# Patient Record
Sex: Female | Born: 1980 | Race: White | Hispanic: No | Marital: Married | Smoking: Never smoker
Health system: Southern US, Community
[De-identification: ages and names within clinical notes are randomized; demographics above are authoritative.]

## PROBLEM LIST (undated history)

## (undated) DIAGNOSIS — F32A Depression, unspecified: Secondary | ICD-10-CM

## (undated) DIAGNOSIS — G4733 Obstructive sleep apnea (adult) (pediatric): Secondary | ICD-10-CM

## (undated) DIAGNOSIS — M797 Fibromyalgia: Secondary | ICD-10-CM

## (undated) DIAGNOSIS — F419 Anxiety disorder, unspecified: Secondary | ICD-10-CM

## (undated) DIAGNOSIS — Z9989 Dependence on other enabling machines and devices: Secondary | ICD-10-CM

## (undated) DIAGNOSIS — I10 Essential (primary) hypertension: Secondary | ICD-10-CM

## (undated) DIAGNOSIS — G4711 Idiopathic hypersomnia with long sleep time: Secondary | ICD-10-CM

## (undated) DIAGNOSIS — E039 Hypothyroidism, unspecified: Secondary | ICD-10-CM

## (undated) DIAGNOSIS — D649 Anemia, unspecified: Secondary | ICD-10-CM

## (undated) DIAGNOSIS — H409 Unspecified glaucoma: Secondary | ICD-10-CM

## (undated) DIAGNOSIS — R7309 Other abnormal glucose: Secondary | ICD-10-CM

## (undated) DIAGNOSIS — O1490 Unspecified pre-eclampsia, unspecified trimester: Secondary | ICD-10-CM

## (undated) DIAGNOSIS — B009 Herpesviral infection, unspecified: Secondary | ICD-10-CM

## (undated) DIAGNOSIS — K219 Gastro-esophageal reflux disease without esophagitis: Secondary | ICD-10-CM

## (undated) DIAGNOSIS — F329 Major depressive disorder, single episode, unspecified: Secondary | ICD-10-CM

## (undated) HISTORY — DX: Anemia, unspecified: D64.9

## (undated) HISTORY — PX: TONSILLECTOMY: SUR1361

## (undated) HISTORY — DX: Unspecified glaucoma: H40.9

## (undated) HISTORY — PX: WISDOM TOOTH EXTRACTION: SHX21

## (undated) HISTORY — DX: Essential (primary) hypertension: I10

## (undated) HISTORY — DX: Obstructive sleep apnea (adult) (pediatric): G47.33

## (undated) HISTORY — DX: Idiopathic hypersomnia with long sleep time: G47.11

## (undated) HISTORY — DX: Major depressive disorder, single episode, unspecified: F32.9

## (undated) HISTORY — DX: Hypothyroidism, unspecified: E03.9

## (undated) HISTORY — DX: Gastro-esophageal reflux disease without esophagitis: K21.9

## (undated) HISTORY — DX: Other abnormal glucose: R73.09

## (undated) HISTORY — DX: Unspecified pre-eclampsia, unspecified trimester: O14.90

## (undated) HISTORY — DX: Fibromyalgia: M79.7

## (undated) HISTORY — PX: OTHER SURGICAL HISTORY: SHX169

## (undated) HISTORY — DX: Anxiety disorder, unspecified: F41.9

## (undated) HISTORY — DX: Dependence on other enabling machines and devices: Z99.89

## (undated) HISTORY — DX: Depression, unspecified: F32.A

## (undated) HISTORY — DX: Herpesviral infection, unspecified: B00.9

---

## 2001-04-10 ENCOUNTER — Encounter: Payer: Self-pay | Admitting: Family Medicine

## 2001-04-10 ENCOUNTER — Encounter: Admission: RE | Admit: 2001-04-10 | Discharge: 2001-04-10 | Payer: Self-pay | Admitting: Family Medicine

## 2001-07-04 ENCOUNTER — Other Ambulatory Visit: Admission: RE | Admit: 2001-07-04 | Discharge: 2001-07-04 | Payer: Self-pay | Admitting: Obstetrics and Gynecology

## 2003-08-26 ENCOUNTER — Other Ambulatory Visit: Admission: RE | Admit: 2003-08-26 | Discharge: 2003-08-26 | Payer: Self-pay | Admitting: Obstetrics and Gynecology

## 2004-08-15 ENCOUNTER — Emergency Department (HOSPITAL_COMMUNITY): Admission: EM | Admit: 2004-08-15 | Discharge: 2004-08-15 | Payer: Self-pay | Admitting: Emergency Medicine

## 2004-10-21 ENCOUNTER — Other Ambulatory Visit: Admission: RE | Admit: 2004-10-21 | Discharge: 2004-10-21 | Payer: Self-pay | Admitting: Obstetrics and Gynecology

## 2004-11-21 HISTORY — PX: CHOLECYSTECTOMY: SHX55

## 2005-07-11 ENCOUNTER — Inpatient Hospital Stay (HOSPITAL_COMMUNITY): Admission: AD | Admit: 2005-07-11 | Discharge: 2005-07-13 | Payer: Self-pay | Admitting: Obstetrics and Gynecology

## 2005-07-18 ENCOUNTER — Inpatient Hospital Stay (HOSPITAL_COMMUNITY): Admission: AD | Admit: 2005-07-18 | Discharge: 2005-07-21 | Payer: Self-pay | Admitting: Obstetrics and Gynecology

## 2005-07-28 ENCOUNTER — Inpatient Hospital Stay (HOSPITAL_COMMUNITY): Admission: AD | Admit: 2005-07-28 | Discharge: 2005-07-31 | Payer: Self-pay | Admitting: Obstetrics and Gynecology

## 2005-08-04 ENCOUNTER — Encounter: Admission: RE | Admit: 2005-08-04 | Discharge: 2005-08-04 | Payer: Self-pay | Admitting: Obstetrics and Gynecology

## 2005-08-06 ENCOUNTER — Inpatient Hospital Stay (HOSPITAL_COMMUNITY): Admission: AD | Admit: 2005-08-06 | Discharge: 2005-08-07 | Payer: Self-pay | Admitting: Obstetrics and Gynecology

## 2005-09-04 ENCOUNTER — Emergency Department (HOSPITAL_COMMUNITY): Admission: EM | Admit: 2005-09-04 | Discharge: 2005-09-05 | Payer: Self-pay | Admitting: Family Medicine

## 2005-09-28 ENCOUNTER — Encounter (INDEPENDENT_AMBULATORY_CARE_PROVIDER_SITE_OTHER): Payer: Self-pay | Admitting: General Surgery

## 2005-09-28 ENCOUNTER — Observation Stay (HOSPITAL_COMMUNITY): Admission: RE | Admit: 2005-09-28 | Discharge: 2005-09-29 | Payer: Self-pay | Admitting: General Surgery

## 2005-10-24 ENCOUNTER — Other Ambulatory Visit: Admission: RE | Admit: 2005-10-24 | Discharge: 2005-10-24 | Payer: Self-pay | Admitting: Obstetrics and Gynecology

## 2006-03-21 ENCOUNTER — Other Ambulatory Visit: Admission: RE | Admit: 2006-03-21 | Discharge: 2006-03-21 | Payer: Self-pay | Admitting: Obstetrics and Gynecology

## 2009-01-18 ENCOUNTER — Inpatient Hospital Stay (HOSPITAL_COMMUNITY): Admission: AD | Admit: 2009-01-18 | Discharge: 2009-01-18 | Payer: Self-pay | Admitting: Obstetrics and Gynecology

## 2009-04-27 ENCOUNTER — Inpatient Hospital Stay (HOSPITAL_COMMUNITY): Admission: AD | Admit: 2009-04-27 | Discharge: 2009-04-27 | Payer: Self-pay | Admitting: Obstetrics and Gynecology

## 2009-05-17 ENCOUNTER — Inpatient Hospital Stay (HOSPITAL_COMMUNITY): Admission: AD | Admit: 2009-05-17 | Discharge: 2009-05-22 | Payer: Self-pay | Admitting: Obstetrics and Gynecology

## 2009-05-27 ENCOUNTER — Encounter: Payer: Self-pay | Admitting: Emergency Medicine

## 2009-05-27 ENCOUNTER — Inpatient Hospital Stay (HOSPITAL_COMMUNITY): Admission: AD | Admit: 2009-05-27 | Discharge: 2009-05-29 | Payer: Self-pay | Admitting: Obstetrics and Gynecology

## 2009-09-12 ENCOUNTER — Emergency Department (HOSPITAL_COMMUNITY): Admission: EM | Admit: 2009-09-12 | Discharge: 2009-09-12 | Payer: Self-pay | Admitting: Emergency Medicine

## 2009-09-15 ENCOUNTER — Encounter: Admission: RE | Admit: 2009-09-15 | Discharge: 2009-09-15 | Payer: Self-pay | Admitting: Family Medicine

## 2010-07-28 ENCOUNTER — Emergency Department (HOSPITAL_COMMUNITY): Admission: EM | Admit: 2010-07-28 | Discharge: 2010-07-28 | Payer: Self-pay | Admitting: Emergency Medicine

## 2010-08-18 ENCOUNTER — Emergency Department (HOSPITAL_COMMUNITY)
Admission: EM | Admit: 2010-08-18 | Discharge: 2010-08-18 | Payer: Self-pay | Source: Home / Self Care | Admitting: Emergency Medicine

## 2011-02-03 LAB — RAPID URINE DRUG SCREEN, HOSP PERFORMED
Amphetamines: NOT DETECTED
Barbiturates: NOT DETECTED
Benzodiazepines: NOT DETECTED

## 2011-02-03 LAB — URINALYSIS, ROUTINE W REFLEX MICROSCOPIC
Bilirubin Urine: NEGATIVE
Glucose, UA: NEGATIVE mg/dL
Hgb urine dipstick: NEGATIVE
Ketones, ur: NEGATIVE mg/dL
pH: 7.5 (ref 5.0–8.0)

## 2011-02-03 LAB — POCT PREGNANCY, URINE: Preg Test, Ur: NEGATIVE

## 2011-02-03 LAB — POCT I-STAT, CHEM 8: Creatinine, Ser: 0.7 mg/dL (ref 0.4–1.2)

## 2011-02-24 LAB — DIFFERENTIAL
Basophils Absolute: 0 10*3/uL (ref 0.0–0.1)
Eosinophils Absolute: 0 10*3/uL (ref 0.0–0.7)
Eosinophils Relative: 0 % (ref 0–5)
Lymphocytes Relative: 39 % (ref 12–46)
Lymphs Abs: 2.6 10*3/uL (ref 0.7–4.0)
Monocytes Absolute: 0.7 10*3/uL (ref 0.1–1.0)
Monocytes Relative: 11 % (ref 3–12)
Neutro Abs: 3.3 10*3/uL (ref 1.7–7.7)
Neutrophils Relative %: 49 % (ref 43–77)

## 2011-02-24 LAB — COMPREHENSIVE METABOLIC PANEL
Albumin: 3.7 g/dL (ref 3.5–5.2)
Alkaline Phosphatase: 57 U/L (ref 39–117)
BUN: 11 mg/dL (ref 6–23)
Chloride: 107 mEq/L (ref 96–112)
Glucose, Bld: 92 mg/dL (ref 70–99)
Potassium: 3.9 mEq/L (ref 3.5–5.1)
Total Bilirubin: 0.6 mg/dL (ref 0.3–1.2)

## 2011-02-24 LAB — POCT CARDIAC MARKERS
CKMB, poc: <1 ng/mL — ABNORMAL LOW (ref 1.0–8.0)
Troponin i, poc: <0.05 ng/mL (ref 0.00–0.09)

## 2011-02-24 LAB — URINALYSIS, ROUTINE W REFLEX MICROSCOPIC
Bilirubin Urine: NEGATIVE
Hgb urine dipstick: NEGATIVE
Ketones, ur: NEGATIVE mg/dL
Protein, ur: NEGATIVE mg/dL
Specific Gravity, Urine: 1.02 (ref 1.005–1.030)
Urobilinogen, UA: 0.2 mg/dL (ref 0.0–1.0)

## 2011-02-24 LAB — CBC
HCT: 40.4 % (ref 36.0–46.0)
RBC: 4.84 MIL/uL (ref 3.87–5.11)

## 2011-02-27 LAB — HEPATIC FUNCTION PANEL
ALT: 19 U/L (ref 0–35)
Albumin: 2.7 g/dL — ABNORMAL LOW (ref 3.5–5.2)
Indirect Bilirubin: 0.2 mg/dL — ABNORMAL LOW (ref 0.3–0.9)
Total Protein: 5.6 g/dL — ABNORMAL LOW (ref 6.0–8.3)

## 2011-02-27 LAB — COMPREHENSIVE METABOLIC PANEL
ALT: 18 U/L (ref 0–35)
ALT: 19 U/L (ref 0–35)
Alkaline Phosphatase: 103 U/L (ref 39–117)
Alkaline Phosphatase: 94 U/L (ref 39–117)
BUN: 6 mg/dL (ref 6–23)
CO2: 25 mEq/L (ref 19–32)
CO2: 29 mEq/L (ref 19–32)
Calcium: 6.7 mg/dL — ABNORMAL LOW (ref 8.4–10.5)
Chloride: 101 mEq/L (ref 96–112)
Creatinine, Ser: 0.6 mg/dL (ref 0.4–1.2)
GFR calc non Af Amer: >60 mL/min (ref >60)
Glucose, Bld: 90 mg/dL (ref 70–99)
Potassium: 3.4 mEq/L — ABNORMAL LOW (ref 3.5–5.1)

## 2011-02-27 LAB — CBC
HCT: 30 % — ABNORMAL LOW (ref 36.0–46.0)
HCT: 32.7 % — ABNORMAL LOW (ref 36.0–46.0)
HCT: 34.1 % — ABNORMAL LOW (ref 36.0–46.0)
HCT: 35.3 % — ABNORMAL LOW (ref 36.0–46.0)
Hemoglobin: 12.4 g/dL (ref 12.0–15.0)
MCHC: 34.7 g/dL (ref 30.0–36.0)
MCHC: 35 g/dL (ref 30.0–36.0)
MCV: 88.5 fL (ref 78.0–100.0)
MCV: 90 fL (ref 78.0–100.0)
Platelets: 250 10*3/uL (ref 150–400)
Platelets: 269 10*3/uL (ref 150–400)
RBC: 3.33 MIL/uL — ABNORMAL LOW (ref 3.87–5.11)
RBC: 3.81 MIL/uL — ABNORMAL LOW (ref 3.87–5.11)
RDW: 13.5 % (ref 11.5–15.5)
RDW: 13.9 % (ref 11.5–15.5)
WBC: 7.3 10*3/uL (ref 4.0–10.5)
WBC: 9.1 10*3/uL (ref 4.0–10.5)

## 2011-02-27 LAB — BASIC METABOLIC PANEL
BUN: 12 mg/dL (ref 6–23)
Calcium: 8.5 mg/dL (ref 8.4–10.5)
Creatinine, Ser: 0.84 mg/dL (ref 0.4–1.2)
GFR calc non Af Amer: >60 mL/min (ref >60)
Glucose, Bld: 97 mg/dL (ref 70–99)
Potassium: 3.6 mEq/L (ref 3.5–5.1)

## 2011-02-27 LAB — DIFFERENTIAL
Basophils Absolute: 0 10*3/uL (ref 0.0–0.1)
Eosinophils Absolute: 0.2 10*3/uL (ref 0.0–0.7)
Eosinophils Relative: 3 % (ref 0–5)
Lymphocytes Relative: 27 % (ref 12–46)
Neutrophils Relative %: 64 % (ref 43–77)

## 2011-02-27 LAB — URINALYSIS, ROUTINE W REFLEX MICROSCOPIC
Bilirubin Urine: NEGATIVE
Ketones, ur: NEGATIVE mg/dL
Nitrite: NEGATIVE
Urobilinogen, UA: 0.2 mg/dL (ref 0.0–1.0)

## 2011-02-27 LAB — CCBB MATERNAL DONOR DRAW

## 2011-02-27 LAB — URIC ACID
Uric Acid, Serum: 5 mg/dL (ref 2.4–7.0)
Uric Acid, Serum: 5 mg/dL (ref 2.4–7.0)

## 2011-02-27 LAB — LACTATE DEHYDROGENASE
LDH: 202 U/L (ref 94–250)
LDH: 202 U/L (ref 94–250)

## 2011-02-27 LAB — MAGNESIUM: Magnesium: 6.5 mg/dL (ref 1.5–2.5)

## 2011-02-27 LAB — TSH: TSH: 3.123 u[IU]/mL (ref 0.350–4.500)

## 2011-02-27 LAB — APTT: aPTT: 30 seconds (ref 24–37)

## 2011-02-27 LAB — PROTEIN, URINE, 24 HOUR
Protein, 24H Urine: 432 mg/d — ABNORMAL HIGH (ref 50–100)
Protein, Urine: 9 mg/dL

## 2011-02-27 LAB — PROTIME-INR: Prothrombin Time: 12.1 seconds (ref 11.6–15.2)

## 2011-02-28 ENCOUNTER — Other Ambulatory Visit (HOSPITAL_COMMUNITY): Payer: Self-pay | Admitting: Internal Medicine

## 2011-02-28 ENCOUNTER — Ambulatory Visit (HOSPITAL_COMMUNITY)
Admission: RE | Admit: 2011-02-28 | Discharge: 2011-02-28 | Disposition: A | Discharge status: Home / Self Care | Payer: BLUE CROSS/BLUE SHIELD | Source: Ambulatory Visit | Attending: Internal Medicine | Admitting: Internal Medicine

## 2011-02-28 DIAGNOSIS — R5381 Other malaise: Secondary | ICD-10-CM | POA: Insufficient documentation

## 2011-02-28 DIAGNOSIS — R109 Unspecified abdominal pain: Secondary | ICD-10-CM | POA: Insufficient documentation

## 2011-02-28 DIAGNOSIS — R112 Nausea with vomiting, unspecified: Secondary | ICD-10-CM | POA: Insufficient documentation

## 2011-02-28 LAB — COMPREHENSIVE METABOLIC PANEL
ALT: 11 U/L (ref 0–35)
ALT: 9 U/L (ref 0–35)
ALT: 9 U/L (ref 0–35)
AST: 15 U/L (ref 0–37)
AST: 15 U/L (ref 0–37)
Albumin: 2.2 g/dL — ABNORMAL LOW (ref 3.5–5.2)
Alkaline Phosphatase: 101 U/L (ref 39–117)
CO2: 24 mEq/L (ref 19–32)
CO2: 26 mEq/L (ref 19–32)
Calcium: 8.5 mg/dL (ref 8.4–10.5)
Calcium: 9.5 mg/dL (ref 8.4–10.5)
Chloride: 107 mEq/L (ref 96–112)
Creatinine, Ser: 0.61 mg/dL (ref 0.4–1.2)
GFR calc Af Amer: >60 mL/min (ref >60)
GFR calc Af Amer: >60 mL/min (ref >60)
GFR calc non Af Amer: >60 mL/min (ref >60)
GFR calc non Af Amer: >60 mL/min (ref >60)
Glucose, Bld: 100 mg/dL — ABNORMAL HIGH (ref 70–99)
Potassium: 3.1 mEq/L — ABNORMAL LOW (ref 3.5–5.1)
Sodium: 139 mEq/L (ref 135–145)
Sodium: 140 mEq/L (ref 135–145)
Total Protein: 4.8 g/dL — ABNORMAL LOW (ref 6.0–8.3)
Total Protein: 5.1 g/dL — ABNORMAL LOW (ref 6.0–8.3)

## 2011-02-28 LAB — CBC
HCT: 32.9 % — ABNORMAL LOW (ref 36.0–46.0)
HCT: 33.7 % — ABNORMAL LOW (ref 36.0–46.0)
Hemoglobin: 11 g/dL — ABNORMAL LOW (ref 12.0–15.0)
Hemoglobin: 11.6 g/dL — ABNORMAL LOW (ref 12.0–15.0)
Hemoglobin: 11.8 g/dL — ABNORMAL LOW (ref 12.0–15.0)
MCHC: 34.7 g/dL (ref 30.0–36.0)
MCHC: 34.9 g/dL (ref 30.0–36.0)
MCHC: 35 g/dL (ref 30.0–36.0)
MCHC: 35.1 g/dL (ref 30.0–36.0)
MCV: 88.9 fL (ref 78.0–100.0)
MCV: 89 fL (ref 78.0–100.0)
Platelets: 107 10*3/uL — ABNORMAL LOW (ref 150–400)
Platelets: 110 10*3/uL — ABNORMAL LOW (ref 150–400)
RBC: 3.58 MIL/uL — ABNORMAL LOW (ref 3.87–5.11)
RBC: 3.79 MIL/uL — ABNORMAL LOW (ref 3.87–5.11)
RDW: 13.7 % (ref 11.5–15.5)
RDW: 13.8 % (ref 11.5–15.5)
RDW: 14.3 % (ref 11.5–15.5)
WBC: 8 10*3/uL (ref 4.0–10.5)

## 2011-02-28 LAB — URINALYSIS, ROUTINE W REFLEX MICROSCOPIC
Bilirubin Urine: NEGATIVE
Hgb urine dipstick: NEGATIVE
Nitrite: NEGATIVE
Specific Gravity, Urine: 1.01 (ref 1.005–1.030)
Urobilinogen, UA: 0.2 mg/dL (ref 0.0–1.0)
pH: 7.5 (ref 5.0–8.0)

## 2011-02-28 LAB — LACTATE DEHYDROGENASE: LDH: 103 U/L (ref 94–250)

## 2011-02-28 LAB — PLATELET COUNT: Platelets: 107 10*3/uL — ABNORMAL LOW (ref 150–400)

## 2011-02-28 LAB — URIC ACID: Uric Acid, Serum: 4.7 mg/dL (ref 2.4–7.0)

## 2011-02-28 LAB — WET PREP, GENITAL: Yeast Wet Prep HPF POC: NONE SEEN

## 2011-02-28 LAB — URINE MICROSCOPIC-ADD ON: RBC / HPF: NONE SEEN RBC/hpf (ref <3)

## 2011-02-28 LAB — CREATININE CLEARANCE, URINE, 24 HOUR
Collection Interval-CRCL: 24 hours
Creatinine Clearance: 166 mL/min — ABNORMAL HIGH (ref 75–115)
Creatinine, 24H Ur: 1480 mg/d (ref 700–1800)
Creatinine, Urine: 59.8 mg/dL

## 2011-03-04 ENCOUNTER — Other Ambulatory Visit (HOSPITAL_COMMUNITY): Payer: Self-pay | Admitting: Obstetrics and Gynecology

## 2011-03-04 DIAGNOSIS — R102 Pelvic and perineal pain: Secondary | ICD-10-CM

## 2011-03-07 ENCOUNTER — Ambulatory Visit (HOSPITAL_COMMUNITY): Admission: RE | Admit: 2011-03-07 | Payer: BLUE CROSS/BLUE SHIELD | Source: Ambulatory Visit

## 2011-03-07 ENCOUNTER — Ambulatory Visit (HOSPITAL_COMMUNITY)
Admission: RE | Admit: 2011-03-07 | Discharge: 2011-03-07 | Disposition: A | Discharge status: Home / Self Care | Payer: BLUE CROSS/BLUE SHIELD | Source: Ambulatory Visit | Attending: Obstetrics and Gynecology | Admitting: Obstetrics and Gynecology

## 2011-03-07 DIAGNOSIS — N949 Unspecified condition associated with female genital organs and menstrual cycle: Secondary | ICD-10-CM | POA: Insufficient documentation

## 2011-03-07 DIAGNOSIS — R102 Pelvic and perineal pain: Secondary | ICD-10-CM

## 2011-03-07 DIAGNOSIS — N838 Other noninflammatory disorders of ovary, fallopian tube and broad ligament: Secondary | ICD-10-CM | POA: Insufficient documentation

## 2011-03-08 LAB — WET PREP, GENITAL

## 2011-04-05 NOTE — H&P (Signed)
Lauren [name], Lauren [name]              ACCOUNT NO.:  [id number]   MEDICAL RECORD NO.:  [id number]          PATIENT TYPE:  INP   LOCATION:  9373                          FACILITY:  [facility]   PHYSICIAN:  Osborn Coho, M.D.   DATE OF BIRTH:  10-22-1981   DATE OF ADMISSION:  05/27/2009  DATE OF DISCHARGE:                              HISTORY & PHYSICAL   Lauren [name] is a 30 year old gravida 2, para 2 seven days postpartum  who is being readmitted to treat possible preeclampsia and rule out  seizures related to a syncopal episode this morning.  This is a 23 hour  observation admission and she will be receiving magnesium sulfate  infusion and a 24 hour urine collection for protein.  Her obstetrical  history includes a previous delivery requiring readmission and treatment  of preeclampsia postpartum and a term induction of labor with this  pregnancy at 37-1/2 weeks secondary to [facility] and thrombocytopenia.  Relevant labs for today's admission include a normal preeclampsia panel  that was done at [facility] prior to transport to [facility] and a  repeat preeclampsia panel that was also normal with a white blood cell  count of 9.1, hemoglobin from 10.9 on her discharge a week ago, a  hemoglobin of 11.6 which was a change from 10.4 a week ago on discharge,  a hematocrit of 34.1 and a platelet count of 271,000 which was a change  from 107,000 a week ago.  Her potassium today is 3.7, SGOT 22, SGPT 19,  LDH 202, uric acid 5.0.  Lauren [name] also had a negative chest x-ray  and negative EKG at [facility] this morning and she is being scheduled  for a CT of the head and a spiral CT of the chest to rule out pulmonary  embolism..  Her vital signs today include temperature of 97.5 upon  arrival, pulse 56, respiratory rate 18, oxygen saturation on room air  98% and blood pressures running from the 110s up to 140s over as low as  59 up to 90s.   HISTORY OF PRESENT ILLNESS:  Lauren [name] states she got up  in the  middle of the night to feed the baby and got up to prepare a bottle and  came back into her room and sat down on her bed and then blacked out.  Her husband found her unresponsive.  She said she could hear him but she  could not respond, she could not speak and she could not move her  extremities and this lasted for approximately 30 seconds.  He took her  blood pressure and found it to be 203/100 I believe and he called the  ambulance and she was transported to [facility] where she had an initial  evaluation for preeclampsia and then transported over to [facility]  where she is having ongoing evaluation for atypical preeclampsia and  investigation of her syncopal episodes.   ALLERGIES:  Lauren [name] has no known drug allergies.  She does have a  questionable allergy to LATEX.  She has no known food allergies.   CURRENT MEDICATIONS:  1.  Prenatal vitamin.  2. Vitamin C 1000 mg a day.  3. Prozac 20 mg daily.  4. Levothyroxine 25 mcg daily.  5. Colace as needed.   PAST MEDICAL HISTORY:  She has a history of depression and is currently  on Prozac.  She has a history of fibromyalgia and is currently not on  any medication for that.  States that it has been under control during  this pregnancy.  She had a history of low thyroid and is on medication  for that.  She also has a history of ASCUS Pap and a history of  preeclampsia with the previous pregnancy.  Her previous pregnancy  resulted in the birth of a daughter at 21 weeks in August of 2006 and  current pregnancy resulted in the birth of a second daughter at 37-1/2  weeks on June 30 of this year who weighed 6 pounds 13 ounces with Apgar  scores of 6 and 9 at one and five minutes respectively.  She was  admitted with thrombocytopenia and upon discharge her platelets were  stable at 107,000.   FAMILY HISTORY:  Includes maternal grandmother and maternal aunt with  heart disease.  Maternal aunt and maternal grandmother with   hypertension.  Maternal grandmother, mother, maternal aunt, maternal  first cousin with diabetes.  Mother with hypothyroidism.  Maternal aunt  and maternal first cousin also with hypothyroid.  The patient's brother  has renal disease.  Maternal grandfather had lung and skin cancer.  Maternal grandmother had skin cancer.  Her mother has had ovarian  cancer.  Her brother and her mother are bipolar.   SOCIAL HISTORY:  The patient is married to husband, [name].  They are both Caucasian.  They are of the [place] faith.  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  Her  husband has a high school diploma and works as a Psychologist, occupational.  She does not  smoke, drink or use any street drugs.   PHYSICAL ASSESSMENT:  Upon admission was within normal limits.  She is  alert and oriented x3.  She states her headache was reduced to 1/10 on a  10 point scale having been 3 upon arrival.  The chest pain that she was  feeling when she first was taken away by the ambulance is resolved and  she does state that she gets chest pain with anxiety.  She has minimal  edema of her extremities.  Her DTRs are +1 to +2 bilaterally.  She has  negative Homans.  Her lungs are clear to auscultation.  Her breasts are  full, she has been pumping and feeding her baby with a bottle.  Her  nipples are intact.  Her abdomen is soft.  Her fundus is firm below the  umbilicus.  Her perineum and vulva are intact.  Her lochia is moderate  to light.  Her vital signs were previously mentioned in the lab section.   ASSESSMENT:  A 30 year old gravida 2, para 2 seven days post spontaneous  vaginal delivery, syncope, ongoing depression with management by Prozac,  history of thrombocytopenia resolved, history of fibromyalgia stable,  history of hypothyroid, stable on medication, history of preeclampsia.   PLAN:  Per Dr. Normand Sloop admit for 23 hour observation, 24 hour urine  collection, CT of the head, spiral CT  of the chest to rule out pulmonary  embolism.  Pending those results either an inpatient or outpatient neuro  consult as indicated.  Repeat labs in  the a.m. including CBC, basic  metabolic panel, magnesium level, routine pain medication.  Continue  current medications for thyroid and depression.  Continuous pulse  oximetry and reevaluate after results of a head CT, spiral CT, repeat  labs and 24 hour urine results.      Eulogio Bear, CNM      Osborn Coho, M.D.  Electronically Signed    JM/MEDQ  D:  05/27/2009  T:  05/27/2009  Job:  [id number]

## 2011-04-05 NOTE — H&P (Signed)
[name], [name]              ACCOUNT NO.:  [id number]   MEDICAL RECORD NO.:  [id number]          PATIENT TYPE:  INP   LOCATION:  9172                          [facility]ILITY:  [facility]   PHYSICIAN:  Hal Morales, M.D.DATE OF BIRTH:  Dec 05, 1980   DATE OF ADMISSION:  05/17/2009  DATE OF DISCHARGE:                              HISTORY & PHYSICAL   Lauren [name] is a 30 year old gravida 2, para 1-0-0-1 at 37-2/7 weeks  who presented complaining of temporal headache, visual symptoms, and  feeling bad today with a blood pressure of 150/100 at home.  She also  now complains of mild epigastric and reflux-type pain.  She reports  positive fetal movement.  Denies uterine contractions.  Reports headache  is 3/10 to 4/10.   PREGNANCIES:  1. History of preeclampsia with her last pregnancy.  2. Positive group B strep.  3. Hypothyroidism.  4. Fibromyalgia.  5. History of depression.  6. Atypical squamous cells on Pap smear in 2008 with normal Pap smear      in February 2009.   While in the maternity admissions unit, the patient was noted to have  blood pressures in the 120-140s over 73-99, and platelet count was  126,000.  She was, therefore, admitted for observation and to initiate a  24 hours urine.   PRENATAL LABS:  Blood type is B positive.  Rh antibody negative.  Rubella titer positive.  Hepatitis B surface antigen negative.  HIV  nonreactive.  TSH normal at 4.437 in the first trimester, GC and  chlamydia negative, Pap smear normal in February.  Platelet count on new  OB visit in December was 200.  Hemoglobin upon entering the practice was  12.9.  It was 11.5 at 28 weeks.  TSH at that time was 2.703, RPR was  nonreactive with Glucola 130.  Group B strep culture was positive at 36  weeks.  Cultures were negative.  The patient declined first trimester  screen.  She declined AFP.   HISTORY OF PRESENT PREGNANCY:  The patient entered care at approximately  7 weeks.  She underwent  ultrasound at that time for viability with an  [facility] of June 05, 2009 established.  She does have a history of  fibromyalgia.  She also had spotting with early pregnancy.  TSH was 4.4  at her first visit.  She declined first trimester screen.  She had  cramping at 16 weeks.  She had another ultrasound at 19 weeks showing  normal growth and an anterior placenta.  Pap smear was normal.  Maternity admissions unit at approximately 22 weeks for question of  leak.  No issues were noted.  She had a Glucola that was normal at 30.  She had some back pain at 25 weeks.  It was probably sciatica.  She was  referred to PT at that time.  Fetal fibrinogen done at 27 weeks for some  cramping.  This was negative.  Hemoglobin was 11.5 at 23 weeks, TSH  2.703, RPR nonreactive.  GC and chlamydia cultures negative at 34 weeks.  Fetal fibrinogen was also negative at that time.  She was seen at  maternity admissions unit on June 8 for questionable leaking.  She had  an ultrasound at that time showing normal fluid and no other issues.  Her blood pressure has been normotensive throughout this whole  pregnancy.   OB HISTORY:  In 2006 she had the vaginal birth of a female infant weight  6 pounds 6 ounces at 37-1/7 weeks.  She was in labor 7-1/2 hours.  She  had epidural anesthesia.  By 37 weeks, last pregnancy, she was 3 cm.  She was delivered by Dr. Estanislado Pandy and required magnesium postpartum and a  readmission for preeclampsia.  She did have first trimester bleeding in  that pregnancy.   MEDICAL HISTORY:  She reports previous abnormal Pap smear in 2008.  Repeat was normal.  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  She has been on Ortho Tri-Cyclen and oral contraceptive user.  She has a  history of hypothyroidism and has been on levothyroxine.  She has a  history of constipation and occasional UTI.   SURGICAL HISTORY:  Includes wisdom teeth and a 2006 cholecystectomy.  She was hospitalized for 1 week  post delivery following the last birth.  She had preeclampsia.  The patient has no known drug allergies.  Back in  college, she had 1 episode of sensitivity to latex, but no other  problems, and has had no problems since.   FAMILY HISTORY:  Maternal grandmother and maternal aunt have heart  disease.  Maternal aunt and maternal grandmother have hypertension.  Maternal grandmother, mother, maternal aunt, maternal first cousin have  diabetes.  Her mother has hypothyroidism.  Maternal aunt and maternal  first cousin are also hypothyroid.  The patient's brother has renal  disease.  Maternal grandfather had lung and skin cancer.  Maternal  grandmother had skin cancer.  Her mother has had ovarian cancer.  Her  brother and her mother are bipolar.   GENETIC HISTORY:  Remarkable for the patient's maternal aunt born  missing 3 of her fingers.   SOCIAL HISTORY:  The patient is married to the father of the baby.  He  is involved and supportive.  His name is [employer]  The patient is  Caucasian of the [place] faith.  She has a bachelor's degree.  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  Her husband has a high school diploma.  He is a Psychologist, occupational.  She has been followed by the certified nurse midwife  service at [facility].  She denies any alcohol, drug, or  tobacco use during this pregnancy.   PHYSICAL EXAM:  VITAL SIGNS:  Blood pressures today were 127-141 over 73-  99.  max was 141/99.  Pulse was 81-107.  Respirations 20.  The patient  is afebrile.  HEENT:  Within normal limits.  Pupils equal, round, and reactive to  light.  HEART:  Regular rate and rhythm without murmur.  BREASTS:  Soft and nontender.  ABDOMEN:  Gravid and nontender, although the patient does complain of  some mild upper abdominal soreness.  Fetal heart rate is reactive with  no declarations and no contractions.  Cervix is closed, long, vertex -3.  There is positive vertex to Leopold's as well.  EXTREMITIES:   Deep tendon reflexes are 2+ without clonus.  There is 1+  edema noted.   LABORATORY STUDIES:  Hemoglobin is 11.8, white blood cell count 8.8,  platelet count 126,000.  Urinalysis shows specific gravity 1.010 and  negative protein.  Comprehensive metabolic panel is within normal  limits.  SGOT is 18, SGPT 9, LDH 103, uric acid 4.7.   IMPRESSION:  1. Intrauterine pregnancy at 37-2/7 weeks.  2. Complaining of headache, symptomatology of headache, mild      epigastric pain.  3. Slight thrombocytopenia.  4. History of preeclampsia with her first pregnancy.   PLAN:  1. Admit to birthing suite per consult Dr. Pennie Rushing, attending      physician.  2. NST q.8.  3. A 24 hours urine to begin now for protein, creatinine, and      creatinine clearance.  4. Repeat PIH labs in the morning.  5. The patient understands that, should blood pressure worsen or      preeclampsia be identified by the 24 hours urine report, then the      plan of care may include induction.  The patient and her husband      seem to understand these issues and wish to proceed with      observation.      Lauren [name], C.N.M.      Hal Morales, M.D.  Electronically Signed    VLL/MEDQ  D:  05/17/2009  T:  05/17/2009  Job:  [id number]

## 2011-04-05 NOTE — Discharge Summary (Signed)
NAMEIVIANNA, Lauren [name]              ACCOUNT NO.:  [id number]   MEDICAL RECORD NO.:  [id number]          PATIENT TYPE:  INP   LOCATION:  9316                          [facility]:  [facility]   PHYSICIAN:  Janine Limbo, M.D.DATE OF BIRTH:  Sep 21, 1981   DATE OF ADMISSION:  05/27/2009  DATE OF DISCHARGE:  05/29/2009                               DISCHARGE SUMMARY   ATTENDING PHYSICIAN:  Janine Limbo, MD   ADMITTING DIAGNOSES:  1. Seven days postpartum.  2. History of pregnancy-induced hypertension this pregnancy.  3. History of preeclampsia and postpartum preeclampsia last pregnancy.  4. Syncopal episode, rule out eclamptic seizure.   DISCHARGE DIAGNOSES:  1. Postpartum day #8.  2. Postpartum preeclampsia, resolving.   PROCEDURE:  Magnesium sulfate therapy.   HOSPITAL COURSE:  Lauren [name] is a 30 year old gravida 2, para 2-0-0-2,  who presented in transfer from [facility] to Maternity  Admissions Unit on the morning of May 27, 2009, status post a  questionable syncopal episode versus eclamptic seizure at home.  The  patient's history had been remarkable for mild elevations of blood  pressure and some mild thrombocytopenia with her labor.  She was  admitted for induction on June 27 and had a vaginal delivery on June 30.  She did not require any magnesium sulfate during her labor or  postpartum.  She was sent home with clear instructions to observe for  any postpartum signs of preeclampsia or PIH due to her previous history.   The patient had presented to [facility] on the morning of May 27, 2009, status post a questionable syncopal episode versus a seizure.  During the night, her husband had taken a blood pressure at home and it  was elevated, but then vital signs were stable in the emergency room.  In light of her history, she was transported to [facility] for  further evaluation.  On arrival, she had had a negative chest x-ray and  negative EKG.  Her  LFTs were within normal limits.  Her blood pressures  were in the 119s-130s over 50s-70s.  Her initial labs at [facility] were  also within normal limits and platelet count was 271.  Her physical exam  was within normal limits.  The decision was made to admit her for a 24-  hour urine, to place her on magnesium in light of her previous history  and history of PIH and thrombocytopenia.  During this labor, 24-hour  urine was begun.  Head CT was done to rule out bleeding.  Spiral CT was  to rule out a PE.  These were all negative.  During the rest of her  hospital stay, her labs remained stable including her TSH, which was  3.129.  however, she did show 432 mg of protein in a 24-hour specimen;  therefore, she was given the official diagnosis of postpartum  preeclampsia.  She had changed to bottle feeding.  She tolerated 24  hours of magnesium sulfate without difficulty.  She had no further  syncopal episodes.  Her orthostatics were stable, her hemoglobin was  normal, and her  uric acid was 5.4.  Magnesium sulfate was discontinued  on the afternoon of May 28, 2009.  Blood pressures were in the 130s/80s.   By the morning of May 29, 2009, the patient was doing well.  She denied  any PIH or preeclampsia symptoms.  She was bottle feeding.  Her vital  signs were stable.  Her blood pressures were in the 120s to 150s over 75-  89.  Other vital signs were normal.  Her weight was 169, down from 178  on July 7.  Her physical exam was within normal limits.  Deep tendon  reflexes were 2+ without clonus.  There was 1+ edema noted.  Dr.  Stefano Gaul saw the patient and she was deemed to receive full benefit of  her hospital stay and was discharged home in stable condition.   DISCHARGE INSTRUCTIONS:  The patient will continue to monitor for any  signs of PIH or preeclampsia, eclampsia.  She also will continue to  monitor for any other routine postpartum issues that may arise.   DISCHARGE MEDICATIONS:  The  patient will be started on HCTZ 25 mg 1 p.o.  daily.  She will continue her levothyroxine 25 mcg p.o. daily, Prozac 20  mg p.o. daily, prenatal vitamin 1 p.o. daily, and vitamin C daily.   DISCHARGE FOLLOWUP:  Will occur on July 16 at [facility] at  10:15 a.m. with Bluford Kaufmann certified nurse midwife and p.r.n., as  well as her routine 6-week postpartum visit.      Renaldo Reel Emilee Hero, C.N.M.      Janine Limbo, M.D.  Electronically Signed    VLL/MEDQ  D:  05/29/2009  T:  05/30/2009  Job:  [id number]

## 2011-04-08 NOTE — H&P (Signed)
NAMEDESPINA, BOAN NO.:  [id number]   MEDICAL RECORD NO.:  [id number]          PATIENT TYPE:  MAT   LOCATION:  MATC                          [facility]ILITY:  [facility]   PHYSICIAN:  Osborn Coho, M.D.   DATE OF BIRTH:  01-25-1981   DATE OF ADMISSION:  07/28/2005  DATE OF DISCHARGE:                                HISTORY & PHYSICAL   HISTORY OF PRESENT ILLNESS:  This is a 30 year old gravida 1 para 1 who is 1  week postpartum who presents with hypertension with worsening blurred  vision, dizziness, and headache. She was seen yesterday in the office and  blood pressure was 130/100. Her medication was changed from Aldomet to  labetalol, and then she presented today with worsening neurological  symptoms. Pregnancy is remarkable for [facility] with vaginal delivery on July 19, 2005. She had hypertension during labor and delivery and did not require  magnesium sulfate at that time. She also has a history of HSV,  hypothyroidism, and depression.   MEDICAL HISTORY:  Remarkable for hypothyroidism, mild depression, and HSV 2.   SURGICAL HISTORY:  Remarkable for a tonsillectomy at age 55 and wisdom teeth  as a teenager.   ALLERGIES:  None, but she does report sensitivity to LATEX CONDOMS, with  itching.   MEDICATIONS:  Prenatal vitamin, Levothroid, Wellbutrin, and labetalol.   SOCIAL HISTORY:  The patient is married to [place], who is involved and  supportive. She is of the [place] faith. 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   FAMILY HISTORY:  Remarkable for grandmother with heart disease,  hypertension, diabetes, and skin cancer. Mother with hypothyroidism and  uterine cancer and bipolar. Brother with kidney disease and bipolar,  alcohol, and substance abuse. Maternal grandfather with lung cancer.   REVIEW OF SYSTEMS:  Remarkable for blurred vision, dizziness, and headache.  Denies any epigastric pain or swelling.   OBJECTIVE DATA:  VITAL SIGNS:   Stable, blood pressure 142/105 and 140/96.  HEENT:  Within normal limits.  NEUROLOGIC:  Remarkable for DTRs 2+ and 0 beats of clonus.  BREASTS:  Soft, nontender.  ABDOMEN:  Soft and nontender. Lochia within normal limits.  EXTREMITIES:  Show no edema.   Her PIH labs done yesterday were normal and they will be repeated today. A  24-hour urine will be started.   ASSESSMENT:  1.  Status post spontaneous vaginal delivery 1 week ago.  2.  Postpartum preeclampsia.   PLAN:  1.  Per Dr. Su Hilt, we will admit her to the ICU.  2.  Magnesium sulfate administration.  3.  A 24-hour urine for protein and creatinine clearance.  4.  Routine preeclampsia care.  5.  Antihypertensive medication to be decided by Dr. Su Hilt.      Marie L. Williams, C.N.M.      Osborn Coho, M.D.  Electronically Signed    MLW/MEDQ  D:  07/28/2005  T:  07/28/2005  Job:  [id number]

## 2011-04-08 NOTE — Consult Note (Signed)
Lauren [name], Lauren [name]              ACCOUNT NO.:  [id number]   MEDICAL RECORD NO.:  [id number]          PATIENT TYPE:  MAT   LOCATION:  MATC                          FACILITY:  [facility]   PHYSICIAN:  Michael L. Reynolds, M.D.DATE OF BIRTH:  January 16, 1981   DATE OF CONSULTATION:  08/07/2005  DATE OF DISCHARGE:  08/07/2005                                   CONSULTATION   REASON FOR EVALUATION:  Blurred vision, headaches, dizziness, cognitive  difficulties postpartum.   HISTORY OF PRESENT ILLNESS:  This is the initial emergency consultation  evaluation of this 30 year old primigravida who recently delivered her term  infant on July 19, 2005. Her subsequent course was complicated by  postpartum pregnancy induced hypertension for which she was admitted to  [facility] from July 28, 2005 to July 31, 2005. She was  briefly on a magnesium drip and was discharged on Procardia. She also has  history of hypothyroidism and mild depression for which she was on  medications during her pregnancy. The patient reports that when she left the  hospital she was feeling pretty good although she had symptoms similar to  what she experienced tonight prior to coming in. Today she had recurrence of  some of her previous symptoms which included a dull headache which actually  is better now, and some blurring of her vision. She associates the blurring  of her vision to her blood pressure being up. She notes that it seems to  involve both eyes about the same. She also feels that she is not thinking as  clearly as she should. She also reports that she feels dizzy or light headed  when she attempts to stand up but denies any actual vertigo sensations. She  had a CT of her head done during her hospitalization and a subsequent MRI of  the brain performed as an outpatient, the latter of which is available for  my review. Because of the return of her symptoms she comes to the maternity  admissions unit at  [facility] to be evaluated tonight and neurologic  consultation was requested.   PAST MEDICAL HISTORY:  As above. Also remarkable for hypertension and  hypothyroidism, mild depression and herpes simplex infections.   FAMILY/SOCIAL/REVIEW OF SYSTEMS:  As outlined in the recent H&P and in the  accompanying attending notes.   MEDICATIONS:  1.  Levothroid.  2.  Wellbutrin.  3.  Prenatal vitamins.  4.  Iron.  5.  She was recently discharged on Procardia although she did not indicate      to me tonight that she was taking that.   PHYSICAL EXAMINATION:  VITAL SIGNS:  Afebrile. Blood pressure 140/100, pulse  96, respiration 16.  GENERAL:  This is a somewhat anxious but otherwise healthy appearing woman  supine, in no evident distress.  HEENT:  Cranium is normocephalic, atraumatic. Oropharynx benign.  NECK:  Supple without carotid bruits.  HEART:  Regular rate and rhythm without murmurs.  NEUROLOGIC EXAM:  Mental status - she is awake, alert and oriented to time,  place and person. Recent and remote memory are intact. Attention span and  concentration, fund of knowledge are all appropriate. Speech is fluent and  not dysarthric. She has no defects to confrontational naming and can repeat  a phrase. Mood is euthymic and affect is appropriate. Cranial nerves -  funduscopic exam is benign. Pupils equal and briskly reactive. Extraocular  movements are full without nystagmus. Visual fields are full to  confrontation. Hearing is intact to conversational speech. Facial sensation  is intact to pin prick. Face, tongue and palate move normally and  symmetrically. Shoulder shrug strength is normal. Motor testing - normal  bulk and tone, normal strength in all tested extremity muscles. Sensation -  intact to pin prick and light touch in all extremities. Coordination - rapid  movements are performed well. Finger-to-nose and heel-to-shin are performed  well. Gait - she arises easily from the chair.  She complains of dizziness  but is able to ambulate without much difficulty and can perform a Romberg  maneuver without swaying or falling. She can also tandem walk a little bit.  Reflexes are somewhat brisk but symmetric throughout. Toes are down going  bilaterally.   LABORATORY REVIEW:  MRI of the brain performed August 04, 2005 without  contrast at [facility] is personally reviewed. The study is  remarkable for minimal change in the white matter adjacent to the frontal  horns of the lateral ventricles which is nonspecific in nature. There is no  other evidence of white matter disease. No evidence of signs of thrombosis,  acute stroke, of cerebral edema or other acute finding.   IMPRESSION:  Symptoms as above, including blurred vision, headaches,  dizziness, subjective cognitive difficulty. She has normal mental status on  neurologic examination tonight and no real imaging evidence of any clear  neurologic disease. The MRI findings can represent normal variant, may be  related to recent hypertension. It could conceivably represent demyelinating  disease although that is felt to be pretty unlikely on clinical grounds. Her  blood pressure is somewhat high this evening.   RECOMMENDATIONS:  Would not work up further for neurologic disease at this  time. She probably does need ongoing treatment of her blood pressure as long  as they are elevated, and symptomatic treatment of her headaches.  Will  defer this to her managing obstetrician.      Michael L. Thad Ranger, M.D.  Electronically Signed     MLR/MEDQ  D:  08/07/2005  T:  08/07/2005  Job:  [id number]

## 2011-04-08 NOTE — Op Note (Signed)
Lauren [name], Lauren [name]              ACCOUNT NO.:  [id number]   MEDICAL RECORD NO.:  [id number]          PATIENT TYPE:  OBV   LOCATION:  A319                          FACILITY:  [facility]   PHYSICIAN:  Barbaraann Barthel, M.D. DATE OF BIRTH:  24-May-1981   DATE OF PROCEDURE:  09/28/2005  DATE OF DISCHARGE:                                 OPERATIVE REPORT   SURGEON:  Dr. Malvin Johns.   PREOPERATIVE DIAGNOSIS:  Cholecystitis, cholelithiasis   POSTOPERATIVE DIAGNOSIS:  Cholecystitis, cholelithiasis   PROCEDURE:  Laparoscopic cholecystectomy.   SPECIMEN:  Gallbladder.   NOTE:  This is a 30 year old white female who had approximately four-week  history of recurrent right upper quadrant pain and nausea and vomiting. A  sonogram revealed cholelithiasis. Liver function studies showed that  bilirubin was grossly within normal limits with a mildly elevated SGOT and  SGPT at 48 and 45 respectively. Amylase was not elevated. We had discussed  elective cholecystectomy with her in detail. We discussed laparoscopic  approach, discussing complications not limited to but including bleeding,  infection, damage to bile duct, perforation of organs and transitory  diarrhea. Informed consent was obtained.   GROSS OPERATIVE FINDINGS:  The patient had lesions around Hartmann's pouch  and the gallbladder, a small cystic duct which was not cannulated. The right  upper quadrant otherwise appeared to be normal. There were small stones  within the gallbladder.   TECHNIQUE:  The patient was placed in supine position after the adequate  administration of general anesthesia via endotracheal intubation, her entire  was prepped with Betadine solution and draped in the usual manner. Following  aseptic technique, a Foley catheter was placed with the patient in  Trendelenburg. A periumbilical incision was carried out over the superior  aspect of the umbilicus. A sharp towel clip was used to grasp and elevate  the fascia,  and the Veress needle was inserted and confirmed in position  with a saline drop test. The abdomen was then insufflated with approximately  3.5 liters of CO2. Using the Visiport technique, then placed an 11-mm  cannula in the umbilicus area and then under direct vision placed an 11-mm  cannulae in the epigastrium and two 5-mm cannulas in the right upper  quadrant laterally. The gallbladder was grasped. Its adhesions were taken  down. The cystic duct was clearly visualized, triply silver clipped on the  side of the common bile duct and singly silver clipped on the side of the  gallbladder. Likewise, the cystic artery was triply silver clipped and  divided. The gallbladder was then removed using the hook cautery device  uneventfully. I removed this with the EndoCatch device. I elected to leave a  Jackson-Pratt drain in the liver bed and used a piece of Surgicel for the of  the liver bed. I irrigated after checking for hemostasis. The abdomen was  then desufflated, and the drain was sutured in place with 3-0 nylon. The  port sites were anesthetized with 1/2% Sensorcaine without epinephrine to  help with postoperative comfort. Fascia was closed  in the area of the epigastrium and the umbilicus with 0 Polysorb sutures,  and all skin incisions were closed with a stapling device. Prior to closure,  all sponge, needle and instrument counts were found to be correct. Estimated  blood loss was minimal. The patient received a liter of crystalloids  intraoperatively, and there were no complications.      Barbaraann Barthel, M.D.  Electronically Signed     WB/MEDQ  D:  09/28/2005  T:  09/28/2005  Job:  [id number]   cc:   Bryan Lemma. Manus Gunning, M.D.  Fax: [phone]

## 2011-04-08 NOTE — Discharge Summary (Signed)
[name], Lauren [name]              ACCOUNT NO.:  [id number]   MEDICAL RECORD NO.:  [id number]          PATIENT TYPE:  INP   LOCATION:  9372                          FACILITY:  [facility]   PHYSICIAN:  Dois Davenport A. Rivard, M.D. DATE OF BIRTH:  September 11, 1981   DATE OF ADMISSION:  07/28/2005  DATE OF DISCHARGE:  07/31/2005                                 DISCHARGE SUMMARY   ADMITTING DIAGNOSES:  1.  Status post vaginal delivery on July 19, 2005.  2.  Postpartum preeclampsia.   DISCHARGE DIAGNOSIS:  Postpartum hypertension with headache, preeclampsia  ruled out.   [name] is a 30 year old para 1-0-0-1 who presents 1 week postpartum  with elevated blood pressures and worsening blurred vision, dizziness, and  headache. Her blood pressures on evaluation were 130/100, 142/105, 140/96.  She was admitted for administration of magnesium sulfate and a 24-hour urine  was begun. The 24-hour urine showed no evidence of preeclampsia and her  primary PIH labs were all remained within normal limits. Her CBC showed a  hemoglobin of 12.5. Her blood pressures did remain labile throughout her  admission until Procardia XL 60 mg p.o. q.a.m. was begun. She was discharged  on this medication. She did have evaluation of her headaches. A CT scan was  performed on July 30, 2005 without contrast. 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  MRI follow-up was recommended on an outpatient basis. The patient's blood  pressures on July 31, 2005 were 110 to 130s over 70s to 90s. Her weight  was down 5 pounds since admission. She had no headache that morning  following administration of Phenergan the evening before, and the patient  slept well. Her discharge instructions were reviewed with her. She was  discharged to home with Procardia XL 60 mg p.o. daily, 25 mg of Phenergan  p.o. p.r.n., and Motrin 600 mg p.o. q.6h. p.r.n. She was to follow up at the  office of [facility] for a blood pressure check in 48 hours, and to call for any  headache, visual changes or epigastric pain; any increased pain or problems  or concerns of any kind. She will follow up with Dr. Gennette Pac,  neuroradiologist, for an outpatient head MRI.      Rica Koyanagi, C.N.M.      Crist Fat Rivard, M.D.  Electronically Signed    SDM/MEDQ  D:  08/02/2005  T:  08/02/2005  Job:  [id number]

## 2011-04-08 NOTE — H&P (Signed)
NAMELEXXUS, UNDERHILL              ACCOUNT NO.:  [id number]   MEDICAL RECORD NO.:  [id number]          PATIENT TYPE:  INP   LOCATION:  9167                          [facility]ACILITY:  [facility]   PHYSICIAN:  Dois Davenport A. Rivard, M.D. DATE OF BIRTH:  [birth year]   DATE OF ADMISSION:  07/18/2005  DATE OF DISCHARGE:                                HISTORY & PHYSICAL   HISTORY OF PRESENT ILLNESS:  Lauren [name] is a 30 year old married white female,  gravida 1, para 0-0-0-0, who is admitted today at 66 weeks' gestation for  induction of labor, secondary to worsening pregnancy-induced hypertension  and possible preeclampsia.  The patient's last menstrual period was November 01, 2004.  The patient's best [facility] is August 08, 2005.  The patient was  directly admitted from the office to birthing suite.  The patient has  complaints of headache and some visual symptoms for the past few days.  The  patient was discharged from [facility] [place] on Wednesday,  August 23, after admission on July 11, 2005, for evaluation of PIH with  negative proteinuria at that time.  The patient was started on Aldomet 500  mg b.i.d. prior to discharge.  The patient was seen in follow-up at the  office today.  The patient reports that her baby is moving well.  The  patient denies any right upper quadrant pain or increased edema.  The  patient denies any rupture of membranes or vaginal bleeding.  The patient  reports that she has noted some irregular contractions throughout the  weekend.  At the time of the patient's office visit today, the patient was  noted to have elevated blood pressure as well as protein in the urine on dip  today.  The patient's cervix at the office today was 3 cm, 80% effaced, and  -1 station.  At the time of the patient's previous admission, the patient's  [facility] labs were negative.  The patient's pregnancy has been significant for  first trimester spotting, hypothyroidism, history of irregular menses,  as  well as possible HSV with negative cultures but positive serum type  obtained.  The patient's blood pressures have been normal until 36 weeks of  gestation.  The patient's pregnancy was complicated by hypothyroidism.   The patient's pregnancy with first trimester spotting was followed with  quantitative HCGs and first trimester ultrasound.  The patient underwent  ultrasound at 6 weeks' gestation with findings of an intrauterine pregnancy  with fetal heart tones.  The patient underwent ultrasound at 18 weeks 2  days' gestation which revealed normal fetal anatomy with the exception of  limited four chamber heart views and cervix of 3.5 cm.  Size was equal to  dates at that time.  Placenta anterior.  The patient subsequently called  with questionable vaginal bumps on her labia at 20 weeks' gestation.  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  The patient's husband is with a history of one HSV lesion  in the past.  The patient had repeat ultrasound done, which revealed a  normal four chamber heart.  The patient was with exposure to Parva virus and  underwent serum testing, which revealed immunity to Parva virus.  The  patient's Glucola screen returned with a value of 140.  The  patient  underwent a 3 hour glucose tolerance test which revealed normal values of  76, 131, 107, and 102, respectively.  The patient also was started on iron  to the time of her hemoglobin at 28 weeks, secondary to hemoglobin of 10.6.  The patient's RPR was nonreactive at the time of her 28-week visit.  The  patient also experienced poison ivy which resolved spontaneously at 31  weeks.  GBS cultures were obtained at 36 weeks and gonorrhea and Chlamydia  cultures were declined at 36 weeks.  Again as noted above, the patient was  admitted at 36 weeks for serial blood pressures and PIH labs and discharged  on August 23 after admission on August 21.    LABORATORY DATA:  Prenatal labs:  Initial hemoglobin 12.7, hematocrit 27.4.  Prenatal visit:  Platelets 200,000.  Blood type B positive.  Antibody screen  negative.  RPR nonreactive at initial prenatal.  Rubella titer immune.  Hepatitis B surface antigen negative.  HIV was declined.  TSH was within  normal limits.  Pap test was negative in December of 2005.  Gonorrhea and  Chlamydia were negative, January 2006.  Quad screen was declined.  One hour  Glucola had a value of 140.  Three hour Glucola values were 76, 131, 107,  102.  A 28-week hemoglobin was 10.6.  RPR at 28 weeks was nonreactive.  Group B Strep is negative.   PAST MEDICAL HISTORY:  1.  Significant for hypothyroidism.  2.  Mild depression.  3.  HSV type 2 per serum.   PAST SURGICAL HISTORY:  1.  Tonsillectomy at age 75.  2.  Wisdom teeth as a teenager.   ALLERGIES:  No known drug allergies.  The patient does report that she is  sensitive to LATEX CONDOMS with itching noted.   CURRENT MEDICATIONS:  1.  Prenatal vitamin one tab daily.  2.  Iron supplement one tab daily.  3.  Levothroid 25 mcg daily.  4.  Wellbutrin XL 300 mg daily.  5.  Aldomet 500 mg twice daily.  6.  Valtrex 500 mg daily.   SOCIAL HISTORY:  The patient is married to [place] who is supportive.  The  patient is of the [place] faith.  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  The patient denies use of  tobacco or alcohol or street drugs.  The patient's domestic violence screen  is negative.   FAMILY HISTORY:  Significant for grandmother with heart disease,  hypertension, diabetes, skin cancer.  The patient's mother has  hypothyroidism, uterine cancer, bipolar disorder.  Brother with kidney  disease, bipolar disorder, history of alcohol and substance abuse.  Maternal  grandfather with lung cancer.   REVIEW OF SYSTEMS:  Negative with the exception as noted above.  PHYSICAL EXAMINATION:  VITAL SIGNS:  Blood  pressure is 140/100.  ABDOMEN:  Soft and gravid and nontender with no right upper quadrant  tenderness noted.  VAGINA:  Vaginal exam per Dr. Estanislado Pandy, 3 cm, 75% effaced, -1 station.  Fetal  heart rate tracing is reactive with irregular occasional uterine  contractions noted to be present.  EXTREMITIES:  There is 1+ edema noted to be present in the lower  extremities.  Deep  tendon reflexes are 2+ with no clonus.  If lungs are  clear to AP and auscultation, heart regular rate and rhythm without murmur  noted.  Skin is warm and dry.  Color is satisfactory.  NEUROLOGIC:  The patient is alert and oriented.  HEENT:  Within normal limits.  BREASTS:  Deferred.   LABORATORY DATA:  Labs on admission:  WBC 9.5, hemoglobin 12.6, hematocrit  36.8, platelets 212, uric acid 3.4, LDH 131, SGOT 16, SGPT 13.   ASSESSMENT:  1.  Intrauterine pregnancy at 37 weeks.  2.  Pregnancy-induced hypertension with possible preeclampsia.   PLAN:  Induction of labor was discussed with the patient by Dr. Luberta Robertson as  well as Dr. Estanislado Pandy and the patient agrees and desires to proceed.  Artificial rupture of membranes was accomplished by Dr. Estanislado Pandy with clear  fluid.  Pitocin was also begun.  Spontaneous special delivery is anticipated  and the patient will be followed carefully.  Pregnancy PIH labs are  currently negative.     ______________________________  Rhona Leavens, CNM      Crist Fat Rivard, M.D.  Electronically Signed    NOS/MEDQ  D:  07/18/2005  T:  07/18/2005  Job:  [id number]

## 2011-04-08 NOTE — H&P (Signed)
NAMEADEN, YOUNGMAN              ACCOUNT NO.:  [id number]   MEDICAL RECORD NO.:  [id number]          PATIENT TYPE:  INP   LOCATION:  9167                          [facility]ILITY[facility]   PHYSICIAN:  Dois Davenport A. Rivard, M.D. DATE OF BIRTH:  08-18-1981   DATE OF ADMISSION:  07/18/2005  DATE OF DISCHARGE:                                HISTORY & PHYSICAL   No dictation for this job.     ______________________________  Rhona Leavens, CNM      Crist Fat Rivard, M.D.     NOS/MEDQ  D:  07/18/2005  T:  07/18/2005  Job:  [id number]

## 2011-04-08 NOTE — H&P (Signed)
NAMEIOLANDA, Lauren [name]              ACCOUNT NO.:  [id number]   MEDICAL RECORD NO.:  [id number]          PATIENT TYPE:  MAT   LOCATION:  [facility]                          FACILITY:  [facility]   PHYSICIAN:  Naima A. Dillard, M.D. DATE OF BIRTH:  12/22/1980   DATE OF ADMISSION:  07/11/2005  DATE OF DISCHARGE:                                HISTORY & PHYSICAL   Ms. [name] is a 30 year old married white female, gravida 1, para 0, who  is admitted to inpatient care at 36-1/[redacted] weeks gestation for elevated blood  pressure to rule out preeclampsia.  The patient is seen at [facility] on July 11, 2005, for a routine visit and blood pressure at that time  was noted to be 130/100 with repeat of 130/102.  The patient with negative  urine protein dip at office visit.  The patient reports occasional mild  headaches over the past few weeks and occasional spots in her vision, but  nothing has been persistent or severe.  The patient denies any right upper  quadrant pain.  The patient also denies any uterine contractions, rupture of  membranes, or bleeding and reports that her fetus is very active.  She  reports that she has noted some very slight edema in her ankles, but nothing  severe.  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  No previously  elevated blood pressures are noted during the patient's prenatal course.  The patient's book-in blood pressure 110/64.  Pregnancy is significant for:  (1) First trimester spotting.  (2) History of irregular menses.  (3) History  of hypothyroidism, stable on medications.  (4) History of depression, stable  on medications.  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  The patient has been followed by the C.N.M.  service at [facility].   PRENATAL LABORATORY DATA:  Hemoglobin and hematocrit 12.7 and 37.4 at 9  weeks, platelets 200,000.  Blood  type B positive, Rh antibody screen  negative, RPR nonreactive at 9 and 28 weeks, rubella immune, hepatitis B  surface antigen negative, HIV is declined.  Gonorrhea and Chlamydia cultures  negative January of 2006 and declined at 36 weeks.  Cystic fibrosis and quad  screen declined.  TSH within normal limits at 9 weeks.  Pap within normal  limits in December of 2005.  Glucola 140 with a 3-hour Glucose Tolerance  Test results of 76, 131, 107, and 102, respectively.  23-week CBC 10.6 and  Group B Strep is pending.   HISTORY OF PRESENT PREGNANCY:  LMP November 01, 2004, which gives the  patient an estimated gestational age of [redacted] weeks and 1 day.  Ultrasound at 6-  0/7 weeks consistent with dates.  The patient with small spotting at 6  weeks.  Ultrasound at 18-2/7 weeks consistent with dates, cervix 3.5 cm,  placenta anterior, and limited evaluation of the heart at that time.  The  patient also with complaint of a labial bump at 20 weeks, HSV cultures sent  were negative,  but serum HSV titers significant for positive HSV-1.  The  patient exposed to Parvovirus at 26 weeks with immune titers post exposure.  Repeat ultrasound at 24 weeks with visualization of the fetal heart  completed.  The patient also with poison ivy at 31 weeks treated by a  primary care M.D.   OB HISTORY:  Gravida 1, as previously noted.   PAST MEDICAL HISTORY:  1.  Hypothyroidism, stable on medications.  2.  Depression, stable on medications.   CURRENT MEDICATIONS:  1.  Levothroid 25 mcg daily.  2.  Wellbutrin XL 300 mg daily.  3.  Prenatal vitamins one tablet daily.  4.  Iron supplement one tablet daily.   PAST SURGICAL HISTORY:  1.  Tonsillectomy at 30 years old.  2.  Wisdom teeth as a teenager.   ALLERGIES:  No known drug allergies.   FAMILY HISTORY:  Maternal grandmother with heart disease, hypertension, type  2 diabetes.  Maternal aunt with heart disease, hypertension, insulin-  dependent diabetes  mellitus, and hypothyroidism.  Mother with  hypothyroidism, uterine cancer.  Brother with renal disease.  Maternal  grandmother with skin cancer.  Maternal grandfather with lung cancer.  Mother, brother, and maternal grandfather with bipolar disease.  Paternal  grandfather with alcohol abuse.  Brother with alcohol and drug abuse.   GENETIC HISTORY:  Negative.   SOCIAL HISTORY:  The patient is a Pension scheme manager at [employer].  The patient is married to Busby, 32 years  old, who is involved and supportive.  Barbara Cower is a Psychologist, occupational.  The patient is of  the [place] faith.   REVIEW OF SYSTEMS:  Negative with the exception noted in the history of  present illness.   OBJECTIVE:  VITAL SIGNS:  Blood pressure on admission 160/93 with subsequent  blood pressures of 155/102, 141/103, 141/91, 132/84, 146/96, and 145/102.  O2 saturations 99% on room air.  HEENT:  Within normal limits.  LUNGS:  Within normal limits.  HEART:  Within normal limits.  BREASTS:  Deferred.  ABDOMEN:  Soft and nontender with no right upper quadrant pain noted.  Abdomen is gravid with a nontender fundus, appropriate for gestational age.  Fetal heart rate baseline 150's and reactive fetal heart rate tracing.  Uterine contractions are noted to be every 2 to 4 minutes and mild to  palpation without patient aware of UC's.  PELVIC:  Sterile vaginal examination deferred on admission and the patient  was 2 cm dilated, 70% effaced, and -2 station at [facility] on  July 11, 2005.  EXTREMITIES:  Deep tendon reflexes 2+ with no clonus, no edema is noted.  Homan's sign is negative bilaterally.   LABORATORY DATA:  Obtained at [facility] on admission;  urine protein negative and within normal limits.  WBC 11.6, hemoglobin 12.4,  hematocrit 36.4, platelets 202,000.  SGOT 17, SGPT 16, uric acid 5.0,  potassium 3.3.   ASSESSMENT: 1.  Pregnancy-induced hypertension  at [redacted] weeks gestation.  2.  History of depression and hypothyroidism.   PLAN:  1.  Consult was obtained with Dr. Normand Sloop.  2.  The patient will be admitted for inpatient care, for serial blood      pressure evaluation as well as 24-hour urine      protein collection to rule out preeclampsia.  3.  The patient will be started on Aldomet 250 mg p.o. b.i.d.  4.  The patient will be followed closely.  ______________________________  Rhona Leavens, CNM      Naima A. Normand Sloop, M.D.  Electronically Signed    NOS/MEDQ  D:  07/11/2005  T:  07/12/2005  Job:  [id number]

## 2011-04-08 NOTE — Discharge Summary (Signed)
NAMELELIANA, Lauren [name]              ACCOUNT NO.:  [id number]   MEDICAL RECORD NO.:  [id number]          PATIENT TYPE:  INP   LOCATION:  9153                          [facility]ACILITY:  [facility]   PHYSICIAN:  Hal Morales, M.D.DATE OF BIRTH:  1981-08-23   DATE OF ADMISSION:  07/11/2005  DATE OF DISCHARGE:  07/13/2005                                 DISCHARGE SUMMARY   ADMISSION DIAGNOSES:  1.  Intrauterine pregnancy at 36-1/7 weeks.  2.  Rule out preeclampsia.   DISCHARGE DIAGNOSES:  1.  Intrauterine pregnancy at 36-3/7 weeks.  2.  Pregnancy-induced hypertension.   OPERATION/PROCEDURE:  Electronic fetal monitoring, serial lab work, serial  blood pressures.   HOSPITAL COURSE:  Lauren [name] is a 30 year old, gravida 1, para 0-0-0-0,  who was admitted with elevated blood pressures noted in the office at the  time of her routine office visit on July 11, 2005.  The patient was  admitted for 24-hour urine and serial labs.  The patient has been stable  throughout her hospital stay with only occasional headache but no right  upper quadrant pain and no scotomata.  The patient was started on Aldomet  for elevated blood pressure and the patient's blood pressures have returned  to normal over the last 12-18 hours.  The patient's serial labs returned  with a 24-hour urine protein value of 188 mg.  The patient's platelet count  has been stable at 158,000.  The patient's liver functions were also within  normal limits and uric acid stable at 4.7.  The patient's fetal monitoring  has been reassuring with the patient undergoing biophysical profile on  July 13, 2005 with result of a 10 out of 10 biophysical profile.  The  patient without contractions throughout her hospital stay.  The patient's  pregnancy has been significant for:   1.  First trimester spotting.  2.  History of irregular menses.  3.  History of hypothyroidism.  4.  History of depression.  5.  History of HSV-1.  6.  Latex  sensitivity prior to admission.   As the patient's blood pressures were stable on Aldomet, it was felt that  the patient could be safely discharged to home on activity restrictions to  be followed up in the office.   CONDITION ON DISCHARGE:  Stable.   DISCHARGE INSTRUCTIONS:  Per written discharge sheet.   DISCHARGE MEDICATIONS:  1.  Aldomet 500 mg p.o. twice a day.  2.  Valtrex 500 mg p.o. daily.  3.  Levothroid 25 mcg daily.  4.  Wellbutrin XL 300 mg p.o. daily.  5.  Prenatal vitamins one daily.  6.  Iron supplement one daily.   FOLLOW UP:  The patient will return to [facility] on Monday, July 18, 2005, for return OB visit as well as non-stress test.     ______________________________  Lauren [name], CNM      Hal Morales, M.D.  Electronically Signed    NOS/MEDQ  D:  07/13/2005  T:  07/14/2005  Job:  [id number]

## 2011-04-28 ENCOUNTER — Other Ambulatory Visit: Payer: Self-pay | Admitting: Obstetrics and Gynecology

## 2011-04-28 DIAGNOSIS — N6310 Unspecified lump in the right breast, unspecified quadrant: Secondary | ICD-10-CM

## 2011-04-28 DIAGNOSIS — N644 Mastodynia: Secondary | ICD-10-CM

## 2011-05-04 ENCOUNTER — Other Ambulatory Visit: Payer: BLUE CROSS/BLUE SHIELD

## 2011-07-07 ENCOUNTER — Encounter: Payer: Self-pay | Admitting: Internal Medicine

## 2011-08-03 ENCOUNTER — Ambulatory Visit: Payer: BLUE CROSS/BLUE SHIELD | Admitting: Internal Medicine

## 2011-08-08 ENCOUNTER — Ambulatory Visit: Payer: Self-pay | Admitting: Internal Medicine

## 2011-08-17 ENCOUNTER — Other Ambulatory Visit: Payer: Self-pay | Admitting: Internal Medicine

## 2011-08-17 DIAGNOSIS — R5383 Other fatigue: Secondary | ICD-10-CM

## 2011-08-17 DIAGNOSIS — R110 Nausea: Secondary | ICD-10-CM

## 2011-08-19 ENCOUNTER — Ambulatory Visit
Admission: RE | Admit: 2011-08-19 | Discharge: 2011-08-19 | Disposition: A | Discharge status: Home / Self Care | Payer: BC Managed Care – PPO | Source: Ambulatory Visit | Attending: Internal Medicine | Admitting: Internal Medicine

## 2011-08-19 DIAGNOSIS — R110 Nausea: Secondary | ICD-10-CM

## 2011-08-19 DIAGNOSIS — R5383 Other fatigue: Secondary | ICD-10-CM

## 2011-08-19 MED ORDER — IOHEXOL 300 MG/ML  SOLN
100.0000 mL | Freq: Once | INTRAMUSCULAR | Status: AC | PRN
  Administered 2011-08-19: 100 mL via INTRAVENOUS

## 2011-08-19 MED ORDER — IOHEXOL 300 MG/ML  SOLN: 100.0000 mL | Freq: Once | INTRAMUSCULAR | Status: DC | PRN

## 2011-08-22 ENCOUNTER — Encounter: Payer: Self-pay | Admitting: Internal Medicine

## 2011-08-22 ENCOUNTER — Ambulatory Visit (INDEPENDENT_AMBULATORY_CARE_PROVIDER_SITE_OTHER): Payer: BC Managed Care – PPO | Admitting: Internal Medicine

## 2011-08-22 VITALS — BP 128/80 | HR 68 | Ht 61.0 in | Wt 176.0 lb

## 2011-08-22 DIAGNOSIS — R1030 Lower abdominal pain, unspecified: Secondary | ICD-10-CM

## 2011-08-22 DIAGNOSIS — R109 Unspecified abdominal pain: Secondary | ICD-10-CM

## 2011-08-22 DIAGNOSIS — K5900 Constipation, unspecified: Secondary | ICD-10-CM

## 2011-08-22 DIAGNOSIS — K625 Hemorrhage of anus and rectum: Secondary | ICD-10-CM

## 2011-08-22 MED ORDER — PEG-KCL-NACL-NASULF-NA ASC-C 100 G PO SOLR: 1.0000 | ORAL | Status: DC

## 2011-08-22 MED ORDER — DOCUSATE SODIUM 100 MG PO CAPS: 100.0000 mg | ORAL_CAPSULE | Freq: Every day | ORAL | Status: AC | PRN

## 2011-08-22 MED ORDER — PRAMOXINE-HC 1-1 % EX CREA: TOPICAL_CREAM | CUTANEOUS | Status: AC

## 2011-08-22 NOTE — Patient Instructions (Signed)
You have been scheduled for a Colonoscopy, instructions have been provided. Your prep, analpram and colace has been sent to your pharmacy.

## 2011-08-22 NOTE — Progress Notes (Signed)
Subjective:    Patient ID: Lauren [name], female    DOB: 1981-08-22, 30 y.o.   MRN: [id number]  HPI Lauren [name] is a 30 yo female with a PMH of hypothyroidism, anxiety/depression, hypertension, fibromyalgia, GERD and hemorrhoids who is seen in consultation at the request of Dr. Katrinka [name] for evaluation of rectal bleeding and lower abdominal pain. The patient reports intermittent rectal bleeding over the last 2-3 years, but this seems to be occurring more frequently over the last several months. She reports is associated with anorectal pain, specifically prior to and during passage of stool. She reports the blood that she sees is usually bright red and located on the stool and in the toilet tissue. She also describes some lower abdominal pain located on either side of her umbilicus. This is not always better with bowel movement. She does report intermittent constipation for which she has used stool softeners. She reports some benefit with stool softeners, though she is not using this on a daily basis. She reports frequent nausea without vomiting. She reports her appetite is "okay" and that her weight is stable to slightly increased. She denies heartburn, dysphagia or odynophagia. No melena.  She denies fecal incontinence.  She has used hydrocortisone suppositories for hemorrhoids in the past which seemed to help. She also has tried preparation H. without much benefit.  Review of Systems Constitutional: Negative for fever, chills, night sweats, activity change, appetite change and unexpected weight change HEENT: Negative for sore throat, mouth sores and trouble swallowing. Eyes: Negative for visual disturbance Respiratory: Negative chest tightness, positive for shortness of breath and intermittent coughing Cardiovascular: Negative for chest pain, palpitations and lower extremity swelling Gastrointestinal: See history of present illness Genitourinary: Negative for dysuria and hematuria, positive for  occasional urinary incontinence Musculoskeletal: Negative for back pain, arthralgias and myalgias Skin: Negative for rash or color change Neurological: Negative for weakness, numbness, positive for headaches Hematological: Negative for adenopathy, negative for easy bruising/bleeding Psychiatric/behavioral: Positive for depressed mood and for anxiety, negative SI/HI   Past Medical History  Diagnosis Date  . Depression   . Hypothyroidism   . HSV-1 infection   . Fibromyalgia   . Preeclampsia   . Hypertension   . Anxiety   . Hemorrhoids   . Constipation   . GERD (gastroesophageal reflux disease)    Current Outpatient Prescriptions  Medication Sig Dispense Refill  . ALPRAZolam (XANAX) 0.25 MG tablet Take 0.25 mg by mouth 2 (two) times daily. Take as needed      . Ascorbic Acid (VITAMIN C) 1000 MG tablet Take 1,000 mg by mouth daily.        . Cholecalciferol (VITAMIN D) 2000 UNITS CAPS Take 1 capsule by mouth daily.        . DULoxetine (CYMBALTA) 30 MG capsule Takes 90 mg once daily by mouth       . Flaxseed, Linseed, 1000 MG CAPS Take 1 capsule by mouth daily.        Marland Kitchen levothyroxine (SYNTHROID, LEVOTHROID) 88 MCG tablet Take 88 mcg by mouth daily.        . Magnesium 500 MG TABS Take by mouth.        . Multiple Vitamin (MULTIVITAMIN PO) Take 1 capsule by mouth daily.        . Multiple Vitamins-Minerals (MULTIVITAMIN WITH MINERALS) tablet Take 1 tablet by mouth daily.        . Omega-3 Fatty Acids (FISH OIL) 1000 MG CAPS Take 1 capsule by mouth 2 (two) times  daily.        . propranolol (INDERAL) 10 MG tablet Takes 10 mg in the morning and 20 mg by mouth once daily at bedtime      . Red Yeast Rice Extract (RED YEAST RICE PO) Take 1 capsule by mouth 2 (two) times daily.        Marland Kitchen docusate sodium (COLACE) 100 MG capsule Take 1 capsule (100 mg total) by mouth daily as needed for constipation. Take 1-2 daily as needed  30 capsule  1  . peg 3350 powder (MOVIPREP) 100 G SOLR Take 1 kit (100 g  total) by mouth as directed. See written handout  1 kit  0  . pramoxine-hydrocortisone (ANALPRAM HC) cream Apply to affected area 3 times daily  30 g  1   No Known Allergies  Family History  Problem Relation Age of Onset  . Heart disease Maternal Grandmother   . Diabetes Maternal Grandmother   . Hypothyroidism Mother   . Cervical cancer Mother   . Bipolar disorder Mother   . Bipolar disorder Brother   . Kidney disease Brother   . Alcohol abuse Brother   . Lung cancer Maternal Grandfather   . Heart disease Maternal Aunt   . Diabetes Mother   . Diabetes Maternal Aunt   . Diabetes Cousin   . Colon cancer Neg Hx   . Colon polyps Mother, age 33    Social History  . Marital Status: Married    Number of Children: 2   Occupational History  . TEACHER [employer]   Social History Main Topics  . Smoking status: Never Smoker   . Smokeless tobacco: Never Used  . Alcohol Use: No  . Drug Use: No   Social History Narrative   4 caffeine drinks daily       Objective:   Physical Exam BP 128/80  Pulse 68  Ht 5\' 1"  (1.549 m)  Wt 176 lb (79.833 kg)  BMI 33.25 kg/m2  LMP 08/19/2011 Constitutional: Well-developed and well-nourished. No distress. HEENT: Normocephalic and atraumatic. Oropharynx is clear and moist. No oropharyngeal exudate. Conjunctivae are normal. Pupils are equal round and reactive to light. No scleral icterus. Neck: Neck supple. Trachea midline. Cardiovascular: Normal rate, regular rhythm and intact distal pulses. No M/R/G Pulmonary/chest: Effort normal and breath sounds normal. No wheezing, rales or rhonchi. Abdominal: Soft, nontender, nondistended. Bowel sounds active throughout. There are no masses palpable. No hepatosplenomegaly. Rectal: Small to medium-sized external hemorrhoid, nonthrombosed. No obvious anal fissure though rectal exam revealed tenderness without definite internal hemorrhoids and no rectal mass.  There was scant red blood without  significant stool Lymphadenopathy: No cervical adenopathy noted. Neurological: Alert and oriented to person place and time. Skin: Skin is warm and dry. No rashes noted. Psychiatric: Normal mood, somewhat flat affect. Behavior is normal.     Assessment & Plan:  30 yo female with a PMH of hypothyroidism, anxiety/depression, hypertension, fibromyalgia, GERD and hemorrhoids who is seen in consultation at the request of Dr. Katrinka [name] for evaluation of rectal bleeding and lower abdominal pain.  1. Rectal bleeding/hemorrhoids -- the patient has a known history of hemorrhoids, and certainly I feel that these are contributing to her rectal bleeding and pain at present. They do not explain her lower abdominal pain which continues to bother her on most days.  Given the abdominal pain component, we will proceed with colonoscopy to further characterize her pain and bleeding. I would like for her to start Analpram twice to 3  times daily for her hemorrhoids. Also would like for her to take docusate 100 mg once to twice daily to keep her stool soft. I would like for her to avoid prolonged periods of time on the toilet and straining. We discussed a colonoscopy today and she is agreeable to proceed.   We discussed medical therapy for hemorrhoids, should this be her only pathology. If she fails to respond to conservative measures then we also discussed that surgery may be eventually recommended.  Followup will be determined after colonoscopy.

## 2011-09-08 ENCOUNTER — Encounter: Payer: BC Managed Care – PPO | Admitting: Internal Medicine

## 2012-02-02 DIAGNOSIS — M549 Dorsalgia, unspecified: Secondary | ICD-10-CM | POA: Insufficient documentation

## 2012-02-02 DIAGNOSIS — G8220 Paraplegia, unspecified: Secondary | ICD-10-CM | POA: Insufficient documentation

## 2012-02-03 ENCOUNTER — Other Ambulatory Visit: Payer: Self-pay | Admitting: Neurology

## 2012-02-03 DIAGNOSIS — R292 Abnormal reflex: Secondary | ICD-10-CM

## 2012-02-03 DIAGNOSIS — R531 Weakness: Secondary | ICD-10-CM

## 2012-02-08 ENCOUNTER — Ambulatory Visit
Admission: RE | Admit: 2012-02-08 | Discharge: 2012-02-08 | Disposition: A | Discharge status: Home / Self Care | Payer: BC Managed Care – PPO | Source: Ambulatory Visit | Attending: Neurology | Admitting: Neurology

## 2012-02-08 DIAGNOSIS — R292 Abnormal reflex: Secondary | ICD-10-CM

## 2012-02-08 DIAGNOSIS — R531 Weakness: Secondary | ICD-10-CM

## 2012-03-07 ENCOUNTER — Telehealth: Payer: Self-pay | Admitting: Obstetrics and Gynecology

## 2012-03-13 NOTE — Telephone Encounter (Signed)
LM to call - needs appt.  ld

## 2012-03-23 ENCOUNTER — Telehealth: Payer: Self-pay | Admitting: Obstetrics and Gynecology

## 2012-03-23 NOTE — Telephone Encounter (Signed)
THIS IS A SR PT

## 2012-03-27 NOTE — Telephone Encounter (Signed)
Wants appt, thinks she has an infection and Testosterone level is high according to Dermatologist.

## 2012-03-28 ENCOUNTER — Encounter: Payer: Self-pay | Admitting: Obstetrics and Gynecology

## 2012-03-28 ENCOUNTER — Ambulatory Visit (INDEPENDENT_AMBULATORY_CARE_PROVIDER_SITE_OTHER): Payer: BC Managed Care – PPO | Admitting: Obstetrics and Gynecology

## 2012-03-28 VITALS — BP 102/80 | Wt 185.0 lb

## 2012-03-28 DIAGNOSIS — B373 Candidiasis of vulva and vagina: Secondary | ICD-10-CM

## 2012-03-28 DIAGNOSIS — B3731 Acute candidiasis of vulva and vagina: Secondary | ICD-10-CM

## 2012-03-28 LAB — POCT WET PREP (WET MOUNT)

## 2012-03-28 MED ORDER — FLUCONAZOLE 150 MG PO TABS: 150.0000 mg | ORAL_TABLET | ORAL | Status: AC

## 2012-03-28 MED ORDER — TERCONAZOLE 0.4 % VA CREA: 1.0000 | TOPICAL_CREAM | Freq: Every day | VAGINAL | Status: DC

## 2012-03-28 NOTE — Progress Notes (Signed)
Vaginal discharge: yellowthick mucoid Itching / Burning: yes Fever: no  Symptoms have been present for several days. Has used over-the-counter treatment: no Associated symptoms:  Pelvic pain: yes       Dyspareunia: yes     Odor:  yes  History of STD:  no history of PID, STD's STD screen:declined  Vaginal Discharge/Discomfort/Itching  Subjective:   Lauren [name] is an 31 y.o. woman who presents c/o vaginal discharge with itching and odor. Has been usingDicloxacillin for last 6 weeks for [facility] Spotted fever. Cycle is 30 days with heavier flow. Reports increased testosterone by dermatologist  Objective: discharge thick white Vaginal discharge: color white Vaginal lesions:  none  Wet prep: hyphae OSOM BV: negative OSOM Trichomonas:  not done  Assessment: Yeast vaginitis  Plan: Additional Tests:none indicated  Medications: antifungal  Follow-up: prn  RIVARD,SANDRA A MD 03/28/2012 12:25 PM

## 2012-03-30 ENCOUNTER — Other Ambulatory Visit: Payer: Self-pay

## 2012-03-30 DIAGNOSIS — N390 Urinary tract infection, site not specified: Secondary | ICD-10-CM

## 2012-04-02 ENCOUNTER — Other Ambulatory Visit: Payer: Self-pay

## 2012-04-02 DIAGNOSIS — B373 Candidiasis of vulva and vagina: Secondary | ICD-10-CM

## 2012-04-02 DIAGNOSIS — B3731 Acute candidiasis of vulva and vagina: Secondary | ICD-10-CM

## 2012-04-02 MED ORDER — TERCONAZOLE 0.4 % VA CREA: 1.0000 | TOPICAL_CREAM | Freq: Every day | VAGINAL | Status: AC

## 2012-04-30 ENCOUNTER — Encounter: Payer: Self-pay | Admitting: Internal Medicine

## 2012-04-30 ENCOUNTER — Ambulatory Visit (INDEPENDENT_AMBULATORY_CARE_PROVIDER_SITE_OTHER): Payer: BC Managed Care – PPO | Admitting: Internal Medicine

## 2012-04-30 ENCOUNTER — Other Ambulatory Visit (INDEPENDENT_AMBULATORY_CARE_PROVIDER_SITE_OTHER): Payer: BC Managed Care – PPO

## 2012-04-30 VITALS — BP 120/72 | HR 81 | Temp 98.6°F | Resp 16 | Ht 61.0 in | Wt 186.2 lb

## 2012-04-30 DIAGNOSIS — IMO0001 Reserved for inherently not codable concepts without codable children: Secondary | ICD-10-CM

## 2012-04-30 DIAGNOSIS — E039 Hypothyroidism, unspecified: Secondary | ICD-10-CM

## 2012-04-30 DIAGNOSIS — R7309 Other abnormal glucose: Secondary | ICD-10-CM

## 2012-04-30 DIAGNOSIS — G723 Periodic paralysis: Secondary | ICD-10-CM

## 2012-04-30 LAB — CBC WITH DIFFERENTIAL/PLATELET
Basophils Relative: 0.3 % (ref 0.0–3.0)
Eosinophils Absolute: 0.1 10*3/uL (ref 0.0–0.7)
Eosinophils Relative: 1.1 % (ref 0.0–5.0)
Lymphocytes Relative: 35.4 % (ref 12.0–46.0)
Neutrophils Relative %: 56.4 % (ref 43.0–77.0)
RBC: 4.53 Mil/uL (ref 3.87–5.11)
WBC: 7.6 10*3/uL (ref 4.5–10.5)

## 2012-04-30 LAB — COMPREHENSIVE METABOLIC PANEL
AST: 47 U/L — ABNORMAL HIGH (ref 0–37)
Albumin: 4 g/dL (ref 3.5–5.2)
BUN: 11 mg/dL (ref 6–23)
Calcium: 9.2 mg/dL (ref 8.4–10.5)
Chloride: 104 mEq/L (ref 96–112)
Glucose, Bld: 105 mg/dL — ABNORMAL HIGH (ref 70–99)
Potassium: 3.7 mEq/L (ref 3.5–5.1)
Total Protein: 7.4 g/dL (ref 6.0–8.3)

## 2012-04-30 LAB — LIPID PANEL
Cholesterol: 188 mg/dL (ref 0–200)
Total CHOL/HDL Ratio: 5

## 2012-04-30 LAB — C-REACTIVE PROTEIN: CRP: 2 mg/dL (ref 1–20)

## 2012-04-30 LAB — HEMOGLOBIN A1C: Hgb A1c MFr Bld: 5.9 % (ref 4.6–6.5)

## 2012-04-30 NOTE — Assessment & Plan Note (Signed)
I have asked her to have records sent to me from from [facility], I will check labs today to look for tick born disease as a cause of her symptoms, I told her that I do not think it is RMSF as that is an abrupt life-threatening illness that if not treated would have already caused a dire outcome and I am not aware of any chronic neuro complications stemming from RMSF infection, I think Lyme disease would be a more likely explanation so I have ordered labs to look for that. I will check other labs to look for inflammation and infection, anemia, renal failure, abnormal lytes, etc.

## 2012-04-30 NOTE — Assessment & Plan Note (Signed)
I will check an A1C today to see if she has DM II

## 2012-04-30 NOTE — Assessment & Plan Note (Signed)
I will check labs to look for organic, inflammatory illness, I encouraged her to work on her emotional health (she has been seeing a Warden/ranger)

## 2012-04-30 NOTE — Patient Instructions (Signed)

## 2012-04-30 NOTE — Progress Notes (Signed)
Subjective:    Patient ID: Lauren [name], female    DOB: [birth year], 31 y.o.   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 She was told a few months ago that her labs were + for RMSF and she has taken a course of doxy that lasted about 1-2 months, she tells me that the doxy gave her a severe vaginal yeast infection and GI distress and it did not help her neuro symptoms. She wants to know if I agree with the diagnosis of RMSF. Also, she is being seen by neuro at [facility] ( Dr. Harlow Asa ) and she tells me that she has had an extensive neuromuscular work-up for s/s suspicious for MS but that no objective testing has been abnormal. She reports that the neuro symptoms started about 6 years ago after the birth of her first child. She has had 2 childbirths and each was complicated by HTN and post-labor complications that required a week long stay in the hospital. It is also noteworthy that she is the child of a bipolar mother who she describes as "manipulative" and she feels like that has taken a toll on her.  Review of Systems  Constitutional: Positive for fatigue. Negative for fever, chills, diaphoresis, activity change, appetite change and unexpected weight change.  HENT: Negative.   Eyes: Negative.   Respiratory: Negative for apnea, cough, choking, chest tightness, shortness of breath, wheezing and stridor.   Cardiovascular: Negative for chest pain, palpitations and leg swelling.  Gastrointestinal: Negative for nausea, vomiting, abdominal pain, diarrhea, constipation, blood in stool, abdominal distention and anal bleeding.  Genitourinary: Negative.   Musculoskeletal: Positive for myalgias. Negative for back pain, joint swelling, arthralgias and gait problem.    Neurological: Negative for dizziness, tremors, seizures, syncope, facial asymmetry, speech difficulty, weakness, light-headedness, numbness and headaches.  Hematological: Negative for adenopathy. Does not bruise/bleed easily.  Psychiatric/Behavioral: Positive for sleep disturbance (FA's). Negative for suicidal ideas, hallucinations, behavioral problems, confusion, self-injury, dysphoric mood, decreased concentration and agitation. The patient is not nervous/anxious and is not hyperactive.        Objective:   Physical Exam  Vitals reviewed. Constitutional: She is oriented to person, place, and time. She appears well-developed and well-nourished. No distress.  HENT:  Head: Normocephalic and atraumatic.  Mouth/Throat: Oropharynx is clear and moist. No oropharyngeal exudate.  Eyes: Conjunctivae are normal. Right eye exhibits no discharge. Left eye exhibits no discharge. No scleral icterus.  Neck: Normal range of motion. Neck supple. No JVD present. No tracheal deviation present. No thyromegaly present.  Cardiovascular: Normal rate, regular rhythm, normal heart sounds and intact distal pulses.  Exam reveals no gallop and no friction rub.   No murmur heard. Pulmonary/Chest: Effort normal and breath sounds normal. No stridor. No respiratory distress. She has no wheezes. She has no rales. She exhibits no tenderness.  Abdominal: Soft. Bowel sounds are normal. She exhibits no distension and no mass. There is no tenderness. There is no rebound and no guarding.  Musculoskeletal: Normal range of motion. She exhibits no edema and no tenderness.  Lymphadenopathy:    She has no cervical adenopathy.  Neurological: She is alert and oriented to person, place, and time. She has normal reflexes. She displays normal reflexes. No cranial  nerve deficit. She exhibits normal muscle tone. Coordination normal.  Skin: Skin is warm and dry. No rash noted. She is not diaphoretic. No erythema. No pallor.  Psychiatric: She  has a normal mood and affect. Her behavior is normal. Judgment and thought content normal.          Assessment & Plan:

## 2012-04-30 NOTE — Assessment & Plan Note (Signed)
TSH today to see if she is on the correct dose

## 2012-05-01 ENCOUNTER — Encounter: Payer: Self-pay | Admitting: Internal Medicine

## 2012-05-01 ENCOUNTER — Telehealth: Payer: Self-pay | Admitting: *Deleted

## 2012-05-01 LAB — B. BURGDORFI ANTIBODIES BY WB: B burgdorferi IgM Abs (IB): NEGATIVE

## 2012-05-01 LAB — ROCKY MTN SPOTTED FVR AB, IGG-BLOOD: RMSF IgG: 0.12 IV

## 2012-05-01 NOTE — Telephone Encounter (Signed)
Received call from Thelma Barge at [facility] on patient of Dr, Leitha Schuller of critical value for increased [facility] Spotted Fever; IGM 1.58/  Reported and verified x2.

## 2013-05-09 ENCOUNTER — Telehealth: Payer: Self-pay | Admitting: *Deleted

## 2013-05-09 MED ORDER — METRONIDAZOLE 0.75 % VA GEL: 1.0000 | VAGINAL | Status: DC

## 2013-05-09 NOTE — Telephone Encounter (Signed)
Pt informed Metrogel e-scribed.

## 2013-05-09 NOTE — Telephone Encounter (Signed)
Pt states thinks she has BV, been treated by Dr. Despina Hidden in the past for BV with Metronidazole.  Requesting Metronidazole for the BV.

## 2013-05-13 ENCOUNTER — Ambulatory Visit: Payer: Self-pay | Admitting: Obstetrics & Gynecology

## 2013-08-01 ENCOUNTER — Encounter: Payer: Self-pay | Admitting: Advanced Practice Midwife

## 2013-08-01 ENCOUNTER — Ambulatory Visit (INDEPENDENT_AMBULATORY_CARE_PROVIDER_SITE_OTHER): Payer: BC Managed Care – PPO | Admitting: Advanced Practice Midwife

## 2013-08-01 VITALS — BP 110/84 | Ht 61.0 in | Wt 177.0 lb

## 2013-08-01 DIAGNOSIS — R300 Dysuria: Secondary | ICD-10-CM

## 2013-08-01 LAB — POCT URINALYSIS DIPSTICK
Leukocytes, UA: NEGATIVE
Nitrite, UA: NEGATIVE
Protein, UA: NEGATIVE

## 2013-08-01 MED ORDER — METRONIDAZOLE 0.75 % VA GEL: 1.0000 | VAGINAL | Status: DC

## 2013-08-01 MED ORDER — FLUCONAZOLE 150 MG PO TABS: 150.0000 mg | ORAL_TABLET | Freq: Once | ORAL | Status: DC

## 2013-08-01 NOTE — Patient Instructions (Addendum)
Make sure urine cultures are +.  If not, consider Interstitial Cystitis

## 2013-08-01 NOTE — Progress Notes (Signed)
Lauren [name] 32 y.o.  Filed Vitals:   08/01/13 1415  BP: 110/84     Past Medical History: Past Medical History  Diagnosis Date  . Depression   . Hypothyroidism   . HSV-1 infection   . Fibromyalgia   . Preeclampsia   . Hypertension   . Anxiety   . Hemorrhoids   . Constipation   . GERD (gastroesophageal reflux disease)     Past Surgical History: Past Surgical History  Procedure Laterality Date  . Cholecystectomy  2006  . Tonsillectomy    . Wisdom tooth extraction      Family History: Family History  Problem Relation Age of Onset  . Heart disease Maternal Grandmother   . Diabetes Maternal Grandmother   . Hypothyroidism Mother   . Cervical cancer Mother   . Bipolar disorder Mother   . Bipolar disorder Brother   . Kidney disease Brother   . Alcohol abuse Brother   . Lung cancer Maternal Grandfather   . Heart disease Maternal Aunt   . Diabetes Mother   . Diabetes Maternal Aunt   . Diabetes Cousin   . Colon cancer Neg Hx   . Colon polyps Mother     Social History: History  Substance Use Topics  . Smoking status: Never Smoker   . Smokeless tobacco: Never Used  . Alcohol Use: Yes     Comment: occ    Allergies: No Known Allergies   History of Present Illness:  Treated self for BV (green discharge, etc.)  Sx cleared, now feels like it's coming back.  Seeing [facility] for frequent UTI's (goes back MOnday)  Current Medications, Allergies, Past Medical History, Past Surgical History, Family History and Social History were reviewed in [name] record.     Review of Systems   Patient denies any headaches, blurred vision, shortness of breath, chest pain, abdominal pain, problems with bowel movements, urination, or intercourse.   PE:  SSE:  Come clumpy discharge, ? Metrogel.  Few yeast, no clue or WBC's  Impression: Possible yeast.  rx diflucan    Plan:

## 2013-09-19 ENCOUNTER — Encounter: Payer: Self-pay | Admitting: Internal Medicine

## 2013-09-19 DIAGNOSIS — F329 Major depressive disorder, single episode, unspecified: Secondary | ICD-10-CM

## 2013-09-19 DIAGNOSIS — F32A Depression, unspecified: Secondary | ICD-10-CM | POA: Insufficient documentation

## 2013-09-19 DIAGNOSIS — I10 Essential (primary) hypertension: Secondary | ICD-10-CM | POA: Insufficient documentation

## 2013-10-01 ENCOUNTER — Other Ambulatory Visit: Payer: Self-pay | Admitting: Internal Medicine

## 2013-10-01 MED ORDER — METFORMIN HCL ER 500 MG PO TB24: 500.0000 mg | ORAL_TABLET | Freq: Every day | ORAL | Status: DC

## 2013-10-16 ENCOUNTER — Ambulatory Visit: Payer: Self-pay | Admitting: Physician Assistant

## 2013-10-16 ENCOUNTER — Other Ambulatory Visit: Payer: Self-pay | Admitting: Physician Assistant

## 2013-10-16 DIAGNOSIS — J329 Chronic sinusitis, unspecified: Secondary | ICD-10-CM

## 2013-10-16 MED ORDER — AMOXICILLIN-POT CLAVULANATE 875-125 MG PO TABS: 1.0000 | ORAL_TABLET | Freq: Two times a day (BID) | ORAL | Status: AC

## 2013-10-16 MED ORDER — PREDNISONE 20 MG PO TABS: ORAL_TABLET | ORAL | Status: DC

## 2013-10-23 ENCOUNTER — Other Ambulatory Visit: Payer: Self-pay | Admitting: Internal Medicine

## 2013-10-23 MED ORDER — LEVOTHYROXINE SODIUM 100 MCG PO TABS: 100.0000 ug | ORAL_TABLET | Freq: Every day | ORAL | Status: DC

## 2013-10-31 ENCOUNTER — Encounter: Payer: Self-pay | Admitting: Emergency Medicine

## 2013-10-31 ENCOUNTER — Ambulatory Visit (INDEPENDENT_AMBULATORY_CARE_PROVIDER_SITE_OTHER): Payer: BC Managed Care – PPO | Admitting: Emergency Medicine

## 2013-10-31 VITALS — BP 104/62 | HR 74 | Temp 98.4°F | Resp 18 | Wt 173.0 lb

## 2013-10-31 DIAGNOSIS — R109 Unspecified abdominal pain: Secondary | ICD-10-CM

## 2013-10-31 DIAGNOSIS — IMO0001 Reserved for inherently not codable concepts without codable children: Secondary | ICD-10-CM

## 2013-10-31 DIAGNOSIS — E039 Hypothyroidism, unspecified: Secondary | ICD-10-CM

## 2013-10-31 DIAGNOSIS — R5381 Other malaise: Secondary | ICD-10-CM

## 2013-10-31 DIAGNOSIS — R350 Frequency of micturition: Secondary | ICD-10-CM

## 2013-10-31 LAB — BASIC METABOLIC PANEL WITH GFR
CO2: 28 mEq/L (ref 19–32)
Calcium: 9.6 mg/dL (ref 8.4–10.5)
Chloride: 101 mEq/L (ref 96–112)
Creat: 0.61 mg/dL (ref 0.50–1.10)
GFR, Est African American: >89 mL/min
GFR, Est Non African American: >89 mL/min
Sodium: 137 mEq/L (ref 135–145)

## 2013-10-31 LAB — CBC WITH DIFFERENTIAL/PLATELET
Basophils Absolute: 0 10*3/uL (ref 0.0–0.1)
Basophils Relative: 0 % (ref 0–1)
Eosinophils Absolute: 0.1 10*3/uL (ref 0.0–0.7)
Eosinophils Relative: 1 % (ref 0–5)
HCT: 40.6 % (ref 36.0–46.0)
Hemoglobin: 13.8 g/dL (ref 12.0–15.0)
Lymphocytes Relative: 21 % (ref 12–46)
MCH: 29.3 pg (ref 26.0–34.0)
MCHC: 34 g/dL (ref 30.0–36.0)
Monocytes Absolute: 0.8 10*3/uL (ref 0.1–1.0)
Monocytes Relative: 8 % (ref 3–12)
Neutro Abs: 6.9 10*3/uL (ref 1.7–7.7)
Neutrophils Relative %: 70 % (ref 43–77)
Platelets: 209 10*3/uL (ref 150–400)
RDW: 13.5 % (ref 11.5–15.5)
WBC: 9.7 10*3/uL (ref 4.0–10.5)

## 2013-10-31 MED ORDER — LEVOFLOXACIN 500 MG PO TABS: 500.0000 mg | ORAL_TABLET | Freq: Every day | ORAL | Status: AC

## 2013-10-31 MED ORDER — FLUCONAZOLE 150 MG PO TABS: 150.0000 mg | ORAL_TABLET | Freq: Every day | ORAL | Status: DC

## 2013-10-31 NOTE — Patient Instructions (Signed)
Urinary Tract Infection °A urinary tract infection (UTI) can occur any place along the urinary tract. The tract includes the kidneys, ureters, bladder, and urethra. A type of germ called bacteria often causes a UTI. UTIs are often helped with antibiotic medicine.  °HOME CARE  °· If given, take antibiotics as told by your doctor. Finish them even if you start to feel better. °· Drink enough fluids to keep your pee (urine) clear or pale yellow. °· Avoid tea, drinks with caffeine, and bubbly (carbonated) drinks. °· Pee often. Avoid holding your pee in for a long time. °· Pee before and after having sex (intercourse). °· Wipe from front to back after you poop (bowel movement) if you are a woman. Use each tissue only once. °GET HELP RIGHT AWAY IF:  °· You have back pain. °· You have lower belly (abdominal) pain. °· You have chills. °· You feel sick to your stomach (nauseous). °· You throw up (vomit). °· Your burning or discomfort with peeing does not go away. °· You have a fever. °· Your symptoms are not better in 3 days. °MAKE SURE YOU:  °· Understand these instructions. °· Will watch your condition. °· Will get help right away if you are not doing well or get worse. °Document Released: 04/25/2008 Document Revised: 08/01/2012 Document Reviewed: 06/07/2012 °ExitCare® Patient Information ©2014 [employer]. ° °

## 2013-11-01 LAB — URINALYSIS, ROUTINE W REFLEX MICROSCOPIC
Hgb urine dipstick: NEGATIVE
Ketones, ur: NEGATIVE mg/dL
Leukocytes, UA: NEGATIVE
Nitrite: NEGATIVE
Protein, ur: NEGATIVE mg/dL
Specific Gravity, Urine: 1.02 (ref 1.005–1.030)
Urobilinogen, UA: 0.2 mg/dL (ref 0.0–1.0)
pH: 7 (ref 5.0–8.0)

## 2013-11-01 LAB — TSH: TSH: 1.146 u[IU]/mL (ref 0.350–4.500)

## 2013-11-01 NOTE — Progress Notes (Signed)
Subjective:    Patient ID: Lauren [name], female    DOB: Nov 22, 1980, 32 y.o.   MRN: [id number]  HPI Comments: 32 yo with chronic fatigue with multiple negative workups. She did not f/u for thyroid recheck with dose change AD. She is not exercising AD.   She has noted abdomen pain that started in RLQ and now is across low abdomen. She also notes increased urine frequency. She denies any other symptoms.    Current Outpatient Prescriptions on File Prior to Visit  Medication Sig Dispense Refill  . ALPRAZolam (XANAX XR) 1 MG 24 hr tablet Take 1 mg by mouth. QD- TID PRN      . Cholecalciferol (VITAMIN D) 2000 UNITS CAPS Take 1 capsule by mouth daily.        Marland Kitchen ibuprofen (ADVIL,MOTRIN) 200 MG tablet Take 200 mg by mouth every 6 (six) hours as needed for pain. Takes 3 BID      . metFORMIN (GLUCOPHAGE-XR) 500 MG 24 hr tablet Take 1 tablet (500 mg total) by mouth daily.  30 tablet  3  . metroNIDAZOLE (METROGEL VAGINAL) 0.75 % vaginal gel Place 1 Applicatorful vaginally 1 day or 1 dose.  70 g  2  . pravastatin (PRAVACHOL) 40 MG tablet       . ALPRAZolam (XANAX) 0.25 MG tablet Take by mouth at bedtime as needed. Take as needed      . Ascorbic Acid (VITAMIN C) 1000 MG tablet Take 1,000 mg by mouth daily.        Marland Kitchen CRANBERRY PO Take by mouth 2 (two) times daily. Takes 2 BID      . DULoxetine (CYMBALTA) 30 MG capsule Takes 90 mg once daily by mouth       . Flaxseed, Linseed, 1000 MG CAPS Take 1 capsule by mouth daily.        . fluconazole (DIFLUCAN) 150 MG tablet Take 1 tablet (150 mg total) by mouth once.  1 tablet  3  . Magnesium 500 MG TABS Take by mouth.        . Multiple Vitamin (MULTIVITAMIN PO) Take 1 capsule by mouth daily.        . Multiple Vitamins-Minerals (MULTIVITAMIN WITH MINERALS) tablet Take 1 tablet by mouth daily.        . Omega-3 Fatty Acids (FISH OIL) 1000 MG CAPS Take 1 capsule by mouth 2 (two) times daily.        . predniSONE (DELTASONE) 20 MG tablet Take one pill two times daily  for 3 days, take one pill daily for 4 days.  10 tablet  0  . Probiotic Product (PROBIOTIC ACIDOPHILUS) CAPS Take by mouth daily.      . propranolol (INDERAL) 10 MG tablet Takes 10 mg in the morning and 20 mg by mouth once daily at bedtime      . Red Yeast Rice Extract (RED YEAST RICE PO) Take 1 capsule by mouth 2 (two) times daily.         No current facility-administered medications on file prior to visit.   ALLERGIES Doxycycline; Effexor; Prednisone; and Savella  Past Medical History  Diagnosis Date  . Depression   . Hypothyroidism   . HSV-1 infection   . Fibromyalgia   . Preeclampsia   . Hypertension   . Anxiety   . Hemorrhoids   . Constipation   . GERD (gastroesophageal reflux disease)   . Other abnormal glucose   . Anemia     Review of Systems  Constitutional: Positive for fatigue.  Gastrointestinal: Positive for abdominal pain.  Genitourinary: Positive for frequency.  All other systems reviewed and are negative.    BP 104/62  Pulse 74  Temp(Src) 98.4 F (36.9 C) (Temporal)  Resp 18  Wt 173 lb (78.472 kg)  LMP 10/24/2013     Objective:   Physical Exam  Nursing note and vitals reviewed. Constitutional: She is oriented to person, place, and time. She appears well-developed and well-nourished. No distress.  HENT:  Head: Normocephalic and atraumatic.  Right Ear: External ear normal.  Left Ear: External ear normal.  Nose: Nose normal.  Mouth/Throat: Oropharynx is clear and moist.  Eyes: Conjunctivae and EOM are normal.  Neck: Normal range of motion. Neck supple. No JVD present. No thyromegaly present.  Cardiovascular: Normal rate, regular rhythm, normal heart sounds and intact distal pulses.   Pulmonary/Chest: Effort normal and breath sounds normal.  Abdominal: Soft. Bowel sounds are normal. She exhibits no distension and no mass. There is tenderness. There is no rebound and no guarding.  Diffuse, increased suprapubic  Musculoskeletal: Normal range of  motion. She exhibits no edema and no tenderness.  Lymphadenopathy:    She has no cervical adenopathy.  Neurological: She is alert and oriented to person, place, and time. No cranial nerve deficit.  Skin: Skin is warm and dry. No rash noted. No erythema. No pallor.  Psychiatric: She has a normal mood and affect. Her behavior is normal. Judgment and thought content normal.          Assessment & Plan:  1. CHRONIC FATIGUE VS DEPRESSION VS HYPOTHYROID- CHECK LABS 2. Abdomen pain with urine frequency- Check labs- levaquin 500 mg AD- if symptoms increase ER. If no relief needs evaluation for interstitial cystitis at urology

## 2013-11-02 LAB — URINE CULTURE: Colony Count: 3000

## 2013-11-11 ENCOUNTER — Other Ambulatory Visit: Payer: Self-pay | Admitting: Emergency Medicine

## 2013-11-11 MED ORDER — ALPRAZOLAM ER 1 MG PO TB24: 1.0000 mg | ORAL_TABLET | Freq: Two times a day (BID) | ORAL | Status: DC

## 2013-11-19 ENCOUNTER — Other Ambulatory Visit: Payer: Self-pay | Admitting: Internal Medicine

## 2013-11-19 MED ORDER — LEVOTHYROXINE SODIUM 100 MCG PO TABS: 100.0000 ug | ORAL_TABLET | Freq: Every day | ORAL | Status: DC

## 2013-12-18 ENCOUNTER — Encounter: Payer: Self-pay | Admitting: Emergency Medicine

## 2013-12-18 ENCOUNTER — Ambulatory Visit (INDEPENDENT_AMBULATORY_CARE_PROVIDER_SITE_OTHER): Payer: BC Managed Care – PPO | Admitting: Emergency Medicine

## 2013-12-18 VITALS — BP 110/70 | HR 78 | Temp 98.2°F | Resp 18 | Ht 61.0 in | Wt 165.0 lb

## 2013-12-18 DIAGNOSIS — E039 Hypothyroidism, unspecified: Secondary | ICD-10-CM

## 2013-12-18 DIAGNOSIS — E782 Mixed hyperlipidemia: Secondary | ICD-10-CM

## 2013-12-18 DIAGNOSIS — R5383 Other fatigue: Secondary | ICD-10-CM

## 2013-12-18 DIAGNOSIS — N3000 Acute cystitis without hematuria: Secondary | ICD-10-CM

## 2013-12-18 DIAGNOSIS — N898 Other specified noninflammatory disorders of vagina: Secondary | ICD-10-CM

## 2013-12-18 DIAGNOSIS — I10 Essential (primary) hypertension: Secondary | ICD-10-CM

## 2013-12-18 DIAGNOSIS — R5381 Other malaise: Secondary | ICD-10-CM

## 2013-12-18 DIAGNOSIS — R350 Frequency of micturition: Secondary | ICD-10-CM

## 2013-12-18 DIAGNOSIS — R7309 Other abnormal glucose: Secondary | ICD-10-CM

## 2013-12-18 LAB — CBC WITH DIFFERENTIAL/PLATELET
Basophils Absolute: 0 10*3/uL (ref 0.0–0.1)
Basophils Relative: 0 % (ref 0–1)
Eosinophils Absolute: 0.1 10*3/uL (ref 0.0–0.7)
Eosinophils Relative: 1 % (ref 0–5)
HEMATOCRIT: 39.7 % (ref 36.0–46.0)
Hemoglobin: 13.7 g/dL (ref 12.0–15.0)
LYMPHS PCT: 38 % (ref 12–46)
Lymphs Abs: 2.8 10*3/uL (ref 0.7–4.0)
MCH: 29.2 pg (ref 26.0–34.0)
MCHC: 34.5 g/dL (ref 30.0–36.0)
MCV: 84.6 fL (ref 78.0–100.0)
MONOS PCT: 6 % (ref 3–12)
Monocytes Absolute: 0.5 10*3/uL (ref 0.1–1.0)
NEUTROS ABS: 4 10*3/uL (ref 1.7–7.7)
Neutrophils Relative %: 55 % (ref 43–77)
Platelets: 213 10*3/uL (ref 150–400)
RBC: 4.69 MIL/uL (ref 3.87–5.11)
RDW: 12.9 % (ref 11.5–15.5)
WBC: 7.4 10*3/uL (ref 4.0–10.5)

## 2013-12-18 NOTE — Patient Instructions (Signed)
Monilial Vaginitis  Vaginitis in a soreness, swelling and redness (inflammation) of the vagina and vulva. Monilial vaginitis is not a sexually transmitted infection.  CAUSES   Yeast vaginitis is caused by yeast (candida) that is normally found in your vagina. With a yeast infection, the candida has overgrown in number to a point that upsets the chemical balance.  SYMPTOMS   · White, thick vaginal discharge.  · Swelling, itching, redness and irritation of the vagina and possibly the lips of the vagina (vulva).  · Burning or painful urination.  · Painful intercourse.  DIAGNOSIS   Things that may contribute to monilial vaginitis are:  · Postmenopausal and virginal states.  · Pregnancy.  · Infections.  · Being tired, sick or stressed, especially if you had monilial vaginitis in the past.  · Diabetes. Good control will help lower the chance.  · Birth control pills.  · Tight fitting garments.  · Using bubble bath, feminine sprays, douches or deodorant tampons.  · Taking certain medications that kill germs (antibiotics).  · Sporadic recurrence can occur if you become ill.  TREATMENT   Your caregiver will give you medication.  · There are several kinds of anti monilial vaginal creams and suppositories specific for monilial vaginitis. For recurrent yeast infections, use a suppository or cream in the vagina 2 times a week, or as directed.  · Anti-monilial or steroid cream for the itching or irritation of the vulva may also be used. Get your caregiver's permission.  · Painting the vagina with methylene blue solution may help if the monilial cream does not work.  · Eating yogurt may help prevent monilial vaginitis.  HOME CARE INSTRUCTIONS   · Finish all medication as prescribed.  · Do not have sex until treatment is completed or after your caregiver tells you it is okay.  · Take warm sitz baths.  · Do not douche.  · Do not use tampons, especially scented ones.  · Wear cotton underwear.  · Avoid tight pants and panty  hose.  · Tell your sexual partner that you have a yeast infection. They should go to their caregiver if they have symptoms such as mild rash or itching.  · Your sexual partner should be treated as well if your infection is difficult to eliminate.  · Practice safer sex. Use condoms.  · Some vaginal medications cause latex condoms to fail. Vaginal medications that harm condoms are:  · Cleocin cream.  · Butoconazole (Femstat®).  · Terconazole (Terazol®) vaginal suppository.  · Miconazole (Monistat®) (may be purchased over the counter).  SEEK MEDICAL CARE IF:   · You have a temperature by mouth above 102° F (38.9° C).  · The infection is getting worse after 2 days of treatment.  · The infection is not getting better after 3 days of treatment.  · You develop blisters in or around your vagina.  · You develop vaginal bleeding, and it is not your menstrual period.  · You have pain when you urinate.  · You develop intestinal problems.  · You have pain with sexual intercourse.  Document Released: 08/17/2005 Document Revised: 01/30/2012 Document Reviewed: 05/01/2009  ExitCare® Patient Information ©2014 [employer].

## 2013-12-18 NOTE — Progress Notes (Signed)
Subjective:    Patient ID: Lauren [name], female    DOB: [birth year], 33 y.o.   MRN: [id number]  HPI Comments: 33 yo female with recurrent HX BV and chronic fatigue has been having increased symptoms x over 1 week. She has been having increased d/c with odor vaginally x  3 weeks. She has taken the metronidazole x 3 days with relief. She is urinating frequently. She denies any changes with diet or stress. She occasionally remembers to do the metronidazole gel after intercourse AD by GYN.   She also needs presents for 3 month F/U for Cholesterol, Pre-Dm, D. Deficient. She has not been exercising as much but is trying to improve this. She is still trying to eat better and lose weight. She has done well on metformin.    Dysuria     Current Outpatient Prescriptions on File Prior to Visit  Medication Sig Dispense Refill  . Ascorbic Acid (VITAMIN C) 1000 MG tablet Take 1,000 mg by mouth daily.        . Cholecalciferol (VITAMIN D) 2000 UNITS CAPS Take 8,000 Units by mouth daily.       Marland Kitchen FLUoxetine (PROZAC) 20 MG capsule Take 20 mg by mouth daily.      Marland Kitchen ibuprofen (ADVIL,MOTRIN) 200 MG tablet Take 200 mg by mouth every 6 (six) hours as needed for pain. Takes 3 BID      . levothyroxine (SYNTHROID, LEVOTHROID) 100 MCG tablet Take 1 tablet (100 mcg total) by mouth daily. 1 1/2 M,W F, 1 rest of week  30 tablet  3  . metFORMIN (GLUCOPHAGE-XR) 500 MG 24 hr tablet Take 1 tablet (500 mg total) by mouth daily.  30 tablet  3  . pravastatin (PRAVACHOL) 40 MG tablet       . acyclovir (ZOVIRAX) 800 MG tablet Take 800 mg by mouth daily as needed.      . fluconazole (DIFLUCAN) 150 MG tablet Take 1 tablet (150 mg total) by mouth daily.  2 tablet  0  . metroNIDAZOLE (METROGEL VAGINAL) 0.75 % vaginal gel Place 1 Applicatorful vaginally 1 day or 1 dose.  70 g  2   No current facility-administered medications on file prior to visit.   ALLERGIES Doxycycline; Effexor; Prednisone; and Savella  Past Medical History   Diagnosis Date  . Depression   . Hypothyroidism   . HSV-1 infection   . Fibromyalgia   . Preeclampsia   . Hypertension   . Anxiety   . Hemorrhoids   . Constipation   . GERD (gastroesophageal reflux disease)   . Other abnormal glucose   . Anemia      Review of Systems  Constitutional: Positive for fatigue.  Genitourinary: Positive for dysuria.  All other systems reviewed and are negative.   BP 110/70  Pulse 78  Temp(Src) 98.2 F (36.8 C) (Temporal)  Resp 18  Ht 5\' 1"  (1.549 m)  Wt 165 lb (74.844 kg)  BMI 31.19 kg/m2  LMP 11/24/2013     Objective:   Physical Exam  Nursing note and vitals reviewed. Constitutional: She is oriented to person, place, and time. She appears well-developed and well-nourished. No distress.  HENT:  Head: Normocephalic and atraumatic.  Right Ear: External ear normal.  Left Ear: External ear normal.  Nose: Nose normal.  Mouth/Throat: Oropharynx is clear and moist.  Eyes: Conjunctivae and EOM are normal.  Neck: Normal range of motion. Neck supple. No JVD present. No thyromegaly present.  Cardiovascular: Normal rate, regular rhythm, normal  heart sounds and intact distal pulses.   Pulmonary/Chest: Effort normal and breath sounds normal.  Abdominal: Soft. Bowel sounds are normal. She exhibits no distension and no mass. There is tenderness. There is no rebound and no guarding.  Suprapubic   Musculoskeletal: Normal range of motion. She exhibits no edema and no tenderness.  Lymphadenopathy:    She has no cervical adenopathy.  Neurological: She is alert and oriented to person, place, and time. No cranial nerve deficit.  Skin: Skin is warm and dry. No rash noted. No erythema. No pallor.  Psychiatric: She has a normal mood and affect. Her behavior is normal. Judgment and thought content normal.          Assessment & Plan:  1.  3 month F/U for Cholesterol, Pre-Dm, Hypothyroidism, D. Deficient. Needs healthy diet, cardio QD and obtain healthy  weight. Check Labs, Check BP if >130/80 call office 2. Urine frequency vs vaginal d/c vs chronic Fatigue- check labs, increase activity and H2O

## 2013-12-19 LAB — LIPID PANEL
Cholesterol: 127 mg/dL (ref 0–200)
HDL: 42 mg/dL (ref >39)
LDL CALC: 68 mg/dL (ref 0–99)
Total CHOL/HDL Ratio: 3 Ratio
Triglycerides: 86 mg/dL (ref <150)
VLDL: 17 mg/dL (ref 0–40)

## 2013-12-19 LAB — URINALYSIS, ROUTINE W REFLEX MICROSCOPIC
BILIRUBIN URINE: NEGATIVE
Glucose, UA: NEGATIVE mg/dL
Ketones, ur: NEGATIVE mg/dL
NITRITE: NEGATIVE
PROTEIN: NEGATIVE mg/dL
Urobilinogen, UA: 0.2 mg/dL (ref 0.0–1.0)
pH: 5.5 (ref 5.0–8.0)

## 2013-12-19 LAB — HEPATIC FUNCTION PANEL
ALBUMIN: 4.6 g/dL (ref 3.5–5.2)
ALK PHOS: 50 U/L (ref 39–117)
ALT: 32 U/L (ref 0–35)
AST: 26 U/L (ref 0–37)
Bilirubin, Direct: 0.2 mg/dL (ref 0.0–0.3)
Indirect Bilirubin: 0.5 mg/dL (ref 0.2–1.2)
TOTAL PROTEIN: 7.1 g/dL (ref 6.0–8.3)
Total Bilirubin: 0.7 mg/dL (ref 0.2–1.2)

## 2013-12-19 LAB — WET PREP BY MOLECULAR PROBE
Candida species: NEGATIVE
Gardnerella vaginalis: POSITIVE — AB
TRICHOMONAS VAG: NEGATIVE

## 2013-12-19 LAB — URINALYSIS, MICROSCOPIC ONLY
Bacteria, UA: NONE SEEN
CRYSTALS: NONE SEEN
Casts: NONE SEEN
Squamous Epithelial / LPF: NONE SEEN

## 2013-12-19 LAB — TSH: TSH: 0.102 u[IU]/mL — ABNORMAL LOW (ref 0.350–4.500)

## 2013-12-19 LAB — BASIC METABOLIC PANEL WITH GFR
BUN: 12 mg/dL (ref 6–23)
CHLORIDE: 103 meq/L (ref 96–112)
CO2: 27 meq/L (ref 19–32)
Calcium: 9.5 mg/dL (ref 8.4–10.5)
Creat: 0.6 mg/dL (ref 0.50–1.10)
GFR, Est African American: >89 mL/min
GFR, Est Non African American: >89 mL/min
GLUCOSE: 84 mg/dL (ref 70–99)
POTASSIUM: 4.1 meq/L (ref 3.5–5.3)
Sodium: 137 mEq/L (ref 135–145)

## 2013-12-19 LAB — HEMOGLOBIN A1C
Hgb A1c MFr Bld: 5.5 % (ref <5.7)
MEAN PLASMA GLUCOSE: 111 mg/dL (ref <117)

## 2013-12-19 LAB — INSULIN, FASTING: Insulin fasting, serum: 16 u[IU]/mL (ref 3–28)

## 2013-12-20 LAB — URINE CULTURE

## 2013-12-21 ENCOUNTER — Other Ambulatory Visit: Payer: Self-pay | Admitting: Emergency Medicine

## 2013-12-21 MED ORDER — CIPROFLOXACIN HCL 500 MG PO TABS: 500.0000 mg | ORAL_TABLET | Freq: Two times a day (BID) | ORAL | Status: AC

## 2014-01-03 ENCOUNTER — Telehealth: Payer: Self-pay | Admitting: *Deleted

## 2014-01-03 NOTE — Telephone Encounter (Signed)
Patient called and left message with the front staff stating Rx for BV not helping with symptoms.  Now having symptoms of burning and itching.  Per Beatriz ChancellorMelsisa Smith, PA-C patient was advised she needs a follow up at her GYN office with recurrent BV and Rx's offering no relief.  Patient states she will follow up at GYN office asap.

## 2014-01-15 ENCOUNTER — Other Ambulatory Visit: Payer: Self-pay | Admitting: *Deleted

## 2014-01-15 ENCOUNTER — Other Ambulatory Visit: Payer: BC Managed Care – PPO

## 2014-01-15 DIAGNOSIS — N390 Urinary tract infection, site not specified: Secondary | ICD-10-CM

## 2014-01-15 DIAGNOSIS — E039 Hypothyroidism, unspecified: Secondary | ICD-10-CM

## 2014-01-15 NOTE — Addendum Note (Signed)
Addended by: Braydn Carneiro A on: 01/15/2014 04:39 PM   Modules accepted: Orders

## 2014-01-15 NOTE — Addendum Note (Signed)
Addended by: Loree FeeSMITH, Jovoni Borkenhagen R on: 01/15/2014 04:22 PM   Modules accepted: Orders

## 2014-01-15 NOTE — Addendum Note (Signed)
Addended by: Quentin MullingOLLIER, Raul Torrance R on: 01/15/2014 04:59 PM   Modules accepted: Orders

## 2014-01-15 NOTE — Addendum Note (Signed)
Addended by: Emerson MonteICKENS, ANGIE S on: 01/15/2014 04:59 PM   Modules accepted: Orders

## 2014-01-16 ENCOUNTER — Other Ambulatory Visit: Payer: Self-pay

## 2014-01-16 LAB — URINALYSIS, ROUTINE W REFLEX MICROSCOPIC
Bilirubin Urine: NEGATIVE
Glucose, UA: NEGATIVE mg/dL
Ketones, ur: NEGATIVE mg/dL
NITRITE: NEGATIVE
PH: 5.5 (ref 5.0–8.0)
Protein, ur: NEGATIVE mg/dL
SPECIFIC GRAVITY, URINE: 1.01 (ref 1.005–1.030)
UROBILINOGEN UA: 0.2 mg/dL (ref 0.0–1.0)

## 2014-01-16 LAB — TSH: TSH: 0.12 u[IU]/mL — AB (ref 0.350–4.500)

## 2014-01-16 LAB — URINALYSIS, MICROSCOPIC ONLY
BACTERIA UA: NONE SEEN
Casts: NONE SEEN
Crystals: NONE SEEN
RBC / HPF: >50 RBC/hpf — AB (ref <3)
Squamous Epithelial / LPF: NONE SEEN

## 2014-01-18 LAB — URINE CULTURE

## 2014-02-03 ENCOUNTER — Encounter: Payer: Self-pay | Admitting: Physician Assistant

## 2014-02-03 ENCOUNTER — Ambulatory Visit (INDEPENDENT_AMBULATORY_CARE_PROVIDER_SITE_OTHER): Payer: BC Managed Care – PPO | Admitting: Physician Assistant

## 2014-02-03 VITALS — BP 110/64 | HR 76 | Temp 97.9°F | Resp 16 | Wt 158.0 lb

## 2014-02-03 DIAGNOSIS — M26609 Unspecified temporomandibular joint disorder, unspecified side: Secondary | ICD-10-CM

## 2014-02-03 DIAGNOSIS — M542 Cervicalgia: Secondary | ICD-10-CM

## 2014-02-03 DIAGNOSIS — M79609 Pain in unspecified limb: Secondary | ICD-10-CM

## 2014-02-03 DIAGNOSIS — M5442 Lumbago with sciatica, left side: Secondary | ICD-10-CM

## 2014-02-03 DIAGNOSIS — M5430 Sciatica, unspecified side: Secondary | ICD-10-CM

## 2014-02-03 DIAGNOSIS — M79605 Pain in left leg: Secondary | ICD-10-CM

## 2014-02-03 MED ORDER — CYCLOBENZAPRINE HCL 10 MG PO TABS: ORAL_TABLET | ORAL | Status: DC

## 2014-02-03 NOTE — Progress Notes (Signed)
   Subjective:    Patient ID: Lowella PettiesMandy E Kisamore, female    DOB: 02/05/1981, 33 y.o.   MRN: [id number]  HPI 33 y.o. female went for a walk on Thursday and started to have sinus pain, having headaches on top of her head and on left face/temple behind her eye. Some nausea, left ear pain and left shoulder/neck pain. Occasional low grade temperature. Denies drainage, cough, congestions, SOB, CP. States her mouth/nose feels dry. She has been having left leg pain right at her thigh, achy, denies numbness or tingling. She had stopped the xanax at night one week ago, wears night guard.    Review of Systems  Constitutional: Negative.   Respiratory: Negative.   Cardiovascular: Negative.   Gastrointestinal: Negative.   Genitourinary: Negative.   Musculoskeletal: Positive for myalgias (left leg pain) and neck pain. Negative for arthralgias, back pain, gait problem, joint swelling and neck stiffness.  Neurological: Positive for dizziness, numbness and headaches. Negative for tremors, seizures, syncope, facial asymmetry, speech difficulty and light-headedness.       Objective:   Physical Exam  Constitutional: She is oriented to person, place, and time. She appears well-developed and well-nourished.  HENT:  Head: Normocephalic and atraumatic.  Right Ear: External ear normal.  Left Ear: External ear normal.  Mouth/Throat: Oropharynx is clear and moist.  + TMJ bilateral, neck pain  Eyes: Conjunctivae and EOM are normal. Pupils are equal, round, and reactive to light.  Neck: Normal range of motion. Neck supple. No thyromegaly present.  Cardiovascular: Normal rate, regular rhythm and normal heart sounds.  Exam reveals no gallop and no friction rub.   No murmur heard. Pulmonary/Chest: Effort normal and breath sounds normal. No respiratory distress. She has no wheezes.  Abdominal: Soft. Bowel sounds are normal. She exhibits no distension and no mass. There is no tenderness. There is no rebound and no  guarding.  Musculoskeletal: Normal range of motion.  + left SI pain, negative straight leg  Lymphadenopathy:    She has no cervical adenopathy.  Neurological: She is alert and oriented to person, place, and time. She displays normal reflexes. No cranial nerve deficit. Coordination normal.  Skin: Skin is warm and dry.  Psychiatric: She has a normal mood and affect.      Assessment & Plan:  TMJ- information given to the patient, no gum/decrease hard foods, warm wet wash clothes, decrease stress, talk with dentist about possible night guard another one, can do massage, and exercise. Will give flexeril 10mg  1-2 at night rather than xanax and will send to PT for neck/TMJ pain.   Genella RifeGerd- try dexilant, tums, if worse will send to GI  Left leg pain and left SI pain- PT, flexeril, aleve PRN

## 2014-02-03 NOTE — Patient Instructions (Signed)
What is the TMJ? The temporomandibular (tem-PUH-ro-man-DIB-yoo-ler) joint, or the TMJ, connects the upper and lower jawbones. This joint allows the jaw to open wide and move back and forth when you chew, talk, or yawn.There are also several muscles that help this joint move. There can be muscle tightness and pain in the muscle that can cause several symptoms.  What causes TMJ pain? There are many causes of TMJ pain. Repeated chewing (for example, chewing gum) and clenching your teeth can cause pain in the joint. Some TMJ pain has no obvious cause. What can I do to ease the pain? There are many things you can do to help your pain get better. When you have pain:  Eat soft foods and stay away from chewy foods (for example, taffy) Try to use both sides of your mouth to chew Don't chew gum Don't open your mouth wide (for example, during yawning or singing) Don't bite your cheeks or fingernails Lower your amount of stress and worry Applying a warm, damp washcloth to the joint may help. Over-the-counter pain medicines such as ibuprofen (one brand: Advil) or acetaminophen (one brand: Tylenol) might also help. Do not use these medicines if you are allergic to them or if your doctor told you not to use them. How can I stop the pain from coming back? When your pain is better, you can do these exercises to make your muscles stronger and to keep the pain from coming back:  Resisted mouth opening: Place your thumb or two fingers under your chin and open your mouth slowly, pushing up lightly on your chin with your thumb. Hold for three to six seconds. Close your mouth slowly. Resisted mouth closing: Place your thumbs under your chin and your two index fingers on the ridge between your mouth and the bottom of your chin. Push down lightly on your chin as you close your mouth. Tongue up: Slowly open and close your mouth while keeping the tongue touching the roof of the mouth. Side-to-side jaw movement: Place an  object about one fourth of an inch thick (for example, two tongue depressors) between your front teeth. Slowly move your jaw from side to side. Increase the thickness of the object as the exercise becomes easier Forward jaw movement: Place an object about one fourth of an inch thick between your front teeth and move the bottom jaw forward so that the bottom teeth are in front of the top teeth. Increase the thickness of the object as the exercise becomes easier. These exercises should not be painful. If it hurts to do these exercises, stop doing them and talk to your family doctor.   Sciatica with Rehab The sciatic nerve runs from the back down the leg and is responsible for sensation and control of the muscles in the back (posterior) side of the thigh, lower leg, and foot. Sciatica is a condition that is characterized by inflammation of this nerve.  SYMPTOMS   Signs of nerve damage, including numbness and/or weakness along the posterior side of the lower extremity.  Pain in the back of the thigh that may also travel down the leg.  Pain that worsens when sitting for long periods of time.  Occasionally, pain in the back or buttock. CAUSES  Inflammation of the sciatic nerve is the cause of sciatica. The inflammation is due to something irritating the nerve. Common sources of irritation include:  Sitting for long periods of time.  Direct trauma to the nerve.  Arthritis of the spine.  Herniated or ruptured disk.  Slipping of the vertebrae (spondylolithesis)  Pressure from soft tissues, such as muscles or ligament-like tissue (fascia). RISK INCREASES WITH:  Sports that place pressure or stress on the spine (football or weightlifting).  Poor strength and flexibility.  Failure to warm-up properly before activity.  Family history of low back pain or disk disorders.  Previous back injury or surgery.  Poor body mechanics, especially when lifting, or poor posture. PREVENTION   Warm  up and stretch properly before activity.  Maintain physical fitness:  Strength, flexibility, and endurance.  Cardiovascular fitness.  Learn and use proper technique, especially with posture and lifting. When possible, have coach correct improper technique.  Avoid activities that place stress on the spine. PROGNOSIS If treated properly, then sciatica usually resolves within 6 weeks. However, occasionally surgery is necessary.  RELATED COMPLICATIONS   Permanent nerve damage, including pain, numbness, tingle, or weakness.  Chronic back pain.  Risks of surgery: infection, bleeding, nerve damage, or damage to surrounding tissues. TREATMENT Treatment initially involves resting from any activities that aggravate your symptoms. The use of ice and medication may help reduce pain and inflammation. The use of strengthening and stretching exercises may help reduce pain with activity. These exercises may be performed at home or with referral to a therapist. A therapist may recommend further treatments, such as transcutaneous electronic nerve stimulation (TENS) or ultrasound. Your caregiver may recommend corticosteroid injections to help reduce inflammation of the sciatic nerve. If symptoms persist despite non-surgical (conservative) treatment, then surgery may be recommended. MEDICATION  If pain medication is necessary, then nonsteroidal anti-inflammatory medications, such as aspirin and ibuprofen, or other minor pain relievers, such as acetaminophen, are often recommended.  Do not take pain medication for 7 days before surgery.  Prescription pain relievers may be given if deemed necessary by your caregiver. Use only as directed and only as much as you need.  Ointments applied to the skin may be helpful.  Corticosteroid injections may be given by your caregiver. These injections should be reserved for the most serious cases, because they may only be given a certain number of times. HEAT AND  COLD  Cold treatment (icing) relieves pain and reduces inflammation. Cold treatment should be applied for 10 to 15 minutes every 2 to 3 hours for inflammation and pain and immediately after any activity that aggravates your symptoms. Use ice packs or massage the area with a piece of ice (ice massage).  Heat treatment may be used prior to performing the stretching and strengthening activities prescribed by your caregiver, physical therapist, or athletic trainer. Use a heat pack or soak the injury in warm water. SEEK MEDICAL CARE IF:  Treatment seems to offer no benefit, or the condition worsens.  Any medications produce adverse side effects. EXERCISES  RANGE OF MOTION (ROM) AND STRETCHING EXERCISES - Sciatica Most people with sciatic will find that their symptoms worsen with either excessive bending forward (flexion) or arching at the low back (extension). The exercises which will help resolve your symptoms will focus on the opposite motion. Your physician, physical therapist or athletic trainer will help you determine which exercises will be most helpful to resolve your low back pain. Do not complete any exercises without first consulting with your clinician. Discontinue any exercises which worsen your symptoms until you speak to your clinician. If you have pain, numbness or tingling which travels down into your buttocks, leg or foot, the goal of the therapy is for these symptoms to move closer to  your back and eventually resolve. Occasionally, these leg symptoms will get better, but your low back pain may worsen; this is typically an indication of progress in your rehabilitation. Be certain to be very alert to any changes in your symptoms and the activities in which you participated in the 24 hours prior to the change. Sharing this information with your clinician will allow him/her to most efficiently treat your condition. These exercises may help you when beginning to rehabilitate your injury. Your  symptoms may resolve with or without further involvement from your physician, physical therapist or athletic trainer. While completing these exercises, remember:   Restoring tissue flexibility helps normal motion to return to the joints. This allows healthier, less painful movement and activity.  An effective stretch should be held for at least 30 seconds.  A stretch should never be painful. You should only feel a gentle lengthening or release in the stretched tissue. FLEXION RANGE OF MOTION AND STRETCHING EXERCISES: STRETCH  Flexion, Single Knee to Chest   Lie on a firm bed or floor with both legs extended in front of you.  Keeping one leg in contact with the floor, bring your opposite knee to your chest. Hold your leg in place by either grabbing behind your thigh or at your knee.  Pull until you feel a gentle stretch in your low back. Hold __________ seconds.  Slowly release your grasp and repeat the exercise with the opposite side. Repeat __________ times. Complete this exercise __________ times per day.  STRETCH  Flexion, Double Knee to Chest  Lie on a firm bed or floor with both legs extended in front of you.  Keeping one leg in contact with the floor, bring your opposite knee to your chest.  Tense your stomach muscles to support your back and then lift your other knee to your chest. Hold your legs in place by either grabbing behind your thighs or at your knees.  Pull both knees toward your chest until you feel a gentle stretch in your low back. Hold __________ seconds.  Tense your stomach muscles and slowly return one leg at a time to the floor. Repeat __________ times. Complete this exercise __________ times per day.  STRETCH  Low Trunk Rotation   Lie on a firm bed or floor. Keeping your legs in front of you, bend your knees so they are both pointed toward the ceiling and your feet are flat on the floor.  Extend your arms out to the side. This will stabilize your upper body  by keeping your shoulders in contact with the floor.  Gently and slowly drop both knees together to one side until you feel a gentle stretch in your low back. Hold for __________ seconds.  Tense your stomach muscles to support your low back as you bring your knees back to the starting position. Repeat the exercise to the other side. Repeat __________ times. Complete this exercise __________ times per day  EXTENSION RANGE OF MOTION AND FLEXIBILITY EXERCISES: STRETCH  Extension, Prone on Elbows  Lie on your stomach on the floor, a bed will be too soft. Place your palms about shoulder width apart and at the height of your head.  Place your elbows under your shoulders. If this is too painful, stack pillows under your chest.  Allow your body to relax so that your hips drop lower and make contact more completely with the floor.  Hold this position for __________ seconds.  Slowly return to lying flat on the floor. Repeat  __________ times. Complete this exercise __________ times per day.  RANGE OF MOTION  Extension, Prone Press Ups  Lie on your stomach on the floor, a bed will be too soft. Place your palms about shoulder width apart and at the height of your head.  Keeping your back as relaxed as possible, slowly straighten your elbows while keeping your hips on the floor. You may adjust the placement of your hands to maximize your comfort. As you gain motion, your hands will come more underneath your shoulders.  Hold this position __________ seconds.  Slowly return to lying flat on the floor. Repeat __________ times. Complete this exercise __________ times per day.  STRENGTHENING EXERCISES - Sciatica  These exercises may help you when beginning to rehabilitate your injury. These exercises should be done near your "sweet spot." This is the neutral, low-back arch, somewhere between fully rounded and fully arched, that is your least painful position. When performed in this safe range of motion,  these exercises can be used for people who have either a flexion or extension based injury. These exercises may resolve your symptoms with or without further involvement from your physician, physical therapist or athletic trainer. While completing these exercises, remember:   Muscles can gain both the endurance and the strength needed for everyday activities through controlled exercises.  Complete these exercises as instructed by your physician, physical therapist or athletic trainer. Progress with the resistance and repetition exercises only as your caregiver advises.  You may experience muscle soreness or fatigue, but the pain or discomfort you are trying to eliminate should never worsen during these exercises. If this pain does worsen, stop and make certain you are following the directions exactly. If the pain is still present after adjustments, discontinue the exercise until you can discuss the trouble with your clinician. STRENGTHENING Deep Abdominals, Pelvic Tilt   Lie on a firm bed or floor. Keeping your legs in front of you, bend your knees so they are both pointed toward the ceiling and your feet are flat on the floor.  Tense your lower abdominal muscles to press your low back into the floor. This motion will rotate your pelvis so that your tail bone is scooping upwards rather than pointing at your feet or into the floor.  With a gentle tension and even breathing, hold this position for __________ seconds. Repeat __________ times. Complete this exercise __________ times per day.  STRENGTHENING  Abdominals, Crunches   Lie on a firm bed or floor. Keeping your legs in front of you, bend your knees so they are both pointed toward the ceiling and your feet are flat on the floor. Cross your arms over your chest.  Slightly tip your chin down without bending your neck.  Tense your abdominals and slowly lift your trunk high enough to just clear your shoulder blades. Lifting higher can put  excessive stress on the low back and does not further strengthen your abdominal muscles.  Control your return to the starting position. Repeat __________ times. Complete this exercise __________ times per day.  STRENGTHENING  Quadruped, Opposite UE/LE Lift  Assume a hands and knees position on a firm surface. Keep your hands under your shoulders and your knees under your hips. You may place padding under your knees for comfort.  Find your neutral spine and gently tense your abdominal muscles so that you can maintain this position. Your shoulders and hips should form a rectangle that is parallel with the floor and is not twisted.  Keeping  your trunk steady, lift your right hand no higher than your shoulder and then your left leg no higher than your hip. Make sure you are not holding your breath. Hold this position __________ seconds.  Continuing to keep your abdominal muscles tense and your back steady, slowly return to your starting position. Repeat with the opposite arm and leg. Repeat __________ times. Complete this exercise __________ times per day.  STRENGTHENING  Abdominals and Quadriceps, Straight Leg Raise   Lie on a firm bed or floor with both legs extended in front of you.  Keeping one leg in contact with the floor, bend the other knee so that your foot can rest flat on the floor.  Find your neutral spine, and tense your abdominal muscles to maintain your spinal position throughout the exercise.  Slowly lift your straight leg off the floor about 6 inches for a count of 15, making sure to not hold your breath.  Still keeping your neutral spine, slowly lower your leg all the way to the floor. Repeat this exercise with each leg __________ times. Complete this exercise __________ times per day. POSTURE AND BODY MECHANICS CONSIDERATIONS - Sciatica Keeping correct posture when sitting, standing or completing your activities will reduce the stress put on different body tissues, allowing  injured tissues a chance to heal and limiting painful experiences. The following are general guidelines for improved posture. Your physician or physical therapist will provide you with any instructions specific to your needs. While reading these guidelines, remember:  The exercises prescribed by your provider will help you have the flexibility and strength to maintain correct postures.  The correct posture provides the optimal environment for your joints to work. All of your joints have less wear and tear when properly supported by a spine with good posture. This means you will experience a healthier, less painful body.  Correct posture must be practiced with all of your activities, especially prolonged sitting and standing. Correct posture is as important when doing repetitive low-stress activities (typing) as it is when doing a single heavy-load activity (lifting). RESTING POSITIONS Consider which positions are most painful for you when choosing a resting position. If you have pain with flexion-based activities (sitting, bending, stooping, squatting), choose a position that allows you to rest in a less flexed posture. You would want to avoid curling into a fetal position on your side. If your pain worsens with extension-based activities (prolonged standing, working overhead), avoid resting in an extended position such as sleeping on your stomach. Most people will find more comfort when they rest with their spine in a more neutral position, neither too rounded nor too arched. Lying on a non-sagging bed on your side with a pillow between your knees, or on your back with a pillow under your knees will often provide some relief. Keep in mind, being in any one position for a prolonged period of time, no matter how correct your posture, can still lead to stiffness. PROPER SITTING POSTURE In order to minimize stress and discomfort on your spine, you must sit with correct posture Sitting with good posture should  be effortless for a healthy body. Returning to good posture is a gradual process. Many people can work toward this most comfortably by using various supports until they have the flexibility and strength to maintain this posture on their own. When sitting with proper posture, your ears will fall over your shoulders and your shoulders will fall over your hips. You should use the back of the chair  to support your upper back. Your low back will be in a neutral position, just slightly arched. You may place a small pillow or folded towel at the base of your low back for support.  When working at a desk, create an environment that supports good, upright posture. Without extra support, muscles fatigue and lead to excessive strain on joints and other tissues. Keep these recommendations in mind: CHAIR:   A chair should be able to slide under your desk when your back makes contact with the back of the chair. This allows you to work closely.  The chair's height should allow your eyes to be level with the upper part of your monitor and your hands to be slightly lower than your elbows. BODY POSITION  Your feet should make contact with the floor. If this is not possible, use a foot rest.  Keep your ears over your shoulders. This will reduce stress on your neck and low back. INCORRECT SITTING POSTURES   If you are feeling tired and unable to assume a healthy sitting posture, do not slouch or slump. This puts excessive strain on your back tissues, causing more damage and pain. Healthier options include:  Using more support, like a lumbar pillow.  Switching tasks to something that requires you to be upright or walking.  Talking a brief walk.  Lying down to rest in a neutral-spine position. PROLONGED STANDING WHILE SLIGHTLY LEANING FORWARD  When completing a task that requires you to lean forward while standing in one place for a long time, place either foot up on a stationary 2-4 inch high object to help  maintain the best posture. When both feet are on the ground, the low back tends to lose its slight inward curve. If this curve flattens (or becomes too large), then the back and your other joints will experience too much stress, fatigue more quickly and can cause pain.  CORRECT STANDING POSTURES Proper standing posture should be assumed with all daily activities, even if they only take a few moments, like when brushing your teeth. As in sitting, your ears should fall over your shoulders and your shoulders should fall over your hips. You should keep a slight tension in your abdominal muscles to brace your spine. Your tailbone should point down to the ground, not behind your body, resulting in an over-extended swayback posture.  INCORRECT STANDING POSTURES  Common incorrect standing postures include a forward head, locked knees and/or an excessive swayback. WALKING Walk with an upright posture. Your ears, shoulders and hips should all line-up. PROLONGED ACTIVITY IN A FLEXED POSITION When completing a task that requires you to bend forward at your waist or lean over a low surface, try to find a way to stabilize 3 of 4 of your limbs. You can place a hand or elbow on your thigh or rest a knee on the surface you are reaching across. This will provide you more stability so that your muscles do not fatigue as quickly. By keeping your knees relaxed, or slightly bent, you will also reduce stress across your low back. CORRECT LIFTING TECHNIQUES DO :   Assume a wide stance. This will provide you more stability and the opportunity to get as close as possible to the object which you are lifting.  Tense your abdominals to brace your spine; then bend at the knees and hips. Keeping your back locked in a neutral-spine position, lift using your leg muscles. Lift with your legs, keeping your back straight.  Test the weight  of unknown objects before attempting to lift them.  Try to keep your elbows locked down at your  sides in order get the best strength from your shoulders when carrying an object.  Always ask for help when lifting heavy or awkward objects. INCORRECT LIFTING TECHNIQUES DO NOT:   Lock your knees when lifting, even if it is a small object.  Bend and twist. Pivot at your feet or move your feet when needing to change directions.  Assume that you cannot safely pick up a paperclip without proper posture. Document Released: 11/07/2005 Document Revised: 01/30/2012 Document Reviewed: 02/19/2009 [facility] [employer] Patient Information 2014 [place], [place].

## 2014-02-17 ENCOUNTER — Other Ambulatory Visit: Payer: Self-pay | Admitting: Emergency Medicine

## 2014-02-20 ENCOUNTER — Ambulatory Visit (INDEPENDENT_AMBULATORY_CARE_PROVIDER_SITE_OTHER): Payer: BC Managed Care – PPO | Admitting: Emergency Medicine

## 2014-02-20 ENCOUNTER — Encounter: Payer: Self-pay | Admitting: Emergency Medicine

## 2014-02-20 VITALS — BP 112/80 | Temp 98.2°F | Resp 16 | Ht 61.5 in | Wt 156.0 lb

## 2014-02-20 DIAGNOSIS — Z0000 Encounter for general adult medical examination without abnormal findings: Secondary | ICD-10-CM

## 2014-02-20 DIAGNOSIS — R5381 Other malaise: Secondary | ICD-10-CM

## 2014-02-20 DIAGNOSIS — R7309 Other abnormal glucose: Secondary | ICD-10-CM

## 2014-02-20 DIAGNOSIS — E782 Mixed hyperlipidemia: Secondary | ICD-10-CM

## 2014-02-20 DIAGNOSIS — E559 Vitamin D deficiency, unspecified: Secondary | ICD-10-CM

## 2014-02-20 DIAGNOSIS — E039 Hypothyroidism, unspecified: Secondary | ICD-10-CM

## 2014-02-20 DIAGNOSIS — Z79899 Other long term (current) drug therapy: Secondary | ICD-10-CM

## 2014-02-20 DIAGNOSIS — J309 Allergic rhinitis, unspecified: Secondary | ICD-10-CM

## 2014-02-20 DIAGNOSIS — R5383 Other fatigue: Secondary | ICD-10-CM

## 2014-02-20 LAB — CBC WITH DIFFERENTIAL/PLATELET
Basophils Absolute: 0 10*3/uL (ref 0.0–0.1)
Basophils Relative: 0 % (ref 0–1)
Eosinophils Absolute: 0.1 10*3/uL (ref 0.0–0.7)
Eosinophils Relative: 1 % (ref 0–5)
HCT: 42.2 % (ref 36.0–46.0)
Hemoglobin: 14.6 g/dL (ref 12.0–15.0)
LYMPHS PCT: 25 % (ref 12–46)
Lymphs Abs: 2 10*3/uL (ref 0.7–4.0)
MCH: 28.7 pg (ref 26.0–34.0)
MCHC: 34.6 g/dL (ref 30.0–36.0)
MCV: 83.1 fL (ref 78.0–100.0)
Monocytes Absolute: 0.5 10*3/uL (ref 0.1–1.0)
Monocytes Relative: 6 % (ref 3–12)
NEUTROS ABS: 5.4 10*3/uL (ref 1.7–7.7)
NEUTROS PCT: 68 % (ref 43–77)
PLATELETS: 186 10*3/uL (ref 150–400)
RBC: 5.08 MIL/uL (ref 3.87–5.11)
RDW: 13.2 % (ref 11.5–15.5)
WBC: 7.9 10*3/uL (ref 4.0–10.5)

## 2014-02-20 LAB — HEPATIC FUNCTION PANEL
ALK PHOS: 79 U/L (ref 39–117)
ALT: 19 U/L (ref 0–35)
AST: 17 U/L (ref 0–37)
Albumin: 4.7 g/dL (ref 3.5–5.2)
BILIRUBIN DIRECT: 0.1 mg/dL (ref 0.0–0.3)
BILIRUBIN TOTAL: 0.6 mg/dL (ref 0.2–1.2)
Indirect Bilirubin: 0.5 mg/dL (ref 0.2–1.2)
Total Protein: 7.5 g/dL (ref 6.0–8.3)

## 2014-02-20 LAB — BASIC METABOLIC PANEL WITH GFR
BUN: 8 mg/dL (ref 6–23)
CHLORIDE: 100 meq/L (ref 96–112)
CO2: 28 mEq/L (ref 19–32)
Calcium: 9.8 mg/dL (ref 8.4–10.5)
Creat: 0.63 mg/dL (ref 0.50–1.10)
GFR, Est African American: >89 mL/min
Glucose, Bld: 82 mg/dL (ref 70–99)
POTASSIUM: 4.1 meq/L (ref 3.5–5.3)
Sodium: 136 mEq/L (ref 135–145)

## 2014-02-20 LAB — LIPID PANEL
CHOLESTEROL: 173 mg/dL (ref 0–200)
HDL: 49 mg/dL (ref >39)
LDL Cholesterol: 98 mg/dL (ref 0–99)
Total CHOL/HDL Ratio: 3.5 Ratio
Triglycerides: 128 mg/dL (ref <150)
VLDL: 26 mg/dL (ref 0–40)

## 2014-02-20 LAB — VITAMIN B12: Vitamin B-12: 958 pg/mL — ABNORMAL HIGH (ref 211–911)

## 2014-02-20 LAB — HEMOGLOBIN A1C
HEMOGLOBIN A1C: 5.3 % (ref <5.7)
MEAN PLASMA GLUCOSE: 105 mg/dL (ref <117)

## 2014-02-20 LAB — MAGNESIUM: Magnesium: 2 mg/dL (ref 1.5–2.5)

## 2014-02-20 LAB — TSH: TSH: 1.512 u[IU]/mL (ref 0.350–4.500)

## 2014-02-20 MED ORDER — FLUTICASONE PROPIONATE 50 MCG/ACT NA SUSP: 2.0000 | Freq: Every day | NASAL | Status: DC

## 2014-02-20 NOTE — Patient Instructions (Signed)
We want weight loss that will last so you should lose 1-2 pounds a week.  THAT IS IT! Please pick THREE things a month to change. Once it is a habit check off the item. Then pick another three items off the list to become habits.  If you are already doing a habit on the list GREAT!  Cross that item off! o Don't drink your calories. Ie, alcohol, soda, fruit juice, and sweet tea.  o Drink more water. Drink a glass when you feel hungry or before each meal.  o Eat breakfast - Complex carb and protein (likeDannon light and fit yogurt, oatmeal, fruit, eggs, turkey bacon). o Measure your cereal.  Eat no more than one cup a day. (ie Kashi) o Eat an apple a day. o Add a vegetable a day. o Try a new vegetable a month. o Use Pam! Stop using oil or butter to cook. o Don't finish your plate or use smaller plates. o Share your dessert. o Eat sugar free Jello for dessert or frozen grapes. o Don't eat 2-3 hours before bed. o Switch to whole wheat bread, pasta, and brown rice. o Make healthier choices when you eat out. No fries! o Pick baked chicken, NOT fried. o Don't forget to SLOW DOWN when you eat. It is not going anywhere.  o Take the stairs. o Park far away in the parking lot o Lift soup cans (or weights) for 10 minutes while watching TV. o Walk at work for 10 minutes during break. o Walk outside 1 time a week with your friend, kids, dog, or significant other. o Start a walking group at church. o Walk the mall as much as you can tolerate.  o Keep a food diary. o Weigh yourself daily. o Walk for 15 minutes 3 days per week. o Cook at home more often and eat out less.  If life happens and you go back to old habits, it is okay.  Just start over. You can do it!   If you experience chest pain, get short of breath, or tired during the exercise, please stop immediately and inform your doctor.    Bad carbs also include fruit juice, alcohol, and sweet tea. These are empty calories that do not signal to  your brain that you are full.   Please remember the good carbs are still carbs which convert into sugar. So please measure them out no more than 1/2-1 cup of rice, oatmeal, pasta, and beans.  Veggies are however free foods! Pile them on.   I like lean protein at every meal such as chicken, turkey, pork chops, cottage cheese, etc. Just do not fry these meats and please center your meal around vegetable, the meats should be a side dish.   No all fruit is created equal. Please see the list below, the fruit at the bottom is higher in sugars than the fruit at the top   Allergic Rhinitis Allergic rhinitis is when the mucous membranes in the nose respond to allergens. Allergens are particles in the air that cause your body to have an allergic reaction. This causes you to release allergic antibodies. Through a chain of events, these eventually cause you to release histamine into the blood stream. Although meant to protect the body, it is this release of histamine that causes your discomfort, such as frequent sneezing, congestion, and an itchy, runny nose.  CAUSES  Seasonal allergic rhinitis (hay fever) is caused by pollen allergens that may come from grasses, trees,   and weeds. Year-round allergic rhinitis (perennial allergic rhinitis) is caused by allergens such as house dust mites, pet dander, and mold spores.  SYMPTOMS   Nasal stuffiness (congestion).  Itchy, runny nose with sneezing and tearing of the eyes. DIAGNOSIS  Your health care provider can help you determine the allergen or allergens that trigger your symptoms. If you and your health care provider are unable to determine the allergen, skin or blood testing may be used. TREATMENT  Allergic Rhinitis does not have a cure, but it can be controlled by:  Medicines and allergy shots (immunotherapy).  Avoiding the allergen. Hay fever may often be treated with antihistamines in pill or nasal spray forms. Antihistamines block the effects of  histamine. There are over-the-counter medicines that may help with nasal congestion and swelling around the eyes. Check with your health care provider before taking or giving this medicine.  If avoiding the allergen or the medicine prescribed do not work, there are many new medicines your health care provider can prescribe. Stronger medicine may be used if initial measures are ineffective. Desensitizing injections can be used if medicine and avoidance does not work. Desensitization is when a patient is given ongoing shots until the body becomes less sensitive to the allergen. Make sure you follow up with your health care provider if problems continue. HOME CARE INSTRUCTIONS It is not possible to completely avoid allergens, but you can reduce your symptoms by taking steps to limit your exposure to them. It helps to know exactly what you are allergic to so that you can avoid your specific triggers. SEEK MEDICAL CARE IF:   You have a fever.  You develop a cough that does not stop easily (persistent).  You have shortness of breath.  You start wheezing.  Symptoms interfere with normal daily activities. Document Released: 08/02/2001 Document Revised: 08/28/2013 Document Reviewed: 07/15/2013 [employer] Patient Information 2014 [employer].  

## 2014-02-20 NOTE — Progress Notes (Signed)
Subjective:    Patient ID: Lauren PettiesMandy E [name], female    DOB: 07/28/1981, 33 y.o.   MRN: [id number]  HPI Comments: 33 yo overweight WF CPE and presents for 3 month F/U for Cholesterol, Pre-Dm, D. Deficient.  She is trying to lose weight and is eating healthier. She is exercising a little more. She was 189 # and is now 156#. She is on metformin for weight loss and insulin, she is not a diabetic. She has not been taking thyroid AD. She has increased allergy drainage for over a week. She notes yellow/ green production has increased x 1 day. She is taking mucinex and zyrtec, occasionally nose spray. She notes mild dizziness with increased sinus pressure. She notes tongue has decreased sensation. She was diagnosed with TMJ and uses mouth guard and muscle relaxer which helps some. She occasionally has relux.  She has chronic fatigue and occasionally episodes where she feels she cannot move. She has seen multiple specialists with out diagnosis achieved. She has had multiple labs/ scans all have been primarily WNL. She notes episodes have improved overall and she is overall feeling improved with better diet and weight loss.  She has chronic allergy drainage and recurrent sinusitis. She has also been diagnosed with TMJ and notes mild improvement with symptoms but has f/u scheduled with Dentist.  She notes metamucil has helped some with constipation and is trying to increase H2o. She denies blood in stools.   Hyperlipidemia  Anxiety       Medication List       This list is accurate as of: 02/20/14 11:59 PM.  Always use your most recent med list.               cetirizine 10 MG tablet  Commonly known as:  ZYRTEC  Take 10 mg by mouth daily.     [employer] B-12 1500 MCG Tbdp  Generic drug:  Cyanocobalamin  Take 1,500 mcg by mouth daily.     cyclobenzaprine 10 MG tablet  Commonly known as:  FLEXERIL  1-2 at night for jaw pain/headache     FLUoxetine 40 MG capsule  Commonly known as:  PROZAC  Take  40 mg by mouth daily.     fluticasone 50 MCG/ACT nasal spray  Commonly known as:  FLONASE  Place 2 sprays into both nostrils daily.     guaiFENesin 600 MG 12 hr tablet  Commonly known as:  MUCINEX  Take by mouth 2 (two) times daily.     ibuprofen 200 MG tablet  Commonly known as:  ADVIL,MOTRIN  Take 200 mg by mouth every 6 (six) hours as needed for pain. Takes 3 BID     levothyroxine 100 MCG tablet  Commonly known as:  SYNTHROID, LEVOTHROID  Take 100 mcg by mouth daily. Takes 1 tablet T,Thurs,Sat, Sun and 1/2 = 50 mcg M,W,F     metFORMIN 500 MG 24 hr tablet  Commonly known as:  GLUCOPHAGE-XR  TAKE 1 TABLET (500 MG TOTAL) BY MOUTH DAILY.     metroNIDAZOLE 0.75 % vaginal gel  Commonly known as:  METROGEL VAGINAL  Place 1 Applicatorful vaginally 1 day or 1 dose.     pravastatin 40 MG tablet  Commonly known as:  PRAVACHOL  Take 40 mg by mouth daily.     psyllium 0.52 G capsule  Commonly known as:  REGULOID  Take 0.52 g by mouth daily.     vitamin C 1000 MG tablet  Take 1,000 mg by mouth daily.  Vitamin D 2000 UNITS Caps  Take 8,000 Units by mouth daily.       Allergies  Allergen Reactions  . Doxycycline Other (See Comments)    GI UPSET  . Effexor [Venlafaxine] Other (See Comments)    DYSP         DYSPHORIA  . Prednisone Other (See Comments)    FLUSHED  . Savella [Milnacipran Hcl] Other (See Comments)    NO RELIEF   Past Medical History  Diagnosis Date  . Depression   . Hypothyroidism   . HSV-1 infection   . Fibromyalgia   . Preeclampsia   . Hypertension   . Anxiety   . Hemorrhoids   . Constipation   . GERD (gastroesophageal reflux disease)   . Other abnormal glucose   . Anemia    Past Surgical History  Procedure Laterality Date  . Cholecystectomy  2006  . Tonsillectomy    . Wisdom tooth extraction     History  Substance Use Topics  . Smoking status: Never Smoker   . Smokeless tobacco: Never Used  . Alcohol Use: Yes     Comment:  occ   Family History  Problem Relation Age of Onset  . Heart disease Maternal Grandmother   . Diabetes Maternal Grandmother   . Cancer Maternal Grandmother     SKIN  . Hyperlipidemia Maternal Grandmother   . Hypertension Maternal Grandmother   . Hypothyroidism Mother   . Cervical cancer Mother   . Bipolar disorder Mother   . Diabetes Mother   . Colon polyps Mother   . Bipolar disorder Brother   . Kidney disease Brother   . Alcohol abuse Brother   . Lung cancer Maternal Grandfather   . Heart disease Maternal Aunt   . Diabetes Maternal Aunt   . Diabetes Cousin   . Colon cancer Neg Hx    MAINTENANCE: Colonoscopy: 2012 Mammo:n/a BMD:n/a Pap/ Pelvic: 11/2012 [id number] Dentist:q 6 month  IMMUNIZATIONS: Tdap:3/ 2012 Pneumovax: Zostavax:n/a Influenza: 2014  Patient Care Team: Lucky Cowboy, MD as PCP - General (Internal Medicine) Fredrich Birks as Referring Physician (Optometry) Sherian Rein, MD as Consulting Physician (Obstetrics and Gynecology) Marlowe Shores, MD as Consulting Physician (Orthopedic Surgery) Elvina Sidle (Neurology) Noralee Stain, MD as Consulting Physician (Dermatology) Susy Frizzle, MD as Consulting Physician (Rheumatology) Karl Ito, (Dentist)   Review of Systems  Constitutional: Positive for fatigue.  HENT: Positive for postnasal drip.   Gastrointestinal: Positive for constipation.  Neurological: Positive for weakness.  All other systems reviewed and are negative.   BP 112/80  Temp(Src) 98.2 F (36.8 C) (Temporal)  Resp 16  Ht 5' 1.5" (1.562 m)  Wt 156 lb (70.761 kg)  BMI 29.00 kg/m2  LMP 02/15/2014     Objective:   Physical Exam  Nursing note and vitals reviewed. Constitutional: She is oriented to person, place, and time. She appears well-developed and well-nourished. No distress.  overweight  HENT:  Head: Normocephalic and atraumatic.  Right Ear: External ear normal.  Left Ear: External ear normal.  Nose:  Nose normal.  Mouth/Throat: Oropharynx is clear and moist. No oropharyngeal exudate.  Eyes: Conjunctivae and EOM are normal. Pupils are equal, round, and reactive to light. Right eye exhibits no discharge. Left eye exhibits no discharge. No scleral icterus.  Neck: Normal range of motion. Neck supple. No JVD present. No tracheal deviation present. No thyromegaly present.  Cardiovascular: Normal rate, regular rhythm, normal heart sounds and intact distal pulses.   Pulmonary/Chest: Effort normal and breath  sounds normal.  Abdominal: Soft. Bowel sounds are normal. She exhibits no distension and no mass. There is no tenderness. There is no rebound and no guarding.  Genitourinary:  DEF GYN  Musculoskeletal: Normal range of motion. She exhibits no edema and no tenderness.  Lymphadenopathy:    She has no cervical adenopathy.  Neurological: She is alert and oriented to person, place, and time. She has normal reflexes. No cranial nerve deficit. She exhibits normal muscle tone. Coordination normal.  Skin: Skin is warm and dry. No rash noted. No erythema. No pallor.  Psychiatric: She has a normal mood and affect. Her behavior is normal. Judgment and thought content normal.      EKG NSCSPT WNL     Assessment & Plan:  1.CPE- Update screening labs/ History/ Immunizations/ Testing as needed. Advised healthy diet, QD exercise, increase H20 and continue RX/ Vitamins AD.  2. 3 month F/U for Cholesterol, Pre-Dm, D. Deficient. Needs healthy diet, cardio QD and obtain healthy weight. Check Labs, Check BP if >130/80 call office. May decrease medications if labs continue to improve with improved diet/ weight loss  3. Chronic Fatigue with negative auto immune workup and multiple referrals for fatigue and "paralysis type" episodes- check labs, increase activity and H2O, if symptoms increase needs re-evaluation with neuro.  4. Allergic rhinitis- Allegra OTC, increase H2o, allergy hygiene explained.  5.  Constipation- Increase fiber/ water intake, decrease caffeine, increase activity level, can add Miralax or Metamucil QD until BM soft

## 2014-02-21 LAB — URINALYSIS, ROUTINE W REFLEX MICROSCOPIC
BILIRUBIN URINE: NEGATIVE
Glucose, UA: NEGATIVE mg/dL
Hgb urine dipstick: NEGATIVE
Ketones, ur: NEGATIVE mg/dL
Leukocytes, UA: NEGATIVE
Nitrite: NEGATIVE
PH: 6 (ref 5.0–8.0)
PROTEIN: NEGATIVE mg/dL
Specific Gravity, Urine: 1.018 (ref 1.005–1.030)
Urobilinogen, UA: 0.2 mg/dL (ref 0.0–1.0)

## 2014-02-21 LAB — VITAMIN D 25 HYDROXY (VIT D DEFICIENCY, FRACTURES): Vit D, 25-Hydroxy: >120 ng/mL — ABNORMAL HIGH (ref 30–89)

## 2014-02-21 LAB — MICROALBUMIN / CREATININE URINE RATIO
Creatinine, Urine: 154.4 mg/dL
MICROALB/CREAT RATIO: 5.2 mg/g (ref 0.0–30.0)
Microalb, Ur: 0.81 mg/dL (ref 0.00–1.89)

## 2014-02-21 LAB — INSULIN, FASTING: INSULIN FASTING, SERUM: 4 u[IU]/mL (ref 3–28)

## 2014-02-22 LAB — URINE CULTURE: Colony Count: 15000

## 2014-02-27 ENCOUNTER — Other Ambulatory Visit: Payer: Self-pay | Admitting: Emergency Medicine

## 2014-02-27 DIAGNOSIS — H9209 Otalgia, unspecified ear: Secondary | ICD-10-CM

## 2014-03-05 ENCOUNTER — Encounter: Payer: Self-pay | Admitting: Physician Assistant

## 2014-03-05 ENCOUNTER — Ambulatory Visit (INDEPENDENT_AMBULATORY_CARE_PROVIDER_SITE_OTHER): Payer: BC Managed Care – PPO | Admitting: Physician Assistant

## 2014-03-05 VITALS — BP 110/70 | HR 80 | Temp 97.9°F | Resp 16 | Ht 61.0 in | Wt 155.0 lb

## 2014-03-05 DIAGNOSIS — K644 Residual hemorrhoidal skin tags: Secondary | ICD-10-CM

## 2014-03-05 LAB — CBC WITH DIFFERENTIAL/PLATELET
BASOS ABS: 0 10*3/uL (ref 0.0–0.1)
BASOS PCT: 0 % (ref 0–1)
Eosinophils Absolute: 0.1 10*3/uL (ref 0.0–0.7)
Eosinophils Relative: 1 % (ref 0–5)
HEMATOCRIT: 39.3 % (ref 36.0–46.0)
HEMOGLOBIN: 13.3 g/dL (ref 12.0–15.0)
LYMPHS PCT: 31 % (ref 12–46)
Lymphs Abs: 2.4 10*3/uL (ref 0.7–4.0)
MCH: 28.1 pg (ref 26.0–34.0)
MCHC: 33.8 g/dL (ref 30.0–36.0)
MCV: 82.9 fL (ref 78.0–100.0)
MONO ABS: 0.6 10*3/uL (ref 0.1–1.0)
MONOS PCT: 7 % (ref 3–12)
NEUTROS ABS: 4.8 10*3/uL (ref 1.7–7.7)
Neutrophils Relative %: 61 % (ref 43–77)
Platelets: 240 10*3/uL (ref 150–400)
RBC: 4.74 MIL/uL (ref 3.87–5.11)
RDW: 13.2 % (ref 11.5–15.5)
WBC: 7.9 10*3/uL (ref 4.0–10.5)

## 2014-03-05 LAB — BASIC METABOLIC PANEL WITH GFR
BUN: 12 mg/dL (ref 6–23)
CO2: 29 mEq/L (ref 19–32)
Calcium: 9.3 mg/dL (ref 8.4–10.5)
Chloride: 105 mEq/L (ref 96–112)
Creat: 0.79 mg/dL (ref 0.50–1.10)
Glucose, Bld: 79 mg/dL (ref 70–99)
POTASSIUM: 4.1 meq/L (ref 3.5–5.3)
SODIUM: 139 meq/L (ref 135–145)

## 2014-03-05 LAB — HEPATIC FUNCTION PANEL
ALT: 12 U/L (ref 0–35)
AST: 13 U/L (ref 0–37)
Albumin: 4.3 g/dL (ref 3.5–5.2)
Alkaline Phosphatase: 67 U/L (ref 39–117)
BILIRUBIN DIRECT: 0.1 mg/dL (ref 0.0–0.3)
Indirect Bilirubin: 0.3 mg/dL (ref 0.2–1.2)
Total Bilirubin: 0.4 mg/dL (ref 0.2–1.2)
Total Protein: 7 g/dL (ref 6.0–8.3)

## 2014-03-05 MED ORDER — HYDROCORTISONE ACETATE 25 MG RE SUPP: 25.0000 mg | Freq: Two times a day (BID) | RECTAL | Status: DC

## 2014-03-05 MED ORDER — ACETAMINOPHEN-CODEINE #3 300-30 MG PO TABS: ORAL_TABLET | ORAL | Status: DC

## 2014-03-05 NOTE — Progress Notes (Signed)
   Subjective:    Patient ID: Lauren PettiesMandy E Baril, female    DOB: 09/01/1981, 33 y.o.   MRN: [id number]  Rectal Bleeding  Episode onset: 2 weeks, worse yesterday. The problem has been gradually worsening. The stool is described as hard (TSH has not been in range). Prior successful therapies include diet changes, fiber and stool softeners. Associated symptoms include hemorrhoids and rectal pain. Pertinent negatives include no anorexia, no fever, no abdominal pain, no diarrhea, no hematemesis, no nausea, no vomiting, no hematuria, no vaginal bleeding, no vaginal discharge, no chest pain, no headaches, no coughing, no difficulty breathing and no rash. She has been eating and drinking normally. Past medical history comments: hypothyroidism. There were no sick contacts.     Review of Systems  Constitutional: Negative.  Negative for fever.  HENT: Negative.   Respiratory: Negative.  Negative for cough.   Cardiovascular: Negative.  Negative for chest pain.  Gastrointestinal: Positive for constipation, blood in stool, hematochezia, anal bleeding, rectal pain and hemorrhoids. Negative for nausea, vomiting, abdominal pain, diarrhea, abdominal distention, anorexia and hematemesis.  Genitourinary: Negative.  Negative for hematuria, vaginal bleeding and vaginal discharge.  Musculoskeletal: Negative.   Skin: Negative for rash.  Neurological: Negative for dizziness and headaches.       Objective:   Physical Exam  Constitutional: She is oriented to person, place, and time. She appears well-developed and well-nourished.  HENT:  Head: Normocephalic and atraumatic.  Right Ear: External ear normal.  Left Ear: External ear normal.  Mouth/Throat: Oropharynx is clear and moist.  Eyes: Conjunctivae and EOM are normal. Pupils are equal, round, and reactive to light.  Neck: Normal range of motion. Neck supple. No thyromegaly present.  Cardiovascular: Normal rate, regular rhythm and normal heart sounds.  Exam reveals  no gallop and no friction rub.   No murmur heard. Pulmonary/Chest: Effort normal and breath sounds normal. No respiratory distress. She has no wheezes.  Abdominal: Soft. Bowel sounds are normal. She exhibits no distension and no mass. There is tenderness (RLQ). There is no rebound and no guarding.  Genitourinary: Rectal exam shows external hemorrhoid, mass and tenderness. Rectal exam shows no internal hemorrhoid and no fissure. Guaiac positive stool.  Gross blood at rectum, hard tender nodule at 12 oclock, unable to do full rectal exam due to pain.   Musculoskeletal: Normal range of motion.  Lymphadenopathy:    She has no cervical adenopathy.  Neurological: She is alert and oriented to person, place, and time. She displays normal reflexes. No cranial nerve deficit. Coordination normal.  Skin: Skin is warm and dry.  Psychiatric: She has a normal mood and affect.      Assessment & Plan:  Thrombosed external hemorrhoids ? Hard nodule- will treat for symptoms and refer to GI Sitz baths, increase fiber, increase water, can sit on donut, check CBC, BMP,  Hydrocortisone supp given. If worse go to the ER.

## 2014-03-05 NOTE — Patient Instructions (Signed)
Hemorrhoids Hemorrhoids are swollen veins around the rectum or anus. There are two types of hemorrhoids:   Internal hemorrhoids. These occur in the veins just inside the rectum. They may poke through to the outside and become irritated and painful.  External hemorrhoids. These occur in the veins outside the anus and can be felt as a painful swelling or hard lump near the anus. CAUSES  Pregnancy.   Obesity.   Constipation or diarrhea.   Straining to have a bowel movement.   Sitting for long periods on the toilet.  Heavy lifting or other activity that caused you to strain.  Anal intercourse. SYMPTOMS   Pain.   Anal itching or irritation.   Rectal bleeding.   Fecal leakage.   Anal swelling.   One or more lumps around the anus.  DIAGNOSIS  Your caregiver may be able to diagnose hemorrhoids by visual examination. Other examinations or tests that may be performed include:   Examination of the rectal area with a gloved hand (digital rectal exam).   Examination of anal canal using a small tube (scope).   A blood test if you have lost a significant amount of blood.  A test to look inside the colon (sigmoidoscopy or colonoscopy). TREATMENT Most hemorrhoids can be treated at home. However, if symptoms do not seem to be getting better or if you have a lot of rectal bleeding, your caregiver may perform a procedure to help make the hemorrhoids get smaller or remove them completely. Possible treatments include:   Placing a rubber band at the base of the hemorrhoid to cut off the circulation (rubber band ligation).   Injecting a chemical to shrink the hemorrhoid (sclerotherapy).   Using a tool to burn the hemorrhoid (infrared light therapy).   Surgically removing the hemorrhoid (hemorrhoidectomy).   Stapling the hemorrhoid to block blood flow to the tissue (hemorrhoid stapling).  HOME CARE INSTRUCTIONS   Eat foods with fiber, such as whole grains, beans,  nuts, fruits, and vegetables. Ask your doctor about taking products with added fiber in them (fibersupplements).  Increase fluid intake. Drink enough water and fluids to keep your urine clear or pale yellow.   Exercise regularly.   Go to the bathroom when you have the urge to have a bowel movement. Do not wait.   Avoid straining to have bowel movements.   Keep the anal area dry and clean. Use wet toilet paper or moist towelettes after a bowel movement.   Medicated creams and suppositories may be used or applied as directed.   Only take over-the-counter or prescription medicines as directed by your caregiver.   Take warm sitz baths for 15 20 minutes, 3 4 times a day to ease pain and discomfort.   Place ice packs on the hemorrhoids if they are tender and swollen. Using ice packs between sitz baths may be helpful.   Put ice in a plastic bag.   Place a towel between your skin and the bag.   Leave the ice on for 15 20 minutes, 3 4 times a day.   Do not sit on the toilet for long periods. This increases blood pooling and pain.  SEEK MEDICAL CARE IF:  You have increasing pain and swelling that is not controlled by treatment or medicine.  You have uncontrolled bleeding.  You have difficulty or you are unable to have a bowel movement.  You have pain or inflammation outside the area of the hemorrhoids. MAKE SURE YOU:  Understand these instructions.  Will watch your condition.  Will get help right away if you are not doing well or get worse. Document Released: 11/04/2000 Document Revised: 10/24/2012 Document Reviewed: 09/11/2012 [facility] Patient Information 2014 [place], [place].

## 2014-03-10 ENCOUNTER — Encounter: Payer: Self-pay | Admitting: Nurse Practitioner

## 2014-03-10 ENCOUNTER — Ambulatory Visit (INDEPENDENT_AMBULATORY_CARE_PROVIDER_SITE_OTHER): Payer: BC Managed Care – PPO | Admitting: Nurse Practitioner

## 2014-03-10 VITALS — BP 100/76 | HR 66 | Ht 61.0 in | Wt 153.8 lb

## 2014-03-10 DIAGNOSIS — K625 Hemorrhage of anus and rectum: Secondary | ICD-10-CM

## 2014-03-10 DIAGNOSIS — K602 Anal fissure, unspecified: Secondary | ICD-10-CM | POA: Insufficient documentation

## 2014-03-10 MED ORDER — DILTIAZEM GEL 2 %: CUTANEOUS | Status: DC

## 2014-03-10 NOTE — Progress Notes (Addendum)
     History of Present Illness:  Patient is a 33 year old female evaluated early October 2012 by Dr. Rhea BeltonPyrtle for rectal bleeding and lower abdominal pain. Patient was prescribed treatment for hemorrhoids and advised to have a colonoscopy. Patient had [facility] as it was less expensive for her from an insurance standpoint I have requested that report. Patient tells me the colonoscopy was normal except for hemorrhoids which were banded at the time. Patient did well post procedure until about one month ago when she developed recurrent rectal bleeding, this time associated with rectal pain. She has bright red blood with bowel movements and throughout the day leaks a dark reddish brown, foul-smelling material from rectum. No vaginal discharge. She saw PCP on Thursday and was prescribed steroid suppositories and sitz baths. Since then the bleeding has slightly improved but the pain and malodorous rectal discharge have not. She does not want to return [facility] treatment, regardless of cost.  Current Medications, Allergies, Past Medical History, Past Surgical History, Family History and Social History were reviewed [facility] electronic [facility] record.   Physical Exam: General: Pleasant, well developed , white female in no acute distress Head: Normocephalic and atraumatic Eyes:  sclerae anicteric, conjunctiva pink  Ears: Normal auditory acuity Lungs: Clear throughout to auscultation Heart: Regular rate and rhythm Abdomen: Soft, non distended, non-tender. No masses, no hepatomegaly. Normal bowel sounds Rectal: Inflamed protruding internal hemorrhoid. Marked posterior midline tenderness on DRE. There is a foul odor coming from rectum but no obvious discharge. No fistulas or abscesses seen. Dr. Leone PayorGessner also examined her.  Musculoskeletal: Symmetrical with no gross deformities  Extremities: No edema  Neurological: Alert oriented x 4, grossly  nonfocal Psychological:  Alert and cooperative. Normal mood and affect  Assessment and Recommendations:  33 year old female with rectal pain and bleeding with BMs and maldorous, reddish brown fecal seepage.  On exam there is a protruding internal hemorrhoid. She has marked posterior midline tenderness on DRE so anoscopy not attempted. Given amount of pain she likely has posterior midline fissure. Hold steroid suppositories for now. Will treat with Diltiazem Gel. Continue sitz baths. Return to clinic in 2 weeks (or sooner if needed). If not better will refer to colorectal surgeon.  Avoid constipation. Start daily Citrucel, use Miralax prn.     Addendum:  I received patient's colon[facility] report [facility]. It was done by Dr. Nance PewLenin Peters. Extent of the exam was to the cecum, bowel prep was adequate. A 9 mm polyp was removed at 18 cm. An 8 mm polyp was removed at 10 cm. Internal hemorrhoids were found and banded. Both polyps were hyperplastic. Report will be scanned into Epic

## 2014-03-10 NOTE — Patient Instructions (Addendum)
We [facility] the [facility]: Diltiazem Gel, please apply four times daily   Please avoid Constipation   Continue your Citrucel   You have a follow up visit with Doug SouJessica Zehr, PA on 03-20-2014 at 830 am  Please call back if not feeling better __________________________________________________________________________________________________  Constipation, Adult Constipation is when a person has fewer than 3 bowel movements a week; has difficulty having a bowel movement; or has stools that are dry, hard, or larger than normal. As people grow older, constipation is more common. If you try to fix constipation with medicines that make you have a bowel movement (laxatives), the problem may get worse. Long-term laxative use may cause the muscles of the colon to become weak. A low-fiber diet, not taking in enough fluids, and taking certain medicines may make constipation worse. CAUSES   Certain medicines, such as antidepressants, pain medicine, iron supplements, antacids, and water pills.   Certain diseases, such as diabetes, irritable bowel syndrome (IBS), thyroid disease, or depression.   Not drinking enough water.   Not eating enough fiber-rich foods.   Stress or travel.  Lack of physical activity or exercise.  Not going to the restroom when there is the urge to have a bowel movement.  Ignoring the urge to have a bowel movement.  Using laxatives too much. SYMPTOMS   Having fewer than 3 bowel movements a week.   Straining to have a bowel movement.   Having hard, dry, or larger than normal stools.   Feeling full or bloated.   Pain in the lower abdomen.  Not feeling relief after having a bowel movement. DIAGNOSIS  Your caregiver will take a medical history and perform a physical exam. Further testing may be done for severe constipation. Some tests may include:   A barium enema X-ray to examine your rectum, colon, and sometimes, your  small intestine.  A sigmoidoscopy to examine your lower colon.  A colonoscopy to examine your entire colon. TREATMENT  Treatment will depend on the severity of your constipation and what is causing it. Some dietary treatments include drinking more fluids and eating more fiber-rich foods. Lifestyle treatments may include regular exercise. If these diet and lifestyle recommendations do not help, your caregiver may recommend taking over-the-counter laxative medicines to help you have bowel movements. Prescription medicines may be prescribed if over-the-counter medicines do not work.  HOME CARE INSTRUCTIONS   Increase dietary fiber in your diet, such as fruits, vegetables, whole grains, and beans. Limit high-fat and processed sugars in your diet, such as JamaicaFrench fries, hamburgers, cookies, candies, and soda.   A fiber supplement may be added to your diet if you cannot get enough fiber from foods.   Drink enough fluids to keep your urine clear or pale yellow.   Exercise regularly or as directed by your caregiver.   Go to the restroom when you have the urge to go. Do not hold it.  Only take medicines as directed by your caregiver. Do not take other medicines for constipation without talking to your caregiver first. SEEK IMMEDIATE MEDICAL CARE IF:   You have bright red blood in your stool.   Your constipation lasts for more than 4 days or gets worse.   You have abdominal or rectal pain.   You have thin, pencil-like stools.  You have unexplained weight loss. MAKE SURE YOU:   Understand these instructions.  Will watch your condition.  Will get help right away if you are not doing well  or get worse. Document Released: 08/05/2004 Document Revised: 01/30/2012 Document Reviewed: 08/19/2013 [facility] Patient Information 2014 [employer] [place].

## 2014-03-11 NOTE — Progress Notes (Signed)
Agree with Ms. Guenther's assessment and plan. Carl E. Gessner, MD, FACG   

## 2014-03-12 ENCOUNTER — Telehealth: Payer: Self-pay | Admitting: *Deleted

## 2014-03-12 ENCOUNTER — Telehealth: Payer: Self-pay | Admitting: Nurse Practitioner

## 2014-03-12 MED ORDER — LIDOCAINE HCL 2 % EX GEL: 1.0000 "application " | Freq: Three times a day (TID) | CUTANEOUS | Status: DC | PRN

## 2014-03-12 MED ORDER — TRAMADOL HCL 50 MG PO TABS: 50.0000 mg | ORAL_TABLET | Freq: Four times a day (QID) | ORAL | Status: DC | PRN

## 2014-03-12 NOTE — Telephone Encounter (Signed)
Left a message for patient to call me. 

## 2014-03-12 NOTE — Telephone Encounter (Signed)
Lauren PelPaula M Guenther, NP at 03/12/2014 12:46 PM     Status: Signed        Lauren [name], please let Lauren [name] know that the diltiazem gel make take a few weeks to totally heal what we think is a fissure. Cannot remember if we prescribed lidocaine gel but if not then we can try that TID prn pain. If already using lidocaine then lets add Ultram 50mg  one Q6 hours prn pain. If still having horrible pain then we can go ahead and refer her to Dr. Maisie Fushomas at [facility]. Thanks  Lauren [name]      I called patient back and I advised that I am sending lidocaine gel to her pharmacy for her to use three times daily as needed for pain. I advised we also sent in Tramadol 50 mg for her to take every six hours as needed for pain. I advised patient to call back mid next week and give Lauren Lauren Miller, Lauren [name] an update on how she is feeling--Lauren [name] Fish farm managerGuenther's nurse. I advised patient if she is not completely better we will refer her to [facility]. Patient verbalized understanding.

## 2014-03-12 NOTE — Telephone Encounter (Signed)
lmom for patient to call back 

## 2014-03-12 NOTE — Telephone Encounter (Signed)
Tresa EndoKelly, please let [name] know that the diltiazem gel make take a few weeks to totally heal what we think is a fissure. Cannot remember if we prescribed lidocaine gel but if not then we can try that TID prn pain. If already using lidocaine then lets add Ultram 50mg  one Q6 hours prn pain. If still having horrible pain then we can go ahead and refer her to Dr. Maisie Fushomas at [facility]. Thanks Lex[employer] InternationalPaula

## 2014-03-12 NOTE — Telephone Encounter (Signed)
I was actually going to call today because I feel worse. I had a hard bowel movement Mon. PM, even though taking suppository softeners. I think it made it worse. When I put the gel on, it hurts me so bad and I don't feel better. I went walking and that might of made it worse. I woke me up last night, in pain. Does it take a while for the gel to work? Should I give it a little longer to work? Ty ----- Message -----   From: Willette ClusterPaula Guenther, NP Sent: 03/12/2014 12:00 AM EDT To: Lauren BaasMandy E [name] Subject: Questionnaire To ensure we are providing you the highest quality healthcare, we'd like to know how you are feeling after your recent visit. At your earliest convenience, please complete the brief follow-up assessment by clicking the Task: Questionnaire link listed above. Thank you for your time in helping us improve our services and for partnering with us in your wellness and care. Sincerely, Your Care Team

## 2014-03-12 NOTE — Telephone Encounter (Signed)
Ok AnisKelly Smith, CMA spoke with patient.

## 2014-03-13 ENCOUNTER — Telehealth: Payer: Self-pay | Admitting: Internal Medicine

## 2014-03-13 ENCOUNTER — Telehealth: Payer: Self-pay | Admitting: Nurse Practitioner

## 2014-03-13 DIAGNOSIS — K602 Anal fissure, unspecified: Secondary | ICD-10-CM

## 2014-03-13 MED ORDER — HYDROCODONE-ACETAMINOPHEN 5-325 MG PO TABS: ORAL_TABLET | ORAL | Status: DC

## 2014-03-13 NOTE — Telephone Encounter (Signed)
See phone note from this am.  Patient should see Dr. Andrey CampanileWilson with [facility].  I have cancelled the appt with Doug SouJessica Zehr, PA .  Patient notified

## 2014-03-13 NOTE — Telephone Encounter (Signed)
Patient states she has tried the Lidocaine and Ultram. Still rates pain at "8". Called [facility] and scheduled OV with Dr. Andrey CampanileWilson on 03/25/14 at 2:45 PM. Per Willette ClusterPaula Guenther, NP Hydrocodone 1/2 -1 tablet every 6 hours prn. No driving or drinking. D/C Tramadol. Patient aware. Rx up front for pick up.

## 2014-03-20 ENCOUNTER — Telehealth: Payer: Self-pay | Admitting: Nurse Practitioner

## 2014-03-20 ENCOUNTER — Ambulatory Visit: Payer: BC Managed Care – PPO | Admitting: Gastroenterology

## 2014-03-20 NOTE — Telephone Encounter (Signed)
Pt aware. Pt scheduled to see Doug SouJessica Zehr PA tomorrow at 2:30pm.

## 2014-03-20 NOTE — Telephone Encounter (Signed)
Left message for pt to call back  °

## 2014-03-20 NOTE — Telephone Encounter (Signed)
Pyrtle pt seen by Willette ClusterPaula Guenther NP for anal fissure,  was seen and given diltiazem gel. Pt states that yesterday she noticed that he rectal area is irritated and red. States that it looks and feels like there are blisters around her rectum and she states that is feels "raw" and there is some itching. Dr. Russella DarStark as doc of the day please advise.

## 2014-03-20 NOTE — Telephone Encounter (Signed)
Dr. Lauro FranklinPyrtle's patient. DC diltiazem and any other local anal/rectal medications. REV with APP or Dr. Rhea BeltonPyrtle to further evaluate.

## 2014-03-21 ENCOUNTER — Ambulatory Visit (INDEPENDENT_AMBULATORY_CARE_PROVIDER_SITE_OTHER): Payer: BC Managed Care – PPO | Admitting: Gastroenterology

## 2014-03-21 ENCOUNTER — Encounter: Payer: Self-pay | Admitting: Gastroenterology

## 2014-03-21 VITALS — BP 110/60 | HR 66 | Wt 152.6 lb

## 2014-03-21 DIAGNOSIS — K602 Anal fissure, unspecified: Secondary | ICD-10-CM

## 2014-03-21 DIAGNOSIS — R21 Rash and other nonspecific skin eruption: Secondary | ICD-10-CM

## 2014-03-21 NOTE — Progress Notes (Addendum)
03/21/2014 Lowella PettiesMandy E Ruffino [id number] 03/20/1981   History of Present Illness:  This is a 33 year old female who was seen last week by our NP, Gunnar FusiPaula.  She was started on Diltiazem gel for an anal fissure.  She has also been referred to [facility] for evaluation.  She thought that she was getting better with the diltiazem gel, but then on Wednesday she started having per-anal discomfort and says that it looked like there was a blistery rash around her anus.  She called here and was told to discontinue the diltiazem gel and this appointment was made for her to be seen today.  She says that the rash has already been getting better since Wednesday.   Current Medications, Allergies, Past Medical History, Past Surgical History, Family History and Social History [facility].   Physical Exam: BP 110/60  Pulse 66  Wt 152 lb 9.6 oz (69.219 kg)  LMP 02/07/2014 General: Well developed white female in no acute distress Head: Normocephalic and atraumatic Eyes:  Sclerae anicteric, conjunctiva pink  Ears:  Normal auditory acuity Rectal:  Large inflamed protruding internal hemorrhoid noted.  Very mild perianal rash noted. Musculoskeletal: Symmetrical with no gross deformities  Extremities: No edema  Neurological: Alert oriented x 4, grossly non-focal Psychological:  Alert and cooperative. Normal mood and affect  Assessment and Recommendations: -Anal fissure:  Has an appt at [facility] next week. -Peri-anal rash:  ? Contact dermatitis from the diltiazem.  Will continue to hold the diltiazem.  Can use Desitin if desired for now.  Addendum: Reviewed and agree with initial management. Beverley FiedlerJay M Pyrtle, MD

## 2014-03-21 NOTE — Patient Instructions (Signed)
Continue not using Diltizem Gel  Please purchase [place] or A&D Ointment to use on your peri anal area   [facility]

## 2014-03-22 ENCOUNTER — Other Ambulatory Visit: Payer: Self-pay | Admitting: Emergency Medicine

## 2014-03-25 ENCOUNTER — Encounter (INDEPENDENT_AMBULATORY_CARE_PROVIDER_SITE_OTHER): Payer: Self-pay | Admitting: General Surgery

## 2014-03-25 ENCOUNTER — Ambulatory Visit (INDEPENDENT_AMBULATORY_CARE_PROVIDER_SITE_OTHER): Payer: BC Managed Care – PPO | Admitting: General Surgery

## 2014-03-25 VITALS — BP 110/70 | HR 75 | Temp 98.1°F | Resp 14 | Ht 61.0 in | Wt 152.4 lb

## 2014-03-25 DIAGNOSIS — K602 Anal fissure, unspecified: Secondary | ICD-10-CM

## 2014-03-25 MED ORDER — AMBULATORY NON FORMULARY MEDICATION: 1.0000 "application " | Freq: Two times a day (BID) | Status: DC

## 2014-03-25 NOTE — Progress Notes (Signed)
Patient ID: Lauren PettiesMandy E [name], female   DOB: 05/31/1981, 33 y.o.   MRN: [id number]  Chief Complaint  Patient presents with  . Anal Fissure    HPI Lauren [name] is a 33 y.o. female.   HPI -year-old Caucasian female referred by Dr. Rhea BeltonPyrtle for evaluation of an anal fissure.The patient states her symptoms started about one month ago. She states that when she had a bowel movement she would have severe excruciating pain. She describes it as passing shards of glass. She states that she would have bleeding in the commode,toilet Paper. The discomfort would last for some time. She also had difficulty sitting and standing. She saw her primary care physician's office and was referred to gastroenterology. She was started on diltiazem ointment and lidocaine ointment. She states the lidocaine ointment burned. She states that she developed some blisters around her anus and stop using the diltiazem. The blisters have resolved. She has been using MiraLAX daily. She states that the Vicodin will help with the discomfort. She states that she is slowly improving. She is no longer having pain and discomfort while standing or sitting. She just has pain when she has a bowel movement. Right now is about a 5/10 when having a bowel movement. Initially several weeks ago when her symptoms started she would rate her discomfort as a 10 out of 10. Normally she has a bowel movement about every 3 days. She sits on the commode for about 5 minutes at a time. She drinks about 8 glasses of water a day. She reports about 3 months ago she had problems with low thyroid which cause some constipation but her thyroid level is now normal. Currently she is not applying any ointment around her anus. She had a colonoscopy a few years ago which showed benign polyps per the patient. She denies any fever, chills, weight loss or abdominal pain. She also states that she has a skin tag as well Past Medical History  Diagnosis Date  . Depression   .  Hypothyroidism   . HSV-1 infection   . Fibromyalgia   . Preeclampsia   . Hypertension   . Anxiety   . Hemorrhoids   . Constipation   . GERD (gastroesophageal reflux disease)   . Other abnormal glucose   . Anemia     Past Surgical History  Procedure Laterality Date  . Cholecystectomy  2006  . Tonsillectomy    . Wisdom tooth extraction      Family History  Problem Relation Age of Onset  . Heart disease Maternal Grandmother   . Diabetes Maternal Grandmother   . Cancer Maternal Grandmother     SKIN  . Hyperlipidemia Maternal Grandmother   . Hypertension Maternal Grandmother   . Hypothyroidism Mother   . Cervical cancer Mother   . Bipolar disorder Mother   . Diabetes Mother   . Colon polyps Mother   . Bipolar disorder Brother   . Kidney disease Brother   . Alcohol abuse Brother   . Lung cancer Maternal Grandfather   . Heart disease Maternal Aunt   . Diabetes Maternal Aunt   . Diabetes Cousin   . Colon cancer Neg Hx     Social History History  Substance Use Topics  . Smoking status: Never Smoker   . Smokeless tobacco: Never Used  . Alcohol Use: Yes     Comment: occ    Allergies  Allergen Reactions  . Doxycycline Other (See Comments)    GI UPSET  . Effexor [  Venlafaxine] Other (See Comments)    DYSP         DYSPHORIA  . Prednisone Other (See Comments)    FLUSHED  . Savella [Milnacipran Hcl] Other (See Comments)    NO RELIEF    Current Outpatient Prescriptions  Medication Sig Dispense Refill  . Cholecalciferol (VITAMIN D) 2000 UNITS CAPS Take 8,000 Units by mouth daily.       . cyclobenzaprine (FLEXERIL) 10 MG tablet       . FLUoxetine (PROZAC) 40 MG capsule Take 40 mg by mouth daily.      . fluticasone (FLONASE) 50 MCG/ACT nasal spray Place 2 sprays into both nostrils daily.  16 g  6  . HYDROcodone-acetaminophen (NORCO/VICODIN) 5-325 MG per tablet Take 1/2 -1 tablet every 6 hours prn.  30 tablet  0  . ibuprofen (ADVIL,MOTRIN) 200 MG tablet Take  200 mg by mouth every 6 (six) hours as needed for pain. Takes 3 BID      . levothyroxine (SYNTHROID, LEVOTHROID) 100 MCG tablet Take 100 mcg by mouth daily. Takes 1 tablet T,Thurs,Sat, Sun and 1/2 = 50 mcg M,W,F      . magnesium 30 MG tablet Take 250 mg by mouth 1 day or 1 dose.      . metroNIDAZOLE (METROGEL VAGINAL) 0.75 % vaginal gel Place 1 Applicatorful vaginally 1 day or 1 dose.  70 g  2  . polyethylene glycol (MIRALAX / GLYCOLAX) packet Take 17 g by mouth daily.      . pravastatin (PRAVACHOL) 40 MG tablet Take 40 mg by mouth daily.       Marland Kitchen SYNTHROID 100 MCG tablet TAKE 1 TABLET (100 MCG TOTAL) BY MOUTH DAILY. TAKE 1 & 1/2 TABLETS ON MON,WEDS, & FRI  30 tablet  3  . AMBULATORY NON FORMULARY MEDICATION Apply 1 application topically 2 (two) times daily. Medication Name: nifedipine 2% ointment Apply liberal amount around anus twice a day for 6 weeks  30 g  1   No current facility-administered medications for this visit.    Review of Systems Review of Systems  Constitutional: Negative for fever, activity change, appetite change and unexpected weight change.  HENT: Negative for nosebleeds and trouble swallowing.   Eyes: Negative for photophobia and visual disturbance.  Respiratory: Negative for chest tightness and shortness of breath.   Cardiovascular: Negative for chest pain and leg swelling.       Denies CP, SOB, orthopnea, PND, DOE  Genitourinary: Negative for dysuria and difficulty urinating.  Musculoskeletal: Negative for arthralgias.       +fibromyalgia  Skin: Negative for pallor and rash.  Neurological: Negative for dizziness, seizures, facial asymmetry and numbness.       Denies TIA and amaurosis fugax   Hematological: Negative for adenopathy. Does not bruise/bleed easily.  Psychiatric/Behavioral: Negative for behavioral problems and agitation.    Blood pressure 110/70, pulse 75, temperature 98.1 F (36.7 C), resp. rate 14, height 5\' 1"  (1.549 m), weight 152 lb 6.4 oz  (69.128 kg), last menstrual period 02/07/2014.  Physical Exam Physical Exam  Constitutional: She is oriented to person, place, and time. She appears well-developed and well-nourished. No distress.  HENT:  Head: Normocephalic and atraumatic.  Right Ear: External ear normal.  Left Ear: External ear normal.  Eyes: Conjunctivae are normal. No scleral icterus.  Neck: Normal range of motion. Neck supple. No tracheal deviation present. No thyromegaly present.  Cardiovascular: Normal rate, normal heart sounds and intact distal pulses.   Pulmonary/Chest: Effort normal and  breath sounds normal. No respiratory distress. She has no wheezes.  Abdominal: Soft. She exhibits no distension. There is no tenderness. There is no rebound and no guarding.    Genitourinary: Rectal exam shows fissure.     Visual inspection only given presence of post midline anal fissure; has pedunculated skin tag near post midline - not c/w sentinel pile or prolapsed int hem  Musculoskeletal: Normal range of motion. She exhibits no edema and no tenderness.  Lymphadenopathy:    She has no cervical adenopathy.  Neurological: She is alert and oriented to person, place, and time. She exhibits normal muscle tone.  Skin: Skin is warm and dry. No rash noted. She is not diaphoretic. No erythema. No pallor.  Psychiatric: She has a normal mood and affect. Her behavior is normal. Judgment and thought content normal.    Data Reviewed Willette Cluster notes J. Zehr notes  Assessment    Anal fissure Rectal skin tag     Plan    We discussed the etiology of anal fissures. The patient was given educational material as well as diagrams. We discussed nonoperative and operative management of anal fissures.  We discussed nonoperative management including correcting underlying bowel habits such as constipation, avoiding bathroom reading, avoiding straining with defecation. We also discussed the use of topical ointments such as  nifedipine ointment. We also discussed the use of Botox injection.  With respect to surgical intervention, we discussed an anal sphincterotomy. I described how the procedure is performed. We also discussed the aftercare. We discussed the risk and benefits of surgery including but not limited to bleeding, infection, blood clot formation, general anesthesia risk, urinary retention, and the risk of incontinence. We discussed a 20-25% chance of incontinence to flatus, a 10-20% chance of incontinence to liquid stool, and a 5-10% chance of incontinence to solid stool. I explained that the percentages I quoted are from the literature and not from my personal practice experience.  My recommendation was to start with non-operative management first.  The patient has elected to Start with nonoperative management. She will Gradually adopt a high fiber diet. We also discussed the importance of fiber supplementation like Benefiber or Metamucil. In the interim I instructed her to continue with MiraLAX daily. I also encouraged her to continue drinking plenty water on a daily basis. We discussed the importance of starting with low-dose fiber supplementation and slowly increasing it in order to avoid bloating and cramping. She was given a prescription for nifedipine ointment twice a day. I did discuss the possibility of also having a reaction to it as well but I think it's worth a shot. We also discussed the ongoing importance of sitz baths while her fissure is healing. We discussed the importance of avoiding application of hydrocortisone ointment to the area. I also advised her not to apply lidocaine ointment to the area. All of her questions were asked and answered. With respect to the skin tag my recommendation was to leave it alone for now since it is in the vicinity of the anal fissure.  She was instructed on what to call for. Right now since she is slowly getting better I will let her followup be as needed. She was  instructed to call me if she has ongoing symptoms. Once her fissure is healed I think we can simply excise the skin tag in the office if she chooses to proceed with that  I spent a total of 45 minutes with the patient more than 50% was spent in  counseling  Mary Sellaric M. Andrey CampanileWilson, MD, FACS General, Bariatric, & Minimally Invasive Surgery [facility]          Atilano Inaric M Zamia Tyminski 03/25/2014, 4:05 PM

## 2014-03-25 NOTE — Patient Instructions (Signed)
GETTING TO GOOD BOWEL HEALTH. Irregular bowel habits such as constipation and diarrhea can lead to many problems over time.  Having one soft bowel movement a day is the most important way to prevent further problems.  The anorectal canal is designed to handle stretching and feces to safely manage our ability to get rid of solid waste (feces, poop, stool) out of our body.  BUT, hard constipated stools can act like ripping concrete bricks and diarrhea can be a burning fire to this very sensitive area of our body, causing inflamed hemorrhoids, anal fissures, increasing risk is perirectal abscesses, abdominal pain/bloating, an making irritable bowel worse.     The goal: ONE SOFT BOWEL MOVEMENT A DAY!  To have soft, regular bowel movements:    Drink at least 8 tall glasses of water a day.     Take plenty of fiber.  Fiber is the undigested part of plant food that passes into the colon, acting s "natures broom" to encourage bowel motility and movement.  Fiber can absorb and hold large amounts of water. This results in a larger, bulkier stool, which is soft and easier to pass. Work gradually over several weeks up to 6 servings a day of fiber (25g a day even more if needed) in the form of: o Vegetables -- Root (potatoes, carrots, turnips), leafy green (lettuce, salad greens, celery, spinach), or cooked high residue (cabbage, broccoli, etc) o Fruit -- Fresh (unpeeled skin & pulp), Dried (prunes, apricots, cherries, etc ),  or stewed ( applesauce)  o Whole grain breads, pasta, etc (whole wheat)  o Bran cereals    Bulking Agents -- This type of water-retaining fiber generally is easily obtained each day by one of the following:  o Psyllium bran -- The psyllium plant is remarkable because its ground seeds can retain so much water. This product is available as Metamucil, Konsyl, Effersyllium, Per Diem Fiber, or the less expensive generic preparation in drug and health food stores. Although labeled a laxative, it really  is not a laxative.  o Methylcellulose -- This is another fiber derived from wood which also retains water. It is available as Citrucel. o Benefiber o Polyethylene Glycol - and "artificial" fiber commonly called Miralax or Glycolax.  It is helpful for people with gassy or bloated feelings with regular fiber o Flax Seed - a less gassy fiber than psyllium   No reading or other relaxing activity while on the toilet. If bowel movements take longer than 5 minutes, you are too constipated   AVOID CONSTIPATION.  High fiber and water intake usually takes care of this.  Sometimes a laxative is needed to stimulate more frequent bowel movements, but    Laxatives are not a good long-term solution as it can wear the colon out. o Osmotics (Milk of Magnesia, Fleets phosphosoda, Magnesium citrate, MiraLax, GoLytely) are safer than  o Stimulants (Senokot, Castor Oil, Dulcolax, Ex Lax)    o Do not take laxatives for more than 7days in a row.    IF SEVERELY CONSTIPATED, try a Bowel Retraining Program: o Do not use laxatives.  o Eat a diet high in roughage, such as bran cereals and leafy vegetables.  o Drink six (6) ounces of prune or apricot juice each morning.  o Eat two (2) large servings of stewed fruit each day.  o Take one (1) heaping tablespoon of a psyllium-based bulking agent twice a day. Use sugar-free sweetener when possible to avoid excessive calories.  o Eat a normal breakfast.  o Set aside 15 minutes after breakfast to sit on the toilet, but do not strain to have a bowel movement.  o If you do not have a bowel movement by the third day, use an enema and repeat the above steps.   Anal Fissure, Adult An anal fissure is a small tear or crack in the skin around the anus. Bleeding from a fissure usually stops on its own within a few minutes. However, bleeding will often reoccur with each bowel movement until the crack heals.  CAUSES   Passing large, hard stools.  Frequent diarrheal  stools.  Constipation.  Inflammatory bowel disease (Crohn's disease or ulcerative colitis).  Infections.  Anal sex. SYMPTOMS   Small amounts of blood seen on your stools, on toilet paper, or in the toilet after a bowel movement.  Rectal bleeding.  Painful bowel movements.  Itching or irritation around the anus. DIAGNOSIS Your caregiver will examine the anal area. An anal fissure can usually be seen with careful inspection. A rectal exam may be performed and a short tube (anoscope) may be used to examine the anal canal. TREATMENT   You may be instructed to take fiber supplements. These supplements can soften your stool to help make bowel movements easier.  Sitz baths may be recommended to help heal the tear. Do not use soap in the sitz baths.  A medicated cream or ointment may be prescribed to lessen discomfort. HOME CARE INSTRUCTIONS   Maintain a diet high in fruits, whole grains, and vegetables. Avoid constipating foods like bananas and dairy products.  Take sitz baths as directed by your caregiver.  Drink enough fluids to keep your urine clear or pale yellow.  Only take over-the-counter or prescription medicines for pain, discomfort, or fever as directed by your caregiver. Do not take aspirin as this may increase bleeding.  Do not use ointments containing numbing medications (anesthetics) or hydrocortisone. They could slow healing. SEEK MEDICAL CARE IF:   Your fissure is not completely healed within 3 days.  You have further bleeding.  You have a fever.  You have diarrhea mixed with blood.  You have pain.  Your problem is getting worse rather than better. MAKE SURE YOU:   Understand these instructions.  Will watch your condition.  Will get help right away if you are not doing well or get worse. Document Released: 11/07/2005 Document Revised: 01/30/2012 Document Reviewed: 04/24/2011 [facility] Patient Information 2014 [employer], [place].

## 2014-04-24 ENCOUNTER — Ambulatory Visit: Payer: BC Managed Care – PPO | Admitting: Internal Medicine

## 2014-05-08 ENCOUNTER — Other Ambulatory Visit: Payer: Self-pay | Admitting: Emergency Medicine

## 2014-05-08 MED ORDER — ALPRAZOLAM 1 MG PO TABS: 1.0000 mg | ORAL_TABLET | Freq: Two times a day (BID) | ORAL | Status: DC | PRN

## 2014-05-27 ENCOUNTER — Telehealth: Payer: Self-pay | Admitting: Obstetrics & Gynecology

## 2014-05-27 ENCOUNTER — Other Ambulatory Visit: Payer: Self-pay

## 2014-05-27 MED ORDER — FLUOXETINE HCL 40 MG PO CAPS: 40.0000 mg | ORAL_CAPSULE | Freq: Every day | ORAL | Status: DC

## 2014-05-27 NOTE — Telephone Encounter (Addendum)
Pt states that she is extremely tired and she thinks she has a UTI or BV. Pt wants Drenda FreezeFran to call in Metrogel. I advised that she may have to come in since she has not been seen since September of last year. I advised the pt that I would send the message to Drenda FreezeFran and see what she says.   10:30 AM I sent a rx for metrogel, but if no improvement, please schedule appt with me or PCP . CRESENZO-DISHMAN,FRANCES

## 2014-05-28 MED ORDER — METRONIDAZOLE 0.75 % VA GEL: 1.0000 | Freq: Every day | VAGINAL | Status: DC

## 2014-05-28 NOTE — Telephone Encounter (Signed)
Pt aware that Rx was sent to her pharmacy and that if symptoms do not get better to see us or her PCP. Pt verbalized understanding.

## 2014-07-03 ENCOUNTER — Encounter: Payer: Self-pay | Admitting: Physician Assistant

## 2014-07-03 ENCOUNTER — Ambulatory Visit (INDEPENDENT_AMBULATORY_CARE_PROVIDER_SITE_OTHER): Payer: BC Managed Care – PPO | Admitting: Physician Assistant

## 2014-07-03 VITALS — BP 110/72 | HR 72 | Temp 98.4°F | Resp 16 | Ht 61.0 in | Wt 149.0 lb

## 2014-07-03 DIAGNOSIS — F32A Depression, unspecified: Secondary | ICD-10-CM

## 2014-07-03 DIAGNOSIS — F329 Major depressive disorder, single episode, unspecified: Secondary | ICD-10-CM

## 2014-07-03 DIAGNOSIS — I10 Essential (primary) hypertension: Secondary | ICD-10-CM

## 2014-07-03 DIAGNOSIS — E039 Hypothyroidism, unspecified: Secondary | ICD-10-CM

## 2014-07-03 DIAGNOSIS — Z79899 Other long term (current) drug therapy: Secondary | ICD-10-CM

## 2014-07-03 DIAGNOSIS — E785 Hyperlipidemia, unspecified: Secondary | ICD-10-CM

## 2014-07-03 DIAGNOSIS — F3289 Other specified depressive episodes: Secondary | ICD-10-CM

## 2014-07-03 DIAGNOSIS — E559 Vitamin D deficiency, unspecified: Secondary | ICD-10-CM

## 2014-07-03 DIAGNOSIS — R7309 Other abnormal glucose: Secondary | ICD-10-CM

## 2014-07-03 LAB — CBC WITH DIFFERENTIAL/PLATELET
Basophils Absolute: 0 10*3/uL (ref 0.0–0.1)
Basophils Relative: 0 % (ref 0–1)
EOS ABS: 0.1 10*3/uL (ref 0.0–0.7)
EOS PCT: 1 % (ref 0–5)
HEMATOCRIT: 42.5 % (ref 36.0–46.0)
Hemoglobin: 14.1 g/dL (ref 12.0–15.0)
LYMPHS ABS: 2.1 10*3/uL (ref 0.7–4.0)
Lymphocytes Relative: 37 % (ref 12–46)
MCH: 29 pg (ref 26.0–34.0)
MCHC: 33.2 g/dL (ref 30.0–36.0)
MCV: 87.3 fL (ref 78.0–100.0)
MONOS PCT: 7 % (ref 3–12)
Monocytes Absolute: 0.4 10*3/uL (ref 0.1–1.0)
Neutro Abs: 3.1 10*3/uL (ref 1.7–7.7)
Neutrophils Relative %: 55 % (ref 43–77)
Platelets: 167 10*3/uL (ref 150–400)
RBC: 4.87 MIL/uL (ref 3.87–5.11)
RDW: 13.7 % (ref 11.5–15.5)
WBC: 5.7 10*3/uL (ref 4.0–10.5)

## 2014-07-03 MED ORDER — BACLOFEN 10 MG PO TABS: 10.0000 mg | ORAL_TABLET | Freq: Two times a day (BID) | ORAL | Status: DC

## 2014-07-03 NOTE — Patient Instructions (Signed)
Melatonin can do up to 15mg  at night  Insomnia Insomnia is frequent trouble falling and/or staying asleep. Insomnia can be a long term problem or a short term problem. Both are common. Insomnia can be a short term problem when the wakefulness is related to a certain stress or worry. Long term insomnia is often related to ongoing stress during waking hours and/or poor sleeping habits. Overtime, sleep deprivation itself can make the problem worse. Every little thing feels more severe because you are overtired and your ability to cope is decreased. CAUSES   Stress, anxiety, and depression.  Poor sleeping habits.  Distractions such as TV in the bedroom.  Naps close to bedtime.  Engaging in emotionally charged conversations before bed.  Technical reading before sleep.  Alcohol and other sedatives. They may make the problem worse. They can hurt normal sleep patterns and normal dream activity.  Stimulants such as caffeine for several hours prior to bedtime.  Pain syndromes and shortness of breath can cause insomnia.  Exercise late at night.  Changing time zones may cause sleeping problems (jet lag). It is sometimes helpful to have someone observe your sleeping patterns. They should look for periods of not breathing during the night (sleep apnea). They should also look to see how long those periods last. If you live alone or observers are uncertain, you can also be observed at a sleep clinic where your sleep patterns will be professionally monitored. Sleep apnea requires a checkup and treatment. Give your caregivers your medical history. Give your caregivers observations your family has made about your sleep.  SYMPTOMS   Not feeling rested in the morning.  Anxiety and restlessness at bedtime.  Difficulty falling and staying asleep. TREATMENT   Your caregiver may prescribe treatment for an underlying medical disorders. Your caregiver can give advice or help if you are using alcohol or  other drugs for self-medication. Treatment of underlying problems will usually eliminate insomnia problems.  Medications can be prescribed for short time use. They are generally not recommended for lengthy use.  Over-the-counter sleep medicines are not recommended for lengthy use. They can be habit forming.  You can promote easier sleeping by making lifestyle changes such as:  Using relaxation techniques that help with breathing and reduce muscle tension.  Exercising earlier in the day.  Changing your diet and the time of your last meal. No night time snacks.  Establish a regular time to go to bed.  Counseling can help with stressful problems and worry.  Soothing music and white noise may be helpful if there are background noises you cannot remove.  Stop tedious detailed work at least one hour before bedtime. HOME CARE INSTRUCTIONS   Keep a diary. Inform your caregiver about your progress. This includes any medication side effects. See your caregiver regularly. Take note of:  Times when you are asleep.  Times when you are awake during the night.  The quality of your sleep.  How you feel the next day. This information will help your caregiver care for you.  Get out of bed if you are still awake after 15 minutes. Read or do some quiet activity. Keep the lights down. Wait until you feel sleepy and go back to bed.  Keep regular sleeping and waking hours. Avoid naps.  Exercise regularly.  Avoid distractions at bedtime. Distractions include watching television or engaging in any intense or detailed activity like attempting to balance the household checkbook.  Develop a bedtime ritual. Keep a familiar routine of bathing,  brushing your teeth, climbing into bed at the same time each night, listening to soothing music. Routines increase the success of falling to sleep faster.  Use relaxation techniques. This can be using breathing and muscle tension release routines. It can also  include visualizing peaceful scenes. You can also help control troubling or intruding thoughts by keeping your mind occupied with boring or repetitive thoughts like the old concept of counting sheep. You can make it more creative like imagining planting one beautiful flower after another in your backyard garden.  During your day, work to eliminate stress. When this is not possible use some of the previous suggestions to help reduce the anxiety that accompanies stressful situations. MAKE SURE YOU:   Understand these instructions.  Will watch your condition.  Will get help right away if you are not doing well or get worse. Document Released: 11/04/2000 Document Revised: 01/30/2012 Document Reviewed: 12/05/2007 [facility] Patient Information 2015 [place], [place]. This information is not intended to replace advice given to you by your health care provider. Make sure you discuss any questions you have with your health care provider.

## 2014-07-03 NOTE — Progress Notes (Addendum)
Assessment and Plan:  Hypertension: Continue medication, monitor blood pressure at home. Continue DASH diet. Cholesterol: Continue diet and exercise. Check cholesterol.  Pre-diabetes-Continue diet and exercise. Check A1C Vitamin D Def- check level and continue medications.  Insomnia: hygeine discussed, try melatonin and take xanax as needed, occ TMJ and flexeril helps but makes tired in AM, can try baclofen instead Left hand pain-? OA ver tendonitis- RICE, can use brace at night  Continue diet and meds as discussed. Further disposition pending results of labs.  HPI 33 y.o. female  presents for 3 month follow up with hypertension, hyperlipidemia, prediabetes and vitamin D. Her blood pressure has been controlled at home, today their BP is BP: 110/72 mmHg She does workout, has been walking daily until the last week. She denies chest pain, shortness of breath, dizziness.  She is on cholesterol medication and denies myalgias. Her cholesterol is at goal. The cholesterol last visit was:   Lab Results  Component Value Date   CHOL 173 02/20/2014   HDL 49 02/20/2014   LDLCALC 98 02/20/2014   LDLDIRECT 114.9 04/30/2012   TRIG 128 02/20/2014   CHOLHDL 3.5 02/20/2014   Last A1C in the office was:  Lab Results  Component Value Date   HGBA1C 5.3 02/20/2014   Patient is on Vitamin D supplement, it was decreased to 4000 units last visit.   Lab Results  Component Value Date   VD25OH >120* 02/20/2014     She is on thyroid medication. Her medication was not changed last visit. Patient denies nervousness, palpitations and weight changes.  Lab Results  Component Value Date   TSH 1.512 02/20/2014  .  She has been seen by GI for an anal fissure and is on miralax daily for constipation, she states it is much better.  In her left hand for the last 3 weeks in her 3rd and 4th fingers, achy and worse with a fist at the MCP/PIP. She is right handed. Denies arm pain, wrist, pain, numbness, tingling, decreased grip,  weakness.  She is going back to school and is concerned about her sleep, she does not want to take the xanax every night.  Current Medications:  Current Outpatient Prescriptions on File Prior to Visit  Medication Sig Dispense Refill  . ALPRAZolam (XANAX) 1 MG tablet Take 1 tablet (1 mg total) by mouth 2 (two) times daily as needed for sleep.  60 tablet  1  . AMBULATORY NON FORMULARY MEDICATION Apply 1 application topically 2 (two) times daily. Medication Name: nifedipine 2% ointment Apply liberal amount around anus twice a day for 6 weeks  30 g  1  . Cholecalciferol (VITAMIN D) 2000 UNITS CAPS Take 8,000 Units by mouth daily.       . cyclobenzaprine (FLEXERIL) 10 MG tablet       . FLUoxetine (PROZAC) 40 MG capsule Take 1 capsule (40 mg total) by mouth daily.  30 capsule  1  . ibuprofen (ADVIL,MOTRIN) 200 MG tablet Take 200 mg by mouth every 6 (six) hours as needed for pain. Takes 3 BID      . levothyroxine (SYNTHROID, LEVOTHROID) 100 MCG tablet Take 100 mcg by mouth daily.       . magnesium 30 MG tablet Take 250 mg by mouth 1 day or 1 dose.      . metroNIDAZOLE (METROGEL VAGINAL) 0.75 % vaginal gel Place 1 Applicatorful vaginally at bedtime. For 5 days  70 g  2  . polyethylene glycol (MIRALAX / GLYCOLAX) packet Take  17 g by mouth daily.      . pravastatin (PRAVACHOL) 40 MG tablet Take 40 mg by mouth daily.        No current facility-administered medications on file prior to visit.   Medical History:  Past Medical History  Diagnosis Date  . Depression   . Hypothyroidism   . HSV-1 infection   . Fibromyalgia   . Preeclampsia   . Hypertension   . Anxiety   . Hemorrhoids   . Constipation   . GERD (gastroesophageal reflux disease)   . Other abnormal glucose   . Anemia    Allergies:  Allergies  Allergen Reactions  . Doxycycline Other (See Comments)    GI UPSET  . Effexor [Venlafaxine] Other (See Comments)    DYSP         DYSPHORIA  . Prednisone Other (See Comments)     FLUSHED  . Savella [Milnacipran Hcl] Other (See Comments)    NO RELIEF     Review of Systems: [X]  = complains of  [ ]  = denies  General: Fatigue [ ]  Fever [ ]  Chills [ ]  Weakness [ ]   Insomnia [ ]  Eyes: Redness [ ]  Blurred vision [ ]  Diplopia [ ]   ENT: Congestion [ ]  Sinus Pain [ ]  Post Nasal Drip [ ]  Sore Throat [ ]  Earache [ ]   Cardiac: Chest pain/pressure [ ]  SOB [ ]  Orthopnea [ ]   Palpitations [ ]   Paroxysmal nocturnal dyspnea[ ]  Claudication [ ]  Edema [ ]   Pulmonary: Cough [ ]  Wheezing[ ]   SOB [ ]   Snoring [ ]   GI: Nausea [ ]  Vomiting[ ]  Dysphagia[ ]  Heartburn[ ]  Abdominal pain [ ]  Constipation [ ] ; Diarrhea [ ] ; BRBPR [ ]  Melena[ ]  GU: Hematuria[ ]  Dysuria [ ]  Nocturia[ ]  Urgency [ ]   Hesitancy [ ]  Discharge [ ]  Neuro: Headaches[ ]  Vertigo[ ]  Paresthesias[ ]  Spasm [ ]  Speech changes [ ]  Incoordination [ ]   Ortho: Arthritis [ ]  Joint pain [ ]  Muscle pain [ ]  Joint swelling [ ]  Back Pain [ ]  Skin:  Rash [ ]   Pruritis [ ]  Change in skin lesion [ ]   Psych: Depression[ ]  Anxiety[ ]  Confusion [ ]  Memory loss [ ]   Heme/Lypmh: Bleeding [ ]  Bruising [ ]  Enlarged lymph nodes [ ]   Endocrine: Visual blurring [ ]  Paresthesia [ ]  Polyuria [ ]  Polydypsea [ ]    Heat/cold intolerance [ ]  Hypoglycemia [ ]   Family history- Review and unchanged Social history- Review and unchanged Physical Exam: BP 110/72  Pulse 72  Temp(Src) 98.4 F (36.9 C)  Resp 16  Ht 5\' 1"  (1.549 m)  Wt 149 lb (67.586 kg)  BMI 28.17 kg/m2 Wt Readings from Last 3 Encounters:  07/03/14 149 lb (67.586 kg)  03/25/14 152 lb 6.4 oz (69.128 kg)  03/21/14 152 lb 9.6 oz (69.219 kg)   General Appearance: Well nourished, in no apparent distress. Eyes: PERRLA, EOMs, conjunctiva no swelling or erythema Sinuses: No Frontal/maxillary tenderness ENT/Mouth: Ext aud canals clear, TMs without erythema, bulging. No erythema, swelling, or exudate on post pharynx.  Tonsils not swollen or erythematous. Hearing normal.  Neck: Supple,  thyroid normal.  Respiratory: Respiratory effort normal, BS equal bilaterally without rales, rhonchi, wheezing or stridor.  Cardio: RRR with no MRGs. Brisk peripheral pulses without edema.  Abdomen: Soft, + BS.  Non tender, no guarding, rebound, hernias, masses. Lymphatics: Non tender without lymphadenopathy.  Musculoskeletal: Full ROM, 5/5 strength, normal gait.  Skin: Warm, dry  without rashes, lesions, ecchymosis.  Neuro: Cranial nerves intact. Normal muscle tone, no cerebellar symptoms. Sensation intact.  Psych: Awake and oriented X 3, normal affect, Insight and Judgment appropriate.    Quentin Mulling 1:56 PM

## 2014-07-04 LAB — HEPATIC FUNCTION PANEL
ALT: 15 U/L (ref 0–35)
AST: 15 U/L (ref 0–37)
Albumin: 4.4 g/dL (ref 3.5–5.2)
Alkaline Phosphatase: 60 U/L (ref 39–117)
BILIRUBIN DIRECT: 0.1 mg/dL (ref 0.0–0.3)
Indirect Bilirubin: 0.6 mg/dL (ref 0.2–1.2)
Total Bilirubin: 0.7 mg/dL (ref 0.2–1.2)
Total Protein: 7.1 g/dL (ref 6.0–8.3)

## 2014-07-04 LAB — BASIC METABOLIC PANEL WITH GFR
BUN: 8 mg/dL (ref 6–23)
CO2: 25 meq/L (ref 19–32)
CREATININE: 0.7 mg/dL (ref 0.50–1.10)
Calcium: 9.1 mg/dL (ref 8.4–10.5)
Chloride: 104 mEq/L (ref 96–112)
GFR, Est African American: >89 mL/min
GFR, Est Non African American: >89 mL/min
GLUCOSE: 82 mg/dL (ref 70–99)
Potassium: 4.2 mEq/L (ref 3.5–5.3)
SODIUM: 137 meq/L (ref 135–145)

## 2014-07-04 LAB — LIPID PANEL
Cholesterol: 171 mg/dL (ref 0–200)
HDL: 60 mg/dL (ref >39)
LDL Cholesterol: 88 mg/dL (ref 0–99)
Total CHOL/HDL Ratio: 2.9 Ratio
Triglycerides: 114 mg/dL (ref <150)
VLDL: 23 mg/dL (ref 0–40)

## 2014-07-04 LAB — MAGNESIUM: Magnesium: 2.1 mg/dL (ref 1.5–2.5)

## 2014-07-04 LAB — TSH: TSH: 3.278 u[IU]/mL (ref 0.350–4.500)

## 2014-07-04 LAB — VITAMIN D 25 HYDROXY (VIT D DEFICIENCY, FRACTURES): Vit D, 25-Hydroxy: 90 ng/mL — ABNORMAL HIGH (ref 30–89)

## 2014-07-11 ENCOUNTER — Other Ambulatory Visit: Payer: Self-pay | Admitting: Physician Assistant

## 2014-07-11 ENCOUNTER — Telehealth: Payer: Self-pay

## 2014-07-11 MED ORDER — PREDNISONE 20 MG PO TABS: ORAL_TABLET | ORAL | Status: DC

## 2014-07-11 NOTE — Telephone Encounter (Signed)
Received call from patient complaining of back and leg pain states she has a pinched nerve, states ibuprofen and tramadol not working. Advised patient that per Quentin MullingAmanda Collier, PA she would send her in some prednisone, patient aware to go to ER or Urgent Care over weekend if symptoms persist.

## 2014-07-14 ENCOUNTER — Encounter: Payer: Self-pay | Admitting: Physician Assistant

## 2014-07-14 ENCOUNTER — Ambulatory Visit (INDEPENDENT_AMBULATORY_CARE_PROVIDER_SITE_OTHER): Payer: BC Managed Care – PPO | Admitting: Physician Assistant

## 2014-07-14 VITALS — BP 122/80 | HR 68 | Temp 97.9°F | Resp 16 | Ht 61.0 in | Wt 153.0 lb

## 2014-07-14 DIAGNOSIS — M545 Low back pain, unspecified: Secondary | ICD-10-CM

## 2014-07-14 DIAGNOSIS — R350 Frequency of micturition: Secondary | ICD-10-CM

## 2014-07-14 MED ORDER — HYDROCODONE-ACETAMINOPHEN 5-325 MG PO TABS: 1.0000 | ORAL_TABLET | Freq: Four times a day (QID) | ORAL | Status: DC | PRN

## 2014-07-14 MED ORDER — CELECOXIB 200 MG PO CAPS: 200.0000 mg | ORAL_CAPSULE | Freq: Every day | ORAL | Status: DC

## 2014-07-14 NOTE — Patient Instructions (Signed)
Sciatica with Rehab The sciatic nerve runs from the back down the leg and is responsible for sensation and control of the muscles in the back (posterior) side of the thigh, lower leg, and foot. Sciatica is a condition that is characterized by inflammation of this nerve.  SYMPTOMS   Signs of nerve damage, including numbness and/or weakness along the posterior side of the lower extremity.  Pain in the back of the thigh that may also travel down the leg.  Pain that worsens when sitting for long periods of time.  Occasionally, pain in the back or buttock. CAUSES  Inflammation of the sciatic nerve is the cause of sciatica. The inflammation is due to something irritating the nerve. Common sources of irritation include:  Sitting for long periods of time.  Direct trauma to the nerve.  Arthritis of the spine.  Herniated or ruptured disk.  Slipping of the vertebrae (spondylolisthesis).  Pressure from soft tissues, such as muscles or ligament-like tissue (fascia). RISK INCREASES WITH:  Sports that place pressure or stress on the spine (football or weightlifting).  Poor strength and flexibility.  Failure to warm up properly before activity.  Family history of low back pain or disk disorders.  Previous back injury or surgery.  Poor body mechanics, especially when lifting, or poor posture. PREVENTION   Warm up and stretch properly before activity.  Maintain physical fitness:  Strength, flexibility, and endurance.  Cardiovascular fitness.  Learn and use proper technique, especially with posture and lifting. When possible, have coach correct improper technique.  Avoid activities that place stress on the spine. PROGNOSIS If treated properly, then sciatica usually resolves within 6 weeks. However, occasionally surgery is necessary.  RELATED COMPLICATIONS   Permanent nerve damage, including pain, numbness, tingle, or weakness.  Chronic back pain.  Risks of surgery: infection,  bleeding, nerve damage, or damage to surrounding tissues. TREATMENT Treatment initially involves resting from any activities that aggravate your symptoms. The use of ice and medication may help reduce pain and inflammation. The use of strengthening and stretching exercises may help reduce pain with activity. These exercises may be performed at home or with referral to a therapist. A therapist may recommend further treatments, such as transcutaneous electronic nerve stimulation (TENS) or ultrasound. Your caregiver may recommend corticosteroid injections to help reduce inflammation of the sciatic nerve. If symptoms persist despite non-surgical (conservative) treatment, then surgery may be recommended. MEDICATION  If pain medication is necessary, then nonsteroidal anti-inflammatory medications, such as aspirin and ibuprofen, or other minor pain relievers, such as acetaminophen, are often recommended.  Do not take pain medication for 7 days before surgery.  Prescription pain relievers may be given if deemed necessary by your caregiver. Use only as directed and only as much as you need.  Ointments applied to the skin may be helpful.  Corticosteroid injections may be given by your caregiver. These injections should be reserved for the most serious cases, because they may only be given a certain number of times. HEAT AND COLD  Cold treatment (icing) relieves pain and reduces inflammation. Cold treatment should be applied for 10 to 15 minutes every 2 to 3 hours for inflammation and pain and immediately after any activity that aggravates your symptoms. Use ice packs or massage the area with a piece of ice (ice massage).  Heat treatment may be used prior to performing the stretching and strengthening activities prescribed by your caregiver, physical therapist, or athletic trainer. Use a heat pack or soak the injury in warm water.   SEEK MEDICAL CARE IF:  Treatment seems to offer no benefit, or the condition  worsens.  Any medications produce adverse side effects. EXERCISES  RANGE OF MOTION (ROM) AND STRETCHING EXERCISES - Sciatica Most people with sciatic will find that their symptoms worsen with either excessive bending forward (flexion) or arching at the low back (extension). The exercises which will help resolve your symptoms will focus on the opposite motion. Your physician, physical therapist or athletic trainer will help you determine which exercises will be most helpful to resolve your low back pain. Do not complete any exercises without first consulting with your clinician. Discontinue any exercises which worsen your symptoms until you speak to your clinician. If you have pain, numbness or tingling which travels down into your buttocks, leg or foot, the goal of the therapy is for these symptoms to move closer to your back and eventually resolve. Occasionally, these leg symptoms will get better, but your low back pain may worsen; this is typically an indication of progress in your rehabilitation. Be certain to be very alert to any changes in your symptoms and the activities in which you participated in the 24 hours prior to the change. Sharing this information with your clinician will allow him/her to most efficiently treat your condition. These exercises may help you when beginning to rehabilitate your injury. Your symptoms may resolve with or without further involvement from your physician, physical therapist or athletic trainer. While completing these exercises, remember:   Restoring tissue flexibility helps normal motion to return to the joints. This allows healthier, less painful movement and activity.  An effective stretch should be held for at least 30 seconds.  A stretch should never be painful. You should only feel a gentle lengthening or release in the stretched tissue. FLEXION RANGE OF MOTION AND STRETCHING EXERCISES: STRETCH - Flexion, Single Knee to Chest   Lie on a firm bed or floor  with both legs extended in front of you.  Keeping one leg in contact with the floor, bring your opposite knee to your chest. Hold your leg in place by either grabbing behind your thigh or at your knee.  Pull until you feel a gentle stretch in your low back. Hold __________ seconds.  Slowly release your grasp and repeat the exercise with the opposite side. Repeat __________ times. Complete this exercise __________ times per day.  STRETCH - Flexion, Double Knee to Chest  Lie on a firm bed or floor with both legs extended in front of you.  Keeping one leg in contact with the floor, bring your opposite knee to your chest.  Tense your stomach muscles to support your back and then lift your other knee to your chest. Hold your legs in place by either grabbing behind your thighs or at your knees.  Pull both knees toward your chest until you feel a gentle stretch in your low back. Hold __________ seconds.  Tense your stomach muscles and slowly return one leg at a time to the floor. Repeat __________ times. Complete this exercise __________ times per day.  STRETCH - Low Trunk Rotation   Lie on a firm bed or floor. Keeping your legs in front of you, bend your knees so they are both pointed toward the ceiling and your feet are flat on the floor.  Extend your arms out to the side. This will stabilize your upper body by keeping your shoulders in contact with the floor.  Gently and slowly drop both knees together to one side until   you feel a gentle stretch in your low back. Hold for __________ seconds.  Tense your stomach muscles to support your low back as you bring your knees back to the starting position. Repeat the exercise to the other side. Repeat __________ times. Complete this exercise __________ times per day  EXTENSION RANGE OF MOTION AND FLEXIBILITY EXERCISES: STRETCH - Extension, Prone on Elbows  Lie on your stomach on the floor, a bed will be too soft. Place your palms about shoulder  width apart and at the height of your head.  Place your elbows under your shoulders. If this is too painful, stack pillows under your chest.  Allow your body to relax so that your hips drop lower and make contact more completely with the floor.  Hold this position for __________ seconds.  Slowly return to lying flat on the floor. Repeat __________ times. Complete this exercise __________ times per day.  RANGE OF MOTION - Extension, Prone Press Ups  Lie on your stomach on the floor, a bed will be too soft. Place your palms about shoulder width apart and at the height of your head.  Keeping your back as relaxed as possible, slowly straighten your elbows while keeping your hips on the floor. You may adjust the placement of your hands to maximize your comfort. As you gain motion, your hands will come more underneath your shoulders.  Hold this position __________ seconds.  Slowly return to lying flat on the floor. Repeat __________ times. Complete this exercise __________ times per day.  STRENGTHENING EXERCISES - Sciatica  These exercises may help you when beginning to rehabilitate your injury. These exercises should be done near your "sweet spot." This is the neutral, low-back arch, somewhere between fully rounded and fully arched, that is your least painful position. When performed in this safe range of motion, these exercises can be used for people who have either a flexion or extension based injury. These exercises may resolve your symptoms with or without further involvement from your physician, physical therapist or athletic trainer. While completing these exercises, remember:   Muscles can gain both the endurance and the strength needed for everyday activities through controlled exercises.  Complete these exercises as instructed by your physician, physical therapist or athletic trainer. Progress with the resistance and repetition exercises only as your caregiver advises.  You may  experience muscle soreness or fatigue, but the pain or discomfort you are trying to eliminate should never worsen during these exercises. If this pain does worsen, stop and make certain you are following the directions exactly. If the pain is still present after adjustments, discontinue the exercise until you can discuss the trouble with your clinician. STRENGTHENING - Deep Abdominals, Pelvic Tilt   Lie on a firm bed or floor. Keeping your legs in front of you, bend your knees so they are both pointed toward the ceiling and your feet are flat on the floor.  Tense your lower abdominal muscles to press your low back into the floor. This motion will rotate your pelvis so that your tail bone is scooping upwards rather than pointing at your feet or into the floor.  With a gentle tension and even breathing, hold this position for __________ seconds. Repeat __________ times. Complete this exercise __________ times per day.  STRENGTHENING - Abdominals, Crunches   Lie on a firm bed or floor. Keeping your legs in front of you, bend your knees so they are both pointed toward the ceiling and your feet are flat on the   floor. Cross your arms over your chest.  Slightly tip your chin down without bending your neck.  Tense your abdominals and slowly lift your trunk high enough to just clear your shoulder blades. Lifting higher can put excessive stress on the low back and does not further strengthen your abdominal muscles.  Control your return to the starting position. Repeat __________ times. Complete this exercise __________ times per day.  STRENGTHENING - Quadruped, Opposite UE/LE Lift  Assume a hands and knees position on a firm surface. Keep your hands under your shoulders and your knees under your hips. You may place padding under your knees for comfort.  Find your neutral spine and gently tense your abdominal muscles so that you can maintain this position. Your shoulders and hips should form a rectangle  that is parallel with the floor and is not twisted.  Keeping your trunk steady, lift your right hand no higher than your shoulder and then your left leg no higher than your hip. Make sure you are not holding your breath. Hold this position __________ seconds.  Continuing to keep your abdominal muscles tense and your back steady, slowly return to your starting position. Repeat with the opposite arm and leg. Repeat __________ times. Complete this exercise __________ times per day.  STRENGTHENING - Abdominals and Quadriceps, Straight Leg Raise   Lie on a firm bed or floor with both legs extended in front of you.  Keeping one leg in contact with the floor, bend the other knee so that your foot can rest flat on the floor.  Find your neutral spine, and tense your abdominal muscles to maintain your spinal position throughout the exercise.  Slowly lift your straight leg off the floor about 6 inches for a count of 15, making sure to not hold your breath.  Still keeping your neutral spine, slowly lower your leg all the way to the floor. Repeat this exercise with each leg __________ times. Complete this exercise __________ times per day. POSTURE AND BODY MECHANICS CONSIDERATIONS - Sciatica Keeping correct posture when sitting, standing or completing your activities will reduce the stress put on different body tissues, allowing injured tissues a chance to heal and limiting painful experiences. The following are general guidelines for improved posture. Your physician or physical therapist will provide you with any instructions specific to your needs. While reading these guidelines, remember:  The exercises prescribed by your provider will help you have the flexibility and strength to maintain correct postures.  The correct posture provides the optimal environment for your joints to work. All of your joints have less wear and tear when properly supported by a spine with good posture. This means you will  experience a healthier, less painful body.  Correct posture must be practiced with all of your activities, especially prolonged sitting and standing. Correct posture is as important when doing repetitive low-stress activities (typing) as it is when doing a single heavy-load activity (lifting). RESTING POSITIONS Consider which positions are most painful for you when choosing a resting position. If you have pain with flexion-based activities (sitting, bending, stooping, squatting), choose a position that allows you to rest in a less flexed posture. You would want to avoid curling into a fetal position on your side. If your pain worsens with extension-based activities (prolonged standing, working overhead), avoid resting in an extended position such as sleeping on your stomach. Most people will find more comfort when they rest with their spine in a more neutral position, neither too rounded nor too   arched. Lying on a non-sagging bed on your side with a pillow between your knees, or on your back with a pillow under your knees will often provide some relief. Keep in mind, being in any one position for a prolonged period of time, no matter how correct your posture, can still lead to stiffness. PROPER SITTING POSTURE In order to minimize stress and discomfort on your spine, you must sit with correct posture Sitting with good posture should be effortless for a healthy body. Returning to good posture is a gradual process. Many people can work toward this most comfortably by using various supports until they have the flexibility and strength to maintain this posture on their own. When sitting with proper posture, your ears will fall over your shoulders and your shoulders will fall over your hips. You should use the back of the chair to support your upper back. Your low back will be in a neutral position, just slightly arched. You may place a small pillow or folded towel at the base of your low back for support.  When  working at a desk, create an environment that supports good, upright posture. Without extra support, muscles fatigue and lead to excessive strain on joints and other tissues. Keep these recommendations in mind: CHAIR:   A chair should be able to slide under your desk when your back makes contact with the back of the chair. This allows you to work closely.  The chair's height should allow your eyes to be level with the upper part of your monitor and your hands to be slightly lower than your elbows. BODY POSITION  Your feet should make contact with the floor. If this is not possible, use a foot rest.  Keep your ears over your shoulders. This will reduce stress on your neck and low back. INCORRECT SITTING POSTURES   If you are feeling tired and unable to assume a healthy sitting posture, do not slouch or slump. This puts excessive strain on your back tissues, causing more damage and pain. Healthier options include:  Using more support, like a lumbar pillow.  Switching tasks to something that requires you to be upright or walking.  Talking a brief walk.  Lying down to rest in a neutral-spine position. PROLONGED STANDING WHILE SLIGHTLY LEANING FORWARD  When completing a task that requires you to lean forward while standing in one place for a long time, place either foot up on a stationary 2-4 inch high object to help maintain the best posture. When both feet are on the ground, the low back tends to lose its slight inward curve. If this curve flattens (or becomes too large), then the back and your other joints will experience too much stress, fatigue more quickly and can cause pain.  CORRECT STANDING POSTURES Proper standing posture should be assumed with all daily activities, even if they only take a few moments, like when brushing your teeth. As in sitting, your ears should fall over your shoulders and your shoulders should fall over your hips. You should keep a slight tension in your abdominal  muscles to brace your spine. Your tailbone should point down to the ground, not behind your body, resulting in an over-extended swayback posture.  INCORRECT STANDING POSTURES  Common incorrect standing postures include a forward head, locked knees and/or an excessive swayback. WALKING Walk with an upright posture. Your ears, shoulders and hips should all line-up. PROLONGED ACTIVITY IN A FLEXED POSITION When completing a task that requires you to bend forward   at your waist or lean over a low surface, try to find a way to stabilize 3 of 4 of your limbs. You can place a hand or elbow on your thigh or rest a knee on the surface you are reaching across. This will provide you more stability so that your muscles do not fatigue as quickly. By keeping your knees relaxed, or slightly bent, you will also reduce stress across your low back. CORRECT LIFTING TECHNIQUES DO :   Assume a wide stance. This will provide you more stability and the opportunity to get as close as possible to the object which you are lifting.  Tense your abdominals to brace your spine; then bend at the knees and hips. Keeping your back locked in a neutral-spine position, lift using your leg muscles. Lift with your legs, keeping your back straight.  Test the weight of unknown objects before attempting to lift them.  Try to keep your elbows locked down at your sides in order get the best strength from your shoulders when carrying an object.  Always ask for help when lifting heavy or awkward objects. INCORRECT LIFTING TECHNIQUES DO NOT:   Lock your knees when lifting, even if it is a small object.  Bend and twist. Pivot at your feet or move your feet when needing to change directions.  Assume that you cannot safely pick up a paperclip without proper posture. Document Released: 11/07/2005 Document Revised: 03/24/2014 Document Reviewed: 02/19/2009 [employer] Patient Information 2015 [employer]. This information is not intended to  replace advice given to you by your health care provider. Make sure you discuss any questions you have with your health care provider.  

## 2014-07-14 NOTE — Progress Notes (Signed)
   Subjective:    Patient ID: Lauren [name], female    DOB: 11-Jul-1981, 34 y.o.   MRN: [id number]  HPI 33 y.o. female with history of FM presents with lower back pain for 4 days after she has started to walk 30 mins daily. She started to have right lower leg pain at work, with some left SI pain. She has left SI pain going down her lateral leg to her knee, some numbness and tingling outer thigh. She called the office Friday and got a prednisone taper, has been taking 4 iburofen  which helps, and muscle relaxer. It is keeping her up at night, worse with resting, better with walking. She has had some increasing gas, nausea, and she had a low grade temperature 99.1. Some hesitancy with urination, no incontinence, no numbness/tingling   Review of Systems  Constitutional: Negative.   HENT: Negative.   Respiratory: Negative.   Cardiovascular: Negative.   Gastrointestinal: Positive for nausea. Negative for abdominal pain, diarrhea and constipation.  Genitourinary: Positive for urgency and frequency.  Musculoskeletal: Positive for back pain and gait problem. Negative for arthralgias, joint swelling, myalgias, neck pain and neck stiffness.  Neurological: Negative.   Psychiatric/Behavioral: Negative.        Objective:   Physical Exam  Musculoskeletal:  Patient is able to ambulate well.  Gait is  antalgic. Straight leg raising negative bilaterally for radicular symptoms. Sensory exam in the legs is normal.  Knee reflexes are hyperreflexia Ankle reflexes are hyperreflexia Strength is normal and symmetric. There isparaspinal muscle spasm.  There is not midline tenderness.  ROM of spine with normal flexion, extension, lateral range of motion to the right and left, and rotation to the right and left.        Assessment & Plan:  Left SI pain, negative straight leg, no bowel/bladder problems Can try celebrex since ibuprofen/monic not helping, do hydrocodone at night for sleep, RICE, and  exercise given If not better with refer to orthopedics.   Fever, urinary hesitancy- check urine rule out stone/infection.

## 2014-07-15 LAB — URINALYSIS, ROUTINE W REFLEX MICROSCOPIC
Bilirubin Urine: NEGATIVE
GLUCOSE, UA: NEGATIVE mg/dL
Hgb urine dipstick: NEGATIVE
Ketones, ur: NEGATIVE mg/dL
LEUKOCYTES UA: NEGATIVE
Nitrite: NEGATIVE
PROTEIN: NEGATIVE mg/dL
Specific Gravity, Urine: 1.011 (ref 1.005–1.030)
Urobilinogen, UA: 0.2 mg/dL (ref 0.0–1.0)
pH: 7.5 (ref 5.0–8.0)

## 2014-07-15 LAB — URINE CULTURE

## 2014-07-23 ENCOUNTER — Other Ambulatory Visit: Payer: Self-pay | Admitting: Physician Assistant

## 2014-07-25 ENCOUNTER — Other Ambulatory Visit: Payer: Self-pay | Admitting: Physician Assistant

## 2014-08-31 ENCOUNTER — Other Ambulatory Visit: Payer: Self-pay | Admitting: Physician Assistant

## 2014-09-17 ENCOUNTER — Ambulatory Visit (INDEPENDENT_AMBULATORY_CARE_PROVIDER_SITE_OTHER): Payer: BC Managed Care – PPO | Admitting: Physician Assistant

## 2014-09-17 ENCOUNTER — Encounter: Payer: Self-pay | Admitting: Physician Assistant

## 2014-09-17 VITALS — BP 122/80 | HR 88 | Temp 98.6°F | Resp 16 | Ht 61.0 in | Wt 152.0 lb

## 2014-09-17 DIAGNOSIS — N390 Urinary tract infection, site not specified: Secondary | ICD-10-CM

## 2014-09-17 DIAGNOSIS — E039 Hypothyroidism, unspecified: Secondary | ICD-10-CM

## 2014-09-17 DIAGNOSIS — Z79899 Other long term (current) drug therapy: Secondary | ICD-10-CM

## 2014-09-17 DIAGNOSIS — F329 Major depressive disorder, single episode, unspecified: Secondary | ICD-10-CM

## 2014-09-17 DIAGNOSIS — E559 Vitamin D deficiency, unspecified: Secondary | ICD-10-CM

## 2014-09-17 DIAGNOSIS — F32A Depression, unspecified: Secondary | ICD-10-CM

## 2014-09-17 LAB — BASIC METABOLIC PANEL WITH GFR
BUN: 8 mg/dL (ref 6–23)
CALCIUM: 9.5 mg/dL (ref 8.4–10.5)
CO2: 26 mEq/L (ref 19–32)
Chloride: 104 mEq/L (ref 96–112)
Creat: 0.73 mg/dL (ref 0.50–1.10)
GFR, Est African American: >89 mL/min
GFR, Est Non African American: >89 mL/min
Glucose, Bld: 75 mg/dL (ref 70–99)
Potassium: 4.3 mEq/L (ref 3.5–5.3)
Sodium: 140 mEq/L (ref 135–145)

## 2014-09-17 LAB — HEPATIC FUNCTION PANEL
ALBUMIN: 4.2 g/dL (ref 3.5–5.2)
ALT: 9 U/L (ref 0–35)
AST: 12 U/L (ref 0–37)
Alkaline Phosphatase: 48 U/L (ref 39–117)
BILIRUBIN TOTAL: 0.4 mg/dL (ref 0.2–1.2)
Bilirubin, Direct: 0.1 mg/dL (ref 0.0–0.3)
Indirect Bilirubin: 0.3 mg/dL (ref 0.2–1.2)
TOTAL PROTEIN: 6.9 g/dL (ref 6.0–8.3)

## 2014-09-17 LAB — CBC WITH DIFFERENTIAL/PLATELET
BASOS ABS: 0.1 10*3/uL (ref 0.0–0.1)
Basophils Relative: 1 % (ref 0–1)
EOS PCT: 2 % (ref 0–5)
Eosinophils Absolute: 0.1 10*3/uL (ref 0.0–0.7)
HCT: 42.3 % (ref 36.0–46.0)
Hemoglobin: 14 g/dL (ref 12.0–15.0)
Lymphocytes Relative: 36 % (ref 12–46)
Lymphs Abs: 2.3 10*3/uL (ref 0.7–4.0)
MCH: 29.5 pg (ref 26.0–34.0)
MCHC: 33.1 g/dL (ref 30.0–36.0)
MCV: 89.1 fL (ref 78.0–100.0)
Monocytes Absolute: 0.6 10*3/uL (ref 0.1–1.0)
Monocytes Relative: 9 % (ref 3–12)
Neutro Abs: 3.3 10*3/uL (ref 1.7–7.7)
Neutrophils Relative %: 52 % (ref 43–77)
PLATELETS: 189 10*3/uL (ref 150–400)
RBC: 4.75 MIL/uL (ref 3.87–5.11)
RDW: 13.5 % (ref 11.5–15.5)
WBC: 6.4 10*3/uL (ref 4.0–10.5)

## 2014-09-17 LAB — TSH: TSH: 2.336 u[IU]/mL (ref 0.350–4.500)

## 2014-09-17 MED ORDER — VORTIOXETINE HBR 10 MG PO TABS: 10.0000 mg | ORAL_TABLET | Freq: Every day | ORAL | Status: DC

## 2014-09-17 NOTE — Progress Notes (Signed)
Subjective:    Patient ID: Lauren [name], female    DOB: [birth year], 33 y.o.   MRN: [id number]  CC: Fatigue and Depression  HPI A 33yo Caucasian female presents today due to fatigue and depression.  She states that several weeks ago she started to feel irritable at everything and very emotional and states that it is getting worse.  She took the past 2 days off of work.  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  She states the student has been "pushing her buttons and she cannot handle it"; furthermore, work is creating a lot of stress and extra work for her.  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    She reports "eating like crazy" and having nausea from mostly eating sweets.  She has been doing weight watchers and lost weight, but according to her gained it back.    She reports being extremely tired and "feels like she has no control".  She denies SI thoughts and SI plans, but states she "has HI thoughts like everyone else gets when they get angry", but has no HI plans and states "she does not want to hurt anyone".  She is married and has two girls (35yo and 35yo).  The girls ask if she is all right and notice that she is upset.  Says her husband is trying to help by doing more chores around the house so she can rest and not worry; but she states that he doesn't know how to help her out more than what he is doing now.    Dysuria- Patient states she has dysuria and recurrent urinary infections.  She reports urinary frequency and urgency.  She denies vaginal discharge, odor and itchiness.  She states her LMP was October 9th, 2015 and she gets her menstrual periods regularly.  Complains of = [X]      Denies = [ ]  Hot Flashes [ ]  Weight Gain [ x] Fatigue [ x] Difficulty Swallowing [ ]  Voice Changes [ ]  Palpitations [ ]  Hot/Cold Intolerance [ ]   Skin/Hair Texture Changes [ ]  Diarrhea/Constipation [  ] Tremors [ ]  Depression [ x] Anxiety [ x]  Synthroid/Levothyroxine Dose 137mg- Takes 1 tablet daily for 4 days and 1/2 tablet daily for 3 days.   Patient states she needs to take Synthroid Brand.    Review of Systems  Constitutional: Positive for appetite change and fatigue. Negative for fever and chills.  HENT: Negative.   Eyes: Negative.   Respiratory: Negative.   Cardiovascular: Negative.   Gastrointestinal: Positive for nausea and abdominal pain.       Abdominal pain in the lower RLQ.  Endocrine: Negative.   Genitourinary: Positive for dysuria, urgency and frequency. Negative for flank pain, vaginal discharge, vaginal pain and dyspareunia.  Musculoskeletal: Negative.   Skin: Negative.   Neurological: Negative.   Hematological: Negative.   Psychiatric/Behavioral: Positive for sleep disturbance and decreased concentration. Negative for suicidal ideas. The patient is nervous/anxious.        Has Stress, Anxiety and Depression.   PMH Past Medical History  Diagnosis Date  . Depression   . Hypothyroidism   . HSV-1 infection   . Fibromyalgia   . Preeclampsia   . Hypertension   . Anxiety   . Hemorrhoids   . Constipation   . GERD (gastroesophageal reflux disease)   . Other abnormal glucose   .  Anemia    Current Outpatient Prescriptions on File Prior to Visit  Medication Sig Dispense Refill  . ALPRAZolam (XANAX) 1 MG tablet Take 1 tablet (1 mg total) by mouth 2 (two) times daily as needed for sleep.  60 tablet  1  . Cholecalciferol (VITAMIN D) 2000 UNITS CAPS Take 8,000 Units by mouth daily.       Marland Kitchen FLUoxetine (PROZAC) 40 MG capsule TAKE 1 CAPSULE (40 MG TOTAL) BY MOUTH DAILY.  90 capsule  1  . HYDROcodone-acetaminophen (NORCO) 5-325 MG per tablet Take 1 tablet by mouth every 6 (six) hours as needed for moderate pain.  60 tablet  0  . ibuprofen (ADVIL,MOTRIN) 200 MG tablet Take 200 mg by mouth every 6 (six) hours as needed for pain. Takes 4 tablets (800 mg) every 4-6 hrs prn       . levothyroxine (SYNTHROID) 100 MCG tablet Take 1 to 1&1/2 tablets daily as directed for Thyroid  145 tablet  0  . magnesium 30 MG tablet Take 250 mg by mouth 1 day or 1 dose.      . metroNIDAZOLE (METROGEL VAGINAL) 0.75 % vaginal gel Place 1 Applicatorful vaginally at bedtime. For 5 days  70 g  2  . polyethylene glycol (MIRALAX / GLYCOLAX) packet Take 17 g by mouth daily.       No current facility-administered medications on file prior to visit.   Allergies  Allergen Reactions  . Doxycycline Other (See Comments)    GI UPSET  . Effexor [Venlafaxine] Other (See Comments)    DYSP         DYSPHORIA  . Prednisone Other (See Comments)    FLUSHED  . Savella [Milnacipran Hcl] Other (See Comments)    NO RELIEF   Family History: Family history reviewed and Mother and Brother had Bipolar Disorder.   Psychosocial History:  History   Social History  . Marital Status: Married    Spouse Name: N/A    Number of Children: 2  . Years of Education: N/A   Occupational History  . [employer]   Social History Main Topics  . Smoking status: Never Smoker   . Smokeless tobacco: Never Used  . Alcohol Use: Yes     Comment: occ  . Drug Use: No  . Sexual Activity: Yes    Birth Control/ Protection: None     Comment: husband has had a vasectomy   Other Topics Concern  . Not on file   Social History Narrative   4 caffeine drinks daily      BP 122/80  Pulse 88  Temp(Src) 98.6 F (37 C) (Temporal)  Resp 16  Ht 5' 1"  (1.549 m)  Wt 152 lb (68.947 kg)  BMI 28.74 kg/m2  LMP 08/29/2014 Objective:   Physical Exam  Constitutional: She is oriented to person, place, and time. Vital signs are normal. She appears well-developed and well-nourished. She does not appear ill. No distress.  Patient is emotional (crying) and anxious seen sitting on exam table.  HENT:  Head: Normocephalic.  Right Ear: Tympanic membrane, external ear and ear canal normal.  Left Ear:  Tympanic membrane, external ear and ear canal normal.  Nose: Nose normal. No mucosal edema. Right sinus exhibits no maxillary sinus tenderness and no frontal sinus tenderness. Left sinus exhibits no maxillary sinus tenderness and no frontal sinus tenderness.  Mouth/Throat: Uvula is midline, oropharynx is clear and moist and mucous membranes are normal. No uvula swelling. No oropharyngeal exudate, posterior oropharyngeal  edema or posterior oropharyngeal erythema.  Eyes: Conjunctivae, EOM and lids are normal. Pupils are equal, round, and reactive to light. Right eye exhibits no discharge. Left eye exhibits no discharge. No scleral icterus.  Neck: Trachea normal, normal range of motion and full passive range of motion without pain. Neck supple. No tracheal deviation present. No mass and no thyromegaly present.  Cardiovascular: Normal rate, regular rhythm, S1 normal, S2 normal, normal heart sounds, intact distal pulses and normal pulses.  PMI is not displaced.  Exam reveals no gallop, no distant heart sounds and no friction rub.   No murmur heard. Pulmonary/Chest: Effort normal and breath sounds normal. No stridor. No respiratory distress. She has no decreased breath sounds. She has no wheezes. She has no rhonchi. She has no rales. She exhibits no tenderness.  Abdominal: Soft. Normal appearance and bowel sounds are normal. She exhibits no distension, no abdominal bruit, no pulsatile midline mass and no mass. There is no splenomegaly or hepatomegaly. There is tenderness in the right lower quadrant. There is no rigidity, no rebound, no guarding and no CVA tenderness.  Tenderness in lower RLQ upon palpation.  Musculoskeletal: Normal range of motion.  Lymphadenopathy:       Head (right side): No submental, no submandibular, no tonsillar, no preauricular, no posterior auricular and no occipital adenopathy present.       Head (left side): No submental, no submandibular, no tonsillar, no preauricular, no posterior  auricular and no occipital adenopathy present.    She has no cervical adenopathy.       Right: No supraclavicular adenopathy present.       Left: No supraclavicular adenopathy present.  Neurological: She is alert and oriented to person, place, and time. No cranial nerve deficit. Coordination and gait normal.  Skin: Skin is warm, dry and intact. No rash noted. No cyanosis. Nails show no clubbing.  Psychiatric: Her speech is normal and behavior is normal. Judgment and thought content normal. Her mood appears anxious. Thought content is not paranoid and not delusional. Cognition and memory are normal. She exhibits a depressed mood. She expresses no homicidal and no suicidal ideation. She expresses no suicidal plans and no homicidal plans.      Assessment & Plan:  OVER 30 minutes of exam, counseling, chart review, referral performed 1. Depression Try contacting this treatment center for counseling:  [facility] taking the Prozac.  Start taking the Brintellix 83m (2 boxes of 543msamples given)- Take 1 (107m64mtablet by mouth daily for 1 week.  Then take 2 (107mg42mablets by mouth daily.  I want you to be on 10mg37mly after the first week.  Discussed medication effects and SE's.  Patient agreed to medication change and treatment plan. We are low on Brintellix samples.  I will have the office call the drug representative to get more samples.  I will message you when samples get in.  Stress management is the recommended treatment for stress.The goals of stress management are reducing stressful life events and coping with stress in healthy ways.  Techniques for reducing stressful life events include the following:  Stress identification. Self-monitor for stress and identify what causes stress for you. These skills may help you to avoid some stressful events.  Time management. Set your priorities, keep a calendar of events, and learn to say  "no." These tools can help you avoid making too many commitments. Techniques for coping with stress include the following:  Rethinking the problem. Try to think realistically about stressful events rather than ignoring them or overreacting. Try to find the positives in a stressful situation rather than focusing on the negatives.  Exercise. Physical exercise can release both physical and emotional tension. The key is to find a form of exercise you enjoy and do it regularly.  Relaxation techniques. These relax the body and mind. Examples include yoga, meditation, tai chi, biofeedback, deep breathing, progressive muscle relaxation, listening to music, being out in nature, journaling, and other hobbies. Again, the key is to find one or more that you enjoy and can do regularly.  Healthy lifestyle. Eat a balanced diet, get plenty of sleep, and do not smoke. Avoid using alcohol or drugs to relax.  Strong support network. Spend time with family, friends, or other people you enjoy being around.Express your feelings and talk things over with someone you trust. Counseling or talktherapy with a mental health professional may be helpful if you are having difficulty managing stress on your own. - Vit D  25 hydroxy (rtn osteoporosis monitoring) - Vortioxetine HBr (BRINTELLIX) 10 MG TABS; Take 1 tablet (10 mg total) by mouth daily.  Dispense: 30 tablet; Refill: 0  2. Hypothyroidism, unspecified hypothyroidism type Continue Synthroid as prescribed.  Will adjust dose depending on results. - TSH  3. UTI (lower urinary tract infection) Will prescribe an antibiotic depending on lab results. - CBC with Differential - Urinalysis, Routine w reflex microscopic - Urine culture  4. Encounter for long-term (current) use of medications Will monitor liver and kidney function. - CBC with Differential - BASIC METABOLIC PANEL WITH GFR - Hepatic function panel  Please make an appointment for a follow up visit in 2-4  weeks.  If you have any questions or concerns, please call the office or message me through your My Chart access.  I will message you through My Chart for your lab results.  Marva Hendryx, Stephani Police, PA-C 5:24 PM [facility]

## 2014-09-17 NOTE — Patient Instructions (Addendum)
Try contacting this treatment center for counseling: [facility] [place]  Take the Brintellix 34m- Take 540mtablet by mouth daily for 1 week.  Then take 1051mablet by mouth daily.    I will message you when samples get in.  Please follow up in 2-4 weeks.    I will message you on My Chart for your lab results.  Stress and Stress Management Stress is a normal reaction to life events. It is what you feel when life demands more than you are used to or more than you can handle. Some stress can be useful. For example, the stress reaction can help you catch the last bus of the day, study for a test, or meet a deadline at work. But stress that occurs too often or for too long can cause problems. It can affect your emotional health and interfere with relationships and normal daily activities. Too much stress can weaken your immune system and increase your risk for physical illness. If you already have a medical problem, stress can make it worse. CAUSES  All sorts of life events may cause stress. An event that causes stress for one person may not be stressful for another person. Major life events commonly cause stress. These may be positive or negative. Examples include losing your job, moving into a new home, getting married, having a baby, or losing a loved one. Less obvious life events may also cause stress, especially if they occur day after day or in combination. Examples include working long hours, driving in traffic, caring for children, being in debt, or being in a difficult relationship. SIGNS AND SYMPTOMS Stress may cause emotional symptoms including, the following:  Anxiety. This is feeling worried, afraid, on edge, overwhelmed, or out of control.  Anger. This is feeling irritated or impatient.  Depression. This is feeling sad, down, helpless, or guilty.  Difficulty focusing, remembering, or making decisions. Stress may cause physical  symptoms, including the following:   Aches and pains. These may affect your head, neck, back, stomach, or other areas of your body.  Tight muscles or clenched jaw.  Low energy or trouble sleeping. Stress may cause unhealthy behaviors, including the following:   Eating to feel better (overeating) or skipping meals.  Sleeping too little, too much, or both.  Working too much or putting off tasks (procrastination).  Smoking, drinking alcohol, or using drugs to feel better. DIAGNOSIS  Stress is diagnosed through an assessment by your health care provider. Your health care provider will ask questions about your symptoms and any stressful life events.Your health care provider will also ask about your medical history and may order blood tests or other tests. Certain medical conditions and medicine can cause physical symptoms similar to stress. Mental illness can cause emotional symptoms and unhealthy behaviors similar to stress. Your health care provider may refer you to a mental health professional for further evaluation.  TREATMENT  Stress management is the recommended treatment for stress.The goals of stress management are reducing stressful life events and coping with stress in healthy ways.  Techniques for reducing stressful life events include the following:  Stress identification. Self-monitor for stress and identify what causes stress for you. These skills may help you to avoid some stressful events.  Time management. Set your priorities, keep a calendar of events, and learn to say "no." These tools can help you avoid making too many commitments. Techniques for coping with stress include the following:  Rethinking the problem. Try  to think realistically about stressful events rather than ignoring them or overreacting. Try to find the positives in a stressful situation rather than focusing on the negatives.  Exercise. Physical exercise can release both physical and emotional tension.  The key is to find a form of exercise you enjoy and do it regularly.  Relaxation techniques. These relax the body and mind. Examples include yoga, meditation, tai chi, biofeedback, deep breathing, progressive muscle relaxation, listening to music, being out in nature, journaling, and other hobbies. Again, the key is to find one or more that you enjoy and can do regularly.  Healthy lifestyle. Eat a balanced diet, get plenty of sleep, and do not smoke. Avoid using alcohol or drugs to relax.  Strong support network. Spend time with family, friends, or other people you enjoy being around.Express your feelings and talk things over with someone you trust. Counseling or talktherapy with a mental health professional may be helpful if you are having difficulty managing stress on your own. Medicine is typically not recommended for the treatment of stress.Talk to your health care provider if you think you need medicine for symptoms of stress. HOME CARE INSTRUCTIONS  Keep all follow-up visits as directed by your health care provider.  Take all medicines as directed by your health care provider. SEEK MEDICAL CARE IF:  Your symptoms get worse or you start having new symptoms.  You feel overwhelmed by your problems and can no longer manage them on your own. SEEK IMMEDIATE MEDICAL CARE IF:  You feel like hurting yourself or someone else. Document Released: 05/03/2001 Document Revised: 03/24/2014 Document Reviewed: 07/02/2013 [facility] Patient Information 2015 [place], [place]. This information is not intended to replace advice given to you by your health care provider. Make sure you discuss any questions you have with your health care provider.

## 2014-09-18 LAB — URINALYSIS, ROUTINE W REFLEX MICROSCOPIC
Bilirubin Urine: NEGATIVE
Glucose, UA: NEGATIVE mg/dL
HGB URINE DIPSTICK: NEGATIVE
KETONES UR: NEGATIVE mg/dL
Leukocytes, UA: NEGATIVE
Nitrite: NEGATIVE
Protein, ur: NEGATIVE mg/dL
SPECIFIC GRAVITY, URINE: 1.014 (ref 1.005–1.030)
UROBILINOGEN UA: 0.2 mg/dL (ref 0.0–1.0)
pH: 7.5 (ref 5.0–8.0)

## 2014-09-18 LAB — VITAMIN D 25 HYDROXY (VIT D DEFICIENCY, FRACTURES): Vit D, 25-Hydroxy: 79 ng/mL (ref 30–89)

## 2014-09-19 LAB — URINE CULTURE
Colony Count: NO GROWTH
Organism ID, Bacteria: NO GROWTH

## 2014-09-22 ENCOUNTER — Encounter: Payer: Self-pay | Admitting: Physician Assistant

## 2014-09-22 ENCOUNTER — Other Ambulatory Visit: Payer: Self-pay | Admitting: Physician Assistant

## 2014-09-22 ENCOUNTER — Other Ambulatory Visit: Payer: Self-pay | Admitting: *Deleted

## 2014-09-22 DIAGNOSIS — F32A Depression, unspecified: Secondary | ICD-10-CM

## 2014-09-22 DIAGNOSIS — F329 Major depressive disorder, single episode, unspecified: Secondary | ICD-10-CM

## 2014-09-22 MED ORDER — VORTIOXETINE HBR 10 MG PO TABS: 10.0000 mg | ORAL_TABLET | Freq: Every day | ORAL | Status: DC

## 2014-10-08 ENCOUNTER — Encounter: Payer: Self-pay | Admitting: Physician Assistant

## 2014-10-08 ENCOUNTER — Ambulatory Visit (INDEPENDENT_AMBULATORY_CARE_PROVIDER_SITE_OTHER): Payer: BC Managed Care – PPO | Admitting: Physician Assistant

## 2014-10-08 VITALS — BP 128/80 | HR 78 | Temp 98.6°F | Resp 18 | Wt 156.4 lb

## 2014-10-08 DIAGNOSIS — R110 Nausea: Secondary | ICD-10-CM

## 2014-10-08 DIAGNOSIS — F329 Major depressive disorder, single episode, unspecified: Secondary | ICD-10-CM

## 2014-10-08 DIAGNOSIS — F32A Depression, unspecified: Secondary | ICD-10-CM

## 2014-10-08 MED ORDER — ONDANSETRON HCL 4 MG PO TABS: 4.0000 mg | ORAL_TABLET | Freq: Three times a day (TID) | ORAL | Status: DC | PRN

## 2014-10-08 MED ORDER — ALPRAZOLAM 0.25 MG PO TABS: 0.2500 mg | ORAL_TABLET | Freq: Two times a day (BID) | ORAL | Status: DC

## 2014-10-08 NOTE — Patient Instructions (Addendum)
-Continue the Brintellix as prescribed  -Start taking xanax 0.57m twice during the day to help with anxiety. -Contact Dr. RChucky Mayat [phone] that she can help manage your depression and anxiety better.    Please come back in 2 weeks for lab only appt.   Depression Depression refers to feeling sad, low, down in the dumps, blue, gloomy, or empty. In general, there are two kinds of depression: 1. Normal sadness or normal grief. This kind of depression is one that we all feel from time to time after upsetting life experiences, such as the loss of a job or the ending of a relationship. This kind of depression is considered normal, is short lived, and resolves within a few days to 2 weeks. Depression experienced after the loss of a loved one (bereavement) often lasts longer than 2 weeks but normally gets better with time. 2. Clinical depression. This kind of depression lasts longer than normal sadness or normal grief or interferes with your ability to function at home, at work, and in school. It also interferes with your personal relationships. It affects almost every aspect of your life. Clinical depression is an illness. Symptoms of depression can also be caused by conditions other than those mentioned above, such as:  Physical illness. Some physical illnesses, including underactive thyroid gland (hypothyroidism), severe anemia, specific types of cancer, diabetes, uncontrolled seizures, heart and lung problems, strokes, and chronic pain are commonly associated with symptoms of depression.  Side effects of some prescription medicine. In some people, certain types of medicine can cause symptoms of depression.  Substance abuse. Abuse of alcohol and illicit drugs can cause symptoms of depression. SYMPTOMS Symptoms of normal sadness and normal grief include the following:  Feeling sad or crying for short periods of time.  Not caring about anything (apathy).  Difficulty sleeping or  sleeping too much.  No longer able to enjoy the things you used to enjoy.  Desire to be by oneself all the time (social isolation).  Lack of energy or motivation.  Difficulty concentrating or remembering.  Change in appetite or weight.  Restlessness or agitation. Symptoms of clinical depression include the same symptoms of normal sadness or normal grief and also the following symptoms:  Feeling sad or crying all the time.  Feelings of guilt or worthlessness.  Feelings of hopelessness or helplessness.  Thoughts of suicide or the desire to harm yourself (suicidal ideation).  Loss of touch with reality (psychotic symptoms). Seeing or hearing things that are not real (hallucinations) or having false beliefs about your life or the people around you (delusions and paranoia). DIAGNOSIS  The diagnosis of clinical depression is usually based on how bad the symptoms are and how long they have lasted. Your health care provider will also ask you questions about your medical history and substance use to find out if physical illness, use of prescription medicine, or substance abuse is causing your depression. Your health care provider may also order blood tests. TREATMENT  Often, normal sadness and normal grief do not require treatment. However, sometimes antidepressant medicine is given for bereavement to ease the depressive symptoms until they resolve. The treatment for clinical depression depends on how bad the symptoms are but often includes antidepressant medicine, counseling with a mental health professional, or both. Your health care provider will help to determine what treatment is best for you. Depression caused by physical illness usually goes away with appropriate medical treatment of the illness. If prescription medicine is causing depression, talk with  your health care provider about stopping the medicine, decreasing the dose, or changing to another medicine. Depression caused by the  abuse of alcohol or illicit drugs goes away when you stop using these substances. Some adults need professional help in order to stop drinking or using drugs. SEEK IMMEDIATE MEDICAL CARE IF:  You have thoughts about hurting yourself or others.  You lose touch with reality (have psychotic symptoms).  You are taking medicine for depression and have a serious side effect. FOR MORE INFORMATION  National Alliance on Mental Illness: www.nami.[employer]: https://carter.com/ Document Released: 11/04/2000 Document Revised: 03/24/2014 Document Reviewed: 02/06/2012 [facility] Patient Information 2015 [place], [place]. This information is not intended to replace advice given to you by your health care provider. Make sure you discuss any questions you have with your health care provider.     Stress and Stress Management Stress is a normal reaction to life events. It is what you feel when life demands more than you are used to or more than you can handle. Some stress can be useful. For example, the stress reaction can help you catch the last bus of the day, study for a test, or meet a deadline at work. But stress that occurs too often or for too long can cause problems. It can affect your emotional health and interfere with relationships and normal daily activities. Too much stress can weaken your immune system and increase your risk for physical illness. If you already have a medical problem, stress can make it worse. CAUSES  All sorts of life events may cause stress. An event that causes stress for one person may not be stressful for another person. Major life events commonly cause stress. These may be positive or negative. Examples include losing your job, moving into a new home, getting married, having a baby, or losing a loved one. Less obvious life events may also cause stress, especially if they occur day after day or in combination. Examples include working long hours,  driving in traffic, caring for children, being in debt, or being in a difficult relationship. SIGNS AND SYMPTOMS Stress may cause emotional symptoms including, the following: 3. Anxiety. This is feeling worried, afraid, on edge, overwhelmed, or out of control. 4. Anger. This is feeling irritated or impatient. 5. Depression. This is feeling sad, down, helpless, or guilty. 6. Difficulty focusing, remembering, or making decisions. Stress may cause physical symptoms, including the following:   Aches and pains. These may affect your head, neck, back, stomach, or other areas of your body.  Tight muscles or clenched jaw.  Low energy or trouble sleeping. Stress may cause unhealthy behaviors, including the following:   Eating to feel better (overeating) or skipping meals.  Sleeping too little, too much, or both.  Working too much or putting off tasks (procrastination).  Smoking, drinking alcohol, or using drugs to feel better. DIAGNOSIS  Stress is diagnosed through an assessment by your health care provider. Your health care provider will ask questions about your symptoms and any stressful life events.Your health care provider will also ask about your medical history and may order blood tests or other tests. Certain medical conditions and medicine can cause physical symptoms similar to stress. Mental illness can cause emotional symptoms and unhealthy behaviors similar to stress. Your health care provider may refer you to a mental health professional for further evaluation.  TREATMENT  Stress management is the recommended treatment for stress.The goals of stress management are reducing stressful life events  and coping with stress in healthy ways.  Techniques for reducing stressful life events include the following:  Stress identification. Self-monitor for stress and identify what causes stress for you. These skills may help you to avoid some stressful events.  Time management. Set your  priorities, keep a calendar of events, and learn to say "no." These tools can help you avoid making too many commitments. Techniques for coping with stress include the following:  Rethinking the problem. Try to think realistically about stressful events rather than ignoring them or overreacting. Try to find the positives in a stressful situation rather than focusing on the negatives.  Exercise. Physical exercise can release both physical and emotional tension. The key is to find a form of exercise you enjoy and do it regularly.  Relaxation techniques. These relax the body and mind. Examples include yoga, meditation, tai chi, biofeedback, deep breathing, progressive muscle relaxation, listening to music, being out in nature, journaling, and other hobbies. Again, the key is to find one or more that you enjoy and can do regularly.  Healthy lifestyle. Eat a balanced diet, get plenty of sleep, and do not smoke. Avoid using alcohol or drugs to relax.  Strong support network. Spend time with family, friends, or other people you enjoy being around.Express your feelings and talk things over with someone you trust. Counseling or talktherapy with a mental health professional may be helpful if you are having difficulty managing stress on your own. Medicine is typically not recommended for the treatment of stress.Talk to your health care provider if you think you need medicine for symptoms of stress. HOME CARE INSTRUCTIONS  Keep all follow-up visits as directed by your health care provider.  Take all medicines as directed by your health care provider. SEEK MEDICAL CARE IF:  Your symptoms get worse or you start having new symptoms.  You feel overwhelmed by your problems and can no longer manage them on your own. SEEK IMMEDIATE MEDICAL CARE IF:  You feel like hurting yourself or someone else. Document Released: 05/03/2001 Document Revised: 03/24/2014 Document Reviewed: 07/02/2013 [facility] Patient  Information 2015 [place], [place]. This information is not intended to replace advice given to you by your health care provider. Make sure you discuss any questions you have with your health care provider.

## 2014-10-08 NOTE — Progress Notes (Signed)
Lauren [name] 33 y.o.female presents for follow up for medication check for Brintellix 10mg  for anxiety and depression.  She states that she is extremely tired and does not feel like doing anything.  She states she "feels numb", "feels overwhelmed" and that she feels like she wants to cry but cannot.  She went to her sister's wedding recently and was able to cry there and states she never had a good relationship with her sister.  She reports extreme hunger and "binge eating like she has no control".    She states her husband and two girls have noticed a little difference.  She states her girls seem like they always miss her and want to snuggle all the time, but she just wants alone time.   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   She has started to see her counselor Selena Batten(Kim) at [facility].  She states that it is going all right. She denies SI and HI thoughts and plans.   She reports that she "bit through her night guard due to being stressed out and the dentist said they have never seen that before".  She reports nausea in the morning before eating breakfast.  Her lat LMP was September 23, 2014 and states her periods are normally regular but this last one was early.    She is currently taking Synthroid 100mcg (Take 1 tablet by mouth daily for 5 days, and then take 1/2 tablet by mouth daily for 2 days)  GFR = >89 on 09/17/14  Family HX- Mother- Bipolar        1/2 Brother- Bipolar  Past Medical History  Diagnosis Date  . Depression   . Hypothyroidism   . HSV-1 infection   . Fibromyalgia   . Preeclampsia   . Hypertension   . Anxiety   . Hemorrhoids   . Constipation   . GERD (gastroesophageal reflux disease)   . Other abnormal glucose   . Anemia     Allergies  Allergen Reactions  . Doxycycline Other (See Comments)    GI UPSET  . Effexor [Venlafaxine] Other (See Comments)     DYSP         DYSPHORIA  . Prednisone Other (See Comments)    FLUSHED  . Savella [Milnacipran Hcl] Other (See Comments)    NO RELIEF    Current Outpatient Prescriptions on File Prior to Visit  Medication Sig Dispense Refill  . ALPRAZolam (XANAX) 1 MG tablet Take 1 tablet (1 mg total) by mouth 2 (two) times daily as needed for sleep. 60 tablet 1  . Cholecalciferol (VITAMIN D) 2000 UNITS CAPS Take 8,000 Units by mouth daily.     Marland Kitchen. HYDROcodone-acetaminophen (NORCO) 5-325 MG per tablet Take 1 tablet by mouth every 6 (six) hours as needed for moderate pain. 60 tablet 0  . ibuprofen (ADVIL,MOTRIN) 200 MG tablet Take 200 mg by mouth every 6 (six) hours as needed for pain. Takes 4 tablets (800 mg) every 4-6 hrs prn    . levothyroxine (SYNTHROID) 100 MCG tablet Take 1 to 1&1/2 tablets daily as directed for Thyroid 145 tablet 0  . magnesium 30 MG tablet Take 250 mg by mouth 1 day or 1 dose.    . metroNIDAZOLE (METROGEL VAGINAL) 0.75 % vaginal gel Place 1 Applicatorful vaginally at bedtime. For 5 days 70 g 2  . polyethylene glycol (MIRALAX / GLYCOLAX)  packet Take 17 g by mouth daily.    . Vortioxetine HBr (BRINTELLIX) 10 MG TABS Take 1 tablet (10 mg total) by mouth daily. Gave pt one month supply of Brintellix 10mg .  Samples put up front on 09/22/14. 28 tablet 0   No current facility-administered medications on file prior to visit.    Review of Systems: [X]  = complains of  [ ]  = denies General: Fatigue [ X] Fever [ ]  Chills [ ]  Weakness [ ]   Insomnia [ ]  Eyes: Redness [ ]  Blurred vision [ ]  Diplopia [ ]   ENT: Congestion [ ]  Sinus Pain [ ]  Post Nasal Drip [ ]  Sore Throat [ ]  Earache [ ]   Cardiac: Chest pain/pressure [ ]  SOB [ ]  Orthopnea [ ]   Palpitations [ ]   Paroxysmal nocturnal dyspnea[ ]  Claudication [ ]  Edema [ ]   Pulmonary: Cough [ ]  Wheezing[ ]   SOB [ ]   Snoring [ ]   GI: Nausea [ ]  Vomiting[ ]  Dysphagia[ ]  Heartburn[ ]  Abdominal pain [ ]  Constipation [ ] ; Diarrhea [ ] ; BRBPR [ ]  Melena[  ] GU: Hematuria[ ]  Dysuria [ ]  Nocturia[ ]  Urgency [ ]   Hesitancy [ ]  Discharge [ ]  Neuro: Headaches[ ]  Vertigo[ ]  Paresthesias[ ]  Spasm [ ]  Speech changes [ ]  Incoordination [ ]   Ortho: Arthritis [ ]  Joint pain [ ]  Muscle pain [ ]  Joint swelling [ ]  Back Pain [ ]  Skin:  Rash [ ]   Pruritis [ ]  Change in skin lesion [ ]   Psych: Depression[X ] Anxiety[X ] Confusion [ ]  Memory loss [ ]   Heme/Lypmh: Bleeding [ ]  Bruising [ ]  Enlarged lymph nodes [ ]   Endocrine: Visual blurring [ ]  Paresthesia [ ]  Polyuria [ ]  Polydypsea [ ]    Heat/cold intolerance [ ]  Hypoglycemia [ ]  Physical Exam: Wt Readings from Last 3 Encounters:  10/08/14 156 lb 6.4 oz (70.943 kg)  09/17/14 152 lb (68.947 kg)  07/14/14 153 lb (69.4 kg)   BP 128/80 mmHg  Pulse 78  Temp(Src) 98.6 F (37 C)  Resp 18  Wt 156 lb 6.4 oz (70.943 kg)  SpO2 98%  LMP 09/23/2014 General Appearance: Well nourished, in no apparent distress, had pleasant demeanor but seemed withdrawn and had flat affect.   Eyes:  PERRLA. EOMI. Conjunctiva is pink without swelling, redness or yellowing.   Neck:  Full range of motion in neck intact Respiratory: Chest wall expansion symmetrical.  CTAB,  r/r/w, No stridor.  No increased effort of breathing. Cardio: RRR.   m/r/g.  S1S2nl.  PMI non-displaced.  Extremities:  C/C/E in upper and lower extremities.    Pulses B/L +2 in radial bilaterally. Skin: Warm, dry without rashes, lesions, ecchymosis, yellowing, cyanosis.   Skin has good turgor.   Neuro: Alert and oriented X4, cooperative.   Psych: Insight and Judgment appropriate.    Assessment and Plan: 1. Depression/Anxiety- Possible Bipolar? -Please contact Dr. Milagros Evener at [phone] so that she can help manage your depression and anxiety better.  -Continue the Brintellix 10mg  daily for depression. - Start taking the xanax 0.25mg  BID for anxiety during the day- ALPRAZolam (XANAX) 0.25 MG tablet; Take 1 tablet (0.25 mg total) by mouth 2 (two)  times daily. MDD: 2 tablets per day  Dispense: 60 tablet; Refill: 0 -Continue Xanax 1mg  at bedtime for sleep. -Continue counseling.   2. Nausea without vomiting -Take Zofran in the morning for nausea- ondansetron (ZOFRAN) 4 MG tablet; Take 1 tablet (4 mg total) by mouth every 8 (  eight) hours as needed for nausea.  Dispense: 20 tablet; Refill: 0  Discussed medication effects and SE's.  Pt agreed to treatment plan. Please follow up in 3 weeks for labs only (TSH) and for your physical in April 2016. If you have any questions, then please call the office.   Rafiq Bucklin, Lise AuerJennifer L, PA-C 4:33 PM [facility]

## 2014-10-28 ENCOUNTER — Telehealth: Payer: Self-pay | Admitting: *Deleted

## 2014-10-28 DIAGNOSIS — F329 Major depressive disorder, single episode, unspecified: Secondary | ICD-10-CM

## 2014-10-28 DIAGNOSIS — F32A Depression, unspecified: Secondary | ICD-10-CM

## 2014-10-28 MED ORDER — VORTIOXETINE HBR 10 MG PO TABS: 10.0000 mg | ORAL_TABLET | Freq: Every day | ORAL | Status: DC

## 2014-10-28 NOTE — Telephone Encounter (Signed)
Patient called requesting samples of Brintellix.  #28 pills placed up front for patient to pick up.  Also reminded patient to call Dr. Evelene CroonKaur or the [facility].  Patient states she is still currently seeing her counselor and is feeling much better.

## 2014-11-05 ENCOUNTER — Ambulatory Visit: Payer: Self-pay | Admitting: Physician Assistant

## 2014-11-05 ENCOUNTER — Other Ambulatory Visit: Payer: BC Managed Care – PPO

## 2014-11-05 DIAGNOSIS — E039 Hypothyroidism, unspecified: Secondary | ICD-10-CM

## 2014-11-06 LAB — TSH: TSH: 2.128 u[IU]/mL (ref 0.350–4.500)

## 2014-11-18 ENCOUNTER — Other Ambulatory Visit: Payer: Self-pay | Admitting: *Deleted

## 2014-11-18 DIAGNOSIS — F329 Major depressive disorder, single episode, unspecified: Secondary | ICD-10-CM

## 2014-11-18 DIAGNOSIS — F32A Depression, unspecified: Secondary | ICD-10-CM

## 2014-11-18 MED ORDER — VORTIOXETINE HBR 10 MG PO TABS: 10.0000 mg | ORAL_TABLET | Freq: Every day | ORAL | Status: DC

## 2014-11-18 NOTE — Telephone Encounter (Signed)
Patient called requesting samples of Brintellix 10 mg.  #28 placed up front for patient to pick up.  Also advised patient, once again, to call [facility] to schedule ov to help manage depression/anxiety symptoms.

## 2014-11-28 ENCOUNTER — Telehealth: Payer: Self-pay | Admitting: *Deleted

## 2014-11-28 NOTE — Telephone Encounter (Signed)
Patient called stating she lost the info about the [facility] that we provided for her.  It was recommended that she sees the [facility] to help manage her depression, anxiety and stress better. Info was provided to patient for her to call and schedule an appointment.   Address: [address]     [place], [place] [address] Phone-[phone]

## 2014-12-01 ENCOUNTER — Encounter: Payer: Self-pay | Admitting: Physician Assistant

## 2014-12-01 ENCOUNTER — Ambulatory Visit (HOSPITAL_COMMUNITY)
Admission: RE | Admit: 2014-12-01 | Discharge: 2014-12-01 | Disposition: A | Discharge status: Home / Self Care | Payer: BC Managed Care – PPO | Source: Ambulatory Visit | Attending: Physician Assistant | Admitting: Physician Assistant

## 2014-12-01 ENCOUNTER — Ambulatory Visit (INDEPENDENT_AMBULATORY_CARE_PROVIDER_SITE_OTHER): Payer: BC Managed Care – PPO | Admitting: Physician Assistant

## 2014-12-01 VITALS — BP 118/86 | HR 82 | Temp 98.6°F | Resp 16 | Ht 61.0 in | Wt 163.0 lb

## 2014-12-01 DIAGNOSIS — M5441 Lumbago with sciatica, right side: Secondary | ICD-10-CM

## 2014-12-01 DIAGNOSIS — J029 Acute pharyngitis, unspecified: Secondary | ICD-10-CM

## 2014-12-01 MED ORDER — CELECOXIB 200 MG PO CAPS: 200.0000 mg | ORAL_CAPSULE | Freq: Two times a day (BID) | ORAL | Status: DC

## 2014-12-01 MED ORDER — AZITHROMYCIN 250 MG PO TABS: ORAL_TABLET | ORAL | Status: AC

## 2014-12-01 MED ORDER — CELECOXIB 200 MG PO CAPS: 200.0000 mg | ORAL_CAPSULE | Freq: Every day | ORAL | Status: DC

## 2014-12-01 NOTE — Patient Instructions (Addendum)
-Please take the Celebrex every day for back pain -Please do NOT take Ibuprofen, advil, aleve or naproxen over the counter while on this medication. -Please take Z-Pak as prescribed for pharyngitis. [facility]  I will call you with results.  Please drink water to stay hydrated.    Mardella LaymanLindsey will call you with date and time for appt with [facility].   Sciatica Sciatica is pain, weakness, numbness, or tingling along the path of the sciatic nerve. The nerve starts in the lower back and runs down the back of each leg. The nerve controls the muscles in the lower leg and in the back of the knee, while also providing sensation to the back of the thigh, lower leg, and the sole of your foot. Sciatica is a symptom of another medical condition. For instance, nerve damage or certain conditions, such as a herniated disk or bone spur on the spine, pinch or put pressure on the sciatic nerve. This causes the pain, weakness, or other sensations normally associated with sciatica. Generally, sciatica only affects one side of the body. CAUSES   Herniated or slipped disc.  Degenerative disk disease.  A pain disorder involving the narrow muscle in the buttocks (piriformis syndrome).  Pelvic injury or fracture.  Pregnancy.  Tumor (rare). SYMPTOMS  Symptoms can vary from mild to very severe. The symptoms usually travel from the low back to the buttocks and down the back of the leg. Symptoms can include:  Mild tingling or dull aches in the lower back, leg, or hip.  Numbness in the back of the calf or sole of the foot.  Burning sensations in the lower back, leg, or hip.  Sharp pains in the lower back, leg, or hip.  Leg weakness.  Severe back pain inhibiting movement. These symptoms may get worse with coughing, sneezing, laughing, or prolonged sitting or standing. Also, being overweight may worsen symptoms. DIAGNOSIS  Your caregiver will perform a physical exam to  look for common symptoms of sciatica. He or she may ask you to do certain movements or activities that would trigger sciatic nerve pain. Other tests may be performed to find the cause of the sciatica. These may include:  Blood tests.  X-rays.  Imaging tests, such as an MRI or CT scan. TREATMENT  Treatment is directed at the cause of the sciatic pain. Sometimes, treatment is not necessary and the pain and discomfort goes away on its own. If treatment is needed, your caregiver may suggest:  Over-the-counter medicines to relieve pain.  Prescription medicines, such as anti-inflammatory medicine, muscle relaxants, or narcotics.  Applying heat or ice to the painful area.  Steroid injections to lessen pain, irritation, and inflammation around the nerve.  Reducing activity during periods of pain.  Exercising and stretching to strengthen your abdomen and improve flexibility of your spine. Your caregiver may suggest losing weight if the extra weight makes the back pain worse.  Physical therapy.  Surgery to eliminate what is pressing or pinching the nerve, such as a bone spur or part of a herniated disk. HOME CARE INSTRUCTIONS   Only take over-the-counter or prescription medicines for pain or discomfort as directed by your caregiver.  Apply ice to the affected area for 20 minutes, 3-4 times a day for the first 48-72 hours. Then try heat in the same way.  Exercise, stretch, or perform your usual activities if these do not aggravate your pain.  Attend physical therapy sessions as directed by your  caregiver.  Keep all follow-up appointments as directed by your caregiver.  Do not wear high heels or shoes that do not provide proper support.  Check your mattress to see if it is too soft. A firm mattress may lessen your pain and discomfort. SEEK IMMEDIATE MEDICAL CARE IF:   You lose control of your bowel or bladder (incontinence).  You have increasing weakness in the lower back, pelvis,  buttocks, or legs.  You have redness or swelling of your back.  You have a burning sensation when you urinate.  You have pain that gets worse when you lie down or awakens you at night.  Your pain is worse than you have experienced in the past.  Your pain is lasting longer than 4 weeks.  You are suddenly losing weight without reason. MAKE SURE YOU:  Understand these instructions.  Will watch your condition.  Will get help right away if you are not doing well or get worse. Document Released: 11/01/2001 Document Revised: 05/08/2012 Document Reviewed: 03/18/2012 [facility] Patient Information 2015 [place], [place]. This information is not intended to replace advice given to you by your health care provider. Make sure you discuss any questions you have with your health care provider.

## 2014-12-01 NOTE — Progress Notes (Addendum)
HPI A Caucasian 34yo female presents to the office today due to recurrent back pain that started August 2015.  Patient states the current episode of back pain started 3 days ago and has been constant.  Before that the back pain would come and go.  She denies any specific injury.  The pain is located in the right low back and radiates to her right foot.  She describes the pain as stabbing and is a 5 out of 10 right now.  She states the pain is aggravated by lying down and that nothing alleviates the pain.  She states that lying down at night and then getting up in the morning she does not have any pain at that time, but then the pain starts again.  She states she tried left over Tramadol and Norco and they do not help the pain.  She reports numbness and tingling.  She denies headaches, dizziness, decreased ROM, weakness, stiffness, rash and urinary/bowel incontinence.   She is also reporting a sore throat, congestion and rhinorrhea that started a couple days ago.  She states it is starting to feel like infection and would like it looked at.  She denies fever, chills, sweats, fatigue, ear pain, sinus pressure, post-nasal drip, cough, SOB, CP, abdominal pain, nausea, vomiting, diarrhea and constipation.  Review of Systems  All other systems reviewed and are negative.  Current Medications: Current Outpatient Prescriptions on File Prior to Visit  Medication Sig Dispense Refill  . ALPRAZolam (XANAX) 0.25 MG tablet Take 1 tablet (0.25 mg total) by mouth 2 (two) times daily. MDD: 2 tablets per day 60 tablet 0  . ALPRAZolam (XANAX) 1 MG tablet Take 1 tablet (1 mg total) by mouth 2 (two) times daily as needed for sleep. 60 tablet 1  . Cholecalciferol (VITAMIN D) 2000 UNITS CAPS Take 8,000 Units by mouth daily.     Marland Kitchen HYDROcodone-acetaminophen (NORCO) 5-325 MG per tablet Take 1 tablet by mouth every 6 (six) hours as needed for moderate pain. 60 tablet 0  . ibuprofen (ADVIL,MOTRIN) 200 MG tablet Take 200 mg by  mouth every 6 (six) hours as needed for pain. Takes 4 tablets (800 mg) every 4-6 hrs prn    . levothyroxine (SYNTHROID) 100 MCG tablet Take 1 to 1&1/2 tablets daily as directed for Thyroid 145 tablet 0  . magnesium 30 MG tablet Take 250 mg by mouth 1 day or 1 dose.    . ondansetron (ZOFRAN) 4 MG tablet Take 1 tablet (4 mg total) by mouth every 8 (eight) hours as needed for nausea. 20 tablet 0  . polyethylene glycol (MIRALAX / GLYCOLAX) packet Take 17 g by mouth daily.    . Vortioxetine HBr (BRINTELLIX) 10 MG TABS Take 1 tablet (10 mg total) by mouth daily. Gave pt one month supply of Brintellix .  Samples put up front. 28 tablet 0   No current facility-administered medications on file prior to visit.   Allergies:  Allergies  Allergen Reactions  . Doxycycline Other (See Comments)    GI UPSET  . Effexor [Venlafaxine] Other (See Comments)    DYSP         DYSPHORIA  . Prednisone Other (See Comments)    FLUSHED  . Savella [Milnacipran Hcl] Other (See Comments)    NO RELIEF   Medical History:  Past Medical History  Diagnosis Date  . Depression   . Hypothyroidism   . HSV-1 infection   . Fibromyalgia   . Preeclampsia   . Hypertension   .  Anxiety   . Hemorrhoids   . Constipation   . GERD (gastroesophageal reflux disease)   . Other abnormal glucose   . Anemia    Surgical History:  Past Surgical History  Procedure Laterality Date  . Cholecystectomy  2006  . Tonsillectomy    . Wisdom tooth extraction     Social History:   History  Substance Use Topics  . Smoking status: Never Smoker   . Smokeless tobacco: Never Used  . Alcohol Use: Yes     Comment: occ   Physical Exam: Wt Readings from Last 3 Encounters:  12/01/14 163 lb (73.936 kg)  10/08/14 156 lb 6.4 oz (70.943 kg)  09/17/14 152 lb (68.947 kg)   BP 118/86 mmHg  Pulse 82  Temp(Src) 98.6 F (37 C) (Temporal)  Resp 16  Ht 5\' 1"  (1.549 m)  Wt 163 lb (73.936 kg)  BMI 30.81 kg/m2  SpO2 98%  LMP  11/21/2014  Vitals Reviewed. General Appearance: Well nourished, in no apparent distress and had pleasant demeanor. Obese. Eyes:  PERRLA. EOMI. Conjunctiva is pink without edema, erythema or yellowing.  No scleral icterus. Sinuses: No Frontal/maxillary tenderness. Ears: No erythema, edema or tenderness on both external ear cartilages and ear canals.  TMs are intact bilaterally with normal light reflexes and without erythema, edema or bulging. Nose: Nose is symmetrical and turbinates are erythematous and edematous bilaterally.    Mild left nostril blood present and rhinorrhea present.  Throat: Oral pharynx is pink and moist.  Erythematous posterior pharynx.  Mucosa is intact and without lesions.  Uvula is midline and not swollen. Neck: Full ROM intact. No tenderness or LAD. Respiratory: Chest wall expansion symmetrical.  CTAB,  r/r/w or stridor. No increased effort of breathing. Cardio: RRR.   m/r/g.  S1S2nl.   Musculoskeletal and Neuro:  Patient is able to ambulate well.   Gait is not antalgic. Straight leg raising with dorsiflexion positive on the right and negative on left for radicular symptoms. Sensory exam in the arms are normal and legs are abnormal  Decreased on the right leg compared to left leg in the L4-S1 dermatome..  Bicep reflexes are normal Brachioradialis reflexes are normal Knee reflexes are normal Ankle reflexes are normal Strength is normal and symmetric in arms.  Strength is slightly decreased in right leg compared to left leg. There is not paraspinal muscle spasm.   There is not midline tenderness.   ROM of spine with normal flexion, extension, lateral range of motion to the right and left, and rotation to the right and left.   No kyphosis, lordosis or scoliosis. Able to walk on tippy toes without pain.  Has pain while walking on heels.  Has pain while walking one foot in front of other.  Trendelenburg: negative Clonus:negative Babinski: negative Faber Test-  Positive on the right only.  Some pain on left during test.  Assessment and Plan 1. Bilateral low back pain with right-sided sciatica - Ambulatory referral to Orthopedic Surgery- [facility] - Take Celebrex as prescribed for inflammation- celecoxib (CELEBREX) 200 MG capsule; Take 1 capsule (200 mg total) by mouth QDaily.  Dispense: 60 capsule; Refill: 1 Order x-rays to R/O herniated disc or structure abnormality.  - DG Lumbar Spine Complete; Future - DG HIPS BILAT WITH PELVIS 3-4 VIEWS; Future  2. Acute pharyngitis, unspecified pharyngitis type -Take Z-Pak as prescribed- azithromycin (ZITHROMAX) 250 MG tablet; Take 2 tablets PO on day 1, then 1 tablet PO Q24H x 4 days  Dispense: 6 tablet;  Refill: 0  Discussed medication effects and SE's.  Pt agreed to treatment plan. If you are not feeling better in 10-14 days, then please call the office. Please keep your physical appt on 02/24/15.  Hawa Henly, Lise Auer, PA-C 2:20 PM  [facility]

## 2014-12-10 ENCOUNTER — Other Ambulatory Visit (HOSPITAL_COMMUNITY)
Admission: RE | Admit: 2014-12-10 | Discharge: 2014-12-10 | Disposition: A | Discharge status: Home / Self Care | Payer: BC Managed Care – PPO | Source: Ambulatory Visit | Attending: Obstetrics and Gynecology | Admitting: Obstetrics and Gynecology

## 2014-12-10 ENCOUNTER — Ambulatory Visit (INDEPENDENT_AMBULATORY_CARE_PROVIDER_SITE_OTHER): Payer: BC Managed Care – PPO | Admitting: Obstetrics and Gynecology

## 2014-12-10 ENCOUNTER — Encounter: Payer: Self-pay | Admitting: Obstetrics and Gynecology

## 2014-12-10 VITALS — BP 120/70 | Ht 61.0 in | Wt 163.0 lb

## 2014-12-10 DIAGNOSIS — Z01419 Encounter for gynecological examination (general) (routine) without abnormal findings: Secondary | ICD-10-CM | POA: Insufficient documentation

## 2014-12-10 DIAGNOSIS — Z1151 Encounter for screening for human papillomavirus (HPV): Secondary | ICD-10-CM | POA: Insufficient documentation

## 2014-12-10 NOTE — Progress Notes (Signed)
Assessment:  Annual Gyn Exam   Plan:  1. pap smear done, next pap due 3 years 2. return annually or prn 3    Annual mammogram advised Subjective:  Lauren [name] is a 34 y.o. female G1P1 who presents for annual exam. Patient's last menstrual period was 11/22/2014. She reports that she has not had any issues with any of her recent pap smears. She reports that her last pap smear was Feb. 2014. She denies any problems with her periods at this time. She reports that she didn't breastfeed her child. She notes that she has to wear a pad all the time because she will experience urinary incontinence while laughing.   The following portions of the patient's history were reviewed and updated as appropriate: allergies, current medications, past family history, past medical history, past social history, past surgical history and problem list. Past Medical History  Diagnosis Date  . Depression   . Hypothyroidism   . HSV-1 infection   . Fibromyalgia   . Preeclampsia   . Hypertension   . Anxiety   . Hemorrhoids   . Constipation   . GERD (gastroesophageal reflux disease)   . Other abnormal glucose   . Anemia     Past Surgical History  Procedure Laterality Date  . Cholecystectomy  2006  . Tonsillectomy    . Wisdom tooth extraction       Current outpatient prescriptions:  .  ALPRAZolam (XANAX) 0.25 MG tablet, Take 1 tablet (0.25 mg total) by mouth 2 (two) times daily. MDD: 2 tablets per day, Disp: 60 tablet, Rfl: 0 .  Cholecalciferol (VITAMIN D) 2000 UNITS CAPS, Take 8,000 Units by mouth daily. , Disp: , Rfl:  .  cyclobenzaprine (FLEXERIL) 10 MG tablet, , Disp: , Rfl:  .  HYDROcodone-acetaminophen (NORCO) 5-325 MG per tablet, Take 1 tablet by mouth every 6 (six) hours as needed for moderate pain., Disp: 60 tablet, Rfl: 0 .  ibuprofen (ADVIL,MOTRIN) 200 MG tablet, Take 200 mg by mouth every 6 (six) hours as needed for pain. Takes 4 tablets (800 mg) every 4-6 hrs prn, Disp: , Rfl:  .   levothyroxine (SYNTHROID) 100 MCG tablet, Take 1 to 1&1/2 tablets daily as directed for Thyroid, Disp: 145 tablet, Rfl: 0 .  magnesium 30 MG tablet, Take 250 mg by mouth 1 day or 1 dose., Disp: , Rfl:  .  metroNIDAZOLE (METROGEL) 0.75 % vaginal gel, , Disp: , Rfl: 2 .  polyethylene glycol (MIRALAX / GLYCOLAX) packet, Take 17 g by mouth daily., Disp: , Rfl:  .  traMADol (ULTRAM) 50 MG tablet, Take 50 mg by mouth every 6 (six) hours as needed., Disp: , Rfl:  .  Vortioxetine HBr (BRINTELLIX) 10 MG TABS, Take 1 tablet (10 mg total) by mouth daily. Gave pt one month supply of Brintellix .  Samples put up front., Disp: 28 tablet, Rfl: 0 .  celecoxib (CELEBREX) 200 MG capsule, Take 1 capsule (200 mg total) by mouth daily. (Patient not taking: Reported on 12/10/2014), Disp: 30 capsule, Rfl: 1  Review of Systems Constitutional: negative Gastrointestinal: negative Genitourinary: negative  Objective:  BP 120/70 mmHg  Ht  (1.549 m)  Wt 163 lb (73.936 kg)  BMI 30.81 kg/m2  LMP 11/22/2014   BMI: Body mass index is 30.81 kg/(m^2).  General Appearance: Alert, appropriate appearance for age. No acute distress HEENT: Grossly normal Neck / Thyroid:  Cardiovascular: RRR; normal S1, S2, no murmur Lungs: CTA bilaterally Back: No CVAT Breast Exam: Normal  to inspection, Normal breast tissue bilaterally and No masses or nodes.No dimpling, nipple retraction or discharge. Gastrointestinal: Soft, non-tender, no masses or organomegaly Pelvic Exam: Vulva and vagina appear normal. Bimanual exam reveals normal uterus and adnexa. Vaginal: normal mucosa without prolapse or lesions, normal without tenderness, induration or masses and normal rugae Cervix: normal appearance and well supported Adnexa: normal bimanual exam Uterus: normal single, nontender Rectovaginal: not indicated Lymphatic Exam: Non-palpable nodes in neck, clavicular, axillary, or inguinal regions  Skin: no rash or abnormalities Neurologic:  Normal gait and speech, no tremor  Psychiatric: Alert and oriented, appropriate affect.  Urinalysis:Not done  Assessment:  1. Pap and Physical completed in office  Plan:  1. F/U q 3 years for pap  [name]. MD Pgr [phone] 3:23 PM    This chart was scribed for Lauren BurrowJohn Bronx Brogden V, MD by Chestine SporeSoijett Blue, ED Scribe. The patient was seen in room 1 at 3:23 PM.

## 2014-12-10 NOTE — Progress Notes (Signed)
Patient ID: Lauren PettiesMandy E [name], female   DOB: 06/14/1981, 34 y.o.   MRN: [id number] Pt here today for annual exam. Pt states that she has a little bump on her vagina.

## 2014-12-12 LAB — CYTOLOGY - PAP

## 2014-12-24 ENCOUNTER — Other Ambulatory Visit: Payer: Self-pay | Admitting: *Deleted

## 2014-12-24 DIAGNOSIS — F32A Depression, unspecified: Secondary | ICD-10-CM

## 2014-12-24 DIAGNOSIS — F329 Major depressive disorder, single episode, unspecified: Secondary | ICD-10-CM

## 2014-12-24 MED ORDER — VORTIOXETINE HBR 10 MG PO TABS: 10.0000 mg | ORAL_TABLET | Freq: Every day | ORAL | Status: DC

## 2015-01-08 ENCOUNTER — Other Ambulatory Visit: Payer: Self-pay | Admitting: *Deleted

## 2015-01-08 DIAGNOSIS — F329 Major depressive disorder, single episode, unspecified: Secondary | ICD-10-CM

## 2015-01-08 DIAGNOSIS — F32A Depression, unspecified: Secondary | ICD-10-CM

## 2015-01-08 MED ORDER — VORTIOXETINE HBR 10 MG PO TABS: 10.0000 mg | ORAL_TABLET | Freq: Every day | ORAL | Status: DC

## 2015-01-26 ENCOUNTER — Other Ambulatory Visit: Payer: Self-pay | Admitting: *Deleted

## 2015-01-26 DIAGNOSIS — F329 Major depressive disorder, single episode, unspecified: Secondary | ICD-10-CM

## 2015-01-26 DIAGNOSIS — F32A Depression, unspecified: Secondary | ICD-10-CM

## 2015-01-26 MED ORDER — VORTIOXETINE HBR 10 MG PO TABS: 10.0000 mg | ORAL_TABLET | Freq: Every day | ORAL | Status: DC

## 2015-01-28 ENCOUNTER — Encounter: Payer: Self-pay | Admitting: Internal Medicine

## 2015-01-28 ENCOUNTER — Ambulatory Visit (INDEPENDENT_AMBULATORY_CARE_PROVIDER_SITE_OTHER): Payer: BC Managed Care – PPO | Admitting: Internal Medicine

## 2015-01-28 VITALS — BP 118/70 | HR 72 | Temp 98.4°F | Resp 16 | Ht 61.0 in | Wt 164.0 lb

## 2015-01-28 DIAGNOSIS — J309 Allergic rhinitis, unspecified: Secondary | ICD-10-CM

## 2015-01-28 DIAGNOSIS — R350 Frequency of micturition: Secondary | ICD-10-CM

## 2015-01-28 DIAGNOSIS — F32A Depression, unspecified: Secondary | ICD-10-CM

## 2015-01-28 DIAGNOSIS — R5383 Other fatigue: Secondary | ICD-10-CM

## 2015-01-28 DIAGNOSIS — F329 Major depressive disorder, single episode, unspecified: Secondary | ICD-10-CM

## 2015-01-28 DIAGNOSIS — E039 Hypothyroidism, unspecified: Secondary | ICD-10-CM

## 2015-01-28 DIAGNOSIS — E559 Vitamin D deficiency, unspecified: Secondary | ICD-10-CM

## 2015-01-28 LAB — CBC WITH DIFFERENTIAL/PLATELET
Basophils Absolute: 0 10*3/uL (ref 0.0–0.1)
Basophils Relative: 0 % (ref 0–1)
EOS ABS: 0.1 10*3/uL (ref 0.0–0.7)
EOS PCT: 1 % (ref 0–5)
HEMATOCRIT: 39 % (ref 36.0–46.0)
HEMOGLOBIN: 13 g/dL (ref 12.0–15.0)
Lymphocytes Relative: 38 % (ref 12–46)
Lymphs Abs: 3 10*3/uL (ref 0.7–4.0)
MCH: 28.7 pg (ref 26.0–34.0)
MCHC: 33.3 g/dL (ref 30.0–36.0)
MCV: 86.1 fL (ref 78.0–100.0)
MONO ABS: 0.6 10*3/uL (ref 0.1–1.0)
MONOS PCT: 7 % (ref 3–12)
MPV: 10.9 fL (ref 8.6–12.4)
NEUTROS ABS: 4.3 10*3/uL (ref 1.7–7.7)
NEUTROS PCT: 54 % (ref 43–77)
Platelets: 183 10*3/uL (ref 150–400)
RBC: 4.53 MIL/uL (ref 3.87–5.11)
RDW: 13.8 % (ref 11.5–15.5)
WBC: 8 10*3/uL (ref 4.0–10.5)

## 2015-01-28 LAB — IRON AND TIBC
%SAT: 9 % — AB (ref 20–55)
Iron: 32 ug/dL — ABNORMAL LOW (ref 42–145)
TIBC: 369 ug/dL (ref 250–470)
UIBC: 337 ug/dL (ref 125–400)

## 2015-01-28 LAB — BASIC METABOLIC PANEL WITH GFR
BUN: 11 mg/dL (ref 6–23)
CALCIUM: 9 mg/dL (ref 8.4–10.5)
CO2: 25 mEq/L (ref 19–32)
CREATININE: 0.62 mg/dL (ref 0.50–1.10)
Chloride: 105 mEq/L (ref 96–112)
GFR, Est Non African American: >89 mL/min
GLUCOSE: 87 mg/dL (ref 70–99)
POTASSIUM: 4.2 meq/L (ref 3.5–5.3)
Sodium: 137 mEq/L (ref 135–145)

## 2015-01-28 MED ORDER — TRAZODONE HCL 50 MG PO TABS: 50.0000 mg | ORAL_TABLET | Freq: Every day | ORAL | Status: DC

## 2015-01-28 NOTE — Patient Instructions (Signed)
Fatigue Fatigue is a feeling of tiredness, lack of energy, lack of motivation, or feeling tired all the time. Having enough rest, good nutrition, and reducing stress will normally reduce fatigue. Consult your caregiver if it persists. The nature of your fatigue will help your caregiver to find out its cause. The treatment is based on the cause.  CAUSES  There are many causes for fatigue. Most of the time, fatigue can be traced to one or more of your habits or routines. Most causes fit into one or more of three general areas. They are: Lifestyle problems  Sleep disturbances.  Overwork.  Physical exertion.  Unhealthy habits.  Poor eating habits or eating disorders.  Alcohol and/or drug use .  Lack of proper nutrition (malnutrition). Psychological problems  Stress and/or anxiety problems.  Depression.  Grief.  Boredom. Medical Problems or Conditions  Anemia.  Pregnancy.  Thyroid gland problems.  Recovery from major surgery.  Continuous pain.  Emphysema or asthma that is not well controlled  Allergic conditions.  Diabetes.  Infections (such as mononucleosis).  Obesity.  Sleep disorders, such as sleep apnea.  Heart failure or other heart-related problems.  Cancer.  Kidney disease.  Liver disease.  Effects of certain medicines such as antihistamines, cough and cold remedies, prescription pain medicines, heart and blood pressure medicines, drugs used for treatment of cancer, and some antidepressants. SYMPTOMS  The symptoms of fatigue include:   Lack of energy.  Lack of drive (motivation).  Drowsiness.  Feeling of indifference to the surroundings. DIAGNOSIS  The details of how you feel help guide your caregiver in finding out what is causing the fatigue. You will be asked about your present and past health condition. It is important to review all medicines that you take, including prescription and non-prescription items. A thorough exam will be done.  You will be questioned about your feelings, habits, and normal lifestyle. Your caregiver may suggest blood tests, urine tests, or other tests to look for common medical causes of fatigue.  TREATMENT  Fatigue is treated by correcting the underlying cause. For example, if you have continuous pain or depression, treating these causes will improve how you feel. Similarly, adjusting the dose of certain medicines will help in reducing fatigue.  HOME CARE INSTRUCTIONS   Try to get the required amount of good sleep every night.  Eat a healthy and nutritious diet, and drink enough water throughout the day.  Practice ways of relaxing (including yoga or meditation).  Exercise regularly.  Make plans to change situations that cause stress. Act on those plans so that stresses decrease over time. Keep your work and personal routine reasonable.  Avoid street drugs and minimize use of alcohol.  Start taking a daily multivitamin after consulting your caregiver. SEEK MEDICAL CARE IF:   You have persistent tiredness, which cannot be accounted for.  You have fever.  You have unintentional weight loss.  You have headaches.  You have disturbed sleep throughout the night.  You are feeling sad.  You have constipation.  You have dry skin.  You have gained weight.  You are taking any new or different medicines that you suspect are causing fatigue.  You are unable to sleep at night.  You develop any unusual swelling of your legs or other parts of your body. SEEK IMMEDIATE MEDICAL CARE IF:   You are feeling confused.  Your vision is blurred.  You feel faint or pass out.  You develop severe headache.  You develop severe abdominal, pelvic, or   back pain.  You develop chest pain, shortness of breath, or an irregular or fast heartbeat.  You are unable to pass a normal amount of urine.  You develop abnormal bleeding such as bleeding from the rectum or you vomit blood.  You have thoughts  about harming yourself or committing suicide.  You are worried that you might harm someone else. MAKE SURE YOU:   Understand these instructions.  Will watch your condition.  Will get help right away if you are not doing well or get worse. Document Released: 09/04/2007 Document Revised: 01/30/2012 Document Reviewed: 03/11/2014 [facility] Patient Information 2015 [place], [place]. This information is not intended to replace advice given to you by your health care provider. Make sure you discuss any questions you have with your health care provider.  Allergic Rhinitis Allergic rhinitis is when the mucous membranes in the nose respond to allergens. Allergens are particles in the air that cause your body to have an allergic reaction. This causes you to release allergic antibodies. Through a chain of events, these eventually cause you to release histamine into the blood stream. Although meant to protect the body, it is this release of histamine that causes your discomfort, such as frequent sneezing, congestion, and an itchy, runny nose.  CAUSES  Seasonal allergic rhinitis (hay fever) is caused by pollen allergens that may come from grasses, trees, and weeds. Year-round allergic rhinitis (perennial allergic rhinitis) is caused by allergens such as house dust mites, pet dander, and mold spores.  SYMPTOMS   Nasal stuffiness (congestion).  Itchy, runny nose with sneezing and tearing of the eyes. DIAGNOSIS  Your health care provider can help you determine the allergen or allergens that trigger your symptoms. If you and your health care provider are unable to determine the allergen, skin or blood testing may be used. TREATMENT  Allergic rhinitis does not have a cure, but it can be controlled by:  Medicines and allergy shots (immunotherapy).  Avoiding the allergen. Hay fever may often be treated with antihistamines in pill or nasal spray forms. Antihistamines block the effects of histamine. There are  over-the-counter medicines that may help with nasal congestion and swelling around the eyes. Check with your health care provider before taking or giving this medicine.  If avoiding the allergen or the medicine prescribed do not work, there are many new medicines your health care provider can prescribe. Stronger medicine may be used if initial measures are ineffective. Desensitizing injections can be used if medicine and avoidance does not work. Desensitization is when a patient is given ongoing shots until the body becomes less sensitive to the allergen. Make sure you follow up with your health care provider if problems continue. HOME CARE INSTRUCTIONS It is not possible to completely avoid allergens, but you can reduce your symptoms by taking steps to limit your exposure to them. It helps to know exactly what you are allergic to so that you can avoid your specific triggers. SEEK MEDICAL CARE IF:   You have a fever.  You develop a cough that does not stop easily (persistent).  You have shortness of breath.  You start wheezing.  Symptoms interfere with normal daily activities. Document Released: 08/02/2001 Document Revised: 11/12/2013 Document Reviewed: 07/15/2013 [facility] PartnershipExitCare Patient Information 2015 [place], [place]. This information is not intended to replace advice given to you by your health care provider. Make sure you discuss any questions you have with your health care provider.

## 2015-01-28 NOTE — Progress Notes (Signed)
Subjective:    Patient ID: Lauren [name], female    DOB: 03/19/1981, 34 y.o.   MRN: [id number]  HPI  Patient presents to the office for evaluation of fatigue. For the past week she has been very fatigued.  She has had some myalgias, headaches, subjective low grade fever of 99, and hot and cold chills.  She reports that she has been eating and drinking well.  She does not have an appetite.  She has lost some pleasure in doing things that she normally likes.  Sleeping a lot less than she used to.  She feels like she has a hard time staying asleep.  She reports waking up 2-3 times per evening.  Tried taking some mucinex to help her feel better.  She also tried taking melatonin.  She feels like that helps sometimes.    Review of Systems  Constitutional: Positive for fever, chills, activity change and fatigue. Negative for appetite change.  HENT: Positive for congestion, postnasal drip and rhinorrhea. Negative for sore throat and trouble swallowing.   Respiratory: Positive for cough. Negative for chest tightness, shortness of breath and wheezing.   Cardiovascular: Negative for chest pain and leg swelling.  Gastrointestinal: Negative for nausea, vomiting, abdominal pain, diarrhea and constipation.  Genitourinary: Negative.   Musculoskeletal: Positive for myalgias.  Skin: Negative.   Neurological: Positive for headaches.  Psychiatric/Behavioral: Positive for sleep disturbance, dysphoric mood and decreased concentration. The patient is not nervous/anxious.        Objective:   Physical Exam  Constitutional: She is oriented to person, place, and time. She appears well-developed and well-nourished. No distress.  HENT:  Head: Normocephalic and atraumatic.  Mouth/Throat: Oropharynx is clear and moist. No oropharyngeal exudate.  Eyes: Conjunctivae and EOM are normal. Pupils are equal, round, and reactive to light. No scleral icterus.  Neck: Normal range of motion. Neck supple. No JVD present. No  thyromegaly present.  Cardiovascular: Normal rate, regular rhythm, normal heart sounds and intact distal pulses.  Exam reveals no gallop and no friction rub.   No murmur heard. Pulmonary/Chest: Effort normal and breath sounds normal. No respiratory distress. She has no wheezes. She has no rales. She exhibits no tenderness.  Abdominal: Soft. Bowel sounds are normal. She exhibits no distension and no mass. There is no tenderness. There is no rebound and no guarding.  Musculoskeletal: Normal range of motion.  Lymphadenopathy:    She has no cervical adenopathy.  Neurological: She is alert and oriented to person, place, and time. She has normal strength. No cranial nerve deficit or sensory deficit. Coordination normal.  Skin: Skin is warm and dry. She is not diaphoretic.  Psychiatric: Her speech is normal. Judgment and thought content normal. She is slowed. Cognition and memory are normal. She exhibits a depressed mood.  Nursing note and vitals reviewed.   Filed Vitals:   01/28/15 1527  BP: 118/70  Pulse: 72  Temp: 98.4 F (36.9 C)  Resp: 16         Assessment & Plan:   Patient presents to the office for eval of fatigue and post nasal drip.  Suspect that fatigue is likely somatic symptom of depression which is uncontrolled.  Will r/o other causes for fatigue.  Will treat post nasal drip with antihistamine and nettipot vs. Saline. Will check urine at patients request due to frequent UTI history.  Patient to f/u with regular appointment with psych.  Patient due for appt with Melissa on 02/21/15 for CPE.  1. Other  fatigue  - CBC with Differential/Platelet - BASIC METABOLIC PANEL WITH GFR - Iron and TIBC - Vitamin B12  2. Allergic rhinitis, unspecified allergic rhinitis type -start daily OTC zyrtec or clariten  3. Hypothyroidism, unspecified hypothyroidism type -cont levothryoxine - TSH  4. Vitamin D deficiency -cont supp - Vit D  25 hydroxy (rtn osteoporosis monitoring)  5.  Urinary frequency -will wait on UA - Urinalysis, Routine w reflex microscopic  6. Depression -patient on Brintellix -patient scheduled with Psych on April 1st for medication adjustment.

## 2015-01-29 LAB — URINALYSIS, ROUTINE W REFLEX MICROSCOPIC
Bilirubin Urine: NEGATIVE
GLUCOSE, UA: NEGATIVE mg/dL
Hgb urine dipstick: NEGATIVE
KETONES UR: NEGATIVE mg/dL
Leukocytes, UA: NEGATIVE
Nitrite: NEGATIVE
Protein, ur: NEGATIVE mg/dL
SPECIFIC GRAVITY, URINE: 1.01 (ref 1.005–1.030)
UROBILINOGEN UA: 0.2 mg/dL (ref 0.0–1.0)
pH: 6.5 (ref 5.0–8.0)

## 2015-01-29 LAB — VITAMIN B12: Vitamin B-12: 562 pg/mL (ref 211–911)

## 2015-01-29 LAB — TSH: TSH: 3.881 u[IU]/mL (ref 0.350–4.500)

## 2015-01-29 LAB — VITAMIN D 25 HYDROXY (VIT D DEFICIENCY, FRACTURES): VIT D 25 HYDROXY: 54 ng/mL (ref 30–100)

## 2015-02-20 ENCOUNTER — Ambulatory Visit (INDEPENDENT_AMBULATORY_CARE_PROVIDER_SITE_OTHER): Payer: BC Managed Care – PPO | Admitting: Psychiatry

## 2015-02-20 ENCOUNTER — Encounter (HOSPITAL_COMMUNITY): Payer: Self-pay | Admitting: Psychiatry

## 2015-02-20 VITALS — BP 136/82 | HR 72 | Ht 61.0 in | Wt 165.0 lb

## 2015-02-20 DIAGNOSIS — F339 Major depressive disorder, recurrent, unspecified: Secondary | ICD-10-CM | POA: Diagnosis not present

## 2015-02-20 DIAGNOSIS — F331 Major depressive disorder, recurrent, moderate: Secondary | ICD-10-CM

## 2015-02-20 MED ORDER — LAMOTRIGINE 25 MG PO TABS: ORAL_TABLET | ORAL | Status: DC

## 2015-02-20 MED ORDER — FLUOXETINE HCL 20 MG PO CAPS: 20.0000 mg | ORAL_CAPSULE | Freq: Every day | ORAL | Status: DC

## 2015-02-20 NOTE — Progress Notes (Signed)
Lauren [name] Initial Assessment Note  Lauren [name] [id number] 34 y.o.  02/20/2015 9:49 AM  Chief Complaint:  My primary care physician recommended to see psychiatrist.  My medicine is not working.  History of Present Illness:  Patient is 34 year old Caucasian, married, employed female who is referred from her primary care physician Dr. Melford Aase office for the management of depression and anxiety symptoms.  Patient told recently her antidepressant changed in October from Prozac to Brintellix but she has not seen any improvement.  She endorse chronic fatigue, irritability, frustration, very emotional and having crying spells.  She endorse poor sleep, racing thoughts and having panic attacks.  She endorsed some time hopeless and worthless but denies any active or passive suicidal thoughts or homicidal thought.  She endorse decreased energy, difficulty doing multitasking and gets easily frustrated because she has poor attention and concentration.  She's been struggling with these symptoms since 2002 when she was in the college and she had tried numerous antidepressant in the past.  She has never seen psychiatrists.  She has multiple stressors in her life.  She is working as a Chief Technology Officer and she mentioned that her job is very stressful.  She has a lot of responsibilities.  She also endorsed multiple health issues and she is suffering from fibromyalgia, chronic fatigue, GERD, anemia, vitamin D deficiency and hypertension.  She endorse excessive worrying and chronic sadness and discouragement.  She has lost interest in her life with decreased motivation and she stays most of the time to herself.  She has gained 25 pounds in recent months.  She is seeing therapist Lovena Le for past 2 years and she feels some help due to counseling.  Her other stressors is her mother who has bipolar disorder and sometime she do not get along with her very well.  Patient denies any hallucination,  mania, psychosis, aggression, violence, OCD, PTSD or any self abusive behavior.  She remember had a good response with Effexor and Prozac in the past but she developed significant withdrawal symptoms from Effexor when she did not refill her medication and she does not want to go back on Effexor.  She was doing fairly well on Prozac however her physician tried new medication in October Brintellix which is not working.  Patient denies drinking alcohol or using any illegal substances.  Her vitals are stable.  Suicidal Ideation: No Plan Formed: No Patient has means to carry out plan: No  Homicidal Ideation: No Plan Formed: No Patient has means to carry out plan: No  Past Psychiatric History/Hospitalization(s) Patient has never seen psychiatrist before.  She has no history of suicidal attempt or inpatient psychiatric treatment.  She started taking antidepressant when she was in the college in 2002 from primary care physician.  She remembered at that time feeling isolated, withdrawn, having crying spells and decreased energy.  She had tried Effexor and Prozac with good response by her primary care physician.  She also remembered taking Depakote, Zoloft, Celexa, Cymbalta, Wellbutrin but do not remember the details.  Recently her primary care physician started Brintellix but patient do not see any improvement.  Patient denies any history of abuse in the past.   Anxiety: Yes Bipolar Disorder: No Depression: Yes Mania: No Psychosis: No Schizophrenia: No Personality Disorder: No Hospitalization for psychiatric illness: No History of Electroconvulsive Shock Therapy: No Prior Suicide Attempts: No  Medical History; Patient has history of HSV, hypertension, GERD, hemorrhoids and surgical history of tonsillectomy and cholecystectomy.  She has  obesity, fibromyalgia, hypothyroidism and vitamin D deficiency.  Her primary care physician is Dr. Idell Pickles at [facility].  Patient  denies any history of seizures.  Traumatic brain injury: Patient denies any history of traumatic brain injury.  Family History; Patient endorse brother and mother has bipolar disorder.  Both have require inpatient psychiatric treatment.  Education and Work History; Patient has college education.  She is working as a Agricultural engineer for more than 8 years.  Psychosocial History; Patient born and raised in [place].  Her parents were divorced when she was only 14 years old.  She is raised by her mother.  Both of her parents remarried later.  Patient married once and her husband is very supportive.  She has 65 and 63 years old daughter.  Legal History; Patient denies any legal issues.  History Of Abuse; Patient denies any history of abuse.  Substance Abuse History; Patient denies any history of drinking or using any illegal substance use.  Review of Systems: Psychiatric: Agitation: Irritability Hallucination: No Depressed Mood: Yes Insomnia: Yes Hypersomnia: No Altered Concentration: No Feels Worthless: Yes Grandiose Ideas: No Belief In Special Powers: No New/Increased Substance Abuse: No Compulsions: No  Neurologic: Headache: No Seizure: No Paresthesias: No   Musculoskeletal: Strength & Muscle Tone: within normal limits Gait & Station: normal Patient leans: N/A   Outpatient Encounter Prescriptions as of 02/20/2015  Medication Sig  . ALPRAZolam (XANAX) 0.25 MG tablet Take 1 tablet (0.25 mg total) by mouth 2 (two) times daily. MDD: 2 tablets per day (Patient not taking: Reported on 01/28/2015)  . Cholecalciferol (VITAMIN D) 2000 UNITS CAPS Take 8,000 Units by mouth daily.   Marland Kitchen FLUoxetine (PROZAC) 20 MG capsule Take 1 capsule (20 mg total) by mouth daily.  Marland Kitchen ibuprofen (ADVIL,MOTRIN) 200 MG tablet Take 200 mg by mouth every 6 (six) hours as needed for pain. Takes 4 tablets (800 mg) every 4-6 hrs prn  . lamoTRIgine (LAMICTAL) 25 MG tablet Take 1 tab daily for 1 week  and than 2 tab daily  . levothyroxine (SYNTHROID) 100 MCG tablet Take 1 to 1&1/2 tablets daily as directed for Thyroid  . magnesium 30 MG tablet Take 250 mg by mouth 1 day or 1 dose.  . metroNIDAZOLE (METROGEL) 0.75 % vaginal gel   . polyethylene glycol (MIRALAX / GLYCOLAX) packet Take 17 g by mouth daily.  . [DISCONTINUED] traZODone (DESYREL) 50 MG tablet Take 1 tablet (50 mg total) by mouth at bedtime.  . [DISCONTINUED] Vortioxetine HBr (BRINTELLIX) 10 MG TABS Take 1 tablet (10 mg total) by mouth daily. Samples put up front.    Recent Results (from the past 2160 hour(s))  Cytology - PAP     Status: None   Collection Time: 12/10/14 12:00 AM  Result Value Ref Range   CYTOLOGY - PAP PAP RESULT   CBC with Differential/Platelet     Status: None   Collection Time: 01/28/15  4:14 PM  Result Value Ref Range   WBC 8.0 4.0 - 10.5 K/uL   RBC 4.53 3.87 - 5.11 MIL/uL   Hemoglobin 13.0 12.0 - 15.0 g/dL   HCT 39.0 36.0 - 46.0 %   MCV 86.1 78.0 - 100.0 fL   MCH 28.7 26.0 - 34.0 pg   MCHC 33.3 30.0 - 36.0 g/dL   RDW 13.8 11.5 - 15.5 %   Platelets 183 150 - 400 K/uL   MPV 10.9 8.6 - 12.4 fL   Neutrophils Relative % 54  43 - 77 %   Neutro Abs 4.3 1.7 - 7.7 K/uL   Lymphocytes Relative 38 12 - 46 %   Lymphs Abs 3.0 0.7 - 4.0 K/uL   Monocytes Relative 7 3 - 12 %   Monocytes Absolute 0.6 0.1 - 1.0 K/uL   Eosinophils Relative 1 0 - 5 %   Eosinophils Absolute 0.1 0.0 - 0.7 K/uL   Basophils Relative 0 0 - 1 %   Basophils Absolute 0.0 0.0 - 0.1 K/uL   Smear Review Criteria for review not met   BASIC METABOLIC PANEL WITH GFR     Status: None   Collection Time: 01/28/15  4:14 PM  Result Value Ref Range   Sodium 137 135 - 145 mEq/L   Potassium 4.2 3.5 - 5.3 mEq/L   Chloride 105 96 - 112 mEq/L   CO2 25 19 - 32 mEq/L   Glucose, Bld 87 70 - 99 mg/dL   BUN 11 6 - 23 mg/dL   Creat 0.62 0.50 - 1.10 mg/dL   Calcium 9.0 8.4 - 10.5 mg/dL   GFR, Est African American >89 mL/min   GFR, Est Non African  American >89 mL/min    Comment:   The estimated GFR is a calculation valid for adults (>=64 years old) that uses the CKD-EPI algorithm to adjust for age and sex. It is   not to be used for children, pregnant women, hospitalized patients,    patients on dialysis, or with rapidly changing kidney function. According to the NKDEP, eGFR >89 is normal, 60-89 shows mild impairment, 30-59 shows moderate impairment, 15-29 shows severe impairment and <15 is ESRD.     TSH     Status: None   Collection Time: 01/28/15  4:14 PM  Result Value Ref Range   TSH 3.881 0.350 - 4.500 uIU/mL  Vit D  25 hydroxy (rtn osteoporosis monitoring)     Status: None   Collection Time: 01/28/15  4:14 PM  Result Value Ref Range   Vit D, 25-Hydroxy 54 30 - 100 ng/mL    Comment: Vitamin D Status           25-OH Vitamin D        Deficiency                <20 ng/mL        Insufficiency         20 - 29 ng/mL        Optimal             > or = 30 ng/mL   For 25-OH Vitamin D testing on patients on D2-supplementation and patients for whom quantitation of D2 and D3 fractions is required, the QuestAssureD 25-OH VIT D, (D2,D3), LC/MS/MS is recommended: order code [id number] (patients > 2 yrs).   Iron and TIBC     Status: Abnormal   Collection Time: 01/28/15  4:14 PM  Result Value Ref Range   Iron 32 (L) 42 - 145 ug/dL   UIBC 337 125 - 400 ug/dL   TIBC 369 250 - 470 ug/dL   %SAT 9 (L) 20 - 55 %  Vitamin B12     Status: None   Collection Time: 01/28/15  4:14 PM  Result Value Ref Range   Vitamin B-12 562 211 - 911 pg/mL  Urinalysis, Routine w reflex microscopic     Status: None   Collection Time: 01/28/15  4:14 PM  Result Value Ref Range   Color, Urine YELLOW  YELLOW   APPearance CLEAR CLEAR   Specific Gravity, Urine 1.010 1.005 - 1.030   pH 6.5 5.0 - 8.0   Glucose, UA NEG NEG mg/dL   Bilirubin Urine NEG NEG   Ketones, ur NEG NEG mg/dL   Hgb urine dipstick NEG NEG   Protein, ur NEG NEG mg/dL   Urobilinogen, UA 0.2 0.0  - 1.0 mg/dL   Nitrite NEG NEG   Leukocytes, UA NEG NEG      Constitutional:  BP 136/82 mmHg  Pulse 72  Ht 5' 1" (1.549 m)  Wt 165 lb (74.844 kg)  BMI 31.19 kg/m2  LMP 01/07/2015   Mental Status Examination;  Patient is well groomed well dressed female who appears to be in her stated age.  She is anxious but cooperative.  She maintained fair eye contact.  Her speech is soft, clear, coherent with normal tone and volume.  She described her mood nervous anxious and depressed.  Her affect is constricted.  She is easily tearful and emotional when she is talking about her symptoms.  Her thought process slow but logical and goal-directed.  There were no flight of ideas or any loose association.  Her psychomotor activity is slow.  Her fund of knowledge is good.  She denies any auditory or visual hallucination.  She denies any active or passive suicidal thoughts or homicidal thought.  There were no delusions, paranoia or any obsessive thoughts.  Her cognition is good.  She's alert and oriented 3.  There were no tremors or shakes.  Her insight judgment and impulse control is okay.   New problem, with additional work up planned, Review of Psycho-Social Stressors (1), Review or order clinical lab tests (1), Decision to obtain old records (1), Review and summation of old records (2), Established Problem, Worsening (2), New Problem, with no additional work-up planned (3), Review of Medication Regimen & Side Effects (2) and Review of New Medication or Change in Dosage (2)  Assessment: Axis I: Major depressive disorder, recurrent  Axis II: Deferred  Axis III:  Past Medical History  Diagnosis Date  . Depression   . Hypothyroidism   . HSV-1 infection   . Fibromyalgia   . Preeclampsia   . Hypertension   . Anxiety   . Hemorrhoids   . Constipation   . GERD (gastroesophageal reflux disease)   . Other abnormal glucose   . Anemia      Plan:  I review her symptoms, history, current medication  and recent blood work.  She had tried numerous antidepressant and she remember had a good response with Effexor and Prozac.  She had significant withdrawal symptoms from Effexor when she is unable to refill the prescription and she does not want to go back on Effexor again.  She is okay to go back on Prozac.  I will discontinue Brintellix and a start Prozac 20 mg daily.  I will also add Lamictal 25 mg daily to help the mood lability.  Discussed medication side effects and benefits specially if she developed a rash that she needed to stop the medication immediately.  She is taking Xanax 0.25 mg at night from her primary care physician which she will continue for now.  Encouraged to keep appointment with Lovena Le for counseling.  Recommended to call us back if she has any question, concern if he feel worsening of the symptom.  Follow-up in 2-3 weeks.Time spent 55 minutes.  More than 50% of the time spent in psychoeducation, counseling and coordination  of care.  Discuss safety plan that anytime having active suicidal thoughts or homicidal thoughts then patient need to call 911 or go to the local emergency room.    Shann Lewellyn T., MD 02/20/2015

## 2015-02-24 ENCOUNTER — Encounter: Payer: Self-pay | Admitting: Emergency Medicine

## 2015-03-02 ENCOUNTER — Ambulatory Visit (HOSPITAL_COMMUNITY): Payer: BC Managed Care – PPO | Admitting: Psychiatry

## 2015-03-03 ENCOUNTER — Encounter: Payer: Self-pay | Admitting: Emergency Medicine

## 2015-03-03 ENCOUNTER — Ambulatory Visit (INDEPENDENT_AMBULATORY_CARE_PROVIDER_SITE_OTHER): Payer: BC Managed Care – PPO | Admitting: Emergency Medicine

## 2015-03-03 VITALS — BP 104/70 | HR 70 | Temp 98.6°F | Resp 16 | Ht 61.0 in | Wt 168.0 lb

## 2015-03-03 DIAGNOSIS — R6889 Other general symptoms and signs: Secondary | ICD-10-CM

## 2015-03-03 DIAGNOSIS — R300 Dysuria: Secondary | ICD-10-CM

## 2015-03-03 DIAGNOSIS — Z139 Encounter for screening, unspecified: Secondary | ICD-10-CM

## 2015-03-03 DIAGNOSIS — D649 Anemia, unspecified: Secondary | ICD-10-CM

## 2015-03-03 DIAGNOSIS — Z1212 Encounter for screening for malignant neoplasm of rectum: Secondary | ICD-10-CM

## 2015-03-03 DIAGNOSIS — R739 Hyperglycemia, unspecified: Secondary | ICD-10-CM

## 2015-03-03 DIAGNOSIS — E782 Mixed hyperlipidemia: Secondary | ICD-10-CM

## 2015-03-03 DIAGNOSIS — E538 Deficiency of other specified B group vitamins: Secondary | ICD-10-CM

## 2015-03-03 DIAGNOSIS — I10 Essential (primary) hypertension: Secondary | ICD-10-CM

## 2015-03-03 DIAGNOSIS — E559 Vitamin D deficiency, unspecified: Secondary | ICD-10-CM

## 2015-03-03 DIAGNOSIS — R5381 Other malaise: Secondary | ICD-10-CM

## 2015-03-03 DIAGNOSIS — Z79899 Other long term (current) drug therapy: Secondary | ICD-10-CM

## 2015-03-03 DIAGNOSIS — Z0000 Encounter for general adult medical examination without abnormal findings: Secondary | ICD-10-CM

## 2015-03-03 DIAGNOSIS — Z0001 Encounter for general adult medical examination with abnormal findings: Secondary | ICD-10-CM

## 2015-03-03 NOTE — Patient Instructions (Signed)
Fish oil/ Zyrtec in evening for skin, stop Benadryl. See DERM. Cranberry for urine   Eczema Eczema, also called atopic dermatitis, is a skin disorder that causes inflammation of the skin. It causes a red rash and dry, scaly skin. The skin becomes very itchy. Eczema is generally worse during the cooler winter months and often improves with the warmth of summer. Eczema usually starts showing signs in infancy. Some children outgrow eczema, but it may last through adulthood.  CAUSES  The exact cause of eczema is not known, but it appears to run in families. People with eczema often have a family history of eczema, allergies, asthma, or hay fever. Eczema is not contagious. Flare-ups of the condition may be caused by:   Contact with something you are sensitive or allergic to.   Stress. SIGNS AND SYMPTOMS  Dry, scaly skin.   Red, itchy rash.   Itchiness. This may occur before the skin rash and may be very intense.  DIAGNOSIS  The diagnosis of eczema is usually made based on symptoms and medical history. TREATMENT  Eczema cannot be cured, but symptoms usually can be controlled with treatment and other strategies. A treatment plan might include:  Controlling the itching and scratching.   Use over-the-counter antihistamines as directed for itching. This is especially useful at night when the itching tends to be worse.   Use over-the-counter steroid creams as directed for itching.   Avoid scratching. Scratching makes the rash and itching worse. It may also result in a skin infection (impetigo) due to a break in the skin caused by scratching.   Keeping the skin well moisturized with creams every day. This will seal in moisture and help prevent dryness. Lotions that contain alcohol and water should be avoided because they can dry the skin.   Limiting exposure to things that you are sensitive or allergic to (allergens).   Recognizing situations that cause stress.   Developing a  plan to manage stress.  HOME CARE INSTRUCTIONS   Only take over-the-counter or prescription medicines as directed by your health care provider.   Do not use anything on the skin without checking with your health care provider.   Keep baths or showers short (5 minutes) in warm (not hot) water. Use mild cleansers for bathing. These should be unscented. You may add nonperfumed bath oil to the bath water. It is best to avoid soap and bubble bath.   Immediately after a bath or shower, when the skin is still damp, apply a moisturizing ointment to the entire body. This ointment should be a petroleum ointment. This will seal in moisture and help prevent dryness. The thicker the ointment, the better. These should be unscented.   Keep fingernails cut short. Children with eczema may need to wear soft gloves or mittens at night after applying an ointment.   Dress in clothes made of cotton or cotton blends. Dress lightly, because heat increases itching.   A child with eczema should stay away from anyone with fever blisters or cold sores. The virus that causes fever blisters (herpes simplex) can cause a serious skin infection in children with eczema. SEEK MEDICAL CARE IF:   Your itching interferes with sleep.   Your rash gets worse or is not better within 1 week after starting treatment.   You see pus or soft yellow scabs in the rash area.   You have a fever.   You have a rash flare-up after contact with someone who has fever blisters.  Document  Released: 11/04/2000 Document Revised: 08/28/2013 Document Reviewed: 06/10/2013 [facility] Patient Information 2015 [place], [place]. This information is not intended to replace advice given to you by your health care provider. Make sure you discuss any questions you have with your health care provider.

## 2015-03-03 NOTE — Progress Notes (Signed)
Subjective:    Patient ID: Lauren [name], female    DOB: 09/26/1981, 34 y.o.   MRN: [id number]  HPI Comments: 34 yo WF CPE and presents for 3 month F/U for Cholesterol, Pre-Dm, D. Deficient.  She has gained 25 lbs with old RX Brintellix and has not been exercising as much. She was able to get off cholesterol medicine with diet and exercise last year. She has been controlling elevated BS with diet as well. She has been feeling more fatigued with weight gain and decreased activity level.  She has started seeing Psych and has seen some improvement with depression. She notes sleep is good with Benadryl. She changed medicine on 02/20/15 from Brintellix to Prozac/ Lamictal.She has f/u soon.   She has had itch on both hands R>L worse at night. She has history of B12/Iron anemia in past. She notes mildly elevated red bumps at site of itch. She is unsure of any new exposures except depression medication.  She has had itching/ dysuria x 2-3 weeks and has had mild relief with cranberry juice. She has chronic recurrent UTI and BV history. She has prophylactic medication provided by GYN for intercourse to decrease recurrence.     Lab Results      Component                Value               Date                      WBC                      8.0                 01/28/2015                HGB                      13.0                01/28/2015                HCT                      39.0                01/28/2015                PLT                      183                 01/28/2015                GLUCOSE                  87                  01/28/2015                CHOL                     171                 07/03/2014                TRIG  114                 07/03/2014                HDL                      60                  07/03/2014                LDLDIRECT                114.9               04/30/2012                LDLCALC                  88                  07/03/2014                 ALT                      9                   09/17/2014                AST                      12                  09/17/2014                NA                       137                 01/28/2015                K                        4.2                 01/28/2015                CL                       105                 01/28/2015                CREATININE               0.62                01/28/2015                BUN                      11                  01/28/2015                CO2                      25  01/28/2015                TSH                      3.881               01/28/2015                INR                      0.9                 05/27/2009                HGBA1C                   5.3                 02/20/2014                MICROALBUR               0.81                02/20/2014                Medication List       This list is accurate as of: 03/03/15 11:59 PM.  Always use your most recent med list.               ALPRAZolam 0.25 MG tablet  Commonly known as:  XANAX  Take 1 tablet (0.25 mg total) by mouth 2 (two) times daily. MDD: 2 tablets per day     diphenhydrAMINE 25 MG tablet  Commonly known as:  BENADRYL  Take 25 mg by mouth every 6 (six) hours as needed.     FLUoxetine 20 MG capsule  Commonly known as:  PROZAC  Take 1 capsule (20 mg total) by mouth daily.     ibuprofen 200 MG tablet  Commonly known as:  ADVIL,MOTRIN  Take 200 mg by mouth every 6 (six) hours as needed for pain. Takes 4 tablets (800 mg) every 4-6 hrs prn     lamoTRIgine 25 MG tablet  Commonly known as:  LAMICTAL  Take 1 tab daily for 1 week and than 2 tab daily     levothyroxine 100 MCG tablet  Commonly known as:  SYNTHROID  Take 1 to 1&1/2 tablets daily as directed for Thyroid     magnesium 30 MG tablet  Take 250 mg by mouth 1 day or 1 dose.     metroNIDAZOLE 0.75 % vaginal gel  Commonly known as:  METROGEL     polyethylene glycol  packet  Commonly known as:  MIRALAX / GLYCOLAX  Take 17 g by mouth daily.     Vitamin D 2000 UNITS Caps  Take 8,000 Units by mouth daily.       Allergies  Allergen Reactions  . Doxycycline Other (See Comments)    GI UPSET  . Effexor [Venlafaxine] Other (See Comments)    DYSP         DYSPHORIA  . Prednisone Other (See Comments)    FLUSHED  . Savella [Milnacipran Hcl] Other (See Comments)    NO RELIEF   Past Medical History  Diagnosis Date  . Depression   . Hypothyroidism   . HSV-1 infection   . Fibromyalgia   . Preeclampsia   . Hypertension   .  Anxiety   . Hemorrhoids   . Constipation   . GERD (gastroesophageal reflux disease)   . Other abnormal glucose   . Anemia    Past Surgical History  Procedure Laterality Date  . Cholecystectomy  2006  . Tonsillectomy    . Wisdom tooth extraction     History  Substance Use Topics  . Smoking status: Never Smoker   . Smokeless tobacco: Never Used  . Alcohol Use: No     Comment: occ   Family History  Problem Relation Age of Onset  . Heart disease Maternal Grandmother   . Diabetes Maternal Grandmother   . Cancer Maternal Grandmother     SKIN  . Hyperlipidemia Maternal Grandmother   . Hypertension Maternal Grandmother   . Hypothyroidism Mother   . Cervical cancer Mother   . Bipolar disorder Mother   . Diabetes Mother   . Colon polyps Mother   . Bipolar disorder Brother   . Kidney disease Brother   . Alcohol abuse Brother   . Lung cancer Maternal Grandfather   . Heart disease Maternal Aunt   . Diabetes Maternal Aunt   . Diabetes Cousin   . Colon cancer Neg Hx    MAINTENANCE: Colonoscopy:09/21/11 polyp due 2017 Mammo:n/a BMD:n/a Pap/ Pelvic:12/10/14 EYE: 2015 Dentist:q 29month  IMMUNIZATIONS: Tdap:2012 Pneumovax:n/a Zostavax:n/a Influenza:2015  Patient Care Team: Lucky Cowboy, MD as PCP - General (Internal Medicine) Fredrich Birks, OD as Referring Physician (Optometry) Sherian Rein, MD  as Consulting Physician (Obstetrics and Gynecology) Dairl Ponder, MD as Consulting Physician (Orthopedic Surgery) Elvina Sidle, MD (Neurology) Venancio Poisson, MD as Consulting Physician (Dermatology) Pollyann Savoy, MD as Consulting Physician (Rheumatology) Cleotis Nipper, MD as Consulting Physician (Psychiatry) Karl Ito, (Dentist)  Review of Systems  Constitutional: Positive for fatigue.  HENT: Negative for congestion.   Respiratory: Negative for shortness of breath.   Cardiovascular: Negative for chest pain.  Gastrointestinal: Negative for abdominal pain.  Genitourinary: Positive for dysuria.  Skin: Positive for rash.  Psychiatric/Behavioral: Negative for suicidal ideas. The patient is not nervous/anxious.   All other systems reviewed and are negative.  BP 104/70 mmHg  Pulse 70  Temp(Src) 98.6 F (37 C) (Temporal)  Resp 16  Ht 5\' 1"  (1.549 m)  Wt 168 lb (76.204 kg)  BMI 31.76 kg/m2  LMP 03/03/2015     Objective:   Physical Exam  Constitutional: She is oriented to person, place, and time. She appears well-developed and well-nourished. No distress.  HENT:  Head: Normocephalic.  Nose: Nose normal.  Mouth/Throat: Oropharynx is clear and moist.  Eyes: Conjunctivae and EOM are normal. Pupils are equal, round, and reactive to light. No scleral icterus.  Neck: Normal range of motion. Neck supple. No JVD present. No tracheal deviation present. No thyromegaly present.  Cardiovascular: Normal rate, regular rhythm, normal heart sounds and intact distal pulses.   Pulmonary/Chest: Effort normal and breath sounds normal.  Abdominal: Soft. Bowel sounds are normal. She exhibits no distension and no mass. There is no tenderness.  Genitourinary:  Def gyn  Musculoskeletal: Normal range of motion. She exhibits no edema or tenderness.       Hands:      Feet:  Lymphadenopathy:    She has no cervical adenopathy.  Neurological: She is alert and oriented to person, place, and time.  She has normal reflexes. No cranial nerve deficit. She exhibits normal muscle tone. Coordination normal.  Skin: Skin is warm and dry. No rash noted. No erythema.  Full at [facility]  Psychiatric: She  has a normal mood and affect. Her behavior is normal. Judgment and thought content normal.  Nursing note and vitals reviewed.        EKG NSCSPT WNL   Assessment & Plan:  1. CPE- Update screening labs/ History/ Immunizations/ Testing as needed. Advised healthy diet, QD exercise, increase H20 and continue RX/ Vitamins AD.  2. ? Eczema- stop benadryl and try zyrtec/ fish oil/ mild soaps/ warm showers, if no change f/u DERM may need to change Psych medications  3.  3 month F/U Cholesterol, Pre-Dm, D. Deficient. Needs healthy diet, cardio QD and obtain healthy weight. Check Labs, Check BP if >130/80 call office   4. Anemia vs Fatigue- check labs, increase activity and H2O   5. Dysuria vs BV recurrence history- check labs, Bland diet, Add cranberry juice, decrease caffeine/ acidic/ spicy foods if NEG f/u GYN  6.  Irreg Nevi- monitor for any change, call if occurs for removal

## 2015-03-04 ENCOUNTER — Other Ambulatory Visit: Payer: Self-pay | Admitting: Internal Medicine

## 2015-03-04 LAB — BASIC METABOLIC PANEL WITH GFR
BUN: 11 mg/dL (ref 6–23)
CALCIUM: 8.9 mg/dL (ref 8.4–10.5)
CO2: 27 mEq/L (ref 19–32)
CREATININE: 0.59 mg/dL (ref 0.50–1.10)
Chloride: 101 mEq/L (ref 96–112)
GLUCOSE: 86 mg/dL (ref 70–99)
Potassium: 4 mEq/L (ref 3.5–5.3)
Sodium: 138 mEq/L (ref 135–145)

## 2015-03-04 LAB — FOLATE RBC: RBC Folate: 832 ng/mL (ref >280)

## 2015-03-04 LAB — HEPATIC FUNCTION PANEL
ALK PHOS: 48 U/L (ref 39–117)
ALT: 10 U/L (ref 0–35)
AST: 12 U/L (ref 0–37)
Albumin: 4.2 g/dL (ref 3.5–5.2)
BILIRUBIN TOTAL: 0.3 mg/dL (ref 0.2–1.2)
TOTAL PROTEIN: 6.8 g/dL (ref 6.0–8.3)

## 2015-03-04 LAB — VITAMIN B12: VITAMIN B 12: 1049 pg/mL — AB (ref 211–911)

## 2015-03-04 LAB — URINALYSIS, ROUTINE W REFLEX MICROSCOPIC
BILIRUBIN URINE: NEGATIVE
GLUCOSE, UA: NEGATIVE mg/dL
Ketones, ur: NEGATIVE mg/dL
Nitrite: NEGATIVE
PH: 6.5 (ref 5.0–8.0)
Protein, ur: NEGATIVE mg/dL
SPECIFIC GRAVITY, URINE: 1.009 (ref 1.005–1.030)
Urobilinogen, UA: 0.2 mg/dL (ref 0.0–1.0)

## 2015-03-04 LAB — IRON AND TIBC
%SAT: 8 % — AB (ref 20–55)
IRON: 30 ug/dL — AB (ref 42–145)
TIBC: 362 ug/dL (ref 250–470)
UIBC: 332 ug/dL (ref 125–400)

## 2015-03-04 LAB — HEMOGLOBIN A1C
HEMOGLOBIN A1C: 5.7 % — AB (ref <5.7)
MEAN PLASMA GLUCOSE: 117 mg/dL — AB (ref <117)

## 2015-03-04 LAB — MAGNESIUM: Magnesium: 2 mg/dL (ref 1.5–2.5)

## 2015-03-04 LAB — URINALYSIS, MICROSCOPIC ONLY
BACTERIA UA: NONE SEEN
CASTS: NONE SEEN
Crystals: NONE SEEN
Squamous Epithelial / LPF: NONE SEEN

## 2015-03-04 LAB — LIPID PANEL
Cholesterol: 192 mg/dL (ref 0–200)
HDL: 43 mg/dL — ABNORMAL LOW (ref >=46)
LDL CALC: 120 mg/dL — AB (ref 0–99)
Total CHOL/HDL Ratio: 4.5 Ratio
Triglycerides: 147 mg/dL (ref <150)
VLDL: 29 mg/dL (ref 0–40)

## 2015-03-04 LAB — VITAMIN D 25 HYDROXY (VIT D DEFICIENCY, FRACTURES): VIT D 25 HYDROXY: 56 ng/mL (ref 30–100)

## 2015-03-04 LAB — INSULIN, FASTING: INSULIN FASTING, SERUM: 6.1 u[IU]/mL (ref 2.0–19.6)

## 2015-03-05 ENCOUNTER — Other Ambulatory Visit: Payer: Self-pay | Admitting: Emergency Medicine

## 2015-03-05 LAB — URINE CULTURE
Colony Count: NO GROWTH
Organism ID, Bacteria: NO GROWTH

## 2015-03-05 MED ORDER — PRAVASTATIN SODIUM 40 MG PO TABS: 40.0000 mg | ORAL_TABLET | Freq: Every day | ORAL | Status: DC

## 2015-03-09 ENCOUNTER — Ambulatory Visit (HOSPITAL_COMMUNITY): Payer: Self-pay | Admitting: Psychiatry

## 2015-03-17 ENCOUNTER — Encounter: Payer: Self-pay | Admitting: *Deleted

## 2015-03-23 ENCOUNTER — Encounter (HOSPITAL_COMMUNITY): Payer: Self-pay | Admitting: Psychiatry

## 2015-03-23 ENCOUNTER — Ambulatory Visit (INDEPENDENT_AMBULATORY_CARE_PROVIDER_SITE_OTHER): Payer: BC Managed Care – PPO | Admitting: Psychiatry

## 2015-03-23 VITALS — BP 123/80 | HR 73 | Ht 61.0 in | Wt 172.0 lb

## 2015-03-23 DIAGNOSIS — F339 Major depressive disorder, recurrent, unspecified: Secondary | ICD-10-CM | POA: Diagnosis not present

## 2015-03-23 DIAGNOSIS — F331 Major depressive disorder, recurrent, moderate: Secondary | ICD-10-CM

## 2015-03-23 MED ORDER — LAMOTRIGINE 25 MG PO TABS: ORAL_TABLET | ORAL | Status: DC

## 2015-03-23 MED ORDER — FLUOXETINE HCL 40 MG PO CAPS: 40.0000 mg | ORAL_CAPSULE | Freq: Every day | ORAL | Status: DC

## 2015-03-23 NOTE — Progress Notes (Signed)
[facility] [phone] Progress Note  Lauren [name] [id number] 34 y.o.  03/23/2015 4:36 PM  Chief Complaint:   I like Prozac and Lamictal.  It is helping some of my anxiety and depression but I still have no energy and I'm is still feel depressed.  History of Present Illness:  Lauren [name]  Came for her follow-up appointment.  She is a 34 year old Caucasian female who was referred from primary care physician and seen first time on April 1. She endorse chronic fatigue, irritability, having crying spells  And racing thoughts. Her major stressor are stressful job and having a lot of responsibilities. She also endorsed multiple health issues.  She is suffering from fibromyalgia, chronic fatigue, GERD, anemia and vitamin D deficiency. We started her on Prozac 20 mg and added Lamictal 25 mg daily.  She is tolerating Lamictal without any side effects.  She has no rash or itching. She is feeling some improvement in her depression. She denies any crying spells and have sleep is improved from the past.  However she still have difficulty doing her job.  She gets easily tired, feeling overwhelmed and remains very isolated and withdrawn. Though she denies any suicidal thoughts or homicidal thought but admitted some time fatigue and lack of motivation to do many things. She denies any aggression or violence.  She denies any tremors or shakes. She is taking Xanax which is prescribed by her primary care physician.  Patient denies any side effects, she denies drinking or using any illegal substances. She is concerned about her weight and  since last visit she has gained another 3 pounds. She is no longer taking Brintellix.  She lives with her husband and her 74 and 36 years old daughter.  Her husband is very supportive.  Patient was recently seen by her primary care physician and she had blood work which is normal.  Suicidal Ideation: No Plan Formed: No Patient has means to carry out plan: No  Homicidal Ideation: No  Plan Formed: No Patient has means to carry out plan: No  Past Psychiatric History/Hospitalization(s)  she remember taking antidepressant since 2002 when she was in college from primary care physician.  At that time she remembered feeling isolated, withdrawn, having crying spells and decreased energy.  She had tried Effexor and Prozac with good response by her primary care physician.  She also remembered taking Depakote, Zoloft, Celexa, Cymbalta, Wellbutrin and recently Brintellix. Patient denies any history of abuse in the past.   Anxiety: Yes Bipolar Disorder: No Depression: Yes Mania: No Psychosis: No Schizophrenia: No Personality Disorder: No Hospitalization for psychiatric illness: No History of Electroconvulsive Shock Therapy: No Prior Suicide Attempts: No  Medical History; Patient has history of HSV, hypertension, GERD, hemorrhoids and surgical history of tonsillectomy and cholecystectomy.  She has obesity, fibromyalgia, hypothyroidism and vitamin D deficiency.  Her primary care physician is Dr. Idell Pickles. Patient denies any history of seizures.  Education and Work History; Patient has college education.  She is working as a Agricultural engineer for more than 8 years.  Review of Systems  Constitutional: Positive for malaise/fatigue. Negative for weight loss.  HENT: Negative.   Respiratory: Negative.   Cardiovascular: Negative.   Skin: Negative for itching and rash.  Neurological: Negative for tremors and headaches.  Psychiatric/Behavioral: Positive for depression. Negative for suicidal ideas, hallucinations, memory loss and substance abuse. The patient is nervous/anxious. The patient does not have insomnia.     Psychiatric: Agitation: No Hallucination: No Depressed Mood: Yes Insomnia: No  Hypersomnia: No Altered Concentration: No Feels Worthless: No Grandiose Ideas: No Belief In Special Powers: No New/Increased Substance Abuse: No Compulsions: No  Neurologic: Headache:  No Seizure: No Paresthesias: No   Musculoskeletal: Strength & Muscle Tone: within normal limits Gait & Station: normal Patient leans: N/A   Meds ordered this encounter  Medications  . FLUoxetine (PROZAC) 40 MG capsule    Sig: Take 1 capsule (40 mg total) by mouth daily.    Dispense:  30 capsule    Refill:  0  . lamoTRIgine (LAMICTAL) 25 MG tablet    Sig: Take 3 tab daily    Dispense:  90 tablet    Refill:  0    Recent Results (from the past 2160 hour(s))  CBC with Differential/Platelet     Status: None   Collection Time: 01/28/15  4:14 PM  Result Value Ref Range   WBC 8.0 4.0 - 10.5 K/uL   RBC 4.53 3.87 - 5.11 MIL/uL   Hemoglobin 13.0 12.0 - 15.0 g/dL   HCT 39.0 36.0 - 46.0 %   MCV 86.1 78.0 - 100.0 fL   MCH 28.7 26.0 - 34.0 pg   MCHC 33.3 30.0 - 36.0 g/dL   RDW 13.8 11.5 - 15.5 %   Platelets 183 150 - 400 K/uL   MPV 10.9 8.6 - 12.4 fL   Neutrophils Relative % 54 43 - 77 %   Neutro Abs 4.3 1.7 - 7.7 K/uL   Lymphocytes Relative 38 12 - 46 %   Lymphs Abs 3.0 0.7 - 4.0 K/uL   Monocytes Relative 7 3 - 12 %   Monocytes Absolute 0.6 0.1 - 1.0 K/uL   Eosinophils Relative 1 0 - 5 %   Eosinophils Absolute 0.1 0.0 - 0.7 K/uL   Basophils Relative 0 0 - 1 %   Basophils Absolute 0.0 0.0 - 0.1 K/uL   Smear Review Criteria for review not met   BASIC METABOLIC PANEL WITH GFR     Status: None   Collection Time: 01/28/15  4:14 PM  Result Value Ref Range   Sodium 137 135 - 145 mEq/L   Potassium 4.2 3.5 - 5.3 mEq/L   Chloride 105 96 - 112 mEq/L   CO2 25 19 - 32 mEq/L   Glucose, Bld 87 70 - 99 mg/dL   BUN 11 6 - 23 mg/dL   Creat 0.62 0.50 - 1.10 mg/dL   Calcium 9.0 8.4 - 10.5 mg/dL   GFR, Est African American >89 mL/min   GFR, Est Non African American >89 mL/min    Comment:   The estimated GFR is a calculation valid for adults (>=4 years old) that uses the CKD-EPI algorithm to adjust for age and sex. It is   not to be used for children, pregnant women, hospitalized  patients,    patients on dialysis, or with rapidly changing kidney function. According to the NKDEP, eGFR >89 is normal, 60-89 shows mild impairment, 30-59 shows moderate impairment, 15-29 shows severe impairment and <15 is ESRD.     TSH     Status: None   Collection Time: 01/28/15  4:14 PM  Result Value Ref Range   TSH 3.881 0.350 - 4.500 uIU/mL  Vit D  25 hydroxy (rtn osteoporosis monitoring)     Status: None   Collection Time: 01/28/15  4:14 PM  Result Value Ref Range   Vit D, 25-Hydroxy 54 30 - 100 ng/mL    Comment: Vitamin D Status  25-OH Vitamin D        Deficiency                <20 ng/mL        Insufficiency         20 - 29 ng/mL        Optimal             > or = 30 ng/mL   For 25-OH Vitamin D testing on patients on D2-supplementation and patients for whom quantitation of D2 and D3 fractions is required, the QuestAssureD 25-OH VIT D, (D2,D3), LC/MS/MS is recommended: order code [id number] (patients > 2 yrs).   Iron and TIBC     Status: Abnormal   Collection Time: 01/28/15  4:14 PM  Result Value Ref Range   Iron 32 (L) 42 - 145 ug/dL   UIBC 337 125 - 400 ug/dL   TIBC 369 250 - 470 ug/dL   %SAT 9 (L) 20 - 55 %  Vitamin B12     Status: None   Collection Time: 01/28/15  4:14 PM  Result Value Ref Range   Vitamin B-12 562 211 - 911 pg/mL  Urinalysis, Routine w reflex microscopic     Status: None   Collection Time: 01/28/15  4:14 PM  Result Value Ref Range   Color, Urine YELLOW YELLOW   APPearance CLEAR CLEAR   Specific Gravity, Urine 1.010 1.005 - 1.030   pH 6.5 5.0 - 8.0   Glucose, UA NEG NEG mg/dL   Bilirubin Urine NEG NEG   Ketones, ur NEG NEG mg/dL   Hgb urine dipstick NEG NEG   Protein, ur NEG NEG mg/dL   Urobilinogen, UA 0.2 0.0 - 1.0 mg/dL   Nitrite NEG NEG   Leukocytes, UA NEG NEG  Urine culture     Status: None   Collection Time: 03/03/15  4:40 PM  Result Value Ref Range   Colony Count NO GROWTH    Organism ID, Bacteria NO GROWTH   Hepatic  function panel     Status: None   Collection Time: 03/03/15  4:55 PM  Result Value Ref Range   Total Bilirubin 0.3 0.2 - 1.2 mg/dL   Bilirubin, Direct <0.1 0.0 - 0.3 mg/dL   Indirect Bilirubin NOT CALC 0.2 - 1.2 mg/dL   Alkaline Phosphatase 48 39 - 117 U/L   AST 12 0 - 37 U/L   ALT 10 0 - 35 U/L   Total Protein 6.8 6.0 - 8.3 g/dL   Albumin 4.2 3.5 - 5.2 g/dL  Lipid panel     Status: Abnormal   Collection Time: 03/03/15  4:55 PM  Result Value Ref Range   Cholesterol 192 0 - 200 mg/dL    Comment: ATP III Classification:       < 200        mg/dL        Desirable      200 - 239     mg/dL        Borderline High      >= 240        mg/dL        High      Triglycerides 147 <150 mg/dL   HDL 43 (L) >=46 mg/dL    Comment: ** Please note change in reference range(s). **   Total CHOL/HDL Ratio 4.5 Ratio   VLDL 29 0 - 40 mg/dL   LDL Cholesterol 120 (H) 0 - 99 mg/dL  Comment:   Total Cholesterol/HDL Ratio:CHD Risk                        Coronary Heart Disease Risk Table                                        Men       Women          1/2 Average Risk              3.4        3.3              Average Risk              5.0        4.4           2X Average Risk              9.6        7.1           3X Average Risk             23.4       11.0 Use the calculated Patient Ratio above and the CHD Risk table  to determine the patient's CHD Risk. ATP III Classification (LDL):       < 100        mg/dL         Optimal      100 - 129     mg/dL         Near or Above Optimal      130 - 159     mg/dL         Borderline High      160 - 189     mg/dL         High       > 190        mg/dL         Very High     BASIC METABOLIC PANEL WITH GFR     Status: None   Collection Time: 03/03/15  4:55 PM  Result Value Ref Range   Sodium 138 135 - 145 mEq/L   Potassium 4.0 3.5 - 5.3 mEq/L   Chloride 101 96 - 112 mEq/L   CO2 27 19 - 32 mEq/L   Glucose, Bld 86 70 - 99 mg/dL   BUN 11 6 - 23 mg/dL   Creat 0.59  0.50 - 1.10 mg/dL   Calcium 8.9 8.4 - 10.5 mg/dL   GFR, Est African American >89 mL/min   GFR, Est Non African American >89 mL/min    Comment:   The estimated GFR is a calculation valid for adults (>=51 years old) that uses the CKD-EPI algorithm to adjust for age and sex. It is   not to be used for children, pregnant women, hospitalized patients,    patients on dialysis, or with rapidly changing kidney function. According to the NKDEP, eGFR >89 is normal, 60-89 shows mild impairment, 30-59 shows moderate impairment, 15-29 shows severe impairment and <15 is ESRD.     Insulin, fasting     Status: None   Collection Time: 03/03/15  4:55 PM  Result Value Ref Range   Insulin fasting, serum 6.1 2.0 - 19.6 uIU/mL    Comment:   This insulin assay shows strong  cross-reactivity for some insulin analogs (lispro, aspart, and glargine) and much lower cross-reactivity with others (detemir, glulisine).   Stimulated Insulin reference intervals were established using the Siemens Immulite assay. These values are provided for general guidance only.   Hemoglobin A1c     Status: Abnormal   Collection Time: 03/03/15  4:55 PM  Result Value Ref Range   Hgb A1c MFr Bld 5.7 (H) <5.7 %    Comment:                                                                        According to the ADA Clinical Practice Recommendations for 2011, when HbA1c is used as a screening test:     >=6.5%   Diagnostic of Diabetes Mellitus            (if abnormal result is confirmed)   5.7-6.4%   Increased risk of developing Diabetes Mellitus   References:Diagnosis and Classification of Diabetes Mellitus,Diabetes ZOXW,9604,54(UJWJX 1):S62-S69 and Standards of Medical Care in         Diabetes - 2011,Diabetes Care,2011,34 (Suppl 1):S11-S61.      Mean Plasma Glucose 117 (H) <117 mg/dL  Vit D  25 hydroxy (rtn osteoporosis monitoring)     Status: None   Collection Time: 03/03/15  4:55 PM  Result Value Ref Range   Vit D,  25-Hydroxy 56 30 - 100 ng/mL    Comment: Vitamin D Status           25-OH Vitamin D        Deficiency                <20 ng/mL        Insufficiency         20 - 29 ng/mL        Optimal             > or = 30 ng/mL   For 25-OH Vitamin D testing on patients on D2-supplementation and patients for whom quantitation of D2 and D3 fractions is required, the QuestAssureD 25-OH VIT D, (D2,D3), LC/MS/MS is recommended: order code [phone] (patients > 2 yrs).   Magnesium     Status: None   Collection Time: 03/03/15  4:55 PM  Result Value Ref Range   Magnesium 2.0 1.5 - 2.5 mg/dL  Urinalysis, Routine w reflex microscopic     Status: Abnormal   Collection Time: 03/03/15  4:55 PM  Result Value Ref Range   Color, Urine YELLOW YELLOW   APPearance CLEAR CLEAR   Specific Gravity, Urine 1.009 1.005 - 1.030   pH 6.5 5.0 - 8.0   Glucose, UA NEG NEG mg/dL   Bilirubin Urine NEG NEG   Ketones, ur NEG NEG mg/dL   Hgb urine dipstick LARGE (A) NEG   Protein, ur NEG NEG mg/dL   Urobilinogen, UA 0.2 0.0 - 1.0 mg/dL   Nitrite NEG NEG   Leukocytes, UA SMALL (A) NEG  Vitamin B12     Status: Abnormal   Collection Time: 03/03/15  4:55 PM  Result Value Ref Range   Vitamin B-12 1049 (H) 211 - 911 pg/mL  Iron and TIBC     Status: Abnormal   Collection Time: 03/03/15  4:55 PM  Result Value Ref Range   Iron 30 (L) 42 - 145 ug/dL   UIBC 332 125 - 400 ug/dL   TIBC 362 250 - 470 ug/dL   %SAT 8 (L) 20 - 55 %  Folate RBC     Status: None   Collection Time: 03/03/15  4:55 PM  Result Value Ref Range   RBC Folate 832 >280 ng/mL    Comment: Reference range not established for pediatric patients.  Urine Microscopic     Status: Abnormal   Collection Time: 03/03/15  4:55 PM  Result Value Ref Range   Squamous Epithelial / LPF NONE SEEN RARE   Crystals NONE SEEN NONE SEEN   Casts NONE SEEN NONE SEEN   WBC, UA 0-2 <3 WBC/hpf   RBC / HPF 3-6 (A) <3 RBC/hpf   Bacteria, UA NONE SEEN RARE      Constitutional:  BP  123/80 mmHg  Pulse 73  Ht 5' 1"  (1.549 m)  Wt 172 lb (78.019 kg)  BMI 32.52 kg/m2  LMP 03/03/2015   Mental Status Examination;  Patient is well groomed well dressed female who appears to be in her stated age.  She is anxious but cooperative.  She maintained fair eye contact.  Her speech is soft, clear, coherent with normal tone and volume.  She described her mood anxious and depressed.  Her affect is constricted.   Her thought process slow but logical and goal-directed.  There were no flight of ideas or any loose association.  Her psychomotor activity is slow.  Her fund of knowledge is good.  She denies any auditory or visual hallucination.  She denies any active or passive suicidal thoughts or homicidal thought.  There were no delusions, paranoia or any obsessive thoughts.  Her cognition is good.  She's alert and oriented 3.  There were no tremors or shakes.  Her insight judgment and impulse control is okay.   Established Problem, Stable/Improving (1), Review of Psycho-Social Stressors (1), Review or order clinical lab tests (1), Review and summation of old records (2), Review of Last Therapy Session (1), Review of Medication Regimen & Side Effects (2) and Review of New Medication or Change in Dosage (2)  Assessment: Axis I: Major depressive disorder, recurrent  Axis II: Deferred  Axis III:  Past Medical History  Diagnosis Date  . Depression   . Hypothyroidism   . HSV-1 infection   . Fibromyalgia   . Preeclampsia   . Hypertension   . Anxiety   . Hemorrhoids   . Constipation   . GERD (gastroesophageal reflux disease)   . Other abnormal glucose   . Anemia      Plan:   I review her recent blood work results and her collateral information. She is doing little better with Prozac and Lamictal.  She has no side effects including any rash or itching. I recommended increase Prozac 40 mg and Lamictal 75 mg daily. Encouraged to keep appointment with her therapist Lovena Le.  We also  talk about increased weight gain and encouraged to do whatever exercise and walking which he used to do in the past. She is taking Xanax 0.25 mg at night which is given by her primary care physician. Discussed medication side effects and benefits.  Recommended to call us back if she has any question or any concern.  Follow-up in 4 weeks. Time spent 25 minutes. More than 50% of the time spent in psychoeducation, counseling and coordination of care.  Discuss safety plan that  anytime having active suicidal thoughts or homicidal thoughts then patient need to call 911 or go to the local emergency room.    Teryl Gubler T., MD 03/23/2015

## 2015-03-31 ENCOUNTER — Other Ambulatory Visit: Payer: Self-pay | Admitting: Emergency Medicine

## 2015-04-06 ENCOUNTER — Encounter: Payer: Self-pay | Admitting: *Deleted

## 2015-04-08 ENCOUNTER — Other Ambulatory Visit: Payer: BC Managed Care – PPO

## 2015-04-08 DIAGNOSIS — R319 Hematuria, unspecified: Secondary | ICD-10-CM

## 2015-04-08 NOTE — Progress Notes (Signed)
appt sched

## 2015-04-09 LAB — URINALYSIS, ROUTINE W REFLEX MICROSCOPIC
Bilirubin Urine: NEGATIVE
Glucose, UA: NEGATIVE mg/dL
Hgb urine dipstick: NEGATIVE
Ketones, ur: NEGATIVE mg/dL
Leukocytes, UA: NEGATIVE
NITRITE: NEGATIVE
Protein, ur: NEGATIVE mg/dL
Specific Gravity, Urine: 1.008 (ref 1.005–1.030)
UROBILINOGEN UA: 0.2 mg/dL (ref 0.0–1.0)
pH: 5.5 (ref 5.0–8.0)

## 2015-04-10 ENCOUNTER — Other Ambulatory Visit: Payer: Self-pay | Admitting: Internal Medicine

## 2015-04-10 LAB — URINE CULTURE

## 2015-04-10 MED ORDER — AMOXICILLIN 250 MG PO CAPS: ORAL_CAPSULE | ORAL | Status: DC

## 2015-04-22 ENCOUNTER — Encounter (HOSPITAL_COMMUNITY): Payer: Self-pay | Admitting: Psychiatry

## 2015-04-22 ENCOUNTER — Ambulatory Visit (INDEPENDENT_AMBULATORY_CARE_PROVIDER_SITE_OTHER): Payer: BC Managed Care – PPO | Admitting: Psychiatry

## 2015-04-22 VITALS — BP 119/83 | HR 76 | Ht 61.0 in | Wt 172.0 lb

## 2015-04-22 DIAGNOSIS — F339 Major depressive disorder, recurrent, unspecified: Secondary | ICD-10-CM

## 2015-04-22 DIAGNOSIS — F331 Major depressive disorder, recurrent, moderate: Secondary | ICD-10-CM

## 2015-04-22 MED ORDER — LAMOTRIGINE 100 MG PO TABS: 100.0000 mg | ORAL_TABLET | Freq: Every day | ORAL | Status: DC

## 2015-04-22 NOTE — Progress Notes (Signed)
[facility] Progress Note  Lauren [name] [id number] 34 y.o.  04/22/2015 4:30 PM  Chief Complaint:  Medication management and follow-up.    History of Present Illness:  Lauren [name] came for her follow-up appointment.  She is taking Lamictal and Prozac.  She denies any irritability or any anger.  She still at times complaining of fatigue and crying spells but denies any feeling of hopelessness or worthlessness.  She is recently moved to her new home and admitted it has been a stressful.  However she is hoping for some are holidays.  Patient is a Pharmacist, hospital .  She likes Lamictal which was increased on her last dose.  She has no rash or itching.  She still take half Xanax of the night time for insomnia.  Her appetite is okay.  Her vitals are stable.  Patient denies drinking or using any illegal substances.  Patient lives with her husband and HER-2 daughters.  Her husband is very supportive.  Suicidal Ideation: No Plan Formed: No Patient has means to carry out plan: No  Homicidal Ideation: No Plan Formed: No Patient has means to carry out plan: No  Past Psychiatric History/Hospitalization(s) She remember taking antidepressant since 2002 when she was in college from primary care physician.  At that time she remembered feeling isolated, withdrawn, having crying spells and decreased energy.  She had tried Effexor and Prozac with good response by her primary care physician.  She also remembered taking Depakote, Zoloft, Celexa, Cymbalta, Wellbutrin and recently Brintellix. Patient denies any history of abuse in the past.   Anxiety: Yes Bipolar Disorder: No Depression: Yes Mania: No Psychosis: No Schizophrenia: No Personality Disorder: No Hospitalization for psychiatric illness: No History of Electroconvulsive Shock Therapy: No Prior Suicide Attempts: No  Medical History; Patient has history of HSV, hypertension, GERD, hemorrhoids and surgical history of tonsillectomy and  cholecystectomy.  She has obesity, fibromyalgia, hypothyroidism and vitamin D deficiency.  Her primary care physician is Dr. Idell Pickles. Patient denies any history of seizures.  Education and Work History; Patient has college education.  She is working as a Agricultural engineer for more than 8 years.  Review of Systems  Skin: Negative for itching and rash.  Neurological: Negative for dizziness and tremors.    Psychiatric: Agitation: No Hallucination: No Depressed Mood: Yes Insomnia: No Hypersomnia: No Altered Concentration: No Feels Worthless: No Grandiose Ideas: No Belief In Special Powers: No New/Increased Substance Abuse: No Compulsions: No  Neurologic: Headache: No Seizure: No Paresthesias: No   Musculoskeletal: Strength & Muscle Tone: within normal limits Gait & Station: normal Patient leans: N/A   Meds ordered this encounter  Medications  . lamoTRIgine (LAMICTAL) 100 MG tablet    Sig: Take 1 tablet (100 mg total) by mouth daily.    Dispense:  30 tablet    Refill:  1    Recent Results (from the past 2160 hour(s))  CBC with Differential/Platelet     Status: None   Collection Time: 01/28/15  4:14 PM  Result Value Ref Range   WBC 8.0 4.0 - 10.5 K/uL   RBC 4.53 3.87 - 5.11 MIL/uL   Hemoglobin 13.0 12.0 - 15.0 g/dL   HCT 39.0 36.0 - 46.0 %   MCV 86.1 78.0 - 100.0 fL   MCH 28.7 26.0 - 34.0 pg   MCHC 33.3 30.0 - 36.0 g/dL   RDW 13.8 11.5 - 15.5 %   Platelets 183 150 - 400 K/uL   MPV 10.9 8.6 - 12.4 fL  Neutrophils Relative % 54 43 - 77 %   Neutro Abs 4.3 1.7 - 7.7 K/uL   Lymphocytes Relative 38 12 - 46 %   Lymphs Abs 3.0 0.7 - 4.0 K/uL   Monocytes Relative 7 3 - 12 %   Monocytes Absolute 0.6 0.1 - 1.0 K/uL   Eosinophils Relative 1 0 - 5 %   Eosinophils Absolute 0.1 0.0 - 0.7 K/uL   Basophils Relative 0 0 - 1 %   Basophils Absolute 0.0 0.0 - 0.1 K/uL   Smear Review Criteria for review not met   BASIC METABOLIC PANEL WITH GFR     Status: None    Collection Time: 01/28/15  4:14 PM  Result Value Ref Range   Sodium 137 135 - 145 mEq/L   Potassium 4.2 3.5 - 5.3 mEq/L   Chloride 105 96 - 112 mEq/L   CO2 25 19 - 32 mEq/L   Glucose, Bld 87 70 - 99 mg/dL   BUN 11 6 - 23 mg/dL   Creat 0.62 0.50 - 1.10 mg/dL   Calcium 9.0 8.4 - 10.5 mg/dL   GFR, Est African American >89 mL/min   GFR, Est Non African American >89 mL/min    Comment:   The estimated GFR is a calculation valid for adults (>=54 years old) that uses the CKD-EPI algorithm to adjust for age and sex. It is   not to be used for children, pregnant women, hospitalized patients,    patients on dialysis, or with rapidly changing kidney function. According to the NKDEP, eGFR >89 is normal, 60-89 shows mild impairment, 30-59 shows moderate impairment, 15-29 shows severe impairment and <15 is ESRD.     TSH     Status: None   Collection Time: 01/28/15  4:14 PM  Result Value Ref Range   TSH 3.881 0.350 - 4.500 uIU/mL  Vit D  25 hydroxy (rtn osteoporosis monitoring)     Status: None   Collection Time: 01/28/15  4:14 PM  Result Value Ref Range   Vit D, 25-Hydroxy 54 30 - 100 ng/mL    Comment: Vitamin D Status           25-OH Vitamin D        Deficiency                <20 ng/mL        Insufficiency         20 - 29 ng/mL        Optimal             > or = 30 ng/mL   For 25-OH Vitamin D testing on patients on D2-supplementation and patients for whom quantitation of D2 and D3 fractions is required, the QuestAssureD 25-OH VIT D, (D2,D3), LC/MS/MS is recommended: order code [phone] (patients > 2 yrs).   Iron and TIBC     Status: Abnormal   Collection Time: 01/28/15  4:14 PM  Result Value Ref Range   Iron 32 (L) 42 - 145 ug/dL   UIBC 337 125 - 400 ug/dL   TIBC 369 250 - 470 ug/dL   %SAT 9 (L) 20 - 55 %  Vitamin B12     Status: None   Collection Time: 01/28/15  4:14 PM  Result Value Ref Range   Vitamin B-12 562 211 - 911 pg/mL  Urinalysis, Routine w reflex microscopic     Status:  None   Collection Time: 01/28/15  4:14 PM  Result Value Ref Range  Color, Urine YELLOW YELLOW   APPearance CLEAR CLEAR   Specific Gravity, Urine 1.010 1.005 - 1.030   pH 6.5 5.0 - 8.0   Glucose, UA NEG NEG mg/dL   Bilirubin Urine NEG NEG   Ketones, ur NEG NEG mg/dL   Hgb urine dipstick NEG NEG   Protein, ur NEG NEG mg/dL   Urobilinogen, UA 0.2 0.0 - 1.0 mg/dL   Nitrite NEG NEG   Leukocytes, UA NEG NEG  Urine culture     Status: None   Collection Time: 03/03/15  4:40 PM  Result Value Ref Range   Colony Count NO GROWTH    Organism ID, Bacteria NO GROWTH   Hepatic function panel     Status: None   Collection Time: 03/03/15  4:55 PM  Result Value Ref Range   Total Bilirubin 0.3 0.2 - 1.2 mg/dL   Bilirubin, Direct <0.1 0.0 - 0.3 mg/dL   Indirect Bilirubin NOT CALC 0.2 - 1.2 mg/dL   Alkaline Phosphatase 48 39 - 117 U/L   AST 12 0 - 37 U/L   ALT 10 0 - 35 U/L   Total Protein 6.8 6.0 - 8.3 g/dL   Albumin 4.2 3.5 - 5.2 g/dL  Lipid panel     Status: Abnormal   Collection Time: 03/03/15  4:55 PM  Result Value Ref Range   Cholesterol 192 0 - 200 mg/dL    Comment: ATP III Classification:       < 200        mg/dL        Desirable      200 - 239     mg/dL        Borderline High      >= 240        mg/dL        High      Triglycerides 147 <150 mg/dL   HDL 43 (L) >=46 mg/dL    Comment: ** Please note change in reference range(s). **   Total CHOL/HDL Ratio 4.5 Ratio   VLDL 29 0 - 40 mg/dL   LDL Cholesterol 120 (H) 0 - 99 mg/dL    Comment:   Total Cholesterol/HDL Ratio:CHD Risk                        Coronary Heart Disease Risk Table                                        Men       Women          1/2 Average Risk              3.4        3.3              Average Risk              5.0        4.4           2X Average Risk              9.6        7.1           3X Average Risk             23.4       11.0 Use the calculated Patient Ratio above and the  CHD Risk table  to determine the  patient's CHD Risk. ATP III Classification (LDL):       < 100        mg/dL         Optimal      100 - 129     mg/dL         Near or Above Optimal      130 - 159     mg/dL         Borderline High      160 - 189     mg/dL         High       > 190        mg/dL         Very High     BASIC METABOLIC PANEL WITH GFR     Status: None   Collection Time: 03/03/15  4:55 PM  Result Value Ref Range   Sodium 138 135 - 145 mEq/L   Potassium 4.0 3.5 - 5.3 mEq/L   Chloride 101 96 - 112 mEq/L   CO2 27 19 - 32 mEq/L   Glucose, Bld 86 70 - 99 mg/dL   BUN 11 6 - 23 mg/dL   Creat 0.59 0.50 - 1.10 mg/dL   Calcium 8.9 8.4 - 10.5 mg/dL   GFR, Est African American >89 mL/min   GFR, Est Non African American >89 mL/min    Comment:   The estimated GFR is a calculation valid for adults (>=76 years old) that uses the CKD-EPI algorithm to adjust for age and sex. It is   not to be used for children, pregnant women, hospitalized patients,    patients on dialysis, or with rapidly changing kidney function. According to the NKDEP, eGFR >89 is normal, 60-89 shows mild impairment, 30-59 shows moderate impairment, 15-29 shows severe impairment and <15 is ESRD.     Insulin, fasting     Status: None   Collection Time: 03/03/15  4:55 PM  Result Value Ref Range   Insulin fasting, serum 6.1 2.0 - 19.6 uIU/mL    Comment:   This insulin assay shows strong cross-reactivity for some insulin analogs (lispro, aspart, and glargine) and much lower cross-reactivity with others (detemir, glulisine).   Stimulated Insulin reference intervals were established using the Siemens Immulite assay. These values are provided for general guidance only.   Hemoglobin A1c     Status: Abnormal   Collection Time: 03/03/15  4:55 PM  Result Value Ref Range   Hgb A1c MFr Bld 5.7 (H) <5.7 %    Comment:                                                                        According to the ADA Clinical Practice Recommendations for 2011,  when HbA1c is used as a screening test:     >=6.5%   Diagnostic of Diabetes Mellitus            (if abnormal result is confirmed)   5.7-6.4%   Increased risk of developing Diabetes Mellitus   References:Diagnosis and Classification of Diabetes Mellitus,Diabetes WUJW,1191,47(WGNFA 1):S62-S69 and Standards of Medical Care in         Diabetes - 2011,Diabetes  OTLX,7262,03 (Suppl 1):S11-S61.      Mean Plasma Glucose 117 (H) <117 mg/dL  Vit D  25 hydroxy (rtn osteoporosis monitoring)     Status: None   Collection Time: 03/03/15  4:55 PM  Result Value Ref Range   Vit D, 25-Hydroxy 56 30 - 100 ng/mL    Comment: Vitamin D Status           25-OH Vitamin D        Deficiency                <20 ng/mL        Insufficiency         20 - 29 ng/mL        Optimal             > or = 30 ng/mL   For 25-OH Vitamin D testing on patients on D2-supplementation and patients for whom quantitation of D2 and D3 fractions is required, the QuestAssureD 25-OH VIT D, (D2,D3), LC/MS/MS is recommended: order code [phone] (patients > 2 yrs).   Magnesium     Status: None   Collection Time: 03/03/15  4:55 PM  Result Value Ref Range   Magnesium 2.0 1.5 - 2.5 mg/dL  Urinalysis, Routine w reflex microscopic     Status: Abnormal   Collection Time: 03/03/15  4:55 PM  Result Value Ref Range   Color, Urine YELLOW YELLOW   APPearance CLEAR CLEAR   Specific Gravity, Urine 1.009 1.005 - 1.030   pH 6.5 5.0 - 8.0   Glucose, UA NEG NEG mg/dL   Bilirubin Urine NEG NEG   Ketones, ur NEG NEG mg/dL   Hgb urine dipstick LARGE (A) NEG   Protein, ur NEG NEG mg/dL   Urobilinogen, UA 0.2 0.0 - 1.0 mg/dL   Nitrite NEG NEG   Leukocytes, UA SMALL (A) NEG  Vitamin B12     Status: Abnormal   Collection Time: 03/03/15  4:55 PM  Result Value Ref Range   Vitamin B-12 1049 (H) 211 - 911 pg/mL  Iron and TIBC     Status: Abnormal   Collection Time: 03/03/15  4:55 PM  Result Value Ref Range   Iron 30 (L) 42 - 145 ug/dL   UIBC 332 125 -  400 ug/dL   TIBC 362 250 - 470 ug/dL   %SAT 8 (L) 20 - 55 %  Folate RBC     Status: None   Collection Time: 03/03/15  4:55 PM  Result Value Ref Range   RBC Folate 832 >280 ng/mL    Comment: Reference range not established for pediatric patients.  Urine Microscopic     Status: Abnormal   Collection Time: 03/03/15  4:55 PM  Result Value Ref Range   Squamous Epithelial / LPF NONE SEEN RARE   Crystals NONE SEEN NONE SEEN   Casts NONE SEEN NONE SEEN   WBC, UA 0-2 <3 WBC/hpf   RBC / HPF 3-6 (A) <3 RBC/hpf   Bacteria, UA NONE SEEN RARE  Urinalysis, Reflex Microscopic     Status: None   Collection Time: 04/08/15  2:52 PM  Result Value Ref Range   Color, Urine YELLOW YELLOW   APPearance CLEAR CLEAR   Specific Gravity, Urine 1.008 1.005 - 1.030   pH 5.5 5.0 - 8.0   Glucose, UA NEG NEG mg/dL   Bilirubin Urine NEG NEG   Ketones, ur NEG NEG mg/dL   Hgb urine dipstick NEG NEG   Protein, ur NEG  NEG mg/dL   Urobilinogen, UA 0.2 0.0 - 1.0 mg/dL   Nitrite NEG NEG   Leukocytes, UA NEG NEG  Culture, Urine     Status: None   Collection Time: 04/08/15  2:52 PM  Result Value Ref Range   Colony Count 10,000 COLONIES/ML    Organism ID, Bacteria GROUP B STREP (S.AGALACTIAE) ISOLATED     Comment: Testing against S. agalactiae not routinely performed due to predictability of AMP/PEN/VAN susceptibility.       Constitutional:  BP 119/83 mmHg  Pulse 76  Ht 5' 1"  (1.549 m)  Wt 172 lb (78.019 kg)  BMI 32.52 kg/m2   Mental Status Examination;  Patient is well groomed well dressed female who appears to be in her stated age.  She is pleasant and cooperative.  She maintained fair eye contact.  Her speech is soft, clear, coherent with normal tone and volume.  She described her mood anxious and depressed.  Her affect is constricted.   Her thought process slow but logical and goal-directed.  There were no flight of ideas or any loose association.  Her psychomotor activity is slow.  Her fund of  knowledge is good.  She denies any auditory or visual hallucination.  She denies any active or passive suicidal thoughts or homicidal thought.  There were no delusions, paranoia or any obsessive thoughts.  Her cognition is good.  She's alert and oriented 3.  There were no tremors or shakes.  Her insight judgment and impulse control is okay.   Established Problem, Stable/Improving (1), Review of Psycho-Social Stressors (1), Review of Last Therapy Session (1), Review of Medication Regimen & Side Effects (2) and Review of New Medication or Change in Dosage (2)  Assessment: Axis I: Major depressive disorder, recurrent  Axis II: Deferred  Axis III:  Past Medical History  Diagnosis Date  . Depression   . Hypothyroidism   . HSV-1 infection   . Fibromyalgia   . Preeclampsia   . Hypertension   . Anxiety   . Hemorrhoids   . Constipation   . GERD (gastroesophageal reflux disease)   . Other abnormal glucose   . Anemia      Plan:  Patient is doing better on her current medication.  I will continue Prozac 40 mg daily .  I also suggested to increase Lamictal 100 mg to help with residual mood lability and depression.  She has no rash or itching.  She is seeing therapist Lovena Le for counseling.  She is getting Xanax 0.25 mg at bedtime from her primary care physician.  Discussed medication side effects and benefits.  Recommended to call us back if she has any question or any concern.  Follow-up in 2 months.   Ranyah Groeneveld T., MD 04/22/2015

## 2015-05-26 ENCOUNTER — Other Ambulatory Visit: Payer: Self-pay | Admitting: Internal Medicine

## 2015-05-27 ENCOUNTER — Other Ambulatory Visit (HOSPITAL_COMMUNITY): Payer: Self-pay | Admitting: Psychiatry

## 2015-05-27 DIAGNOSIS — F331 Major depressive disorder, recurrent, moderate: Secondary | ICD-10-CM

## 2015-05-27 NOTE — Telephone Encounter (Signed)
Met with Dr. Lovena Le to review refill request for patient's Prozac 105m, one a day as this was continued by Dr. AAdele Schilderfrom note 04/22/15 but had not been previously ordered by Dr. AAdele Schilder  Dr. TLovena Leauthorized a one time refill as patient is to return to see Dr. AAdele Schilderon 06/22/15.  [facility] [address]

## 2015-06-02 ENCOUNTER — Ambulatory Visit (INDEPENDENT_AMBULATORY_CARE_PROVIDER_SITE_OTHER): Payer: BC Managed Care – PPO | Admitting: Physician Assistant

## 2015-06-02 VITALS — BP 112/78 | HR 68 | Temp 98.1°F | Resp 16 | Ht 61.0 in | Wt 174.0 lb

## 2015-06-02 DIAGNOSIS — K2100 Gastro-esophageal reflux disease with esophagitis, without bleeding: Secondary | ICD-10-CM

## 2015-06-02 DIAGNOSIS — F329 Major depressive disorder, single episode, unspecified: Secondary | ICD-10-CM

## 2015-06-02 DIAGNOSIS — E785 Hyperlipidemia, unspecified: Secondary | ICD-10-CM | POA: Insufficient documentation

## 2015-06-02 DIAGNOSIS — E039 Hypothyroidism, unspecified: Secondary | ICD-10-CM

## 2015-06-02 DIAGNOSIS — E559 Vitamin D deficiency, unspecified: Secondary | ICD-10-CM

## 2015-06-02 DIAGNOSIS — Z79899 Other long term (current) drug therapy: Secondary | ICD-10-CM

## 2015-06-02 DIAGNOSIS — R7303 Prediabetes: Secondary | ICD-10-CM

## 2015-06-02 DIAGNOSIS — R5383 Other fatigue: Secondary | ICD-10-CM

## 2015-06-02 DIAGNOSIS — E669 Obesity, unspecified: Secondary | ICD-10-CM

## 2015-06-02 DIAGNOSIS — R7309 Other abnormal glucose: Secondary | ICD-10-CM | POA: Insufficient documentation

## 2015-06-02 DIAGNOSIS — I10 Essential (primary) hypertension: Secondary | ICD-10-CM

## 2015-06-02 DIAGNOSIS — F32A Depression, unspecified: Secondary | ICD-10-CM

## 2015-06-02 LAB — CBC WITH DIFFERENTIAL/PLATELET
BASOS ABS: 0 10*3/uL (ref 0.0–0.1)
Basophils Relative: 0 % (ref 0–1)
EOS ABS: 0.1 10*3/uL (ref 0.0–0.7)
EOS PCT: 1 % (ref 0–5)
HEMATOCRIT: 41.5 % (ref 36.0–46.0)
Hemoglobin: 13.7 g/dL (ref 12.0–15.0)
LYMPHS ABS: 2.3 10*3/uL (ref 0.7–4.0)
LYMPHS PCT: 28 % (ref 12–46)
MCH: 28.8 pg (ref 26.0–34.0)
MCHC: 33 g/dL (ref 30.0–36.0)
MCV: 87.4 fL (ref 78.0–100.0)
MONO ABS: 0.7 10*3/uL (ref 0.1–1.0)
MPV: 10.6 fL (ref 8.6–12.4)
Monocytes Relative: 8 % (ref 3–12)
Neutro Abs: 5.2 10*3/uL (ref 1.7–7.7)
Neutrophils Relative %: 63 % (ref 43–77)
Platelets: 192 10*3/uL (ref 150–400)
RBC: 4.75 MIL/uL (ref 3.87–5.11)
RDW: 13.3 % (ref 11.5–15.5)
WBC: 8.2 10*3/uL (ref 4.0–10.5)

## 2015-06-02 MED ORDER — RANITIDINE HCL 300 MG PO TABS: 300.0000 mg | ORAL_TABLET | Freq: Every day | ORAL | Status: DC

## 2015-06-02 NOTE — Patient Instructions (Addendum)
Take zantac 150-300 mg at night for 2 weeks, then you can stop.  Avoid alcohol, spicy foods, NSAIDS (aleve, ibuprofen) at this time. See foods below.   Food Choices for Gastroesophageal Reflux Disease When you have gastroesophageal reflux disease (GERD), the foods you eat and your eating habits are very important. Choosing the right foods can help ease the discomfort of GERD. WHAT GENERAL GUIDELINES DO I NEED TO FOLLOW?  Choose fruits, vegetables, whole grains, low-fat dairy products, and low-fat meat, fish, and poultry.  Limit fats such as oils, salad dressings, butter, nuts, and avocado.  Keep a food diary to identify foods that cause symptoms.  Avoid foods that cause reflux. These may be different for different people.  Eat frequent small meals instead of three large meals each day.  Eat your meals slowly, in a relaxed setting.  Limit fried foods.  Cook foods using methods other than frying.  Avoid drinking alcohol.  Avoid drinking large amounts of liquids with your meals.  Avoid bending over or lying down until 2-3 hours after eating. WHAT FOODS ARE NOT RECOMMENDED? The following are some foods and drinks that may worsen your symptoms: Vegetables Tomatoes. Tomato juice. Tomato and spaghetti sauce. Chili peppers. Onion and garlic. Horseradish. Fruits Oranges, grapefruit, and lemon (fruit and juice). Meats High-fat meats, fish, and poultry. This includes hot dogs, ribs, ham, sausage, salami, and bacon. Dairy Whole milk and chocolate milk. Sour cream. Cream. Butter. Ice cream. Cream cheese.  Beverages Coffee and tea, with or without caffeine. Carbonated beverages or energy drinks. Condiments Hot sauce. Barbecue sauce.  Sweets/Desserts Chocolate and cocoa. Donuts. Peppermint and spearmint. Fats and Oils High-fat foods, including [place] fries and potato chips. Other Vinegar. Strong spices, such as black pepper, white pepper, red pepper, cayenne, curry powder, cloves,  ginger, and chili powder.  We want weight loss that will last so you should lose 1-2 pounds a week.  THAT IS IT! Please pick THREE things a month to change. Once it is a habit check off the item. Then pick another three items off the list to become habits.  If you are already doing a habit on the list GREAT!  Cross that item off! o Don't drink your calories. Ie, alcohol, soda, fruit juice, and sweet tea.  o Drink more water. Drink a glass when you feel hungry or before each meal.  o Eat breakfast - Complex carb and protein (likeDannon light and fit yogurt, oatmeal, fruit, eggs, [place] bacon). o Measure your cereal.  Eat no more than one cup a day. (ie [place]) o Eat an apple a day. o Add a vegetable a day. o Try a new vegetable a month. o Use Pam! Stop using oil or butter to cook. o Don't finish your plate or use smaller plates. o Share your dessert. o Eat sugar free Jello for dessert or frozen grapes. o Don't eat 2-3 hours before bed. o Switch to whole wheat bread, pasta, and brown rice. o Make healthier choices when you eat out. No fries! o Pick baked chicken, NOT fried. o Don't forget to SLOW DOWN when you eat. It is not going anywhere.  o Take the stairs. o Park far away in the parking lot o State Farm[employer]ift soup cans (or weights) for 10 minutes while watching TV. o Walk at work for 10 minutes during break. o Walk outside 1 time a week with your friend, kids, dog, or significant other. o Start a walking group at church. o Walk the mall as much  as you can tolerate.  o Keep a food diary. o Weigh yourself daily. o Walk for 15 minutes 3 days per week. o Cook at home more often and eat out less.  If life happens and you go back to old habits, it is okay.  Just start over. You can do it!   If you experience chest pain, get short of breath, or tired during the exercise, please stop immediately and inform your doctor.   Before you even begin to attack a weight-loss plan, it pays to remember this: You  are not fat. You have fat. Losing weight isn't about blame or shame; it's simply another achievement to accomplish. Dieting is like any other skill-you have to buckle down and work at it. As long as you act in a smart, reasonable way, you'll ultimately get where you want to be. Here are some weight loss pearls for you.  1. It's Not a Diet. It's a Lifestyle Thinking of a diet as something you're on and suffering through only for the short term doesn't work. To shed weight and keep it off, you need to make permanent changes to the way you eat. It's OK to indulge occasionally, of course, but if you cut calories temporarily and then revert to your old way of eating, you'll gain back the weight quicker than you can say yo-yo. Use it to lose it. Research shows that one of the best predictors of long-term weight loss is how many pounds you drop in the first month. For that reason, nutritionists often suggest being stricter for the first two weeks of your new eating strategy to build momentum. Cut out added sugar and alcohol and avoid unrefined carbs. After that, figure out how you can reincorporate them in a way that's healthy and maintainable.  2. There's a Right Way to Exercise Working out burns calories and fat and boosts your metabolism by building muscle. But those trying to lose weight are notorious for overestimating the number of calories they burn and underestimating the amount they take in. Unfortunately, your system is biologically programmed to hold on to extra pounds and that means when you start exercising, your body senses the deficit and ramps up its hunger signals. If you're not diligent, you'll eat everything you burn and then some. Use it to lose it. Cardio gets all the exercise glory, but strength and interval training are the real heroes. They help you build lean muscle, which in turn increases your metabolism and calorie-burning ability 3. Don't Overreact to Mild Hunger Some people have a hard  time losing weight because of hunger anxiety. To them, being hungry is bad-something to be avoided at all costs-so they carry snacks with them and eat when they don't need to. Others eat because they're stressed out or bored. While you never want to get to the point of being ravenous (that's when bingeing is likely to happen), a hunger pang, a craving, or the fact that it's 3:00 p.m. should not send you racing for the vending machine or obsessing about the energy bar in your purse. Ideally, you should put off eating until your stomach is growling and it's difficult to concentrate.  Use it to lose it. When you feel the urge to eat, use the HALT method. Ask yourself, Am I really hungry? Or am I angry or anxious, lonely or bored, or tired? If you're still not certain, try the apple test. If you're truly hungry, an apple should seem delicious; if it doesn't, something  else is going on. Or you can try drinking water and making yourself busy, if you are still hungry try a healthy snack.  4. Not All Calories Are Created Equal The mechanics of weight loss are pretty simple: Take in fewer calories than you use for energy. But the kind of food you eat makes all the difference. Processed food that's high in saturated fat and refined starch or sugar can cause inflammation that disrupts the hormone signals that tell your brain you're full. The result: You eat a lot more.  Use it to lose it. Clean up your diet. Swap in whole, unprocessed foods, including vegetables, lean protein, and healthy fats that will fill you up and give you the biggest nutritional bang for your calorie buck. In a few weeks, as your brain starts receiving regular hunger and fullness signals once again, you'll notice that you feel less hungry overall and naturally start cutting back on the amount you eat.  5. Protein, Produce, and Plant-Based Fats Are Your Weight-Loss Trinity Here's why eating the three Ps regularly will help you drop pounds. Protein  fills you up. You need it to build lean muscle, which keeps your metabolism humming so that you can torch more fat. People in a weight-loss program who ate double the recommended daily allowance for protein (about 110 grams for a 150-pound woman) lost 70 percent of their weight from fat, while people who ate the RDA lost only about 40 percent, one study found. Produce is packed with filling fiber. "It's very difficult to consume too many calories if you're eating a lot of vegetables. Example: Three cups of broccoli is a lot of food, yet only 93 calories. (Fruit is another story. It can be easy to overeat and can contain a lot of calories from sugar, so be sure to monitor your intake.) Plant-based fats like olive oil and those in avocados and nuts are healthy and extra satiating.  Use it to lose it. Aim to incorporate each of the three Ps into every meal and snack. People who eat protein throughout the day are able to keep weight off, according to a study in the American Journal of Clinical Nutrition. In addition to meat, poultry and seafood, good sources are beans, lentils, eggs, tofu, and yogurt. As for fat, keep portion sizes in check by measuring out salad dressing, oil, and nut butters (shoot for one to two tablespoons). Finally, eat veggies or a little fruit at every meal. People who did that consumed 308 fewer calories but didn't feel any hungrier than when they didn't eat more produce.  7. How You Eat Is As Important As What You Eat In order for your brain to register that you're full, you need to focus on what you're eating. Sit down whenever you eat, preferably at a table. Turn off the TV or computer, put down your phone, and look at your food. Smell it. Chew slowly, and don't put another bite on your fork until you swallow. When women ate lunch this attentively, they consumed 30 percent less when snacking later than those who listened to an audiobook at lunchtime, according to a study in the [place]  Journal of Nutrition. 8. Weighing Yourself Really Works The scale provides the best evidence about whether your efforts are paying off. Seeing the numbers tick up or down or stagnate is motivation to keep going-or to rethink your approach. A 2015 study at [facility] found that daily weigh-ins helped people lose more weight, keep it off,  and maintain that loss, even after two years. Use it to lose it. Step on the scale at the same time every day for the best results. If your weight shoots up several pounds from one weigh-in to the next, don't freak out. Eating a lot of salt the night before or having your period is the likely culprit. The number should return to normal in a day or two. It's a steady climb that you need to do something about. 9. Too Much Stress and Too Little Sleep Are Your Enemies When you're tired and frazzled, your body cranks up the production of cortisol, the stress hormone that can cause carb cravings. Not getting enough sleep also boosts your levels of ghrelin, a hormone associated with hunger, while suppressing leptin, a hormone that signals fullness and satiety. People on a diet who slept only five and a half hours a night for two weeks lost 55 percent less fat and were hungrier than those who slept eight and a half hours, according to a study in the [place] Medical Association Journal. Use it to lose it. Prioritize sleep, aiming for seven hours or more a night, which research shows helps lower stress. And make sure you're getting quality zzz's. If a snoring spouse or a fidgety cat wakes you up frequently throughout the night, you may end up getting the equivalent of just four hours of sleep, according to a study from [facility]. Keep pets out of the bedroom, and use a white-noise app to drown out snoring. 10. You Will Hit a plateau-And You Can Bust Through It As you slim down, your body releases much less leptin, the fullness hormone.  If you're not strength  training, start right now. Building muscle can raise your metabolism to help you overcome a plateau. To keep your body challenged and burning calories, incorporate new moves and more intense intervals into your workouts or add another sweat session to your weekly routine. Alternatively, cut an extra 100 calories or so a day from your diet. Now that you've lost weight, your body simply doesn't need as much fuel.

## 2015-06-02 NOTE — Progress Notes (Signed)
Assessment and Plan:  1. Essential hypertension - continue medications, DASH diet, exercise and monitor at home. Call if greater than 130/80.  - CBC with Differential/Platelet - BASIC METABOLIC PANEL WITH GFR - Hepatic function panel  2. Hypothyroidism, unspecified hypothyroidism type Goal around 2, may need to adjust - TSH  3. Prediabetes Discussed general issues about diabetes pathophysiology and management., Educational material distributed., Suggested low cholesterol diet., Encouraged aerobic exercise., Discussed foot care., Reminded to get yearly retinal exam. - Hemoglobin A1c - Insulin, fasting  4. Hyperlipidemia LDL goal <100 -continue medications, check lipids, decrease fatty foods, increase activity.  - Lipid panel  5. Depression Continue follow up and meds  6. Obese Obesity with co morbidities- long discussion about weight loss, diet, and exercise  7. Gastroesophageal reflux disease with esophagitis Stop wine, may need to adust lamictal/follow up with GI - ranitidine (ZANTAC) 300 MG tablet; Take 1 tablet (300 mg total) by mouth at bedtime.  Dispense: 30 tablet; Refill: 1  8. Medication management - Magnesium - Lamotrigine level  9. Vitamin D deficiency - Vit D  25 hydroxy (rtn osteoporosis monitoring)  10. Other fatigue ? Depression, will check labs to rule out other causes, want thyroid closer to 2 - Iron and TIBC - Ferritin - Vitamin B12 - Lamotrigine level    Continue diet and meds as discussed. Further disposition pending results of labs.  HPI 34 y.o. female  presents for 3 month follow up with hypertension, hyperlipidemia, prediabetes and vitamin D. 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 2 weeks ago while in bed and eating blueberries, had chest  discomfort with burping. She has had is again with food, burping, also had once while walking with chest tightness and walking. She has had some wine but denies NSAID use, just had recent increase in lamictal.  She denies shortness of breath, dizziness.  She is on cholesterol medication and denies myalgias. Her cholesterol is at goal. The cholesterol last visit was:   Lab Results  Component Value Date   CHOL 192 03/03/2015   HDL 43* 03/03/2015   LDLCALC 120* 03/03/2015   LDLDIRECT 114.9 04/30/2012   TRIG 147 03/03/2015   CHOLHDL 4.5 03/03/2015   Last U0AA1C in the office was:  Lab Results  Component Value Date   HGBA1C 5.7* 03/03/2015   Patient is on Vitamin D supplement, it was decreased to 4000 units last visit.   Lab Results  Component Value Date   VD25OH 56 03/03/2015     She is on thyroid medication. Her medication was not changed last visit. Patient denies nervousness, palpitations and weight changes.  Lab Results  Component Value Date   TSH 3.881 01/28/2015   She has depression and is following with psych, Dr. Lolly MustacheArfeen, on prozac 40, lamictal 100, and xanax PRN. Follows with Graylon GoodKim Rachlin for counseling.   BMI is Body mass index is 32.89 kg/(m^2)., she is working on diet and exercise. Wt Readings from Last 3 Encounters:  06/02/15 174 lb (78.926 kg)  04/22/15 172 lb (78.019 kg)  03/23/15 172 lb (78.019 kg)   Current Medications:  Current Outpatient Prescriptions on File Prior to Visit  Medication Sig Dispense Refill  . ALPRAZolam (XANAX) 1 MG tablet TAKE 1/2-1 TABLET BY MOUTH TWICE A DAY  AS NEEDED FOR ANXIETY. APPT. DR Celene Kras 60 tablet 0  . amoxicillin (AMOXIL) 250 MG capsule Take 1 capsule 3 x day after meals for Cystitis 30 capsule 0  . Cholecalciferol (VITAMIN D) 2000 UNITS CAPS Take 8,000 Units by mouth daily.     . diphenhydrAMINE (BENADRYL) 25 MG tablet Take 25 mg by mouth every 6 (six) hours as needed.    Marland Kitchen FLUoxetine (PROZAC) 40 MG capsule   0  . FLUoxetine (PROZAC) 40  MG capsule TAKE 1 CAPSULE (40 MG TOTAL) BY MOUTH DAILY. 30 capsule 0  . ibuprofen (ADVIL,MOTRIN) 200 MG tablet Take 200 mg by mouth every 6 (six) hours as needed for pain. Takes 4 tablets (800 mg) every 4-6 hrs prn    . lamoTRIgine (LAMICTAL) 100 MG tablet Take 1 tablet (100 mg total) by mouth daily. 30 tablet 1  . magnesium 30 MG tablet Take 250 mg by mouth 1 day or 1 dose.    . metroNIDAZOLE (METROGEL) 0.75 % vaginal gel   2  . polyethylene glycol (MIRALAX / GLYCOLAX) packet Take 17 g by mouth daily.    . pravastatin (PRAVACHOL) 40 MG tablet Take 1 tablet (40 mg total) by mouth daily. 90 tablet 1  . SYNTHROID 100 MCG tablet TAKE 1 TO 1&1/2 TABLETS DAILY AS DIRECTED FOR THYROID 145 tablet 0   No current facility-administered medications on file prior to visit.   Medical History:  Past Medical History  Diagnosis Date  . Depression   . Hypothyroidism   . HSV-1 infection   . Fibromyalgia   . Preeclampsia   . Hypertension   . Anxiety   . Hemorrhoids   . Constipation   . GERD (gastroesophageal reflux disease)   . Other abnormal glucose   . Anemia    Allergies:  Allergies  Allergen Reactions  . Doxycycline Other (See Comments)    GI UPSET  . Effexor [Venlafaxine] Other (See Comments)    DYSPHORIA  . Prednisone Other (See Comments)    FLUSHED  . Savella [Milnacipran Hcl] Other (See Comments)    NO RELIEF    ROS   Family history- Review and unchanged Social history- Review and unchanged Physical Exam: BP 112/78 mmHg  Pulse 68  Temp(Src) 98.1 F (36.7 C)  Resp 16  Ht  (1.549 m)  Wt 174 lb (78.926 kg)  BMI 32.89 kg/m2 Wt Readings from Last 3 Encounters:  06/02/15 174 lb (78.926 kg)  04/22/15 172 lb (78.019 kg)  03/23/15 172 lb (78.019 kg)   General Appearance: Well nourished, in no apparent distress. Eyes: PERRLA, EOMs, conjunctiva no swelling or erythema Sinuses: No Frontal/maxillary tenderness ENT/Mouth: Ext aud canals clear, TMs without erythema, bulging.  No erythema, swelling, or exudate on post pharynx.  Tonsils not swollen or erythematous. Hearing normal.  Neck: Supple, thyroid normal.  Respiratory: Respiratory effort normal, BS equal bilaterally without rales, rhonchi, wheezing or stridor.  Cardio: RRR with no MRGs. Brisk peripheral pulses without edema.  Abdomen: Soft, + BS.  Non tender, no guarding, rebound, hernias, masses. Lymphatics: Non tender without lymphadenopathy.  Musculoskeletal: Full ROM, 5/5 strength, normal gait.  Skin: Warm, dry without rashes, lesions, ecchymosis.  Neuro: Cranial nerves intact. Normal muscle tone, no cerebellar symptoms. Sensation intact.  Psych: Awake and oriented X 3, normal affect, Insight and Judgment appropriate.    Quentin Mulling 11:40 AM

## 2015-06-03 LAB — MAGNESIUM: Magnesium: 1.9 mg/dL (ref 1.5–2.5)

## 2015-06-03 LAB — HEPATIC FUNCTION PANEL
ALBUMIN: 4.3 g/dL (ref 3.5–5.2)
ALK PHOS: 51 U/L (ref 39–117)
ALT: 15 U/L (ref 0–35)
AST: 16 U/L (ref 0–37)
BILIRUBIN DIRECT: 0.1 mg/dL (ref 0.0–0.3)
Indirect Bilirubin: 0.4 mg/dL (ref 0.2–1.2)
TOTAL PROTEIN: 7.1 g/dL (ref 6.0–8.3)
Total Bilirubin: 0.5 mg/dL (ref 0.2–1.2)

## 2015-06-03 LAB — LIPID PANEL
Cholesterol: 185 mg/dL (ref 0–200)
HDL: 52 mg/dL (ref >=46)
LDL Cholesterol: 99 mg/dL (ref 0–99)
TRIGLYCERIDES: 171 mg/dL — AB (ref <150)
Total CHOL/HDL Ratio: 3.6 Ratio
VLDL: 34 mg/dL (ref 0–40)

## 2015-06-03 LAB — IRON AND TIBC
%SAT: 20 % (ref 20–55)
Iron: 71 ug/dL (ref 42–145)
TIBC: 361 ug/dL (ref 250–470)
UIBC: 290 ug/dL (ref 125–400)

## 2015-06-03 LAB — BASIC METABOLIC PANEL WITH GFR
BUN: 12 mg/dL (ref 6–23)
CALCIUM: 9.6 mg/dL (ref 8.4–10.5)
CHLORIDE: 102 meq/L (ref 96–112)
CO2: 27 mEq/L (ref 19–32)
CREATININE: 0.66 mg/dL (ref 0.50–1.10)
GFR, Est African American: >89 mL/min
Glucose, Bld: 78 mg/dL (ref 70–99)
POTASSIUM: 4.3 meq/L (ref 3.5–5.3)
Sodium: 139 mEq/L (ref 135–145)

## 2015-06-03 LAB — TSH: TSH: 3.516 u[IU]/mL (ref 0.350–4.500)

## 2015-06-03 LAB — HEMOGLOBIN A1C
HEMOGLOBIN A1C: 5.6 % (ref <5.7)
Mean Plasma Glucose: 114 mg/dL (ref <117)

## 2015-06-03 LAB — VITAMIN D 25 HYDROXY (VIT D DEFICIENCY, FRACTURES): Vit D, 25-Hydroxy: 59 ng/mL (ref 30–100)

## 2015-06-03 LAB — INSULIN, FASTING: INSULIN FASTING, SERUM: 6.8 u[IU]/mL (ref 2.0–19.6)

## 2015-06-03 LAB — FERRITIN: FERRITIN: 27 ng/mL (ref 10–291)

## 2015-06-03 LAB — VITAMIN B12: VITAMIN B 12: 1057 pg/mL — AB (ref 211–911)

## 2015-06-03 NOTE — Addendum Note (Signed)
Addended by: Doree AlbeeOLLIER, Advait Buice R on: 06/03/2015 08:43 AM   Modules accepted: Orders

## 2015-06-05 LAB — LAMOTRIGINE LEVEL: LAMOTRIGINE LVL: 2.4 ug/mL — AB (ref 4.0–18.0)

## 2015-06-20 ENCOUNTER — Other Ambulatory Visit: Payer: Self-pay | Admitting: Advanced Practice Midwife

## 2015-06-22 ENCOUNTER — Ambulatory Visit (HOSPITAL_COMMUNITY): Payer: Self-pay | Admitting: Psychiatry

## 2015-06-29 ENCOUNTER — Other Ambulatory Visit (HOSPITAL_COMMUNITY): Payer: Self-pay | Admitting: Psychiatry

## 2015-06-29 DIAGNOSIS — F331 Major depressive disorder, recurrent, moderate: Secondary | ICD-10-CM

## 2015-06-30 ENCOUNTER — Ambulatory Visit: Payer: Self-pay | Admitting: Physician Assistant

## 2015-06-30 MED ORDER — FLUOXETINE HCL 40 MG PO CAPS: ORAL_CAPSULE | ORAL | Status: DC

## 2015-06-30 NOTE — Telephone Encounter (Signed)
Met with Dr. Adele Schilder who authorized a one time refill of patient's prescribed Prozac and Lamictal after call from [facility] and patient has been moved twice for appointments from 06/22/15 to 07/06/15 and then to 07/22/15 due to provider not available.  Called patient to inform refills were authorized and e[facility]scribed into [facility] as patient denied any current symptoms or problems with medications and agreed to keep appointment on 07/22/15.  Patient to call if any problems prior to then.

## 2015-07-01 ENCOUNTER — Ambulatory Visit (INDEPENDENT_AMBULATORY_CARE_PROVIDER_SITE_OTHER): Payer: BC Managed Care – PPO | Admitting: Physician Assistant

## 2015-07-01 ENCOUNTER — Encounter: Payer: Self-pay | Admitting: Physician Assistant

## 2015-07-01 VITALS — BP 126/78 | HR 72 | Temp 97.9°F | Resp 16 | Ht 61.0 in | Wt 175.2 lb

## 2015-07-01 DIAGNOSIS — E039 Hypothyroidism, unspecified: Secondary | ICD-10-CM | POA: Diagnosis not present

## 2015-07-01 DIAGNOSIS — N898 Other specified noninflammatory disorders of vagina: Secondary | ICD-10-CM

## 2015-07-01 DIAGNOSIS — R350 Frequency of micturition: Secondary | ICD-10-CM

## 2015-07-01 NOTE — Progress Notes (Signed)
HPI: complains of UTI symptoms Onset 2-4 weeks days ago, progressively worse small volume voiding with increased frequency, itching, green/discharge denies hematuria, flank pain, dysuria or fever The patient has a history of prior UTI  She also complained of fatigue last time, stress, and worsening memory. Her thyroid pills was 1 pill daily but 1.5 pills on Friday, Sunday.  Lab Results  Component Value Date   TSH 3.516 06/02/2015    Current Outpatient Prescriptions on File Prior to Visit  Medication Sig Dispense Refill  . ALPRAZolam (XANAX) 1 MG tablet TAKE 1/2-1 TABLET BY MOUTH TWICE A DAY AS NEEDED FOR ANXIETY. APPT. DR AFEEN 60 tablet 0  . Cholecalciferol (VITAMIN D) 2000 UNITS CAPS Take 8,000 Units by mouth daily.     . diphenhydrAMINE (BENADRYL) 25 MG tablet Take 25 mg by mouth every 6 (six) hours as needed.    Marland Kitchen FLUoxetine (PROZAC) 40 MG capsule TAKE 1 CAPSULE (40 MG TOTAL) BY MOUTH DAILY. 30 capsule 0  . ibuprofen (ADVIL,MOTRIN) 200 MG tablet Take 200 mg by mouth every 6 (six) hours as needed for pain. Takes 4 tablets (800 mg) every 4-6 hrs prn    . lamoTRIgine (LAMICTAL) 100 MG tablet TAKE 1 TABLET (100 MG TOTAL) BY MOUTH DAILY. 30 tablet 0  . magnesium 30 MG tablet Take 250 mg by mouth 1 day or 1 dose.    . metroNIDAZOLE (METROGEL) 0.75 % vaginal gel   2  . metroNIDAZOLE (METROGEL) 0.75 % vaginal gel PLACE 1 APPLICATORFUL VAGINALLY AT BEDTIME. FOR 5 DAYS 70 g 2  . polyethylene glycol (MIRALAX / GLYCOLAX) packet Take 17 g by mouth daily.    . pravastatin (PRAVACHOL) 40 MG tablet Take 1 tablet (40 mg total) by mouth daily. 90 tablet 1  . ranitidine (ZANTAC) 300 MG tablet Take 1 tablet (300 mg total) by mouth at bedtime. 30 tablet 1  . SYNTHROID 100 MCG tablet TAKE 1 TO 1&1/2 TABLETS DAILY AS DIRECTED FOR THYROID 145 tablet 0   No current facility-administered medications on file prior to visit.   Past Medical History  Diagnosis Date  . Depression   . Hypothyroidism   . HSV-1  infection   . Fibromyalgia   . Preeclampsia   . Hypertension   . Anxiety   . Hemorrhoids   . Constipation   . GERD (gastroesophageal reflux disease)   . Other abnormal glucose   . Anemia     ROS:  Gen.: No unexpected weight change, no night sweats Lungs: No cough or shortness of breath Cardiovascular: No palpitations or chest pain GYN: + discharge, itching, foul smell  PE: BP 126/78 mmHg  Pulse 72  Temp(Src) 97.9 F (36.6 C)  Resp 16  Ht 5\' 1"  (1.549 m)  Wt 175 lb 3.2 oz (79.47 kg)  BMI 33.12 kg/m2 General: No acute distress Lungs: Clear to auscultation Cardiovascular: Regular rate rhythm, no edema Abdomen: Mild to moderate discomfort of her suprapubic region, no flank tenderness to palpation GYN. VAGINA: vaginal erythema, vaginal discharge - clear and odorless, WET MOUNT done   Lab Results  Component Value Date   WBC 8.2 06/02/2015   HGB 13.7 06/02/2015   HCT 41.5 06/02/2015   PLT 192 06/02/2015   GLUCOSE 78 06/02/2015   CHOL 185 06/02/2015   TRIG 171* 06/02/2015   HDL 52 06/02/2015   LDLDIRECT 114.9 04/30/2012   LDLCALC 99 06/02/2015   ALT 15 06/02/2015   AST 16 06/02/2015   NA 139 06/02/2015   K  4.3 06/02/2015   CL 102 06/02/2015   CREATININE 0.66 06/02/2015   BUN 12 06/02/2015   CO2 27 06/02/2015   TSH 3.516 06/02/2015   INR 0.9 05/27/2009   HGBA1C 5.6 06/02/2015   MICROALBUR 0.81 02/20/2014    Assessment/Plan: UTI, classic symptoms with history of same Empiric antibiotic x7 days Urine culture for identification and sensitivities Hydration recommended education provided  Vaginal discharge will get wet prep  Hypothyroidism Check levels  Depression- Follow up with psych, no SI/HI

## 2015-07-02 LAB — URINALYSIS, ROUTINE W REFLEX MICROSCOPIC
Bilirubin Urine: NEGATIVE
Glucose, UA: NEGATIVE
Hgb urine dipstick: NEGATIVE
Ketones, ur: NEGATIVE
Leukocytes, UA: NEGATIVE
Nitrite: NEGATIVE
PROTEIN: NEGATIVE
Specific Gravity, Urine: 1.021 (ref 1.001–1.035)
pH: 7.5 (ref 5.0–8.0)

## 2015-07-02 LAB — WET PREP BY MOLECULAR PROBE
Candida species: NEGATIVE
Gardnerella vaginalis: NEGATIVE
Trichomonas vaginosis: NEGATIVE

## 2015-07-02 LAB — URINE CULTURE
Colony Count: NO GROWTH
Organism ID, Bacteria: NO GROWTH

## 2015-07-02 LAB — TSH: TSH: 3.88 u[IU]/mL (ref 0.350–4.500)

## 2015-07-06 ENCOUNTER — Ambulatory Visit (HOSPITAL_COMMUNITY): Payer: Self-pay | Admitting: Psychiatry

## 2015-07-08 ENCOUNTER — Encounter: Payer: Self-pay | Admitting: Women's Health

## 2015-07-08 ENCOUNTER — Ambulatory Visit (INDEPENDENT_AMBULATORY_CARE_PROVIDER_SITE_OTHER): Payer: BC Managed Care – PPO | Admitting: Women's Health

## 2015-07-08 VITALS — BP 122/70 | HR 72 | Wt 175.0 lb

## 2015-07-08 DIAGNOSIS — A499 Bacterial infection, unspecified: Secondary | ICD-10-CM | POA: Diagnosis not present

## 2015-07-08 DIAGNOSIS — N760 Acute vaginitis: Secondary | ICD-10-CM | POA: Insufficient documentation

## 2015-07-08 DIAGNOSIS — R300 Dysuria: Secondary | ICD-10-CM

## 2015-07-08 DIAGNOSIS — B9689 Other specified bacterial agents as the cause of diseases classified elsewhere: Secondary | ICD-10-CM | POA: Insufficient documentation

## 2015-07-08 DIAGNOSIS — Z3202 Encounter for pregnancy test, result negative: Secondary | ICD-10-CM | POA: Diagnosis not present

## 2015-07-08 DIAGNOSIS — N898 Other specified noninflammatory disorders of vagina: Secondary | ICD-10-CM

## 2015-07-08 DIAGNOSIS — N912 Amenorrhea, unspecified: Secondary | ICD-10-CM

## 2015-07-08 LAB — POCT URINALYSIS DIPSTICK
Glucose, UA: NEGATIVE
Ketones, UA: NEGATIVE
LEUKOCYTES UA: NEGATIVE
Nitrite, UA: NEGATIVE
PROTEIN UA: NEGATIVE

## 2015-07-08 LAB — POCT WET PREP (WET MOUNT): Clue Cells Wet Prep Whiff POC: POSITIVE

## 2015-07-08 LAB — POCT URINE PREGNANCY: Preg Test, Ur: NEGATIVE

## 2015-07-08 MED ORDER — METRONIDAZOLE 500 MG PO TABS: 500.0000 mg | ORAL_TABLET | Freq: Two times a day (BID) | ORAL | Status: DC

## 2015-07-08 NOTE — Progress Notes (Signed)
Patient ID: Lauren [name], female   DOB: [birth year], 34 y.o.   MRN: [id number]   [facility] Visit  Patient name: Lauren [name] MRN [id number]  Date of birth: 1981-02-19  CC & HPI:  Lauren [name] is a 34 y.o. Caucasian female presenting today for report of vaginal itching/irritation w/ greenish/yellow d/c w/ odor x 1 month- went to her pcp last week, was told all was normal. Period late, has just been spotting last few days- has regular periods- uses period app and it said she was 3 days late. Patient's last menstrual period was 06/10/2015.   Constant pain Rt lateral abd- worse at times. Diarrhea last few days, but starting to form up more today. Last pap 11/2014 normal States she gets BV often  Pertinent History Reviewed:  Medical & Surgical Hx:   Past Medical History  Diagnosis Date  . Depression   . Hypothyroidism   . HSV-1 infection   . Fibromyalgia   . Preeclampsia   . Hypertension   . Anxiety   . Hemorrhoids   . Constipation   . GERD (gastroesophageal reflux disease)   . Other abnormal glucose   . Anemia    Past Surgical History  Procedure Laterality Date  . Cholecystectomy  2006  . Tonsillectomy    . Wisdom tooth extraction     Medications: Reviewed & Updated - see associated section Social History: Reviewed -  reports that she has never smoked. She has never used smokeless tobacco.  Objective Findings:  Vitals: BP 122/70 mmHg  Pulse 72  Wt 175 lb (79.379 kg)  LMP 06/10/2015  Physical Examination: General appearance - alert, well appearing, and in no distress Pelvic - normal external genitalia, vulva, vagina, cervix, uterus and adnexa Is on period now Mod amt menstrual blood, malodorous No CMT, uterus normal size/contour- no tenderness, no adnexal masses/tenderness  Results for orders placed or performed in visit on 07/08/15 (from the past 24 hour(s))  POCT urinalysis dipstick   Collection Time: 07/08/15 11:37 AM  Result Value Ref Range   Color, UA     Clarity, UA     Glucose, UA neg    Bilirubin, UA     Ketones, UA neg    Spec Grav, UA     Blood, UA large    pH, UA     Protein, UA neg    Urobilinogen, UA     Nitrite, UA neg    Leukocytes, UA Negative Negative  POCT urine pregnancy   Collection Time: 07/08/15 11:37 AM  Result Value Ref Range   Preg Test, Ur Negative Negative  POCT Wet Prep [facility])   Collection Time: 07/08/15 12:14 PM  Result Value Ref Range   Source Wet Prep POC vaginal    WBC, Wet Prep HPF POC none    Bacteria Wet Prep HPF POC None None, Few   BACTERIA WET PREP MORPHOLOGY POC     Clue Cells Wet Prep HPF POC Moderate (A) None   Clue Cells Wet Prep Whiff POC Positive Whiff    Yeast Wet Prep HPF POC None    KOH Wet Prep POC     Trichomonas Wet Prep HPF POC none       Assessment & Plan:  A:   BV  Unspecified Rt lateral abd pain, not gyn in etiology P:  Rx metronidazole  bid x 7d for BV, no sex/etoh while taking  Can try rephresh to see if it can help w/  frequent bv  F/U after 1/20 for physical  If Rt lateral abd pain continues to f/u w/ PCP   Grace Bushy Cheron Every CNM, WHNP-BC 07/08/2015 12:14 PM

## 2015-07-08 NOTE — Patient Instructions (Signed)
Rephresh or Luvena Plan to return after January 20th for a physical

## 2015-07-22 ENCOUNTER — Ambulatory Visit (INDEPENDENT_AMBULATORY_CARE_PROVIDER_SITE_OTHER): Payer: BC Managed Care – PPO | Admitting: Psychiatry

## 2015-07-22 ENCOUNTER — Encounter (HOSPITAL_COMMUNITY): Payer: Self-pay | Admitting: Psychiatry

## 2015-07-22 DIAGNOSIS — F339 Major depressive disorder, recurrent, unspecified: Secondary | ICD-10-CM | POA: Diagnosis not present

## 2015-07-22 DIAGNOSIS — F331 Major depressive disorder, recurrent, moderate: Secondary | ICD-10-CM

## 2015-07-22 MED ORDER — FLUOXETINE HCL 40 MG PO CAPS: ORAL_CAPSULE | ORAL | Status: DC

## 2015-07-22 MED ORDER — LAMOTRIGINE 150 MG PO TABS: 150.0000 mg | ORAL_TABLET | Freq: Every day | ORAL | Status: DC

## 2015-07-22 NOTE — Progress Notes (Signed)
[place] [phone] Progress Note  Lauren [name] [id number] 34 y.o.  07/22/2015 3:37 PM  Chief Complaint:  I still feel very fatigued and tired.      History of Present Illness:  Lauren [name] came for her follow-up appointment.  She is complaining of fatigue and tired.  She has noticed getting easily frustrated and reported decreased energy.  Though she like her increase Lamictal which is helping her depression but she still have moments of extreme tiredness and lack of motivation.  She is not sure if her thyroid needs to be adjusted.  She recently seen her primary care physician and she had blood work.  Her hemoglobin A1c is normal.  Her basic chemistry was also normal.  She is taking Lamictal 100 mg and in the beginning she felt much improvement in her energy level but now she is feeling that it stopped working.  She sleeping good but didn't the day she gets frustrated .  She is taking Xanax 1 mg half tablet at bedtime because helping her sleep.  She denies any paranoia or any hallucination.  She has no rash or itching.  She has no headaches.  Her appetite is okay.  Her vitals are stable.  She denies drinking or using any illegal substances.  She lives with her husband and 2 daughters.  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  She has no tremors or shakes.  She is seeing Lovena Le for counseling.    Suicidal Ideation: No Plan Formed: No Patient has means to carry out plan: No  Homicidal Ideation: No Plan Formed: No Patient has means to carry out plan: No  Past Psychiatric History/Hospitalization(s) She remember taking antidepressant since 2002 when she was in college from primary care physician.  At that time she remembered feeling isolated, withdrawn, having crying spells and decreased energy.  She had tried Effexor and Prozac with good response by her primary care physician.  She also remembered taking Depakote, Zoloft, Celexa, Cymbalta,  Wellbutrin and recently Brintellix. Patient denies any history of abuse in the past.   Anxiety: Yes Bipolar Disorder: No Depression: Yes Mania: No Psychosis: No Schizophrenia: No Personality Disorder: No Hospitalization for psychiatric illness: No History of Electroconvulsive Shock Therapy: No Prior Suicide Attempts: No  Medical History; Patient has history of HSV, hypertension, GERD, hemorrhoids and surgical history of tonsillectomy and cholecystectomy.  She has obesity, fibromyalgia, hypothyroidism and vitamin D deficiency.  Her primary care physician is Dr. Idell Pickles. Patient denies any history of seizures.  Education and Work History; Patient has college education.  She is working as a Agricultural engineer for more than 8 years.  Review of Systems  Constitutional: Positive for malaise/fatigue.       Tired  Musculoskeletal: Negative.   Skin: Negative for itching and rash.  Neurological: Negative for dizziness and tremors.  Psychiatric/Behavioral: Positive for depression. The patient is nervous/anxious. The patient does not have insomnia.     Psychiatric: Agitation: No Hallucination: No Depressed Mood: Yes Insomnia: No Hypersomnia: No Altered Concentration: No Feels Worthless: No Grandiose Ideas: No Belief In Special Powers: No New/Increased Substance Abuse: No Compulsions: No  Neurologic: Headache: No Seizure: No Paresthesias: No   Musculoskeletal: Strength & Muscle Tone: within normal limits Gait & Station: normal Patient leans: N/A  Outpatient Encounter Prescriptions as of 07/22/2015  Medication Sig Note  . ALPRAZolam (XANAX) 1 MG tablet TAKE 1/2-1 TABLET BY MOUTH TWICE A DAY AS NEEDED FOR  ANXIETY. APPT. DR Justine Null   . Cholecalciferol (VITAMIN D) 2000 UNITS CAPS Take 8,000 Units by mouth daily.    . diphenhydrAMINE (BENADRYL) 25 MG tablet Take 25 mg by mouth every 6 (six) hours as needed.   Marland Kitchen FLUoxetine (PROZAC) 40 MG capsule TAKE 1 CAPSULE (40 MG TOTAL) BY  MOUTH DAILY.   Marland Kitchen ibuprofen (ADVIL,MOTRIN) 200 MG tablet Take 200 mg by mouth every 6 (six) hours as needed for pain. Takes 4 tablets (800 mg) every 4-6 hrs prn   . lamoTRIgine (LAMICTAL) 150 MG tablet Take 1 tablet (150 mg total) by mouth daily.   . magnesium 30 MG tablet Take 250 mg by mouth 1 day or 1 dose.   . metroNIDAZOLE (FLAGYL) 500 MG tablet Take 1 tablet (500 mg total) by mouth 2 (two) times daily. X 7 days. No sex or alcohol while taking   . metroNIDAZOLE (METROGEL) 0.75 % vaginal gel  12/01/2014: Received from: External Pharmacy  . metroNIDAZOLE (METROGEL) 0.75 % vaginal gel PLACE 1 APPLICATORFUL VAGINALLY AT BEDTIME. FOR 5 DAYS   . polyethylene glycol (MIRALAX / GLYCOLAX) packet Take 17 g by mouth daily.   . pravastatin (PRAVACHOL) 40 MG tablet Take 1 tablet (40 mg total) by mouth daily.   . ranitidine (ZANTAC) 300 MG tablet Take 1 tablet (300 mg total) by mouth at bedtime.   Marland Kitchen SYNTHROID 100 MCG tablet TAKE 1 TO 1&1/2 TABLETS DAILY AS DIRECTED FOR THYROID   . [DISCONTINUED] FLUoxetine (PROZAC) 40 MG capsule TAKE 1 CAPSULE (40 MG TOTAL) BY MOUTH DAILY.   . [DISCONTINUED] lamoTRIgine (LAMICTAL) 100 MG tablet TAKE 1 TABLET (100 MG TOTAL) BY MOUTH DAILY.    No facility-administered encounter medications on file as of 07/22/2015.     Recent Results (from the past 2160 hour(s))  CBC with Differential/Platelet     Status: None   Collection Time: 06/02/15 11:59 AM  Result Value Ref Range   WBC 8.2 4.0 - 10.5 K/uL   RBC 4.75 3.87 - 5.11 MIL/uL   Hemoglobin 13.7 12.0 - 15.0 g/dL   HCT 41.5 36.0 - 46.0 %   MCV 87.4 78.0 - 100.0 fL   MCH 28.8 26.0 - 34.0 pg   MCHC 33.0 30.0 - 36.0 g/dL   RDW 13.3 11.5 - 15.5 %   Platelets 192 150 - 400 K/uL   MPV 10.6 8.6 - 12.4 fL   Neutrophils Relative % 63 43 - 77 %   Neutro Abs 5.2 1.7 - 7.7 K/uL   Lymphocytes Relative 28 12 - 46 %   Lymphs Abs 2.3 0.7 - 4.0 K/uL   Monocytes Relative 8 3 - 12 %   Monocytes Absolute 0.7 0.1 - 1.0 K/uL    Eosinophils Relative 1 0 - 5 %   Eosinophils Absolute 0.1 0.0 - 0.7 K/uL   Basophils Relative 0 0 - 1 %   Basophils Absolute 0.0 0.0 - 0.1 K/uL   Smear Review Criteria for review not met   BASIC METABOLIC PANEL WITH GFR     Status: None   Collection Time: 06/02/15 11:59 AM  Result Value Ref Range   Sodium 139 135 - 145 mEq/L   Potassium 4.3 3.5 - 5.3 mEq/L   Chloride 102 96 - 112 mEq/L   CO2 27 19 - 32 mEq/L   Glucose, Bld 78 70 - 99 mg/dL   BUN 12 6 - 23 mg/dL   Creat 0.66 0.50 - 1.10 mg/dL   Calcium 9.6 8.4 - 10.5  mg/dL   GFR, Est African American >89 mL/min   GFR, Est Non African American >89 mL/min    Comment:   The estimated GFR is a calculation valid for adults (>=51 years old) that uses the CKD-EPI algorithm to adjust for age and sex. It is   not to be used for children, pregnant women, hospitalized patients,    patients on dialysis, or with rapidly changing kidney function. According to the NKDEP, eGFR >89 is normal, 60-89 shows mild impairment, 30-59 shows moderate impairment, 15-29 shows severe impairment and <15 is ESRD.     Hepatic function panel     Status: None   Collection Time: 06/02/15 11:59 AM  Result Value Ref Range   Total Bilirubin 0.5 0.2 - 1.2 mg/dL   Bilirubin, Direct 0.1 0.0 - 0.3 mg/dL   Indirect Bilirubin 0.4 0.2 - 1.2 mg/dL   Alkaline Phosphatase 51 39 - 117 U/L   AST 16 0 - 37 U/L   ALT 15 0 - 35 U/L   Total Protein 7.1 6.0 - 8.3 g/dL   Albumin 4.3 3.5 - 5.2 g/dL  TSH     Status: None   Collection Time: 06/02/15 11:59 AM  Result Value Ref Range   TSH 3.516 0.350 - 4.500 uIU/mL  Lipid panel     Status: Abnormal   Collection Time: 06/02/15 11:59 AM  Result Value Ref Range   Cholesterol 185 0 - 200 mg/dL    Comment: ATP III Classification:       < 200        mg/dL        Desirable      200 - 239     mg/dL        Borderline High      >= 240        mg/dL        High      Triglycerides 171 (H) <150 mg/dL   HDL 52 >=46 mg/dL   Total  CHOL/HDL Ratio 3.6 Ratio   VLDL 34 0 - 40 mg/dL   LDL Cholesterol 99 0 - 99 mg/dL    Comment:   Total Cholesterol/HDL Ratio:CHD Risk                        Coronary Heart Disease Risk Table                                        Men       Women          1/2 Average Risk              3.4        3.3              Average Risk              5.0        4.4           2X Average Risk              9.6        7.1           3X Average Risk             23.4       11.0 Use the calculated Patient Ratio above and the CHD Risk table  to determine the patient's CHD Risk. ATP III Classification (LDL):       < 100        mg/dL         Optimal      100 - 129     mg/dL         Near or Above Optimal      130 - 159     mg/dL         Borderline High      160 - 189     mg/dL         High       > 190        mg/dL         Very High     Hemoglobin A1c     Status: None   Collection Time: 06/02/15 11:59 AM  Result Value Ref Range   Hgb A1c MFr Bld 5.6 <5.7 %    Comment:                                                                        According to the ADA Clinical Practice Recommendations for 2011, when HbA1c is used as a screening test:     >=6.5%   Diagnostic of Diabetes Mellitus            (if abnormal result is confirmed)   5.7-6.4%   Increased risk of developing Diabetes Mellitus   References:Diagnosis and Classification of Diabetes Mellitus,Diabetes NKNL,9767,34(LPFXT 1):S62-S69 and Standards of Medical Care in         Diabetes - 2011,Diabetes Care,2011,34 (Suppl 1):S11-S61.      Mean Plasma Glucose 114 <117 mg/dL  Insulin, fasting     Status: None   Collection Time: 06/02/15 11:59 AM  Result Value Ref Range   Insulin fasting, serum 6.8 2.0 - 19.6 uIU/mL    Comment:   This insulin assay shows strong cross-reactivity for some insulin analogs (lispro, aspart, and glargine) and much lower cross-reactivity with others (detemir, glulisine).   Stimulated Insulin reference intervals were  established using the Siemens Immulite assay. These values are provided for general guidance only.   Magnesium     Status: None   Collection Time: 06/02/15 11:59 AM  Result Value Ref Range   Magnesium 1.9 1.5 - 2.5 mg/dL  Vit D  25 hydroxy (rtn osteoporosis monitoring)     Status: None   Collection Time: 06/02/15 11:59 AM  Result Value Ref Range   Vit D, 25-Hydroxy 59 30 - 100 ng/mL    Comment: Vitamin D Status           25-OH Vitamin D        Deficiency                <20 ng/mL        Insufficiency         20 - 29 ng/mL        Optimal             > or = 30 ng/mL   For 25-OH Vitamin D testing on patients on D2-supplementation and patients for whom quantitation of D2 and D3 fractions is required, the QuestAssureD 25-OH VIT D, (  D2,D3), LC/MS/MS is recommended: order code [phone] (patients > 2 yrs).   Iron and TIBC     Status: None   Collection Time: 06/02/15 11:59 AM  Result Value Ref Range   Iron 71 42 - 145 ug/dL   UIBC 290 125 - 400 ug/dL   TIBC 361 250 - 470 ug/dL   %SAT 20 20 - 55 %  Ferritin     Status: None   Collection Time: 06/02/15 11:59 AM  Result Value Ref Range   Ferritin 27 10 - 291 ng/mL  Vitamin B12     Status: Abnormal   Collection Time: 06/02/15 11:59 AM  Result Value Ref Range   Vitamin B-12 1057 (H) 211 - 911 pg/mL  Lamotrigine level     Status: Abnormal   Collection Time: 06/02/15 11:59 AM  Result Value Ref Range   Lamotrigine Lvl 2.4 (L) 4.0 - 18.0 mcg/mL  Urine culture     Status: None   Collection Time: 07/01/15 11:29 AM  Result Value Ref Range   Colony Count NO GROWTH    Organism ID, Bacteria NO GROWTH   WET PREP BY MOLECULAR PROBE     Status: None   Collection Time: 07/01/15 11:29 AM  Result Value Ref Range   Candida species NEG Negative   Trichomonas vaginosis NEG Negative   Gardnerella vaginalis NEG Negative  Urinalysis, Routine w reflex microscopic (not at [facility])     Status: None   Collection Time: 07/01/15 11:36 AM  Result Value Ref Range    Color, Urine YELLOW YELLOW    Comment: ** Please note change in unit of measure and reference range(s). **      APPearance CLEAR CLEAR   Specific Gravity, Urine 1.021 1.001 - 1.035   pH 7.5 5.0 - 8.0   Glucose, UA NEGATIVE NEGATIVE   Bilirubin Urine NEGATIVE NEGATIVE   Ketones, ur NEGATIVE NEGATIVE   Hgb urine dipstick NEGATIVE NEGATIVE   Protein, ur NEGATIVE NEGATIVE   Nitrite NEGATIVE NEGATIVE   Leukocytes, UA NEGATIVE NEGATIVE  TSH     Status: None   Collection Time: 07/01/15 11:36 AM  Result Value Ref Range   TSH 3.880 0.350 - 4.500 uIU/mL  POCT urinalysis dipstick     Status: None   Collection Time: 07/08/15 11:37 AM  Result Value Ref Range   Color, UA     Clarity, UA     Glucose, UA neg    Bilirubin, UA     Ketones, UA neg    Spec Grav, UA     Blood, UA large    pH, UA     Protein, UA neg    Urobilinogen, UA     Nitrite, UA neg    Leukocytes, UA Negative Negative  POCT urine pregnancy     Status: None   Collection Time: 07/08/15 11:37 AM  Result Value Ref Range   Preg Test, Ur Negative Negative  POCT Wet Prep [facility])     Status: Abnormal   Collection Time: 07/08/15 12:14 PM  Result Value Ref Range   Source Wet Prep POC vaginal    WBC, Wet Prep HPF POC none    Bacteria Wet Prep HPF POC None None, Few   BACTERIA WET PREP MORPHOLOGY POC     Clue Cells Wet Prep HPF POC Moderate (A) None   Clue Cells Wet Prep Whiff POC Positive Whiff    Yeast Wet Prep HPF POC None    KOH Wet Prep POC  Trichomonas Wet Prep HPF POC none       Constitutional:  LMP 06/10/2015   Mental Status Examination;  Patient is well groomed well dressed female who appears to be in her stated age.  She is anxious but cooperative  She maintained fair eye contact.  Her speech is soft, clear, coherent with normal tone and volume.  She described her mood anxious and depressed.  Her affect is constricted.   Her thought process slow but logical and goal-directed.  There were no flight of  ideas or any loose association.  Her psychomotor activity is slow.  Her fund of knowledge is good.  She denies any auditory or visual hallucination.  She denies any active or passive suicidal thoughts or homicidal thought.  There were no delusions, paranoia or any obsessive thoughts.  Her cognition is good.  She's alert and oriented 3.  There were no tremors or shakes.  Her insight judgment and impulse control is okay.   Established Problem, Stable/Improving (1), Review of Psycho-Social Stressors (1), Review or order clinical lab tests (1), Established Problem, Worsening (2), Review of Last Therapy Session (1), Review of Medication Regimen & Side Effects (2) and Review of New Medication or Change in Dosage (2)  Assessment: Axis I: Major depressive disorder, recurrent  Axis II: Deferred  Axis III:  Past Medical History  Diagnosis Date  . Depression   . Hypothyroidism   . HSV-1 infection   . Fibromyalgia   . Preeclampsia   . Hypertension   . Anxiety   . Hemorrhoids   . Constipation   . GERD (gastroesophageal reflux disease)   . Other abnormal glucose   . Anemia      Plan:  I review her blood work results including CBC, hemoglobin A1c and basic chemistry.  Recommended to try Lamictal 150 mg to help with residual mood lability.  She is concerned about her thyroid and I suggested to see endocrinologist to get opinion about her thyroid.  At this time we will continue Prozac 40 mg daily.  She has no rash itching or any headaches.  Recommended to continue therapy with Lovena Le for counseling.  Discuss safety plan that anytime having active suicidal thoughts or homicidal thought and she need to call 911 or go to the local emergency room.  Patient is getting Xanax 1 mg half tablet from primary care physician.  Follow-up in 2 months.   ARFEEN,SYED T., MD 07/22/2015

## 2015-07-29 ENCOUNTER — Other Ambulatory Visit: Payer: Self-pay | Admitting: *Deleted

## 2015-07-29 MED ORDER — LEVOTHYROXINE SODIUM 100 MCG PO TABS: ORAL_TABLET | ORAL | Status: DC

## 2015-07-30 ENCOUNTER — Ambulatory Visit (INDEPENDENT_AMBULATORY_CARE_PROVIDER_SITE_OTHER): Payer: BC Managed Care – PPO | Admitting: Physician Assistant

## 2015-07-30 ENCOUNTER — Other Ambulatory Visit: Payer: Self-pay

## 2015-07-30 VITALS — BP 102/72 | HR 70 | Temp 97.9°F | Resp 16 | Wt 179.0 lb

## 2015-07-30 DIAGNOSIS — J069 Acute upper respiratory infection, unspecified: Secondary | ICD-10-CM | POA: Diagnosis not present

## 2015-07-30 DIAGNOSIS — E039 Hypothyroidism, unspecified: Secondary | ICD-10-CM

## 2015-07-30 LAB — TSH: TSH: 1.222 u[IU]/mL (ref 0.350–4.500)

## 2015-07-30 MED ORDER — AZITHROMYCIN 250 MG PO TABS: ORAL_TABLET | ORAL | Status: AC

## 2015-07-30 MED ORDER — PREDNISONE 20 MG PO TABS: ORAL_TABLET | ORAL | Status: DC

## 2015-07-30 NOTE — Patient Instructions (Signed)
I will give you a prescription for an antibiotic, but please only take it if you are not feeling better in 7-10 days.  Bronchitis is mostly caused by viruses and the antibiotic will do nothing.  PLEASE TRY TO DO OVER THE COUNTER TREATMENT AND/OR PREDNISONE FOR 5-7 DAYS AND IF YOU ARE NOT GETTING BETTER OR GETTING WORSE THEN YOU CAN START ON AN ANTIBIOTIC GIVEN.  Can take the prednisone AT NIGHT WITH DINNER, it take 8-12 hours to start working so it will NOT affect your sleeping if you take it at night with your food!! Take two pills the first night and 1 or two pill the second night and then 1 pill the other nights.    Rest and stay hydrated.  Make sure you drink plenty of fluids to make sure urine is clear when you urinate.  Water will help thin out mucous. - Take Mucinex DM- Maximum Strength over the counter to thin out and cough up the thick mucous.  Please follow directions on box. -Take Albuterol if prescribed.  Risk of antibiotic use: About 1 in 4 people who take antibiotics have side effects including stomach problems, dizziness, or rashes. Those problems clear up soon after stopping the drugs, but in rare cases antibiotics can cause severe allergic reaction. Over use of antibiotics also encourages the growth of bacteria that can't be controlled easily with drugs. That makes you more vunerable to antibiotic-resistant infections and undermines the benefits of antibiotics for others.   Waste of Money: Antibiotics often aren't very expensive, but any money spent on unnecessary drugs is money down the drain.   When are antibiotics needed? Only when symptoms last longer than a week.  Start to improve but then worsen again  Please call the office or message through My Chart if you have any questions.   Acute Bronchitis Bronchitis is when the airways that extend from the windpipe into the lungs get red, puffy, and painful (inflamed). Bronchitis often causes thick spit (mucus) to develop. This  leads to a cough. A cough is the most common symptom of bronchitis. In acute bronchitis, the condition usually begins suddenly and goes away over time (usually in 2 weeks). Smoking, allergies, and asthma can make bronchitis worse. Repeated episodes of bronchitis may cause more lung problems.  Most common cause of Bronchitis is viruses (rhinovirus, coronavirus, RSV).  Therefore, not requiring an antibiotic; as antibiotics only treat bacterial infections.  HOME CARE  Rest.  Drink enough fluids to keep your pee (urine) clear or pale yellow (unless you need to limit fluids as told by your doctor).  Only take over-the-counter or prescription medicines as told by your doctor.  Avoid smoking and secondhand smoke. These can make bronchitis worse. If you are a smoker, think about using nicotine gum or skin patches. Quitting smoking will help your lungs heal faster.  Reduce the chance of getting bronchitis again by:  Washing your hands often.  Avoiding people with cold symptoms.  Trying not to touch your hands to your mouth, nose, or eyes.  Follow up with your doctor as told. GET HELP IF: Your symptoms do not improve after 1 week of treatment. Symptoms include:  Cough.  Fever.  Coughing up thick spit.  Body aches.  Chest congestion.  Chills.  Shortness of breath.  Sore throat. GET HELP RIGHT AWAY IF:   You have an increased fever.  You have chills.  You have severe shortness of breath.  You have bloody thick spit (sputum).    You throw up (vomit) often.  You lose too much body fluid (dehydration).  You have a severe headache.  You faint. MAKE SURE YOU:   Understand these instructions.  Will watch your condition.  Will get help right away if you are not doing well or get worse. Document Released: 04/25/2008 Document Revised: 07/10/2013 Document Reviewed: 04/30/2013 Exit[employer] Patient Information 2015 [employer]. This information is not intended to replace  advice given to you by your health care provider. Make sure you discuss any questions you have with your health care provider.   

## 2015-07-30 NOTE — Progress Notes (Signed)
Subjective:    Patient ID: Lauren [name], Lauren [name] Nov 05, 1981, 34 y.o.   MRN: [id number]  HPI 34 y.o. WF presents with cold symptoms. She has had cold symptoms x 1 week, sore throat, runny nose, non productive cough, subjective fever/chills but has not taken temperature. 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  She is also here to check her thyroid level. She is on 1 Mon, Wednesday, Friday and 1.5 pill 4 days a week.  Lab Results  Component Value Date   TSH 3.880 07/01/2015    Blood pressure 102/72, pulse 70, temperature 97.9 F (36.6 C), resp. rate 16, weight 179 lb (81.194 kg), last menstrual period 06/10/2015.  Current Outpatient Prescriptions on File Prior to Visit  Medication Sig Dispense Refill  . ALPRAZolam (XANAX) 1 MG tablet TAKE 1/2-1 TABLET BY MOUTH TWICE A DAY AS NEEDED FOR ANXIETY. APPT. DR AFEEN 60 tablet 0  . Cholecalciferol (VITAMIN D) 2000 UNITS CAPS Take 8,000 Units by mouth daily.     . diphenhydrAMINE (BENADRYL) 25 MG tablet Take 25 mg by mouth every 6 (six) hours as needed.    Marland Kitchen FLUoxetine (PROZAC) 40 MG capsule TAKE 1 CAPSULE (40 MG TOTAL) BY MOUTH DAILY. 30 capsule 1  . ibuprofen (ADVIL,MOTRIN) 200 MG tablet Take 200 mg by mouth every 6 (six) hours as needed for pain. Takes 4 tablets (800 mg) every 4-6 hrs prn    . lamoTRIgine (LAMICTAL) 150 MG tablet Take 1 tablet (150 mg total) by mouth daily. 30 tablet 1  . levothyroxine (SYNTHROID) 100 MCG tablet TAKE 1 TO 1&1/2 TABLETS DAILY AS DIRECTED FOR THYROID 145 tablet 3  . magnesium 30 MG tablet Take 250 mg by mouth 1 day or 1 dose.    . metroNIDAZOLE (FLAGYL) 500 MG tablet Take 1 tablet (500 mg total) by mouth 2 (two) times daily. X 7 days. No sex or alcohol while taking 14 tablet 0  . metroNIDAZOLE (METROGEL) 0.75 % vaginal gel   2  . metroNIDAZOLE (METROGEL) 0.75 % vaginal gel PLACE 1 APPLICATORFUL VAGINALLY AT BEDTIME. FOR 5 DAYS 70 g 2  . polyethylene glycol (MIRALAX / GLYCOLAX) packet Take  17 g by mouth daily.    . pravastatin (PRAVACHOL) 40 MG tablet Take 1 tablet (40 mg total) by mouth daily. 90 tablet 1  . ranitidine (ZANTAC) 300 MG tablet Take 1 tablet (300 mg total) by mouth at bedtime. 30 tablet 1   No current facility-administered medications on file prior to visit.   Past Medical History  Diagnosis Date  . Depression   . Hypothyroidism   . HSV-1 infection   . Fibromyalgia   . Preeclampsia   . Hypertension   . Anxiety   . Hemorrhoids   . Constipation   . GERD (gastroesophageal reflux disease)   . Other abnormal glucose   . Anemia     Review of Systems  Constitutional: Positive for fatigue. Negative for fever and chills.  HENT: Positive for congestion, rhinorrhea, sinus pressure and sore throat. Negative for dental problem, ear discharge, ear pain, nosebleeds, trouble swallowing and voice change.   Respiratory: Positive for cough. Negative for chest tightness, shortness of breath and wheezing.   Cardiovascular: Negative.   Gastrointestinal: Negative.   Genitourinary: Negative.   Musculoskeletal: Negative.   Neurological: Negative.        Objective:   Physical Exam  Constitutional: She is oriented to person, place, and time. She appears well-developed  and well-nourished.  HENT:  Head: Normocephalic and atraumatic.  Right Ear: External ear normal.  Left Ear: External ear normal.  Nose: Nose normal.  Mouth/Throat: Oropharynx is clear and moist.  Eyes: Conjunctivae are normal. Pupils are equal, round, and reactive to light.  Neck: Normal range of motion. Neck supple.  Cardiovascular: Normal rate and regular rhythm.   Pulmonary/Chest: Effort normal. No respiratory distress. She has wheezes. She has no rales. She exhibits no tenderness.  Abdominal: Soft. Bowel sounds are normal.  Lymphadenopathy:    She has cervical adenopathy.  Neurological: She is alert and oriented to person, place, and time.  Skin: Skin is warm and dry.       Assessment &  Plan:  URI Will hold the zpak and take if she is not getting better, increase fluids, rest, cont allergy pill

## 2015-08-06 ENCOUNTER — Other Ambulatory Visit: Payer: Self-pay | Admitting: Physician Assistant

## 2015-08-06 MED ORDER — LEVOFLOXACIN 500 MG PO TABS: 500.0000 mg | ORAL_TABLET | Freq: Every day | ORAL | Status: DC

## 2015-08-16 ENCOUNTER — Other Ambulatory Visit: Payer: Self-pay | Admitting: Physician Assistant

## 2015-08-17 NOTE — Telephone Encounter (Signed)
Called Rx in 

## 2015-09-02 ENCOUNTER — Ambulatory Visit: Payer: Self-pay | Admitting: Physician Assistant

## 2015-09-09 ENCOUNTER — Ambulatory Visit (INDEPENDENT_AMBULATORY_CARE_PROVIDER_SITE_OTHER): Payer: BC Managed Care – PPO | Admitting: Physician Assistant

## 2015-09-09 ENCOUNTER — Telehealth (HOSPITAL_COMMUNITY): Payer: Self-pay

## 2015-09-09 ENCOUNTER — Encounter: Payer: Self-pay | Admitting: Physician Assistant

## 2015-09-09 VITALS — BP 110/74 | HR 85 | Temp 98.2°F | Resp 16 | Ht 61.0 in | Wt 178.0 lb

## 2015-09-09 DIAGNOSIS — L309 Dermatitis, unspecified: Secondary | ICD-10-CM | POA: Diagnosis not present

## 2015-09-09 DIAGNOSIS — E785 Hyperlipidemia, unspecified: Secondary | ICD-10-CM

## 2015-09-09 DIAGNOSIS — R7303 Prediabetes: Secondary | ICD-10-CM | POA: Diagnosis not present

## 2015-09-09 DIAGNOSIS — I10 Essential (primary) hypertension: Secondary | ICD-10-CM | POA: Diagnosis not present

## 2015-09-09 DIAGNOSIS — Z79899 Other long term (current) drug therapy: Secondary | ICD-10-CM | POA: Insufficient documentation

## 2015-09-09 DIAGNOSIS — E559 Vitamin D deficiency, unspecified: Secondary | ICD-10-CM

## 2015-09-09 DIAGNOSIS — Z23 Encounter for immunization: Secondary | ICD-10-CM

## 2015-09-09 DIAGNOSIS — E669 Obesity, unspecified: Secondary | ICD-10-CM | POA: Diagnosis not present

## 2015-09-09 DIAGNOSIS — E039 Hypothyroidism, unspecified: Secondary | ICD-10-CM | POA: Diagnosis not present

## 2015-09-09 MED ORDER — CLOBETASOL PROPIONATE 0.05 % EX SOLN: 1.0000 "application " | Freq: Two times a day (BID) | CUTANEOUS | Status: DC

## 2015-09-09 NOTE — Patient Instructions (Addendum)
Fatigue Fatigue is feeling tired all of the time, a lack of energy, or a lack of motivation. Occasional or mild fatigue is often a normal response to activity or life in general. However, long-lasting (chronic) or extreme fatigue may indicate an underlying medical condition. HOME CARE INSTRUCTIONS  Watch your fatigue for any changes. The following actions may help to lessen any discomfort you are feeling:  Talk to your health care provider about how much sleep you need each night. Try to get the required amount every night.  Take medicines only as directed by your health care provider.  Eat a healthy and nutritious diet. Ask your health care provider if you need help changing your diet.  Drink enough fluid to keep your urine clear or pale yellow.  Practice ways of relaxing, such as yoga, meditation, massage therapy, or acupuncture.  Exercise regularly.   Change situations that cause you stress. Try to keep your work and personal routine reasonable.  Do not abuse illegal drugs.  Limit alcohol intake to no more than 1 drink per day for nonpregnant women and 2 drinks per day for men. One drink equals 12 ounces of beer, 5 ounces of wine, or 1 ounces of hard liquor.  Take a multivitamin, if directed by your health care provider. SEEK MEDICAL CARE IF:   Your fatigue does not get better.  You have a fever.   You have unintentional weight loss or gain.  You have headaches.   You have difficulty:   Falling asleep.  Sleeping throughout the night.  You feel angry, guilty, anxious, or sad.   You are unable to have a bowel movement (constipation).   You skin is dry.   Your legs or another part of your body is swollen.  SEEK IMMEDIATE MEDICAL CARE IF:   You feel confused.   Your vision is blurry.  You feel faint or pass out.   You have a severe headache.   You have severe abdominal, pelvic, or back pain.   You have chest pain, shortness of breath, or an  irregular or fast heartbeat.   You are unable to urinate or you urinate less than normal.   You develop abnormal bleeding, such as bleeding from the rectum, vagina, nose, lungs, or nipples.  You vomit blood.   You have thoughts about harming yourself or committing suicide.   You are worried that you might harm someone else.    This information is not intended to replace advice given to you by your health care provider. Make sure you discuss any questions you have with your health care provider.   Document Released: 09/04/2007 Document Revised: 11/28/2014 Document Reviewed: 03/11/2014 Elsevier Interactive Patient Education 2016 Else[employer] Inc[employer]   Eczema Eczema, also called atopic dermatitis, is a skin disorder that causes inflammation of the skin. It causes a red rash and dry, scaly skin. The skin becomes very itchy. Eczema is generally worse during the cooler winter months and often improves with the warmth of summer. Eczema usually starts showing signs in infancy. Some children outgrow eczema, but it may last through adulthood.  CAUSES  The exact cause of eczema is not known, but it appears to run in families. People with eczema often have a family history of eczema, allergies, asthma, or hay fever. Eczema is not contagious. Flare-ups of the condition may be caused by:   Contact with something you are sensitive or allergic to.   Stress. SIGNS AND SYMPTOMS  Dry, scaly skin.   Red,  itchy rash.   Itchiness. This may occur before the skin rash and may be very intense.  DIAGNOSIS  The diagnosis of eczema is usually made based on symptoms and medical history. TREATMENT  Eczema cannot be cured, but symptoms usually can be controlled with treatment and other strategies. A treatment plan might include:  Controlling the itching and scratching.   Use over-the-counter antihistamines as directed for itching. This is especially useful at night when the itching tends to be worse.    Use over-the-counter steroid creams as directed for itching.   Avoid scratching. Scratching makes the rash and itching worse. It may also result in a skin infection (impetigo) due to a break in the skin caused by scratching.   Keeping the skin well moisturized with creams every day. This will seal in moisture and help prevent dryness. Lotions that contain alcohol and water should be avoided because they can dry the skin.   Limiting exposure to things that you are sensitive or allergic to (allergens).   Recognizing situations that cause stress.   Developing a plan to manage stress.  HOME CARE INSTRUCTIONS   Only take over-the-counter or prescription medicines as directed by your health care provider.   Do not use anything on the skin without checking with your health care provider.   Keep baths or showers short (5 minutes) in warm (not hot) water. Use mild cleansers for bathing. These should be unscented. You may add nonperfumed bath oil to the bath water. It is best to avoid soap and bubble bath.   Immediately after a bath or shower, when the skin is still damp, apply a moisturizing ointment to the entire body. This ointment should be a petroleum ointment. This will seal in moisture and help prevent dryness. The thicker the ointment, the better. These should be unscented.   Keep fingernails cut short. Children with eczema may need to wear soft gloves or mittens at night after applying an ointment.   Dress in clothes made of cotton or cotton blends. Dress lightly, because heat increases itching.   A child with eczema should stay away from anyone with fever blisters or cold sores. The virus that causes fever blisters (herpes simplex) can cause a serious skin infection in children with eczema. SEEK MEDICAL CARE IF:   Your itching interferes with sleep.   Your rash gets worse or is not better within 1 week after starting treatment.   You see pus or soft yellow scabs in  the rash area.   You have a fever.   You have a rash flare-up after contact with someone who has fever blisters.    This information is not intended to replace advice given to you by your health care provider. Make sure you discuss any questions you have with your health care provider.   Document Released: 11/04/2000 Document Revised: 08/28/2013 Document Reviewed: 06/10/2013 Elsevier Interactive Patient Education [employer]2016 Elsevier Inc.

## 2015-09-09 NOTE — Progress Notes (Signed)
Assessment and Plan:  1. Hypertension -Continue medication, monitor blood pressure at home. Continue DASH diet.  Reminder to go to the ER if any CP, SOB, nausea, dizziness, severe HA, changes vision/speech, left arm numbness and tingling and jaw pain.  2. Cholesterol -Continue diet and exercise. Check cholesterol.   3. Prediabetes  -Continue diet and exercise. Check A1C  4. Vitamin D Def - check level and continue medications.   5. Puritis/dermatitis ? Eczema versus from recent increase in lamictal- will also check BMP/LFTs to rule out metabolic cause, suggest talking with Dr. Lolly Mustache about lamictal but likely needs to decrease dose, will give steroid solution for scalp which appears to be more like eczema versus exoriations from patient  6. Hypothyroidism -check TSH level, continue medications the same, reminded to take on an empty stomach 30-2mins before food. Thyroid is at goal at this time, not helping with fatigue but patient would like a second opinion, will send to endocrine  7. Fatigue Check labs but suspect from medication versus depression   Continue diet and meds as discussed. Further disposition pending results of labs. Over 30 minutes of exam, counseling, chart review, and critical decision making was performed  HPI 34 y.o. female  presents for 3 month follow up on hypertension, cholesterol, prediabetes, and vitamin D deficiency.   Her blood pressure has been controlled at home, today their BP is BP: 110/74 mmHg  She does not workout. She denies chest pain, shortness of breath, dizziness.  She is on cholesterol medication and denies myalgias. Her cholesterol is at goal. The cholesterol last visit was:   Lab Results  Component Value Date   CHOL 185 06/02/2015   HDL 52 06/02/2015   LDLCALC 99 06/02/2015   LDLDIRECT 114.9 04/30/2012   TRIG 171* 06/02/2015   CHOLHDL 3.6 06/02/2015    She has been working on diet and exercise for prediabetes, and denies paresthesia of  the feet, polydipsia, polyuria and visual disturbances. Last A1C in the office was:  Lab Results  Component Value Date   HGBA1C 5.6 06/02/2015   Patient is on Vitamin D supplement.   Lab Results  Component Value Date   VD25OH 56 06/02/2015     She is on thyroid medication. Her medication was not changed last visit Lab Results  Component Value Date   TSH 1.222 07/30/2015  .  She has depression and is following with psych, Dr. Lolly Mustache, on prozac 40, lamictal 100 to  about 6 weeks ago, and xanax PRN. Follows with [facility] for counseling. She complains of continuing fatigue, feels worse than usual.  She has itching "all over" face, head, arms, legs.  BMI is Body mass index is 33.65 kg/(m^2)., she is working on diet and exercise. Wt Readings from Last 3 Encounters:  09/09/15 178 lb (80.74 kg)  07/30/15 179 lb (81.194 kg)  07/08/15 175 lb (79.379 kg)      Current Medications:  Current Outpatient Prescriptions on File Prior to Visit  Medication Sig Dispense Refill  . ALPRAZolam (XANAX) 1 MG tablet TAKE 1 TABLET BY MOUTH TWICE A DAY AS NEEDED 60 tablet 0  . Cholecalciferol (VITAMIN D) 2000 UNITS CAPS Take 8,000 Units by mouth daily.     . diphenhydrAMINE (BENADRYL) 25 MG tablet Take 25 mg by mouth every 6 (six) hours as needed.    Marland Kitchen FLUoxetine (PROZAC) 40 MG capsule TAKE 1 CAPSULE (40 MG TOTAL) BY MOUTH DAILY. 30 capsule 1  . ibuprofen (ADVIL,MOTRIN) 200 MG tablet Take 200  mg by mouth every 6 (six) hours as needed for pain. Takes 4 tablets (800 mg) every 4-6 hrs prn    . lamoTRIgine (LAMICTAL) 150 MG tablet Take 1 tablet (150 mg total) by mouth daily. 30 tablet 1  . levofloxacin (LEVAQUIN) 500 MG tablet Take 1 tablet (500 mg total) by mouth daily. 10 tablet 0  . levothyroxine (SYNTHROID) 100 MCG tablet TAKE 1 TO 1&1/2 TABLETS DAILY AS DIRECTED FOR THYROID 145 tablet 3  . magnesium 30 MG tablet Take 250 mg by mouth 1 day or 1 dose.    . metroNIDAZOLE (METROGEL) 0.75 % vaginal  gel   2  . polyethylene glycol (MIRALAX / GLYCOLAX) packet Take 17 g by mouth daily.    . pravastatin (PRAVACHOL) 40 MG tablet Take 1 tablet (40 mg total) by mouth daily. 90 tablet 1  . ranitidine (ZANTAC) 300 MG tablet Take 1 tablet (300 mg total) by mouth at bedtime. 30 tablet 1   No current facility-administered medications on file prior to visit.   Medical History:  Past Medical History  Diagnosis Date  . Depression   . Hypothyroidism   . HSV-1 infection   . Fibromyalgia   . Preeclampsia   . Hypertension   . Anxiety   . Hemorrhoids   . Constipation   . GERD (gastroesophageal reflux disease)   . Other abnormal glucose   . Anemia    Allergies:  Allergies  Allergen Reactions  . Doxycycline Other (See Comments)    GI UPSET  . Effexor [Venlafaxine] Other (See Comments)    DYSPHORIA  . Prednisone Other (See Comments)    FLUSHED  . Savella [Milnacipran Hcl] Other (See Comments)    NO RELIEF     Review of Systems:  Review of Systems  Constitutional: Positive for malaise/fatigue. Negative for fever, chills, weight loss and diaphoresis.  HENT: Negative.   Eyes: Negative.   Respiratory: Negative.  Negative for cough and shortness of breath.   Cardiovascular: Negative.  Negative for chest pain and claudication.  Gastrointestinal: Negative.   Genitourinary: Negative.   Musculoskeletal: Positive for myalgias. Negative for back pain, joint pain, falls and neck pain.  Skin: Positive for itching and rash.  Neurological: Negative.  Negative for weakness and headaches.  Psychiatric/Behavioral: Positive for depression. Negative for suicidal ideas, hallucinations, memory loss and substance abuse. The patient has insomnia. The patient is not nervous/anxious.     Family history- Review and unchanged Social history- Review and unchanged Physical Exam: BP 110/74 mmHg  Pulse 85  Temp(Src) 98.2 F (36.8 C) (Temporal)  Resp 16  Ht  (1.549 m)  Wt 178 lb (80.74 kg)  BMI 33.65  kg/m2  SpO2 96%  LMP 08/28/2015 Wt Readings from Last 3 Encounters:  09/09/15 178 lb (80.74 kg)  07/30/15 179 lb (81.194 kg)  07/08/15 175 lb (79.379 kg)   General Appearance: Well nourished, in no apparent distress. Eyes: PERRLA, EOMs, conjunctiva no swelling or erythema Sinuses: No Frontal/maxillary tenderness ENT/Mouth: Ext aud canals clear, TMs without erythema, bulging. No erythema, swelling, or exudate on post pharynx.  Tonsils not swollen or erythematous. Hearing normal.  Neck: Supple, thyroid normal.  Respiratory: Respiratory effort normal, BS equal bilaterally without rales, rhonchi, wheezing or stridor.  Cardio: RRR with no MRGs. Brisk peripheral pulses without edema.  Abdomen: Soft, + BS,  Non tender, no guarding, rebound, hernias, masses. Lymphatics: Non tender without lymphadenopathy.  Musculoskeletal: Full ROM, 5/5 strength, Normal gait Skin: erythematous rash perioral, erythematous scaly rash  on nape of neck with excoriations. Warm, dry without rashes, lesions, ecchymosis.  Neuro: Cranial nerves intact. Normal muscle tone, no cerebellar symptoms. Psych: Awake and oriented X 3, normal affect, Insight and Judgment appropriate.    Quentin MullingAmanda Chastin Garlitz, PA-C 4:07 PM [facility]

## 2015-09-09 NOTE — Telephone Encounter (Signed)
Medication problem - Telephone message left for pt to follow up on message received she had been itching more since increase in Lamictal 07/22/15.  Stated she noticed one day when missed the itching was less.  Requested she call back as soon as possible to discuss and informed patient if any rash or ongoing itching she would likely have to stop Lamictal.  Requested she call back before any more use and will inform Dr. Lolly MustacheArfeen of her message left about itching and noticed since increase in Lamictal

## 2015-09-10 LAB — CBC WITH DIFFERENTIAL/PLATELET
BASOS ABS: 0 10*3/uL (ref 0.0–0.1)
Basophils Relative: 0 % (ref 0–1)
EOS PCT: 1 % (ref 0–5)
Eosinophils Absolute: 0.1 10*3/uL (ref 0.0–0.7)
HEMATOCRIT: 40.3 % (ref 36.0–46.0)
Hemoglobin: 13.3 g/dL (ref 12.0–15.0)
LYMPHS PCT: 28 % (ref 12–46)
Lymphs Abs: 2.4 10*3/uL (ref 0.7–4.0)
MCH: 29.2 pg (ref 26.0–34.0)
MCHC: 33 g/dL (ref 30.0–36.0)
MCV: 88.4 fL (ref 78.0–100.0)
MPV: 11 fL (ref 8.6–12.4)
Monocytes Absolute: 0.5 10*3/uL (ref 0.1–1.0)
Monocytes Relative: 6 % (ref 3–12)
NEUTROS PCT: 65 % (ref 43–77)
Neutro Abs: 5.7 10*3/uL (ref 1.7–7.7)
PLATELETS: 214 10*3/uL (ref 150–400)
RBC: 4.56 MIL/uL (ref 3.87–5.11)
RDW: 13.4 % (ref 11.5–15.5)
WBC: 8.7 10*3/uL (ref 4.0–10.5)

## 2015-09-10 LAB — BASIC METABOLIC PANEL WITH GFR
BUN: 15 mg/dL (ref 7–25)
CO2: 29 mmol/L (ref 20–31)
Calcium: 9.4 mg/dL (ref 8.6–10.2)
Chloride: 100 mmol/L (ref 98–110)
Creat: 0.89 mg/dL (ref 0.50–1.10)
GFR, EST NON AFRICAN AMERICAN: 84 mL/min (ref >=60)
Glucose, Bld: 85 mg/dL (ref 65–99)
POTASSIUM: 4.1 mmol/L (ref 3.5–5.3)
Sodium: 138 mmol/L (ref 135–146)

## 2015-09-10 LAB — LIPID PANEL
CHOL/HDL RATIO: 3.5 ratio (ref <=5.0)
CHOLESTEROL: 165 mg/dL (ref 125–200)
HDL: 47 mg/dL (ref >=46)
LDL Cholesterol: 87 mg/dL (ref <130)
TRIGLYCERIDES: 156 mg/dL — AB (ref <150)
VLDL: 31 mg/dL — AB (ref <30)

## 2015-09-10 LAB — HEPATIC FUNCTION PANEL
ALK PHOS: 51 U/L (ref 33–115)
ALT: 18 U/L (ref 6–29)
AST: 15 U/L (ref 10–30)
Albumin: 4.3 g/dL (ref 3.6–5.1)
BILIRUBIN INDIRECT: 0.4 mg/dL (ref 0.2–1.2)
BILIRUBIN TOTAL: 0.5 mg/dL (ref 0.2–1.2)
Bilirubin, Direct: 0.1 mg/dL (ref <=0.2)
Total Protein: 6.9 g/dL (ref 6.1–8.1)

## 2015-09-10 LAB — HEMOGLOBIN A1C
Hgb A1c MFr Bld: 5.6 % (ref <5.7)
Mean Plasma Glucose: 114 mg/dL (ref <117)

## 2015-09-10 LAB — TSH: TSH: 0.491 u[IU]/mL (ref 0.350–4.500)

## 2015-09-10 LAB — VITAMIN D 25 HYDROXY (VIT D DEFICIENCY, FRACTURES): Vit D, 25-Hydroxy: 55 ng/mL (ref 30–100)

## 2015-09-10 LAB — MAGNESIUM: MAGNESIUM: 2 mg/dL (ref 1.5–2.5)

## 2015-09-11 LAB — LAMOTRIGINE LEVEL: Lamotrigine Lvl: 4.2 ug/mL (ref 4.0–18.0)

## 2015-09-13 ENCOUNTER — Other Ambulatory Visit: Payer: Self-pay | Admitting: Emergency Medicine

## 2015-09-14 ENCOUNTER — Ambulatory Visit (HOSPITAL_COMMUNITY): Payer: Self-pay | Admitting: Psychiatry

## 2015-09-15 NOTE — Telephone Encounter (Signed)
Telephone call with patient to follow up on reported problems with itching some and concern due to Lamictal increase.  Patient stated she saw her PCP the same day she called her and they had her go back to 100mg  of Lamictal; however, she only had 150mg  tablets so she reported cutting dosage in 1/2 and is now taking 75mg  a day.  States periodic rash and itching have vastly improved but still appear some and states she has not noticed if the Lamictal is that helpful.  Agreed to inform Dr. Lolly MustacheArfeen of changes to question if should continue and will contact patient back once discussed.  Patient returns for next evaluation on 09/22/15.

## 2015-09-22 ENCOUNTER — Ambulatory Visit (INDEPENDENT_AMBULATORY_CARE_PROVIDER_SITE_OTHER): Payer: BC Managed Care – PPO | Admitting: Psychiatry

## 2015-09-22 ENCOUNTER — Encounter (HOSPITAL_COMMUNITY): Payer: Self-pay | Admitting: Psychiatry

## 2015-09-22 VITALS — BP 127/80 | HR 66 | Ht 61.0 in | Wt 177.0 lb

## 2015-09-22 DIAGNOSIS — F331 Major depressive disorder, recurrent, moderate: Secondary | ICD-10-CM

## 2015-09-22 MED ORDER — LAMOTRIGINE 100 MG PO TABS: 100.0000 mg | ORAL_TABLET | Freq: Every day | ORAL | Status: DC

## 2015-09-22 MED ORDER — FLUOXETINE HCL 40 MG PO CAPS: ORAL_CAPSULE | ORAL | Status: DC

## 2015-09-22 NOTE — Progress Notes (Signed)
[facility] Progress Note  Lauren [name] [id number] 34 y.o.  09/22/2015 5:13 PM  Chief Complaint:  I was itching with increase Lamictal but I'm not feeling better.  I like to go back on Lamictal because it did help.        History of Present Illness:  Lauren [name] came for her follow-up appointment.  On her last visit we tried increase Lamictal and she was taking 150 mg.  She did feel better but she started to have itching and she was recommended to decrease the dose.  She is taking 75 mg.  Her itching is dissolved.  She wants to go back on at least 100 mg because she liked the Lamictal.  She has noticed her irritability, anger, frustration is coming back.  She is getting very easily emotional and tearful.  She has used Xanax on and off which is prescribed by her primary care physician.  She sleeping on and off.  She is not happy with her job.  She is working in the school system.  She is actively looking for a new job.  She denies any suicidal thoughts or homicidal thought but admitted depressed mood, sadness and feeling of hopelessness.  She denies any paranoia or any hallucination.  Her energy level is low.  She admitted stressful job.  She is a special ed.  She lives with her husband and 2 daughter.  Patient denies drinking or using any illegal substances.  She is seeing Lovena Le for counseling.    Suicidal Ideation: No Plan Formed: No Patient has means to carry out plan: No  Homicidal Ideation: No Plan Formed: No Patient has means to carry out plan: No  Past Psychiatric History/Hospitalization(s) She remember taking antidepressant since 2002 when she was in college from primary care physician.  At that time she remembered feeling isolated, withdrawn, having crying spells and decreased energy.  She had tried Effexor and Prozac with good response by her primary care physician.  She also remembered taking Depakote, Zoloft, Celexa, Cymbalta, Wellbutrin and recently Brintellix.  Patient denies any history of abuse in the past.   Anxiety: Yes Bipolar Disorder: No Depression: Yes Mania: No Psychosis: No Schizophrenia: No Personality Disorder: No Hospitalization for psychiatric illness: No History of Electroconvulsive Shock Therapy: No Prior Suicide Attempts: No  Medical History; Patient has history of HSV, hypertension, GERD, hemorrhoids and surgical history of tonsillectomy and cholecystectomy.  She has obesity, fibromyalgia, hypothyroidism and vitamin D deficiency.  Her primary care physician is Dr. Idell Pickles. Patient denies any history of seizures.  Education and Work History; Patient has college education.  She is working as a Agricultural engineer for more than 8 years.  Review of Systems  Musculoskeletal: Negative.   Skin: Negative for itching and rash.  Neurological: Negative for dizziness, tremors and headaches.  Psychiatric/Behavioral: Positive for depression. The patient is nervous/anxious. The patient does not have insomnia.     Psychiatric: Agitation: No Hallucination: No Depressed Mood: Yes Insomnia: No Hypersomnia: No Altered Concentration: No Feels Worthless: No Grandiose Ideas: No Belief In Special Powers: No New/Increased Substance Abuse: No Compulsions: No  Neurologic: Headache: No Seizure: No Paresthesias: No   Musculoskeletal: Strength & Muscle Tone: within normal limits Gait & Station: normal Patient leans: N/A  Outpatient Encounter Prescriptions as of 09/22/2015  Medication Sig Note  . ALPRAZolam (XANAX) 1 MG tablet TAKE 1 TABLET BY MOUTH TWICE A DAY AS NEEDED   . Cholecalciferol (VITAMIN D) 2000 UNITS CAPS Take 8,000 Units  by mouth daily.    . clobetasol (TEMOVATE) 0.05 % external solution Apply 1 application topically 2 (two) times daily.   . diphenhydrAMINE (BENADRYL) 25 MG tablet Take 25 mg by mouth every 6 (six) hours as needed.   Marland Kitchen FLUoxetine (PROZAC) 40 MG capsule TAKE 1 CAPSULE (40 MG TOTAL) BY MOUTH DAILY.   Marland Kitchen  ibuprofen (ADVIL,MOTRIN) 200 MG tablet Take 200 mg by mouth every 6 (six) hours as needed for pain. Takes 4 tablets (800 mg) every 4-6 hrs prn   . lamoTRIgine (LAMICTAL) 100 MG tablet Take 1 tablet (100 mg total) by mouth daily.   Marland Kitchen levothyroxine (SYNTHROID) 100 MCG tablet TAKE 1 TO 1&1/2 TABLETS DAILY AS DIRECTED FOR THYROID   . magnesium 30 MG tablet Take 250 mg by mouth 1 day or 1 dose.   . polyethylene glycol (MIRALAX / GLYCOLAX) packet Take 17 g by mouth daily.   . pravastatin (PRAVACHOL) 40 MG tablet TAKE 1 TABLET BY MOUTH DAILY   . ranitidine (ZANTAC) 300 MG tablet Take 1 tablet (300 mg total) by mouth at bedtime.   . [DISCONTINUED] FLUoxetine (PROZAC) 40 MG capsule TAKE 1 CAPSULE (40 MG TOTAL) BY MOUTH DAILY.   . [DISCONTINUED] lamoTRIgine (LAMICTAL) 100 MG tablet Take 100 mg by mouth daily. 09/22/2015: Received from: External Pharmacy  . [DISCONTINUED] lamoTRIgine (LAMICTAL) 150 MG tablet Take 1 tablet (150 mg total) by mouth daily.   . [DISCONTINUED] levofloxacin (LEVAQUIN) 500 MG tablet Take 1 tablet (500 mg total) by mouth daily.   . [DISCONTINUED] metroNIDAZOLE (METROGEL) 0.75 % vaginal gel  12/01/2014: Received from: External Pharmacy   No facility-administered encounter medications on file as of 09/22/2015.     Recent Results (from the past 2160 hour(s))  Urine culture     Status: None   Collection Time: 07/01/15 11:29 AM  Result Value Ref Range   Colony Count NO GROWTH    Organism ID, Bacteria NO GROWTH   WET PREP BY MOLECULAR PROBE     Status: None   Collection Time: 07/01/15 11:29 AM  Result Value Ref Range   Candida species NEG Negative   Trichomonas vaginosis NEG Negative   Gardnerella vaginalis NEG Negative  Urinalysis, Routine w reflex microscopic (not at [facility])     Status: None   Collection Time: 07/01/15 11:36 AM  Result Value Ref Range   Color, Urine YELLOW YELLOW    Comment: ** Please note change in unit of measure and reference range(s). **      APPearance  CLEAR CLEAR   Specific Gravity, Urine 1.021 1.001 - 1.035   pH 7.5 5.0 - 8.0   Glucose, UA NEGATIVE NEGATIVE   Bilirubin Urine NEGATIVE NEGATIVE   Ketones, ur NEGATIVE NEGATIVE   Hgb urine dipstick NEGATIVE NEGATIVE   Protein, ur NEGATIVE NEGATIVE   Nitrite NEGATIVE NEGATIVE   Leukocytes, UA NEGATIVE NEGATIVE  TSH     Status: None   Collection Time: 07/01/15 11:36 AM  Result Value Ref Range   TSH 3.880 0.350 - 4.500 uIU/mL  POCT urinalysis dipstick     Status: None   Collection Time: 07/08/15 11:37 AM  Result Value Ref Range   Color, UA     Clarity, UA     Glucose, UA neg    Bilirubin, UA     Ketones, UA neg    Spec Grav, UA     Blood, UA large    pH, UA     Protein, UA neg    Urobilinogen,  UA     Nitrite, UA neg    Leukocytes, UA Negative Negative  POCT urine pregnancy     Status: None   Collection Time: 07/08/15 11:37 AM  Result Value Ref Range   Preg Test, Ur Negative Negative  POCT Wet Prep [facility])     Status: Abnormal   Collection Time: 07/08/15 12:14 PM  Result Value Ref Range   Source Wet Prep POC vaginal    WBC, Wet Prep HPF POC none    Bacteria Wet Prep HPF POC None None, Few   BACTERIA WET PREP MORPHOLOGY POC     Clue Cells Wet Prep HPF POC Moderate (A) None   Clue Cells Wet Prep Whiff POC Positive Whiff    Yeast Wet Prep HPF POC None    KOH Wet Prep POC     Trichomonas Wet Prep HPF POC none   TSH     Status: None   Collection Time: 07/30/15  5:01 PM  Result Value Ref Range   TSH 1.222 0.350 - 4.500 uIU/mL  CBC with Differential/Platelet     Status: None   Collection Time: 09/09/15  4:43 PM  Result Value Ref Range   WBC 8.7 4.0 - 10.5 K/uL   RBC 4.56 3.87 - 5.11 MIL/uL   Hemoglobin 13.3 12.0 - 15.0 g/dL   HCT 40.3 36.0 - 46.0 %   MCV 88.4 78.0 - 100.0 fL   MCH 29.2 26.0 - 34.0 pg   MCHC 33.0 30.0 - 36.0 g/dL   RDW 13.4 11.5 - 15.5 %   Platelets 214 150 - 400 K/uL   MPV 11.0 8.6 - 12.4 fL   Neutrophils Relative % 65 43 - 77 %   Neutro Abs  5.7 1.7 - 7.7 K/uL   Lymphocytes Relative 28 12 - 46 %   Lymphs Abs 2.4 0.7 - 4.0 K/uL   Monocytes Relative 6 3 - 12 %   Monocytes Absolute 0.5 0.1 - 1.0 K/uL   Eosinophils Relative 1 0 - 5 %   Eosinophils Absolute 0.1 0.0 - 0.7 K/uL   Basophils Relative 0 0 - 1 %   Basophils Absolute 0.0 0.0 - 0.1 K/uL   Smear Review Criteria for review not met   BASIC METABOLIC PANEL WITH GFR     Status: None   Collection Time: 09/09/15  4:43 PM  Result Value Ref Range   Sodium 138 135 - 146 mmol/L   Potassium 4.1 3.5 - 5.3 mmol/L   Chloride 100 98 - 110 mmol/L   CO2 29 20 - 31 mmol/L   Glucose, Bld 85 65 - 99 mg/dL   BUN 15 7 - 25 mg/dL   Creat 0.89 0.50 - 1.10 mg/dL   Calcium 9.4 8.6 - 10.2 mg/dL   GFR, Est African American >89 >=60 mL/min   GFR, Est Non African American 84 >=60 mL/min    Comment:   The estimated GFR is a calculation valid for adults (>=43 years old) that uses the CKD-EPI algorithm to adjust for age and sex. It is   not to be used for children, pregnant women, hospitalized patients,    patients on dialysis, or with rapidly changing kidney function. According to the NKDEP, eGFR >89 is normal, 60-89 shows mild impairment, 30-59 shows moderate impairment, 15-29 shows severe impairment and <15 is ESRD.     Hepatic function panel     Status: None   Collection Time: 09/09/15  4:43 PM  Result Value Ref Range  Total Bilirubin 0.5 0.2 - 1.2 mg/dL   Bilirubin, Direct 0.1 <=0.2 mg/dL   Indirect Bilirubin 0.4 0.2 - 1.2 mg/dL   Alkaline Phosphatase 51 33 - 115 U/L   AST 15 10 - 30 U/L   ALT 18 6 - 29 U/L   Total Protein 6.9 6.1 - 8.1 g/dL   Albumin 4.3 3.6 - 5.1 g/dL  TSH     Status: None   Collection Time: 09/09/15  4:43 PM  Result Value Ref Range   TSH 0.491 0.350 - 4.500 uIU/mL  Lipid panel     Status: Abnormal   Collection Time: 09/09/15  4:43 PM  Result Value Ref Range   Cholesterol 165 125 - 200 mg/dL   Triglycerides 156 (H) <150 mg/dL   HDL 47 >=46 mg/dL   Total  CHOL/HDL Ratio 3.5 <=5.0 Ratio   VLDL 31 (H) <30 mg/dL   LDL Cholesterol 87 <130 mg/dL    Comment:   Total Cholesterol/HDL Ratio:CHD Risk                        Coronary Heart Disease Risk Table                                        Men       Women          1/2 Average Risk              3.4        3.3              Average Risk              5.0        4.4           2X Average Risk              9.6        7.1           3X Average Risk             23.4       11.0 Use the calculated Patient Ratio above and the CHD Risk table  to determine the patient's CHD Risk.   Hemoglobin A1c     Status: None   Collection Time: 09/09/15  4:43 PM  Result Value Ref Range   Hgb A1c MFr Bld 5.6 <5.7 %    Comment:                                                                        According to the ADA Clinical Practice Recommendations for 2011, when HbA1c is used as a screening test:     >=6.5%   Diagnostic of Diabetes Mellitus            (if abnormal result is confirmed)   5.7-6.4%   Increased risk of developing Diabetes Mellitus   References:Diagnosis and Classification of Diabetes Mellitus,Diabetes XBJY,7829,56(OZHYQ 1):S62-S69 and Standards of Medical Care in         Diabetes - 2011,Diabetes Care,2011,34 (Suppl 1):S11-S61.      Mean  Plasma Glucose 114 <117 mg/dL  Magnesium     Status: None   Collection Time: 09/09/15  4:43 PM  Result Value Ref Range   Magnesium 2.0 1.5 - 2.5 mg/dL  Vit D  25 hydroxy (rtn osteoporosis monitoring)     Status: None   Collection Time: 09/09/15  4:43 PM  Result Value Ref Range   Vit D, 25-Hydroxy 55 30 - 100 ng/mL    Comment: Vitamin D Status           25-OH Vitamin D        Deficiency                <20 ng/mL        Insufficiency         20 - 29 ng/mL        Optimal             > or = 30 ng/mL   For 25-OH Vitamin D testing on patients on D2-supplementation and patients for whom quantitation of D2 and D3 fractions is required, the Quest[facility]ureD 25-OH VIT  D, (D2,D3), LC/MS/MS is recommended: order code [id number] (patients > 2 yrs).   Lamotrigine level     Status: None   Collection Time: 09/09/15  4:43 PM  Result Value Ref Range   Lamotrigine Lvl 4.2 4.0 - 18.0 mcg/mL      Constitutional:  BP 127/80 mmHg  Pulse 66  Ht 5' 1"  (1.549 m)  Wt 177 lb (80.287 kg)  BMI 33.46 kg/m2  LMP 08/28/2015   Mental Status Examination;  Patient is well groomed well dressed female who appears to be in her stated age.  She is anxious but cooperative  She maintained fair eye contact.  Her speech is soft, clear, coherent with normal tone and volume.  She described her mood sad and depressed.  Her affect is constricted.   Her thought process slow but logical and goal-directed.  There were no flight of ideas or any loose association.  Her psychomotor activity is slow.  Her fund of knowledge is good.  She denies any auditory or visual hallucination.  She denies any active or passive suicidal thoughts or homicidal thought.  There were no delusions, paranoia or any obsessive thoughts.  Her cognition is good.  She's alert and oriented 3.  There were no tremors or shakes.  Her insight judgment and impulse control is okay.   Established Problem, Stable/Improving (1), Review of Psycho-Social Stressors (1), Established Problem, Worsening (2), Review of Last Therapy Session (1), Review of Medication Regimen & Side Effects (2) and Review of New Medication or Change in Dosage (2)  Assessment: Axis I: Major depressive disorder, recurrent  Axis II: Deferred  Axis III:  Past Medical History  Diagnosis Date  . Depression   . Hypothyroidism   . HSV-1 infection   . Fibromyalgia   . Preeclampsia   . Hypertension   . Anxiety   . Hemorrhoids   . Constipation   . GERD (gastroesophageal reflux disease)   . Other abnormal glucose   . Anemia      Plan:  Discussed medication side effects in detail.  Patient is no longer having itching or rash.  She like to go back on 100  mg Lamictal.  She is taking Prozac 40 mg daily.  She had tried multiple antidepressant including Effexor.  However she never tried Pristiq , Tegretol or lithium.  We will try Lamictal 100 mg and if she started to  have itching or rash then we will try either Pristiq, low-dose [name] or Tegretol.  Continue Prozac 40 mg daily .Recommended to continue therapy with Lovena Le for counseling.  Discuss safety plan that anytime having active suicidal thoughts or homicidal thought and she need to call 911 or go to the local emergency room.  Patient is getting Xanax 1 mg half tablet from primary care physician.  Follow-up in 2 months.   Ercia Crisafulli T., MD 09/22/2015

## 2015-09-25 ENCOUNTER — Ambulatory Visit (INDEPENDENT_AMBULATORY_CARE_PROVIDER_SITE_OTHER): Payer: BC Managed Care – PPO | Admitting: Endocrinology

## 2015-09-25 ENCOUNTER — Encounter: Payer: Self-pay | Admitting: Endocrinology

## 2015-09-25 VITALS — BP 122/88 | HR 71 | Temp 98.4°F | Resp 14 | Ht 61.0 in | Wt 178.2 lb

## 2015-09-25 DIAGNOSIS — E038 Other specified hypothyroidism: Secondary | ICD-10-CM | POA: Diagnosis not present

## 2015-09-25 DIAGNOSIS — F32A Depression, unspecified: Secondary | ICD-10-CM

## 2015-09-25 DIAGNOSIS — F329 Major depressive disorder, single episode, unspecified: Secondary | ICD-10-CM

## 2015-09-25 DIAGNOSIS — E063 Autoimmune thyroiditis: Secondary | ICD-10-CM

## 2015-09-25 DIAGNOSIS — R5383 Other fatigue: Secondary | ICD-10-CM | POA: Diagnosis not present

## 2015-09-25 MED ORDER — THYROID 90 MG PO TABS: 90.0000 mg | ORAL_TABLET | Freq: Every day | ORAL | Status: DC

## 2015-09-25 NOTE — Progress Notes (Signed)
Patient ID: Lauren PettiesMandy E [name], female   DOB: 10/24/1981, 34 y.o.   MRN: [id number]            Reason for Appointment:  Hypothyroidism, new visit    History of Present Illness:   The Hypothyroidism was first diagnosed in 2001  She is not sure what symptoms she had been she was diagnosed but apparently was having some difficulties with weight gain She does not remember if she felt any better with starting supplementation initially She has been followed by her PCP with periodic adjustments of her thyroid dosage Review of her records indicate that her thyroid levels have been normal since at least 2005  Currently the patient is complaining about fatigue and low motivation She has been feeling somewhat worse in the last month and apparently has had more stress and symptoms of depression She does not complain of any cold sensitivity, difficulty concentrating, dry skin or hair loss She tends to feel hot and cold at different times  Over the last year she has gained about 25 pounds More recently the patient has been treated with levothyroxine with 100 g on 3 days a week and 150 on 4 days Since her TSH was low normal in October she was told to take the 150 g only on 3 days a week This is equivalent to an average daily dose of 122 g   The symptoms consistent with hypothyroidism are: fatigue,  weight gain and hair loss .           Wt Readings from Last 3 Encounters:  09/25/15 178 lb 3.2 oz (80.831 kg)  09/22/15 177 lb (80.287 kg)  09/09/15 178 lb (80.74 kg)    Lab Results  Component Value Date   TSH 0.491 09/09/2015   TSH 1.222 07/30/2015   TSH 3.880 07/01/2015   Lab Results  Component Value Date   TSH 0.491 09/09/2015   TSH 1.222 07/30/2015   TSH 3.880 07/01/2015   TSH 3.516 06/02/2015   TSH 3.881 01/28/2015   TSH 2.128 11/05/2014   TSH 2.336 09/17/2014   TSH 3.278 07/03/2014      Past Medical History  Diagnosis Date  . Depression   . Hypothyroidism   . HSV-1  infection   . Fibromyalgia   . Preeclampsia   . Hypertension   . Anxiety   . Hemorrhoids   . Constipation   . GERD (gastroesophageal reflux disease)   . Other abnormal glucose   . Anemia     Past Surgical History  Procedure Laterality Date  . Cholecystectomy  2006  . Tonsillectomy    . Wisdom tooth extraction      Family History  Problem Relation Age of Onset  . Heart disease Maternal Grandmother   . Diabetes Maternal Grandmother   . Cancer Maternal Grandmother     SKIN  . Hyperlipidemia Maternal Grandmother   . Hypertension Maternal Grandmother   . Hypothyroidism Maternal Grandmother   . Hypothyroidism Mother   . Cervical cancer Mother   . Bipolar disorder Mother   . Diabetes Mother   . Colon polyps Mother   . Bipolar disorder Brother   . Kidney disease Brother   . Alcohol abuse Brother   . Lung cancer Maternal Grandfather   . Heart disease Maternal Aunt   . Diabetes Maternal Aunt   . Diabetes Cousin   . Colon cancer Neg Hx   . Hypothyroidism Daughter     Social History:  reports that she has never  smoked. She has never used smokeless tobacco. She reports that she does not drink alcohol or use illicit drugs.  Allergies:  Allergies  Allergen Reactions  . Doxycycline Other (See Comments)    GI UPSET  . Effexor [Venlafaxine] Other (See Comments)    DYSPHORIA  . Prednisone Other (See Comments)    FLUSHED  . Savella [Milnacipran Hcl] Other (See Comments)    NO RELIEF      Medication List       This list is accurate as of: 09/25/15 11:59 PM.  Always use your most recent med list.               ALPRAZolam 1 MG tablet  Commonly known as:  XANAX  TAKE 1 TABLET BY MOUTH TWICE A DAY AS NEEDED     clobetasol 0.05 % external solution  Commonly known as:  TEMOVATE  Apply 1 application topically 2 (two) times daily.     FLUoxetine 40 MG capsule  Commonly known as:  PROZAC  TAKE 1 CAPSULE (40 MG TOTAL) BY MOUTH DAILY.     ibuprofen 200 MG tablet    Commonly known as:  ADVIL,MOTRIN  Take 200 mg by mouth every 6 (six) hours as needed for pain. Takes 4 tablets (800 mg) every 4-6 hrs prn     lamoTRIgine 100 MG tablet  Commonly known as:  LAMICTAL  Take 1 tablet (100 mg total) by mouth daily.     levofloxacin 500 MG tablet  Commonly known as:  LEVAQUIN  Take 500 mg by mouth daily.     levothyroxine 100 MCG tablet  Commonly known as:  SYNTHROID  TAKE 1 TO 1&1/2 TABLETS DAILY AS DIRECTED FOR THYROID     magnesium 30 MG tablet  Take 250 mg by mouth 1 day or 1 dose.     metroNIDAZOLE 0.75 % vaginal gel  Commonly known as:  METROGEL  PLACE 1 APPLICATORFUL VAGINALLY AT BEDTIME. FOR 5 DAYS     polyethylene glycol packet  Commonly known as:  MIRALAX / GLYCOLAX  Take 17 g by mouth daily.     pravastatin 40 MG tablet  Commonly known as:  PRAVACHOL  TAKE 1 TABLET BY MOUTH DAILY     ranitidine 300 MG tablet  Commonly known as:  ZANTAC  Take 1 tablet (300 mg total) by mouth at bedtime.     thyroid 90 MG tablet  Commonly known as:  ARMOUR THYROID  Take 1 tablet (90 mg total) by mouth daily.     Vitamin D 2000 UNITS Caps  Take 8,000 Units by mouth daily.        Review of Systems  Constitutional: Positive for malaise/fatigue. Negative for weight loss.  Gastrointestinal: Positive for heartburn.  Musculoskeletal: Positive for myalgias.  Endo/Heme/Allergies: Negative for polydipsia.  Psychiatric/Behavioral: Positive for depression.   She thinks her appetite has been decreased lately  She has normal menstrual cycles              Examination:    BP 122/88 mmHg  Pulse 71  Temp(Src) 98.4 F (36.9 C)  Resp 14  Ht  (1.549 m)  Wt 178 lb 3.2 oz (80.831 kg)  BMI 33.69 kg/m2  SpO2 98%  LMP 08/28/2015  GENERAL:  Average build.  No cushingoid features on exam.  Generalized obesity present  No pallor, clubbing, lymphadenopathy or edema.  Skin:  no rash, hirsutism or pigmentation.  EYES:  No prominence of the eyes  or swelling of the  eyelids  ENT: Oral mucosa and tongue normal.  THYROID:  Not palpable.  HEART:  Normal  S1 and S2; no murmur or click.  CHEST:    Lungs: Vescicular breath sounds heard equally.  No crepitations/ wheeze.  ABDOMEN:  No distention.  Liver and spleen not palpable.  No other mass or tenderness.  NEUROLOGICAL: Reflexes are bilaterally normal at biceps and ankles.  JOINTS:  Normal.   Assessments 1. 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  Long-standing depression which appears to be worse now  3.  History of fibromyalgia  4.  Other medical problems including hypercholesterolemia, reflux and skin rash  Treatment:  Since the patient has been euthyroid with continued symptoms of fatigue, depression and weight gain she may benefit from a trial of Armour Thyroid instead of levothyroxine Explained the differences between levothyroxine and Armour Thyroid which has a combination of T4 and T3 and physiologically more active T3 component which may benefit some patients  Since her TSH is recently normal will give her 90 mg of Armour Thyroid to take daily which will be roughly equivalent to her average daily dose of 122 g of levothyroxine She will also take this once a day in the morning daily before eating  She will follow-up in 6 weeks for TSH and will adjust her dosage as needed She will have her [facility] her work If she has no benefit from switching to Armour Thyroid may go back to levothyroxine with 125 g daily.  Also recommended following up with her psychiatrist and PCP for continuing management of other  issues including optimization of her antidepressants and treatment of insomnia    Launa Goedken 09/26/2015, 3:32 PM    Note: This office note was prepared with Dragon voice recognition system technology. Any transcriptional errors that result from this process are unintentional.

## 2015-09-27 ENCOUNTER — Other Ambulatory Visit (HOSPITAL_COMMUNITY): Payer: Self-pay | Admitting: Psychiatry

## 2015-09-28 ENCOUNTER — Other Ambulatory Visit (HOSPITAL_COMMUNITY): Payer: Self-pay | Admitting: Psychiatry

## 2015-10-07 ENCOUNTER — Ambulatory Visit (INDEPENDENT_AMBULATORY_CARE_PROVIDER_SITE_OTHER): Payer: BC Managed Care – PPO | Admitting: Physician Assistant

## 2015-10-07 ENCOUNTER — Encounter: Payer: Self-pay | Admitting: Physician Assistant

## 2015-10-07 VITALS — BP 100/90 | HR 88 | Temp 98.1°F | Resp 16 | Ht 61.0 in | Wt 181.0 lb

## 2015-10-07 DIAGNOSIS — E039 Hypothyroidism, unspecified: Secondary | ICD-10-CM

## 2015-10-07 DIAGNOSIS — I10 Essential (primary) hypertension: Secondary | ICD-10-CM

## 2015-10-07 LAB — CBC WITH DIFFERENTIAL/PLATELET
Basophils Absolute: 0 10*3/uL (ref 0.0–0.1)
Basophils Relative: 0 % (ref 0–1)
EOS ABS: 0.1 10*3/uL (ref 0.0–0.7)
Eosinophils Relative: 1 % (ref 0–5)
HCT: 36.4 % (ref 36.0–46.0)
HEMOGLOBIN: 12.3 g/dL (ref 12.0–15.0)
LYMPHS ABS: 2.4 10*3/uL (ref 0.7–4.0)
Lymphocytes Relative: 25 % (ref 12–46)
MCH: 29.5 pg (ref 26.0–34.0)
MCHC: 33.8 g/dL (ref 30.0–36.0)
MCV: 87.3 fL (ref 78.0–100.0)
MONOS PCT: 6 % (ref 3–12)
MPV: 10.4 fL (ref 8.6–12.4)
Monocytes Absolute: 0.6 10*3/uL (ref 0.1–1.0)
NEUTROS PCT: 68 % (ref 43–77)
Neutro Abs: 6.4 10*3/uL (ref 1.7–7.7)
Platelets: 193 10*3/uL (ref 150–400)
RBC: 4.17 MIL/uL (ref 3.87–5.11)
RDW: 13.5 % (ref 11.5–15.5)
WBC: 9.4 10*3/uL (ref 4.0–10.5)

## 2015-10-07 LAB — COMPREHENSIVE METABOLIC PANEL
ALBUMIN: 4.2 g/dL (ref 3.6–5.1)
ALK PHOS: 49 U/L (ref 33–115)
ALT: 19 U/L (ref 6–29)
AST: 16 U/L (ref 10–30)
BILIRUBIN TOTAL: 0.4 mg/dL (ref 0.2–1.2)
BUN: 14 mg/dL (ref 7–25)
CALCIUM: 9.1 mg/dL (ref 8.6–10.2)
CO2: 28 mmol/L (ref 20–31)
Chloride: 101 mmol/L (ref 98–110)
Creat: 0.63 mg/dL (ref 0.50–1.10)
GLUCOSE: 103 mg/dL — AB (ref 65–99)
POTASSIUM: 4.1 mmol/L (ref 3.5–5.3)
Sodium: 135 mmol/L (ref 135–146)
TOTAL PROTEIN: 6.5 g/dL (ref 6.1–8.1)

## 2015-10-07 MED ORDER — VERAPAMIL HCL ER 120 MG PO TBCR: 120.0000 mg | EXTENDED_RELEASE_TABLET | Freq: Every day | ORAL | Status: DC

## 2015-10-07 NOTE — Progress Notes (Signed)
Assessment and Plan: 1. Hypothyroidism, unspecified hypothyroidism type ? Elevated BP/symptoms from armour thyroid, gave print out to patient of why we do not prescribe armour thyroid here.  - TSH  2. Essential hypertension Normal neuro, check labs, add verapamil 120 at night for BP/Anxiety/HA Follow up 2 weeks.  - CBC with Differential/Platelet - Comprehensive metabolic panel    HPI 34 y.o.female presents for BP check, has been elevated x Thursday night. She states that her BP has been elevated recently at home. Thursday at home her left arm was aching, with heart burn, had left leg pain as well, felt light headed, got better after going to bed. She went on a retreat and states that her BP went elevated then. She has been feeling warm recently, having headache at frontal sinuses. This AM has had some sore throat, sinus drainage but otherwise no sinus issues recently. Has constant HA frontal, without changes in her vision or speech. She has had recent switch from thyroid med to armour thyroid, last week.  Lab Results  Component Value Date   TSH 0.491 09/09/2015    Past Medical History  Diagnosis Date  . Depression   . Hypothyroidism   . HSV-1 infection   . Fibromyalgia   . Preeclampsia   . Hypertension   . Anxiety   . Hemorrhoids   . Constipation   . GERD (gastroesophageal reflux disease)   . Other abnormal glucose   . Anemia      Allergies  Allergen Reactions  . Doxycycline Other (See Comments)    GI UPSET  . Effexor [Venlafaxine] Other (See Comments)    DYSPHORIA  . Prednisone Other (See Comments)    FLUSHED  . Savella [Milnacipran Hcl] Other (See Comments)    NO RELIEF      Current Outpatient Prescriptions on File Prior to Visit  Medication Sig Dispense Refill  . ALPRAZolam (XANAX) 1 MG tablet TAKE 1 TABLET BY MOUTH TWICE A DAY AS NEEDED 60 tablet 0  . Cholecalciferol (VITAMIN D) 2000 UNITS CAPS Take 8,000 Units by mouth daily.     . clobetasol (TEMOVATE) 0.05  % external solution Apply 1 application topically 2 (two) times daily. 50 mL 0  . FLUoxetine (PROZAC) 40 MG capsule TAKE 1 CAPSULE (40 MG TOTAL) BY MOUTH DAILY. 30 capsule 1  . ibuprofen (ADVIL,MOTRIN) 200 MG tablet Take 200 mg by mouth every 6 (six) hours as needed for pain. Takes 4 tablets (800 mg) every 4-6 hrs prn    . lamoTRIgine (LAMICTAL) 100 MG tablet Take 1 tablet (100 mg total) by mouth daily. 30 tablet 1  . levofloxacin (LEVAQUIN) 500 MG tablet Take 500 mg by mouth daily.  0  . magnesium 30 MG tablet Take 250 mg by mouth 1 day or 1 dose.    . metroNIDAZOLE (METROGEL) 0.75 % vaginal gel PLACE 1 APPLICATORFUL VAGINALLY AT BEDTIME. FOR 5 DAYS  2  . polyethylene glycol (MIRALAX / GLYCOLAX) packet Take 17 g by mouth daily.    . pravastatin (PRAVACHOL) 40 MG tablet TAKE 1 TABLET BY MOUTH DAILY 90 tablet 1  . ranitidine (ZANTAC) 300 MG tablet Take 1 tablet (300 mg total) by mouth at bedtime. 30 tablet 1  . thyroid (ARMOUR THYROID) 90 MG tablet Take 1 tablet (90 mg total) by mouth daily. 30 tablet 2   No current facility-administered medications on file prior to visit.    ROS: all negative except above.   Physical Exam: Filed Weights   10/07/15 1430  Weight: 181 lb (82.101 kg)   BP 100/90 mmHg  Pulse 88  Temp(Src) 98.1 F (36.7 C) (Temporal)  Resp 16  Ht  (1.549 m)  Wt 181 lb (82.101 kg)  BMI 34.22 kg/m2  SpO2 99%  LMP 08/28/2015 General Appearance: Well nourished, in no apparent distress. Eyes: PERRLA, EOMs, conjunctiva no swelling or erythema Sinuses: No Frontal/maxillary tenderness ENT/Mouth: Ext aud canals clear, TMs without erythema, bulging. No erythema, swelling, or exudate on post pharynx.  Tonsils not swollen or erythematous. Hearing normal.  Neck: Supple, thyroid normal.  Respiratory: Respiratory effort normal, BS equal bilaterally without rales, rhonchi, wheezing or stridor.  Cardio: RRR with no MRGs. Brisk peripheral pulses without edema.  Abdomen: Soft, +  BS.  Non tender, no guarding, rebound, hernias, masses. Lymphatics: Non tender without lymphadenopathy.  Musculoskeletal: Full ROM, 5/5 strength, normal gait.  Skin: Warm, dry without rashes, lesions, ecchymosis.  Neuro: Cranial nerves intact. Normal muscle tone, no cerebellar symptoms. Sensation intact.  Psych: Awake and oriented X 3, normal affect, Insight and Judgment appropriate.     Quentin Mulling, PA-C 2:41 PM [facility]

## 2015-10-07 NOTE — Patient Instructions (Addendum)
Why we do not prescribe Armour Thyroid: 1) Armour is purified porcine (pig) thyroid glands, which is NOT without risk for contaminants.  2) The ratio between T3 and T4 in Armour thyroid is physiologic for pigs, NOT for humans.  3) The short half life of T3 can cause fluctuations in blood levels, which can result in mood swings and heart rhythm abnormalities.  4) the concentration of the active substances (T4 and T3) can be expected to vary between different Armour lots, which can cause variation in the thyroid function tests.   Monitor your blood pressure at home. Go to the ER if any CP, SOB, nausea, dizziness, severe HA, changes vision/speech  Goal BP:  For patients younger than 60: Goal BP < 140/90. For patients 60 and older: Goal BP < 150/90. For patients with diabetes: Goal BP < 140/90. Your most recent BP: BP: 100/90 mmHg   Take your medications faithfully as instructed. Maintain a healthy weight. Get at least 150 minutes of aerobic exercise per week. Minimize salt intake. Minimize alcohol intake  DASH Eating Plan DASH stands for "Dietary Approaches to Stop Hypertension." The DASH eating plan is a healthy eating plan that has been shown to reduce high blood pressure (hypertension). Additional health benefits may include reducing the risk of type 2 diabetes mellitus, heart disease, and stroke. The DASH eating plan may also help with weight loss. WHAT DO I NEED TO KNOW ABOUT THE DASH EATING PLAN? For the DASH eating plan, you will follow these general guidelines:  Choose foods with a percent daily value for sodium of less than 5% (as listed on the food label).  Use salt-free seasonings or herbs instead of table salt or sea salt.  Check with your health care provider or pharmacist before using salt substitutes.  Eat lower-sodium products, often labeled as "lower sodium" or "no salt added."  Eat fresh foods.  Eat more vegetables, fruits, and low-fat dairy products.  Choose whole  grains. Look for the word "whole" as the first word in the ingredient list.  Choose fish and skinless chicken or [place]key more often than red meat. Limit fish, poultry, and meat to 6 oz (170 g) each day.  Limit sweets, desserts, sugars, and sugary drinks.  Choose heart-healthy fats.  Limit cheese to 1 oz (28 g) per day.  Eat more home-cooked food and less restaurant, buffet, and fast food.  Limit fried foods.  Cook foods using methods other than frying.  Limit canned vegetables. If you do use them, rinse them well to decrease the sodium.  When eating at a restaurant, ask that your food be prepared with less salt, or no salt if possible. WHAT FOODS CAN I EAT? Seek help from a dietitian for individual calorie needs. Grains Whole grain or whole wheat bread. Brown rice. Whole grain or whole wheat pasta. Quinoa, bulgur, and whole grain cereals. Low-sodium cereals. Corn or whole wheat flour tortillas. Whole grain cornbread. Whole grain crackers. Low-sodium crackers. Vegetables Fresh or frozen vegetables (raw, steamed, roasted, or grilled). Low-sodium or reduced-sodium tomato and vegetable juices. Low-sodium or reduced-sodium tomato sauce and paste. Low-sodium or reduced-sodium canned vegetables.  Fruits All fresh, canned (in natural juice), or frozen fruits. Meat and Other Protein Products Ground beef (85% or leaner), grass-fed beef, or beef trimmed of fat. Skinless chicken or [place]key. Ground chicken or [place]key. Pork trimmed of fat. All fish and seafood. Eggs. Dried beans, peas, or lentils. Unsalted nuts and seeds. Unsalted canned beans. Dairy Low-fat dairy products, such as  skim or 1% milk, 2% or reduced-fat cheeses, low-fat ricotta or cottage cheese, or plain low-fat yogurt. Low-sodium or reduced-sodium cheeses. Fats and Oils Tub margarines without trans fats. Light or reduced-fat mayonnaise and salad dressings (reduced sodium). Avocado. Safflower, olive, or canola oils. Natural peanut or  almond butter. Other Unsalted popcorn and pretzels. The items listed above may not be a complete list of recommended foods or beverages. Contact your dietitian for more options. WHAT FOODS ARE NOT RECOMMENDED? Grains White bread. White pasta. White rice. Refined cornbread. Bagels and croissants. Crackers that contain trans fat. Vegetables Creamed or fried vegetables. Vegetables in a cheese sauce. Regular canned vegetables. Regular canned tomato sauce and paste. Regular tomato and vegetable juices. Fruits Dried fruits. Canned fruit in light or heavy syrup. Fruit juice. Meat and Other Protein Products Fatty cuts of meat. Ribs, chicken wings, bacon, sausage, bologna, salami, chitterlings, fatback, hot dogs, bratwurst, and packaged luncheon meats. Salted nuts and seeds. Canned beans with salt. Dairy Whole or 2% milk, cream, half-and-half, and cream cheese. Whole-fat or sweetened yogurt. Full-fat cheeses or blue cheese. Nondairy creamers and whipped toppings. Processed cheese, cheese spreads, or cheese curds. Condiments Onion and garlic salt, seasoned salt, table salt, and sea salt. Canned and packaged gravies. Worcestershire sauce. Tartar sauce. Barbecue sauce. Teriyaki sauce. Soy sauce, including reduced sodium. Steak sauce. Fish sauce. Oyster sauce. Cocktail sauce. Horseradish. Ketchup and mustard. Meat flavorings and tenderizers. Bouillon cubes. Hot sauce. Tabasco sauce. Marinades. Taco seasonings. Relishes. Fats and Oils Butter, stick margarine, lard, shortening, ghee, and bacon fat. Coconut, palm kernel, or palm oils. Regular salad dressings. Other Pickles and olives. Salted popcorn and pretzels. The items listed above may not be a complete list of foods and beverages to avoid. Contact your dietitian for more information. WHERE CAN I FIND MORE INFORMATION? National Heart, Lung, and Blood Institute: [email or web address] Document Released: 10/27/2011 Document  Revised: 03/24/2014 Document Reviewed: 09/11/2013 [facility] Patient Information 2015 [place], [place]. This information is not intended to replace advice given to you by your health care provider. Make sure you discuss any questions you have with your health care provider.

## 2015-10-08 LAB — TSH: TSH: 0.31 u[IU]/mL — ABNORMAL LOW (ref 0.350–4.500)

## 2015-10-20 ENCOUNTER — Telehealth: Payer: Self-pay | Admitting: Obstetrics and Gynecology

## 2015-10-20 NOTE — Telephone Encounter (Signed)
Pt called stating that she is on her period and that she had heavy bleeding for about 3.5 hours and had to change her pad every 30 minutes and bled through and messed up her clothes 3 different times. Pt stated that this is the first time this has happened. Pt stated that she has noticed that her periods have been getting heavier. I advised the pt that we could get her an appointment to discuss the issue with a provider or if she wanted to wait and see if the problem happened again we could work her in then. The pt stated that since this was the first time this has ever happened that she would just wait and see if it happens again and call us if it does. Pt was also advised that if the heavy bleeding started again to call our office or go to the ER. Pt verbalized understanding.

## 2015-11-04 ENCOUNTER — Ambulatory Visit: Payer: Self-pay | Admitting: Endocrinology

## 2015-11-26 ENCOUNTER — Ambulatory Visit (INDEPENDENT_AMBULATORY_CARE_PROVIDER_SITE_OTHER): Payer: BC Managed Care – PPO | Admitting: Psychiatry

## 2015-11-26 ENCOUNTER — Encounter (HOSPITAL_COMMUNITY): Payer: Self-pay | Admitting: Psychiatry

## 2015-11-26 VITALS — BP 128/79 | HR 77 | Ht 61.0 in | Wt 178.0 lb

## 2015-11-26 DIAGNOSIS — F331 Major depressive disorder, recurrent, moderate: Secondary | ICD-10-CM

## 2015-11-26 MED ORDER — LAMOTRIGINE 100 MG PO TABS: 100.0000 mg | ORAL_TABLET | Freq: Every day | ORAL | Status: DC

## 2015-11-26 MED ORDER — FLUOXETINE HCL 40 MG PO CAPS: ORAL_CAPSULE | ORAL | Status: DC

## 2015-11-26 NOTE — Progress Notes (Signed)
[name] Progress Note  Lauren [name] [id number] 35 y.o.  11/26/2015 4:48 PM  Chief Complaint:  I'm doing better with low-dose Lamictal.  I have no rash or itching.          History of Present Illness:  Lauren [name] came for her follow-up appointment.  She is taking Lamictal 100 mg which was reduce as 150 was causing or itching.  She is doing much better.  She denies any irritability, anger or any mood swings.  She had a good Christmas.  She is less emotional and tearful.  She continues to take half Xanax as needed which is prescribed by her primary care physician.  Her energy level is good.  She had no complaints with the medication.  She has no tremors or shakes.  Though she is not happy with her job but she is handling better.  Patient is a Pharmacist, [employer] .  She lives with her husband and 2 daughter.  Patient denies drinking or using any illegal substances.  She is seeing therapist for counseling.  Her appetite is okay.  Her vitals are stable.  Suicidal Ideation: No Plan Formed: No Patient has means to carry out plan: No  Homicidal Ideation: No Plan Formed: No Patient has means to carry out plan: No  Past Psychiatric History/Hospitalization(s) She remember taking antidepressant since 2002 when she was in college from primary care physician.  At that time she remembered feeling isolated, withdrawn, having crying spells and decreased energy.  She had tried Effexor and Prozac with good response by her primary care physician.  She also remembered taking Depakote, Zoloft, Celexa, Cymbalta, Wellbutrin and recently Brintellix. Patient denies any history of abuse in the past.   Anxiety: Yes Bipolar Disorder: No Depression: Yes Mania: No Psychosis: No Schizophrenia: No Personality Disorder: No Hospitalization for psychiatric illness: No History of Electroconvulsive Shock Therapy: No Prior Suicide Attempts: No  Medical History; Patient has history of HSV,  hypertension, GERD, hemorrhoids and surgical history of tonsillectomy and cholecystectomy.  She has obesity, fibromyalgia, hypothyroidism and vitamin D deficiency.  Her primary care physician is Dr. Idell Pickles. Patient denies any history of seizures.  Education and Work History; Patient has college education.  She is working as a Agricultural engineer for more than 8 years.  Review of Systems  Musculoskeletal: Negative.   Skin: Negative for itching and rash.  Neurological: Negative for dizziness, tremors and headaches.  Psychiatric/Behavioral: Positive for depression. The patient is nervous/anxious. The patient does not have insomnia.     Psychiatric: Agitation: No Hallucination: No Depressed Mood: No Insomnia: No Hypersomnia: No Altered Concentration: No Feels Worthless: No Grandiose Ideas: No Belief In Special Powers: No New/Increased Substance Abuse: No Compulsions: No  Neurologic: Headache: No Seizure: No Paresthesias: No   Musculoskeletal: Strength & Muscle Tone: within normal limits Gait & Station: normal Patient leans: N/A  Outpatient Encounter Prescriptions as of 11/26/2015  Medication Sig Note  . ALPRAZolam (XANAX) 1 MG tablet TAKE 1 TABLET BY MOUTH TWICE A DAY AS NEEDED   . Cholecalciferol (VITAMIN D) 2000 UNITS CAPS Take 8,000 Units by mouth daily.    . clobetasol (TEMOVATE) 0.05 % external solution Apply 1 application topically 2 (two) times daily.   Marland Kitchen FLUoxetine (PROZAC) 40 MG capsule TAKE 1 CAPSULE (40 MG TOTAL) BY MOUTH DAILY.   Marland Kitchen ibuprofen (ADVIL,MOTRIN) 200 MG tablet Take 200 mg by mouth every 6 (six) hours as needed for pain. Takes 4 tablets (800 mg) every 4-6  hrs prn   . lamoTRIgine (LAMICTAL) 100 MG tablet Take 1 tablet (100 mg total) by mouth daily.   Marland Kitchen levofloxacin (LEVAQUIN) 500 MG tablet Take 500 mg by mouth daily. 09/25/2015: Received from: External Pharmacy  . magnesium 30 MG tablet Take 250 mg by mouth 1 day or 1 dose.   . metroNIDAZOLE (METROGEL)  0.75 % vaginal gel PLACE 1 APPLICATORFUL VAGINALLY AT BEDTIME. FOR 5 DAYS 09/25/2015: Received from: External Pharmacy  . polyethylene glycol (MIRALAX / GLYCOLAX) packet Take 17 g by mouth daily.   . pravastatin (PRAVACHOL) 40 MG tablet TAKE 1 TABLET BY MOUTH DAILY   . ranitidine (ZANTAC) 300 MG tablet Take 1 tablet (300 mg total) by mouth at bedtime.   Marland Kitchen thyroid (ARMOUR THYROID) 90 MG tablet Take 1 tablet (90 mg total) by mouth daily.   . verapamil (CALAN-SR) 120 MG CR tablet Take 1 tablet (120 mg total) by mouth at bedtime.   . [DISCONTINUED] FLUoxetine (PROZAC) 40 MG capsule TAKE 1 CAPSULE (40 MG TOTAL) BY MOUTH DAILY.   . [DISCONTINUED] lamoTRIgine (LAMICTAL) 100 MG tablet Take 1 tablet (100 mg total) by mouth daily.    No facility-administered encounter medications on file as of 11/26/2015.     Recent Results (from the past 2160 hour(s))  CBC with Differential/Platelet     Status: None   Collection Time: 09/09/15  4:43 PM  Result Value Ref Range   WBC 8.7 4.0 - 10.5 K/uL   RBC 4.56 3.87 - 5.11 MIL/uL   Hemoglobin 13.3 12.0 - 15.0 g/dL   HCT 40.3 36.0 - 46.0 %   MCV 88.4 78.0 - 100.0 fL   MCH 29.2 26.0 - 34.0 pg   MCHC 33.0 30.0 - 36.0 g/dL   RDW 13.4 11.5 - 15.5 %   Platelets 214 150 - 400 K/uL   MPV 11.0 8.6 - 12.4 fL   Neutrophils Relative % 65 43 - 77 %   Neutro Abs 5.7 1.7 - 7.7 K/uL   Lymphocytes Relative 28 12 - 46 %   Lymphs Abs 2.4 0.7 - 4.0 K/uL   Monocytes Relative 6 3 - 12 %   Monocytes Absolute 0.5 0.1 - 1.0 K/uL   Eosinophils Relative 1 0 - 5 %   Eosinophils Absolute 0.1 0.0 - 0.7 K/uL   Basophils Relative 0 0 - 1 %   Basophils Absolute 0.0 0.0 - 0.1 K/uL   Smear Review Criteria for review not met   BASIC METABOLIC PANEL WITH GFR     Status: None   Collection Time: 09/09/15  4:43 PM  Result Value Ref Range   Sodium 138 135 - 146 mmol/L   Potassium 4.1 3.5 - 5.3 mmol/L   Chloride 100 98 - 110 mmol/L   CO2 29 20 - 31 mmol/L   Glucose, Bld 85 65 - 99 mg/dL    BUN 15 7 - 25 mg/dL   Creat 0.89 0.50 - 1.10 mg/dL   Calcium 9.4 8.6 - 10.2 mg/dL   GFR, Est African American >89 >=60 mL/min   GFR, Est Non African American 84 >=60 mL/min    Comment:   The estimated GFR is a calculation valid for adults (>=62 years old) that uses the CKD-EPI algorithm to adjust for age and sex. It is   not to be used for children, pregnant women, hospitalized patients,    patients on dialysis, or with rapidly changing kidney function. According to the NKDEP, eGFR >89 is normal, 60-89 shows mild  impairment, 30-59 shows moderate impairment, 15-29 shows severe impairment and <15 is ESRD.     Hepatic function panel     Status: None   Collection Time: 09/09/15  4:43 PM  Result Value Ref Range   Total Bilirubin 0.5 0.2 - 1.2 mg/dL   Bilirubin, Direct 0.1 <=0.2 mg/dL   Indirect Bilirubin 0.4 0.2 - 1.2 mg/dL   Alkaline Phosphatase 51 33 - 115 U/L   AST 15 10 - 30 U/L   ALT 18 6 - 29 U/L   Total Protein 6.9 6.1 - 8.1 g/dL   Albumin 4.3 3.6 - 5.1 g/dL  TSH     Status: None   Collection Time: 09/09/15  4:43 PM  Result Value Ref Range   TSH 0.491 0.350 - 4.500 uIU/mL  Lipid panel     Status: Abnormal   Collection Time: 09/09/15  4:43 PM  Result Value Ref Range   Cholesterol 165 125 - 200 mg/dL   Triglycerides 156 (H) <150 mg/dL   HDL 47 >=46 mg/dL   Total CHOL/HDL Ratio 3.5 <=5.0 Ratio   VLDL 31 (H) <30 mg/dL   LDL Cholesterol 87 <130 mg/dL    Comment:   Total Cholesterol/HDL Ratio:CHD Risk                        Coronary Heart Disease Risk Table                                        Men       Women          1/2 Average Risk              3.4        3.3              Average Risk              5.0        4.4           2X Average Risk              9.6        7.1           3X Average Risk             23.4       11.0 Use the calculated Patient Ratio above and the CHD Risk table  to determine the patient's CHD Risk.   Hemoglobin A1c     Status: None   Collection  Time: 09/09/15  4:43 PM  Result Value Ref Range   Hgb A1c MFr Bld 5.6 <5.7 %    Comment:                                                                        According to the ADA Clinical Practice Recommendations for 2011, when HbA1c is used as a screening test:     >=6.5%   Diagnostic of Diabetes Mellitus            (if abnormal result is confirmed)   5.7-6.4%   Increased  risk of developing Diabetes Mellitus   References:Diagnosis and Classification of Diabetes Mellitus,Diabetes TDVV,6160,73(XTGGY 1):S62-S69 and Standards of Medical Care in         Diabetes - 2011,Diabetes IRSW,5462,70 (Suppl 1):S11-S61.      Mean Plasma Glucose 114 <117 mg/dL  Magnesium     Status: None   Collection Time: 09/09/15  4:43 PM  Result Value Ref Range   Magnesium 2.0 1.5 - 2.5 mg/dL  Vit D  25 hydroxy (rtn osteoporosis monitoring)     Status: None   Collection Time: 09/09/15  4:43 PM  Result Value Ref Range   Vit D, 25-Hydroxy 55 30 - 100 ng/mL    Comment: Vitamin D Status           25-OH Vitamin D        Deficiency                <20 ng/mL        Insufficiency         20 - 29 ng/mL        Optimal             > or = 30 ng/mL   For 25-OH Vitamin D testing on patients on D2-supplementation and patients for whom quantitation of D2 and D3 fractions is required, the QuestAssureD 25-OH VIT D, (D2,D3), LC/MS/MS is recommended: order code [phone] (patients > 2 yrs).   Lamotrigine level     Status: None   Collection Time: 09/09/15  4:43 PM  Result Value Ref Range   Lamotrigine Lvl 4.2 4.0 - 18.0 mcg/mL  CBC with Differential/Platelet     Status: None   Collection Time: 10/07/15  2:54 PM  Result Value Ref Range   WBC 9.4 4.0 - 10.5 K/uL   RBC 4.17 3.87 - 5.11 MIL/uL   Hemoglobin 12.3 12.0 - 15.0 g/dL   HCT 36.4 36.0 - 46.0 %   MCV 87.3 78.0 - 100.0 fL   MCH 29.5 26.0 - 34.0 pg   MCHC 33.8 30.0 - 36.0 g/dL   RDW 13.5 11.5 - 15.5 %   Platelets 193 150 - 400 K/uL   MPV 10.4 8.6 - 12.4 fL    Neutrophils Relative % 68 43 - 77 %   Neutro Abs 6.4 1.7 - 7.7 K/uL   Lymphocytes Relative 25 12 - 46 %   Lymphs Abs 2.4 0.7 - 4.0 K/uL   Monocytes Relative 6 3 - 12 %   Monocytes Absolute 0.6 0.1 - 1.0 K/uL   Eosinophils Relative 1 0 - 5 %   Eosinophils Absolute 0.1 0.0 - 0.7 K/uL   Basophils Relative 0 0 - 1 %   Basophils Absolute 0.0 0.0 - 0.1 K/uL   Smear Review Criteria for review not met   TSH     Status: Abnormal   Collection Time: 10/07/15  2:54 PM  Result Value Ref Range   TSH 0.310 (L) 0.350 - 4.500 uIU/mL  Comprehensive metabolic panel     Status: Abnormal   Collection Time: 10/07/15  2:54 PM  Result Value Ref Range   Sodium 135 135 - 146 mmol/L   Potassium 4.1 3.5 - 5.3 mmol/L   Chloride 101 98 - 110 mmol/L   CO2 28 20 - 31 mmol/L   Glucose, Bld 103 (H) 65 - 99 mg/dL   BUN 14 7 - 25 mg/dL   Creat 0.63 0.50 - 1.10 mg/dL   Total Bilirubin 0.4 0.2 - 1.2 mg/dL  Alkaline Phosphatase 49 33 - 115 U/L   AST 16 10 - 30 U/L   ALT 19 6 - 29 U/L   Total Protein 6.5 6.1 - 8.1 g/dL   Albumin 4.2 3.6 - 5.1 g/dL   Calcium 9.1 8.6 - 10.2 mg/dL      Constitutional:  BP 128/79 mmHg  Pulse 77  Ht _0  (1.549 m)  Wt 178 lb (80.74 kg)  BMI 33.65 kg/m2   Mental Status Examination;  Patient is well groomed well dressed female who appears to be in her stated age.  She is pleasant and cooperative.  She maintained good eye contact.  Her speech is soft, clear, coherent with normal tone and volume.  She described her mood euthymic and her affect is appropriate.  Her thought process slow but logical and goal-directed.  There were no flight of ideas or any loose association.  Her psychomotor activity is slow.  Her fund of knowledge is good.  She denies any auditory or visual hallucination.  She denies any active or passive suicidal thoughts or homicidal thought.  There were no delusions, paranoia or any obsessive thoughts.  Her cognition is good.  She's alert and oriented 3.  There were  no tremors or shakes.  Her insight judgment and impulse control is okay.   Established Problem, Stable/Improving (1), Review of Psycho-Social Stressors (1), Review of Last Therapy Session (1) and Review of Medication Regimen & Side Effects (2)  Assessment: Axis I: Major depressive disorder, recurrent  Axis II: Deferred  Axis III:  Past Medical History  Diagnosis Date  . Depression   . Hypothyroidism   . HSV-1 infection   . Fibromyalgia   . Preeclampsia   . Hypertension   . Anxiety   . Hemorrhoids   . Constipation   . GERD (gastroesophageal reflux disease)   . Other abnormal glucose   . Anemia      Plan:  Patient is a stable on current dose of Lamictal and Prozac.  She has no rash itching or any side effects.  Continue Lamictal 100 mg daily and Prozac 40 mg daily. Recommended to continue therapy with Lovena Le for counseling.  Discuss safety plan that anytime having active suicidal thoughts or homicidal thought and she need to call 911 or go to the local emergency room.  Patient is getting Xanax 1 mg half tablet from primary care physician.  Follow-up in 3 months.   ARFEEN,SYED T., MD 11/26/2015

## 2015-12-14 ENCOUNTER — Encounter: Payer: Self-pay | Admitting: Physician Assistant

## 2015-12-14 ENCOUNTER — Ambulatory Visit (INDEPENDENT_AMBULATORY_CARE_PROVIDER_SITE_OTHER): Payer: BC Managed Care – PPO | Admitting: Physician Assistant

## 2015-12-14 VITALS — BP 140/80 | HR 92 | Temp 97.9°F | Resp 16 | Ht 61.0 in | Wt 177.0 lb

## 2015-12-14 DIAGNOSIS — K2100 Gastro-esophageal reflux disease with esophagitis, without bleeding: Secondary | ICD-10-CM

## 2015-12-14 DIAGNOSIS — J029 Acute pharyngitis, unspecified: Secondary | ICD-10-CM | POA: Diagnosis not present

## 2015-12-14 DIAGNOSIS — E785 Hyperlipidemia, unspecified: Secondary | ICD-10-CM | POA: Diagnosis not present

## 2015-12-14 DIAGNOSIS — Z79899 Other long term (current) drug therapy: Secondary | ICD-10-CM | POA: Diagnosis not present

## 2015-12-14 DIAGNOSIS — E039 Hypothyroidism, unspecified: Secondary | ICD-10-CM

## 2015-12-14 DIAGNOSIS — E669 Obesity, unspecified: Secondary | ICD-10-CM | POA: Diagnosis not present

## 2015-12-14 DIAGNOSIS — R7303 Prediabetes: Secondary | ICD-10-CM | POA: Diagnosis not present

## 2015-12-14 DIAGNOSIS — E559 Vitamin D deficiency, unspecified: Secondary | ICD-10-CM | POA: Diagnosis not present

## 2015-12-14 DIAGNOSIS — R1011 Right upper quadrant pain: Secondary | ICD-10-CM

## 2015-12-14 DIAGNOSIS — I10 Essential (primary) hypertension: Secondary | ICD-10-CM

## 2015-12-14 LAB — CBC WITH DIFFERENTIAL/PLATELET
BASOS PCT: 0 % (ref 0–1)
Basophils Absolute: 0 10*3/uL (ref 0.0–0.1)
EOS ABS: 0.1 10*3/uL (ref 0.0–0.7)
EOS PCT: 1 % (ref 0–5)
HCT: 36 % (ref 36.0–46.0)
Hemoglobin: 11.8 g/dL — ABNORMAL LOW (ref 12.0–15.0)
Lymphocytes Relative: 40 % (ref 12–46)
Lymphs Abs: 2.6 10*3/uL (ref 0.7–4.0)
MCH: 29 pg (ref 26.0–34.0)
MCHC: 32.8 g/dL (ref 30.0–36.0)
MCV: 88.5 fL (ref 78.0–100.0)
MONO ABS: 0.5 10*3/uL (ref 0.1–1.0)
MONOS PCT: 8 % (ref 3–12)
MPV: 10.9 fL (ref 8.6–12.4)
Neutro Abs: 3.3 10*3/uL (ref 1.7–7.7)
Neutrophils Relative %: 51 % (ref 43–77)
PLATELETS: 210 10*3/uL (ref 150–400)
RBC: 4.07 MIL/uL (ref 3.87–5.11)
RDW: 13.3 % (ref 11.5–15.5)
WBC: 6.4 10*3/uL (ref 4.0–10.5)

## 2015-12-14 MED ORDER — FLUCONAZOLE 150 MG PO TABS: 150.0000 mg | ORAL_TABLET | Freq: Every day | ORAL | Status: DC

## 2015-12-14 MED ORDER — AZITHROMYCIN 250 MG PO TABS: ORAL_TABLET | ORAL | Status: AC

## 2015-12-14 MED ORDER — ESOMEPRAZOLE MAGNESIUM 40 MG PO CPDR: 40.0000 mg | DELAYED_RELEASE_CAPSULE | Freq: Every day | ORAL | Status: DC

## 2015-12-14 MED ORDER — NYSTATIN 100000 UNIT/ML MT SUSP: 5.0000 mL | Freq: Four times a day (QID) | OROMUCOSAL | Status: DC

## 2015-12-14 NOTE — Progress Notes (Signed)
Assessment and Plan:  1. Hypertension -Continue medication, monitor blood pressure at home. Continue DASH diet.  Reminder to go to the ER if any CP, SOB, nausea, dizziness, severe HA, changes vision/speech, left arm numbness and tingling and jaw pain.  2. Cholesterol -Continue diet and exercise. Check cholesterol.   3. Prediabetes  -Continue diet and exercise. Check A1C  4. Vitamin D Def - check level and continue medications.   5. RUQ pain With nausea, get AB [place], will treat with samples of nexium  6. Hypothyroidism -check TSH level, continue medications the same, reminded to take on an empty stomach 30-29mins before food. Thyroid is at goal at this time, not helping with fatigue but patient would like a second opinion, will send to endocrine  7. Sore throat Will treat with zpak, nystatin mouth wash, diflucan   Continue diet and meds as discussed. Further disposition pending results of labs. Over 30 minutes of exam, counseling, chart review, and critical decision making was performed  HPI 35 y.o. female  presents for 3 month follow up on hypertension, cholesterol, prediabetes, and vitamin D deficiency.   Her blood pressure has been controlled at home, today their BP is BP: 140/80 mmHg  She does not workout. She denies chest pain, shortness of breath, dizziness.  She is on cholesterol medication and denies myalgias. Her cholesterol is at goal. The cholesterol last visit was:   Lab Results  Component Value Date   CHOL 165 09/09/2015   HDL 47 09/09/2015   LDLCALC 87 09/09/2015   LDLDIRECT 114.9 04/30/2012   TRIG 156* 09/09/2015   CHOLHDL 3.5 09/09/2015    She has been working on diet and exercise for prediabetes, and denies paresthesia of the feet, polydipsia, polyuria and visual disturbances. Last A1C in the office was:  Lab Results  Component Value Date   HGBA1C 5.6 09/09/2015   Patient is on Vitamin D supplement.   Lab Results  Component Value Date   VD25OH 55  09/09/2015     She is on thyroid medication. Her medication was not changed last visit Lab Results  Component Value Date   TSH 0.310* 10/07/2015  .  She has depression and is following with psych, Dr. Lolly Mustache, on prozac 40, lamictal 100 to  about 6 weeks ago, and xanax PRN. Follows with [facility] for counseling. She has been having right sided flank pain, she has been having chest pain at night, has to sit up in bed, burps, and has nausea, denies SOB, states tums does not help.  She has had sore throat x 4 days, treated with ABX in Dec for strep.  She complains of continuing fatigue, feels worse than usual.  BMI is Body mass index is 33.46 kg/(m^2)., she is working on diet and exercise. Wt Readings from Last 3 Encounters:  12/14/15 177 lb (80.287 kg)  11/26/15 178 lb (80.74 kg)  10/07/15 181 lb (82.101 kg)      Current Medications:  Current Outpatient Prescriptions on File Prior to Visit  Medication Sig Dispense Refill  . ALPRAZolam (XANAX) 1 MG tablet TAKE 1 TABLET BY MOUTH TWICE A DAY AS NEEDED 60 tablet 0  . Cholecalciferol (VITAMIN D) 2000 UNITS CAPS Take 8,000 Units by mouth daily.     . clobetasol (TEMOVATE) 0.05 % external solution Apply 1 application topically 2 (two) times daily. 50 mL 0  . FLUoxetine (PROZAC) 40 MG capsule TAKE 1 CAPSULE (40 MG TOTAL) BY MOUTH DAILY. 30 capsule 2  . ibuprofen (ADVIL,MOTRIN)  200 MG tablet Take 200 mg by mouth every 6 (six) hours as needed for pain. Takes 4 tablets (800 mg) every 4-6 hrs prn    . lamoTRIgine (LAMICTAL) 100 MG tablet Take 1 tablet (100 mg total) by mouth daily. 30 tablet 2  . levofloxacin (LEVAQUIN) 500 MG tablet Take 500 mg by mouth daily.  0  . magnesium 30 MG tablet Take 250 mg by mouth 1 day or 1 dose.    . metroNIDAZOLE (METROGEL) 0.75 % vaginal gel PLACE 1 APPLICATORFUL VAGINALLY AT BEDTIME. FOR 5 DAYS  2  . polyethylene glycol (MIRALAX / GLYCOLAX) packet Take 17 g by mouth daily.    . pravastatin (PRAVACHOL) 40  MG tablet TAKE 1 TABLET BY MOUTH DAILY 90 tablet 1  . ranitidine (ZANTAC) 300 MG tablet Take 1 tablet (300 mg total) by mouth at bedtime. 30 tablet 1  . thyroid (ARMOUR THYROID) 90 MG tablet Take 1 tablet (90 mg total) by mouth daily. 30 tablet 2  . verapamil (CALAN-SR) 120 MG CR tablet Take 1 tablet (120 mg total) by mouth at bedtime. 30 tablet 11   No current facility-administered medications on file prior to visit.   Medical History:  Past Medical History  Diagnosis Date  . Depression   . Hypothyroidism   . HSV-1 infection   . Fibromyalgia   . Preeclampsia   . Hypertension   . Anxiety   . Hemorrhoids   . Constipation   . GERD (gastroesophageal reflux disease)   . Other abnormal glucose   . Anemia    Allergies:  Allergies  Allergen Reactions  . Doxycycline Other (See Comments)    GI UPSET  . Effexor [Venlafaxine] Other (See Comments)    DYSPHORIA  . Prednisone Other (See Comments)    FLUSHED  . Savella [Milnacipran Hcl] Other (See Comments)    NO RELIEF     Review of Systems:  Review of Systems  Constitutional: Positive for malaise/fatigue. Negative for fever, chills, weight loss and diaphoresis.  HENT: Positive for sore throat. Negative for congestion.   Eyes: Negative.   Respiratory: Negative.  Negative for cough, hemoptysis, sputum production, shortness of breath and wheezing.   Cardiovascular: Negative.  Negative for chest pain and claudication.  Gastrointestinal: Positive for heartburn, nausea, abdominal pain and constipation. Negative for vomiting, diarrhea, blood in stool and melena.  Genitourinary: Negative.   Musculoskeletal: Positive for myalgias. Negative for back pain, joint pain, falls and neck pain.  Skin: Negative for itching and rash.  Neurological: Negative.  Negative for weakness and headaches.  Psychiatric/Behavioral: Positive for depression. Negative for suicidal ideas, hallucinations, memory loss and substance abuse. The patient has insomnia. The  patient is not nervous/anxious.     Family history- Review and unchanged Social history- Review and unchanged Physical Exam: BP 140/80 mmHg  Pulse 92  Temp(Src) 97.9 F (36.6 C) (Temporal)  Resp 16  Ht  (1.549 m)  Wt 177 lb (80.287 kg)  BMI 33.46 kg/m2  SpO2 99%  LMP 12/11/2015 Wt Readings from Last 3 Encounters:  12/14/15 177 lb (80.287 kg)  11/26/15 178 lb (80.74 kg)  10/07/15 181 lb (82.101 kg)   General Appearance: Well nourished, in no apparent distress. Eyes: PERRLA, EOMs, conjunctiva no swelling or erythema Sinuses: No Frontal/maxillary tenderness ENT/Mouth: Ext aud canals clear, TMs without erythema, bulging. + erythema, mild swelling, without exudate on post pharynx.  Tonsils not swollen or erythematous.  Neck: Supple, thyroid normal.  Respiratory: Respiratory effort normal, BS equal  bilaterally without rales, rhonchi, wheezing or stridor.  Cardio: RRR with no MRGs. Brisk peripheral pulses without edema.  Abdomen: Soft, + BS,  + RUQ tenderness, no guarding, rebound, hernias, masses. Lymphatics: Non tender without lymphadenopathy.  Musculoskeletal: Full ROM, 5/5 strength, Normal gait Skin:  Warm, dry without rashes, lesions, ecchymosis.  Neuro: Cranial nerves intact. Normal muscle tone, no cerebellar symptoms. Psych: Awake and oriented X 3, normal affect, Insight and Judgment appropriate.    Quentin Mulling, PA-C 4:08 PM [facility]

## 2015-12-14 NOTE — Patient Instructions (Signed)
Food Choices for Gastroesophageal Reflux Disease, Adult When you have gastroesophageal reflux disease (GERD), the foods you eat and your eating habits are very important. Choosing the right foods can help ease the discomfort of GERD. WHAT GENERAL GUIDELINES DO I NEED TO FOLLOW?  Choose fruits, vegetables, whole grains, low-fat dairy products, and low-fat meat, fish, and poultry.  Limit fats such as oils, salad dressings, butter, nuts, and avocado.  Keep a food diary to identify foods that cause symptoms.  Avoid foods that cause reflux. These may be different for different people.  Eat frequent small meals instead of three large meals each day.  Eat your meals slowly, in a relaxed setting.  Limit fried foods.  Cook foods using methods other than frying.  Avoid drinking alcohol.  Avoid drinking large amounts of liquids with your meals.  Avoid bending over or lying down until 2-3 hours after eating. WHAT FOODS ARE NOT RECOMMENDED? The following are some foods and drinks that may worsen your symptoms: Vegetables Tomatoes. Tomato juice. Tomato and spaghetti sauce. Chili peppers. Onion and garlic. Horseradish. Fruits Oranges, grapefruit, and lemon (fruit and juice). Meats High-fat meats, fish, and poultry. This includes hot dogs, ribs, ham, sausage, salami, and bacon. Dairy Whole milk and chocolate milk. Sour cream. Cream. Butter. Ice cream. Cream cheese.  Beverages Coffee and tea, with or without caffeine. Carbonated beverages or energy drinks. Condiments Hot sauce. Barbecue sauce.  Sweets/Desserts Chocolate and cocoa. Donuts. Peppermint and spearmint. Fats and Oils High-fat foods, including French fries and potato chips. Other Vinegar. Strong spices, such as black pepper, white pepper, red pepper, cayenne, curry powder, cloves, ginger, and chili powder. The items listed above may not be a complete list of foods and beverages to avoid. Contact your dietitian for more  information.   This information is not intended to replace advice given to you by your health care provider. Make sure you discuss any questions you have with your health care provider.   Document Released: 11/07/2005 Document Revised: 11/28/2014 Document Reviewed: 09/11/2013 Elsevier Interactive Patient Education 2016 [employer]  

## 2015-12-15 ENCOUNTER — Ambulatory Visit: Payer: Self-pay | Admitting: Physician Assistant

## 2015-12-15 LAB — HEPATIC FUNCTION PANEL
ALK PHOS: 56 U/L (ref 33–115)
ALT: 19 U/L (ref 6–29)
AST: 17 U/L (ref 10–30)
Albumin: 4.3 g/dL (ref 3.6–5.1)
BILIRUBIN DIRECT: 0.1 mg/dL (ref <=0.2)
BILIRUBIN INDIRECT: 0.3 mg/dL (ref 0.2–1.2)
BILIRUBIN TOTAL: 0.4 mg/dL (ref 0.2–1.2)
Total Protein: 7 g/dL (ref 6.1–8.1)

## 2015-12-15 LAB — LIPID PANEL
CHOL/HDL RATIO: 4 ratio (ref <=5.0)
Cholesterol: 159 mg/dL (ref 125–200)
HDL: 40 mg/dL — AB (ref >=46)
LDL CALC: 82 mg/dL (ref <130)
Triglycerides: 185 mg/dL — ABNORMAL HIGH (ref <150)
VLDL: 37 mg/dL — AB (ref <30)

## 2015-12-15 LAB — BASIC METABOLIC PANEL WITH GFR
BUN: 8 mg/dL (ref 7–25)
CHLORIDE: 105 mmol/L (ref 98–110)
CO2: 24 mmol/L (ref 20–31)
Calcium: 8.9 mg/dL (ref 8.6–10.2)
Creat: 0.89 mg/dL (ref 0.50–1.10)
GFR, EST NON AFRICAN AMERICAN: 84 mL/min (ref >=60)
GFR, Est African American: >89 mL/min (ref >=60)
Glucose, Bld: 82 mg/dL (ref 65–99)
POTASSIUM: 4.1 mmol/L (ref 3.5–5.3)
SODIUM: 139 mmol/L (ref 135–146)

## 2015-12-15 LAB — VITAMIN D 25 HYDROXY (VIT D DEFICIENCY, FRACTURES): Vit D, 25-Hydroxy: 57 ng/mL (ref 30–100)

## 2015-12-15 LAB — TSH: TSH: 0.84 u[IU]/mL (ref 0.350–4.500)

## 2015-12-15 LAB — HEMOGLOBIN A1C
HEMOGLOBIN A1C: 5.6 % (ref <5.7)
Mean Plasma Glucose: 114 mg/dL (ref <117)

## 2015-12-15 LAB — MAGNESIUM: Magnesium: 2.2 mg/dL (ref 1.5–2.5)

## 2015-12-18 ENCOUNTER — Encounter: Payer: Self-pay | Admitting: Physician Assistant

## 2015-12-18 ENCOUNTER — Ambulatory Visit
Admission: RE | Admit: 2015-12-18 | Discharge: 2015-12-18 | Disposition: A | Discharge status: Home / Self Care | Payer: BC Managed Care – PPO | Source: Ambulatory Visit | Attending: Physician Assistant | Admitting: Physician Assistant

## 2015-12-18 DIAGNOSIS — R1011 Right upper quadrant pain: Secondary | ICD-10-CM

## 2015-12-29 ENCOUNTER — Encounter: Payer: Self-pay | Admitting: Physician Assistant

## 2015-12-31 ENCOUNTER — Encounter: Payer: Self-pay | Admitting: Internal Medicine

## 2015-12-31 ENCOUNTER — Ambulatory Visit (INDEPENDENT_AMBULATORY_CARE_PROVIDER_SITE_OTHER): Payer: BC Managed Care – PPO | Admitting: Internal Medicine

## 2015-12-31 VITALS — BP 102/70 | HR 80 | Temp 98.2°F | Resp 16 | Ht 61.0 in | Wt 181.0 lb

## 2015-12-31 DIAGNOSIS — R109 Unspecified abdominal pain: Secondary | ICD-10-CM | POA: Diagnosis not present

## 2015-12-31 MED ORDER — HYOSCYAMINE SULFATE 0.125 MG PO TABS: 0.1250 mg | ORAL_TABLET | Freq: Four times a day (QID) | ORAL | Status: DC | PRN

## 2015-12-31 MED ORDER — SIMETHICONE 80 MG PO CHEW: 80.0000 mg | CHEWABLE_TABLET | Freq: Four times a day (QID) | ORAL | Status: DC | PRN

## 2015-12-31 NOTE — Progress Notes (Signed)
Patient ID: Lauren [name], female   DOB: Sep 26, 1981, 35 y.o.   MRN: [id number]  HPI  Patient complains of AB pain. Onset was 1 month ago. Symptoms have been stable. The pain is described as aching and dull. Pain is located in the RUQ without radiation.  Aggravating factors: movement.  Alleviating factors: none.  She reports that she has tried some tums with some mild relief.  Associated symptoms: belching, flatus and nausea. The patient denies anorexia, arthralagias, chills, dysuria, frequency, headache and melena.   Review of Systems  Constitutional: Negative for fever, chills and malaise/fatigue.  Respiratory: Negative for cough, shortness of breath and wheezing.   Cardiovascular: Positive for chest pain.  Gastrointestinal: Positive for abdominal pain, diarrhea and constipation. Negative for heartburn, blood in stool and melena.  Genitourinary: Negative.     Physical exam:  Filed Vitals:   12/31/15 1604  BP: 102/70  Pulse: 80  Temp: 98.2 F (36.8 C)  Resp: 16    General:  Well developed well nourished, non-toxic appearing, NAD Head:  NCAT, PERLA, normal EOM, conjunctiva normal, ears clear bilaterally with normal TMs, Oropharynx clear and moist without exudate, no oropharyngeal erythema or swelling Neck:  No JVD, no cervical adenopathy, no thyromegaly Lungs:  Clear to auscultation A&P, no wheeze, rhonchi, rales.  Normal effort Heart:  RRR, no MRGs, normal peripheral pulses Abd:  +BS x 4, no distention, soft, non-tender, with no guarding, rigidity, or rebound.   GU:  No CVA tenderness bilaterally Skin:  No rash, warm and dry Neuro:  A&O x 3, CN II-XII intact, normal gait Psych:  Normal affect, no ideations, normal judgement and insight  Assessment and Plan:   1. Abdominal pain, unspecified abdominal location -questionable IBS -avoid fried foods as no gallbladder -stop benefiber - hyoscyamine (LEVSIN) 0.125 MG tablet; Take 1 tablet (0.125 mg total) by mouth every 6 (six)  hours as needed for cramping (diarrhea,nausea).  Dispense: 90 tablet; Refill: 0 - simethicone (GAS-X) 80 MG chewable tablet; Chew 1 tablet (80 mg total) by mouth every 6 (six) hours as needed for flatulence.  Dispense: 30 tablet; Refill: 0

## 2016-02-01 ENCOUNTER — Telehealth (HOSPITAL_COMMUNITY): Payer: Self-pay

## 2016-02-01 NOTE — Telephone Encounter (Signed)
Patients Lamictal was increased at her last appointment, she states that over the weekend she started to develop a rash and was really itchy. I advised patient to stop the medication for today and that I would call her back after speaking with you. Please review and advise, thank you

## 2016-02-02 NOTE — Telephone Encounter (Signed)
Stop the Lamictal.  Watch carefully if rash did not resolved than she need to be seen by her medical doctor as soon as possible.

## 2016-02-02 NOTE — Telephone Encounter (Signed)
I called patient and she had stopped the Lamictal yesterday. Patient states the rash is getting a little better and the itchiness comes and goes. I advised patient that if it did not clear up or got worse to contact her PCP, the patient voiced her understanding

## 2016-02-17 ENCOUNTER — Other Ambulatory Visit (HOSPITAL_COMMUNITY): Payer: Self-pay | Admitting: Psychiatry

## 2016-02-19 ENCOUNTER — Other Ambulatory Visit (HOSPITAL_COMMUNITY): Payer: Self-pay | Admitting: Psychiatry

## 2016-02-19 DIAGNOSIS — F331 Major depressive disorder, recurrent, moderate: Secondary | ICD-10-CM

## 2016-02-19 MED ORDER — FLUOXETINE HCL 40 MG PO CAPS: ORAL_CAPSULE | ORAL | Status: DC

## 2016-02-25 ENCOUNTER — Ambulatory Visit (HOSPITAL_COMMUNITY): Payer: Self-pay | Admitting: Psychiatry

## 2016-02-29 ENCOUNTER — Encounter (HOSPITAL_COMMUNITY): Payer: Self-pay | Admitting: Psychiatry

## 2016-02-29 ENCOUNTER — Ambulatory Visit (INDEPENDENT_AMBULATORY_CARE_PROVIDER_SITE_OTHER): Payer: BC Managed Care – PPO | Admitting: Psychiatry

## 2016-02-29 VITALS — BP 118/72 | HR 84 | Ht 61.0 in | Wt 178.2 lb

## 2016-02-29 DIAGNOSIS — F331 Major depressive disorder, recurrent, moderate: Secondary | ICD-10-CM | POA: Diagnosis not present

## 2016-02-29 MED ORDER — FLUOXETINE HCL 40 MG PO CAPS: ORAL_CAPSULE | ORAL | Status: DC

## 2016-02-29 MED ORDER — ARIPIPRAZOLE 2 MG PO TABS: 2.0000 mg | ORAL_TABLET | Freq: Every day | ORAL | Status: DC

## 2016-02-29 NOTE — Progress Notes (Signed)
[facility] [phone] Progress Note  Lauren [name] [id number] 35 y.o.  02/29/2016 2:37 PM  Chief Complaint:  I developed a rash with Lamictal.  I stop taking Lamictal and rash gone away.  I'm feeling very sad and depressed.  My irritability is coming back.          History of Present Illness:  Lauren [name] came for her follow-up appointment.  She is complaining of increased depression, irritability, mood swing since she is no longer and Lamictal.  She developed a rash and she was told to stop the Lamictal immediately.  She realizes that Lamictal was really helping her a lot.  She is feeling easily irritable, poor sleep, lack of motivation, isolation and more withdrawn.  She remember being on Lamictal she has more energy she started weight watcher but now she is more isolated and withdrawn. She admitted some time crying spells and feeling hopelessness but denies any suicidal thoughts or homicidal thought.  She has been off from school few days because she could not concentrate.  Her rash is completely resolved.   She like to  Try something else because she is concerned about her depression.  She is taking Prozac 40 mg.  She is not drinking or using any illegal substances.  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  She lives with her husband and 2 daughter.  Suicidal Ideation: No Plan Formed: No Patient has means to carry out plan: No  Homicidal Ideation: No Plan Formed: No Patient has means to carry out plan: No  Past Psychiatric History/Hospitalization(s) She remember taking antidepressant from primary care physiciansince 2002 when she was in college.  At that time she remembered feeling isolated, withdrawn, having crying spells and decreased energy.  She had tried Effexor and Prozac with good response by her primary care physician.  She also remembered taking Depakote, Zoloft, Celexa, Cymbalta, Wellbutrin and recently Brintellix. Patient was recently given Lamictal with good response until  she developed rash.Patient denies any history of abuse in the past.   Anxiety: Yes Bipolar Disorder: No Depression: Yes Mania: No Psychosis: No Schizophrenia: No Personality Disorder: No Hospitalization for psychiatric illness: No History of Electroconvulsive Shock Therapy: No Prior Suicide Attempts: No  Medical History; Patient has history of HSV, hypertension, GERD, hemorrhoids and surgical history of tonsillectomy and cholecystectomy.  She has obesity, fibromyalgia, hypothyroidism and vitamin D deficiency.  Her primary care physician is Dr. Idell Pickles. Patient denies any history of seizures.  fFmily History; Patient endorse mother has bipolar disorder.  Her half brother also has mental disorder.  Review of Systems  Musculoskeletal: Negative.   Skin: Negative for itching and rash.  Neurological: Negative for dizziness, tremors and headaches.  Psychiatric/Behavioral: Positive for depression. The patient is nervous/anxious and has insomnia.     Psychiatric: Agitation: No Hallucination: No Depressed Mood: No Insomnia: No Hypersomnia: No Altered Concentration: No Feels Worthless: No Grandiose Ideas: No Belief In Special Powers: No New/Increased Substance Abuse: No Compulsions: No  Neurologic: Headache: No Seizure: No Paresthesias: No   Musculoskeletal: Strength & Muscle Tone: within normal limits Gait & Station: normal Patient leans: N/A  Outpatient Encounter Prescriptions as of 02/29/2016  Medication Sig Note  . ALPRAZolam (XANAX) 1 MG tablet TAKE 1 TABLET BY MOUTH TWICE A DAY AS NEEDED   . Cholecalciferol (VITAMIN D) 2000 UNITS CAPS Take 8,000 Units by mouth daily.    . clobetasol (TEMOVATE) 0.05 % external solution Apply 1 application topically 2 (two) times daily.   Marland Kitchen esomeprazole (  NEXIUM) 40 MG capsule Take 1 capsule (40 mg total) by mouth daily.   Marland Kitchen FLUoxetine (PROZAC) 40 MG capsule TAKE 1 CAPSULE (40 MG TOTAL) BY MOUTH DAILY.   . hyoscyamine (LEVSIN) 0.125  MG tablet Take 1 tablet (0.125 mg total) by mouth every 6 (six) hours as needed for cramping (diarrhea,nausea).   Marland Kitchen ibuprofen (ADVIL,MOTRIN) 200 MG tablet Take 200 mg by mouth every 6 (six) hours as needed for pain. Takes 4 tablets (800 mg) every 4-6 hrs prn   . magnesium 30 MG tablet Take 250 mg by mouth 1 day or 1 dose.   . metroNIDAZOLE (METROGEL) 0.75 % vaginal gel PLACE 1 APPLICATORFUL VAGINALLY AT BEDTIME. FOR 5 DAYS 09/25/2015: Received from: External Pharmacy  . nystatin (MYCOSTATIN) 100000 UNIT/ML suspension Take 5 mLs (500,000 Units total) by mouth 4 (four) times daily. Retain in mouth as long as possible (Swish and Spit).   . polyethylene glycol (MIRALAX / GLYCOLAX) packet Take 17 g by mouth daily.   . pravastatin (PRAVACHOL) 40 MG tablet TAKE 1 TABLET BY MOUTH DAILY   . ranitidine (ZANTAC) 300 MG tablet Take 1 tablet (300 mg total) by mouth at bedtime.   . simethicone (GAS-X) 80 MG chewable tablet Chew 1 tablet (80 mg total) by mouth every 6 (six) hours as needed for flatulence.   . verapamil (CALAN-SR) 120 MG CR tablet Take 1 tablet (120 mg total) by mouth at bedtime.   . [DISCONTINUED] FLUoxetine (PROZAC) 40 MG capsule TAKE 1 CAPSULE (40 MG TOTAL) BY MOUTH DAILY.   . [DISCONTINUED] lamoTRIgine (LAMICTAL) 100 MG tablet TAKE 1 TABLET (100 MG TOTAL) BY MOUTH DAILY.   . [DISCONTINUED] thyroid (ARMOUR THYROID) 90 MG tablet Take 1 tablet (90 mg total) by mouth daily.   . ARIPiprazole ([name]) 2 MG tablet Take 1 tablet (2 mg total) by mouth daily.   Marland Kitchen levothyroxine (SYNTHROID, LEVOTHROID) 88 MCG tablet Take by mouth. 02/29/2016: Received from: [facility]   No facility-administered encounter medications on file as of 02/29/2016.     Recent Results (from the past 2160 hour(s))  CBC with Differential/Platelet     Status: Abnormal   Collection Time: 12/14/15  4:32 PM  Result Value Ref Range   WBC 6.4 4.0 - 10.5 K/uL   RBC 4.07 3.87 - 5.11 MIL/uL   Hemoglobin 11.8  (L) 12.0 - 15.0 g/dL   HCT 36.0 36.0 - 46.0 %   MCV 88.5 78.0 - 100.0 fL   MCH 29.0 26.0 - 34.0 pg   MCHC 32.8 30.0 - 36.0 g/dL   RDW 13.3 11.5 - 15.5 %   Platelets 210 150 - 400 K/uL   MPV 10.9 8.6 - 12.4 fL   Neutrophils Relative % 51 43 - 77 %   Neutro Abs 3.3 1.7 - 7.7 K/uL   Lymphocytes Relative 40 12 - 46 %   Lymphs Abs 2.6 0.7 - 4.0 K/uL   Monocytes Relative 8 3 - 12 %   Monocytes Absolute 0.5 0.1 - 1.0 K/uL   Eosinophils Relative 1 0 - 5 %   Eosinophils Absolute 0.1 0.0 - 0.7 K/uL   Basophils Relative 0 0 - 1 %   Basophils Absolute 0.0 0.0 - 0.1 K/uL   Smear Review Criteria for review not met   BASIC METABOLIC PANEL WITH GFR     Status: None   Collection Time: 12/14/15  4:32 PM  Result Value Ref Range   Sodium 139 135 - 146 mmol/L  Potassium 4.1 3.5 - 5.3 mmol/L   Chloride 105 98 - 110 mmol/L   CO2 24 20 - 31 mmol/L   Glucose, Bld 82 65 - 99 mg/dL   BUN 8 7 - 25 mg/dL   Creat 0.89 0.50 - 1.10 mg/dL   Calcium 8.9 8.6 - 10.2 mg/dL   GFR, Est African American >89 >=60 mL/min   GFR, Est Non African American 84 >=60 mL/min    Comment:   The estimated GFR is a calculation valid for adults (>=71 years old) that uses the CKD-EPI algorithm to adjust for age and sex. It is   not to be used for children, pregnant women, hospitalized patients,    patients on dialysis, or with rapidly changing kidney function. According to the NKDEP, eGFR >89 is normal, 60-89 shows mild impairment, 30-59 shows moderate impairment, 15-29 shows severe impairment and <15 is ESRD.     Hepatic function panel     Status: None   Collection Time: 12/14/15  4:32 PM  Result Value Ref Range   Total Bilirubin 0.4 0.2 - 1.2 mg/dL   Bilirubin, Direct 0.1 <=0.2 mg/dL   Indirect Bilirubin 0.3 0.2 - 1.2 mg/dL   Alkaline Phosphatase 56 33 - 115 U/L   AST 17 10 - 30 U/L   ALT 19 6 - 29 U/L   Total Protein 7.0 6.1 - 8.1 g/dL   Albumin 4.3 3.6 - 5.1 g/dL  TSH     Status: None   Collection Time: 12/14/15   4:32 PM  Result Value Ref Range   TSH 0.840 0.350 - 4.500 uIU/mL  Lipid panel     Status: Abnormal   Collection Time: 12/14/15  4:32 PM  Result Value Ref Range   Cholesterol 159 125 - 200 mg/dL   Triglycerides 185 (H) <150 mg/dL   HDL 40 (L) >=46 mg/dL   Total CHOL/HDL Ratio 4.0 <=5.0 Ratio   VLDL 37 (H) <30 mg/dL   LDL Cholesterol 82 <130 mg/dL    Comment:   Total Cholesterol/HDL Ratio:CHD Risk                        Coronary Heart Disease Risk Table                                        Men       Women          1/2 Average Risk              3.4        3.3              Average Risk              5.0        4.4           2X Average Risk              9.6        7.1           3X Average Risk             23.4       11.0 Use the calculated Patient Ratio above and the CHD Risk table  to determine the patient's CHD Risk.   Hemoglobin A1c     Status: None   Collection Time: 12/14/15  4:32 PM  Result Value Ref Range   Hgb A1c MFr Bld 5.6 <5.7 %    Comment:                                                                        According to the ADA Clinical Practice Recommendations for 2011, when HbA1c is used as a screening test:     >=6.5%   Diagnostic of Diabetes Mellitus            (if abnormal result is confirmed)   5.7-6.4%   Increased risk of developing Diabetes Mellitus   References:Diagnosis and Classification of Diabetes Mellitus,Diabetes VOPF,2924,46(KMMNO 1):S62-S69 and Standards of Medical Care in         Diabetes - 2011,Diabetes Care,2011,34 (Suppl 1):S11-S61.      Mean Plasma Glucose 114 <117 mg/dL  Magnesium     Status: None   Collection Time: 12/14/15  4:32 PM  Result Value Ref Range   Magnesium 2.2 1.5 - 2.5 mg/dL  VITAMIN D 25 Hydroxy (Vit-D Deficiency, Fractures)     Status: None   Collection Time: 12/14/15  4:32 PM  Result Value Ref Range   Vit D, 25-Hydroxy 57 30 - 100 ng/mL    Comment: Vitamin D Status           25-OH Vitamin D        Deficiency                 <20 ng/mL        Insufficiency         20 - 29 ng/mL        Optimal             > or = 30 ng/mL   For 25-OH Vitamin D testing on patients on D2-supplementation and patients for whom quantitation of D2 and D3 fractions is required, the QuestAssureD 25-OH VIT D, (D2,D3), LC/MS/MS is recommended: order code [phone] (patients > 2 yrs).       Constitutional:  BP 118/72 mmHg  Pulse 84  Ht 5' 1" (1.549 m)  Wt 178 lb 3.2 oz (80.831 kg)  BMI 33.69 kg/m2   Mental Status Examination;  Patient is well groomed well dressed female who appears to be in her stated age. She is cooperative and maintained fair eye contact.  She described her mood sad depressed and tired.  Her affect is constricted.  Her speech is soft, clear, coherent with normal tone and volume.  Her thought process slow but logical and goal-directed.  There were no flight of ideas or any loose association.  Her psychomotor activity is slow.  Her fund of knowledge is good.  She denies any auditory or visual hallucination.  She denies any active or passive suicidal thoughts or homicidal thought.  There were no delusions, paranoia or any obsessive thoughts.  Her cognition is good.  She's alert and oriented 3.  There were no tremors or shakes.  Her insight judgment and impulse control is okay.   Established Problem, Stable/Improving (1), Review of Psycho-Social Stressors (1), Decision to obtain old records (1), Review and summation of old records (2), Established Problem, Worsening (2), Review of Last Therapy Session (1), Review of Medication Regimen & Side  Effects (2) and Review of New Medication or Change in Dosage (2)  Assessment: Axis I: Major depressive disorder, recurrent  Axis II: Deferred  Axis III:  Past Medical History  Diagnosis Date  . Depression   . Hypothyroidism   . HSV-1 infection   . Fibromyalgia   . Preeclampsia   . Hypertension   . Anxiety   . Hemorrhoids   . Constipation   . GERD (gastroesophageal  reflux disease)   . Other abnormal glucose   . Anemia    Plan:  I review her psychosocial stressors, current medication.  I will discontinue Lamictal as patient developed a rash.  We will try low-dose [name] to help irritability, mood swing, and depression.  Discuss metabolic syndrome, DPS and sedation from [name]. Continue Prozac 40 mg daily. Recommended to continue therapy with Lovena Le for counseling.  Reinforce to take FMLA if unable to do the job. Discuss safety plan that anytime having active suicidal thoughts or homicidal thought and she need to call 911 or go to the local emergency room.  Patient is getting Xanax 1 mg half tablet from primary care physician.  Follow-up in 6 weeks.  , T., MD 02/29/2016

## 2016-03-02 ENCOUNTER — Encounter: Payer: Self-pay | Admitting: Internal Medicine

## 2016-03-02 ENCOUNTER — Ambulatory Visit (INDEPENDENT_AMBULATORY_CARE_PROVIDER_SITE_OTHER): Payer: BC Managed Care – PPO | Admitting: Internal Medicine

## 2016-03-02 VITALS — BP 108/70 | HR 72 | Temp 97.0°F | Resp 16 | Ht 61.5 in | Wt 176.8 lb

## 2016-03-02 DIAGNOSIS — E785 Hyperlipidemia, unspecified: Secondary | ICD-10-CM

## 2016-03-02 DIAGNOSIS — F32A Depression, unspecified: Secondary | ICD-10-CM

## 2016-03-02 DIAGNOSIS — E559 Vitamin D deficiency, unspecified: Secondary | ICD-10-CM | POA: Diagnosis not present

## 2016-03-02 DIAGNOSIS — F329 Major depressive disorder, single episode, unspecified: Secondary | ICD-10-CM

## 2016-03-02 DIAGNOSIS — Z136 Encounter for screening for cardiovascular disorders: Secondary | ICD-10-CM | POA: Diagnosis not present

## 2016-03-02 DIAGNOSIS — I10 Essential (primary) hypertension: Secondary | ICD-10-CM | POA: Diagnosis not present

## 2016-03-02 DIAGNOSIS — Z79899 Other long term (current) drug therapy: Secondary | ICD-10-CM

## 2016-03-02 DIAGNOSIS — R7303 Prediabetes: Secondary | ICD-10-CM

## 2016-03-02 DIAGNOSIS — Z130 Encounter for screening for diseases of the blood and blood-forming organs and certain disorders involving the immune mechanism: Secondary | ICD-10-CM

## 2016-03-02 DIAGNOSIS — Z0000 Encounter for general adult medical examination without abnormal findings: Secondary | ICD-10-CM | POA: Diagnosis not present

## 2016-03-02 DIAGNOSIS — E039 Hypothyroidism, unspecified: Secondary | ICD-10-CM

## 2016-03-02 LAB — CBC WITH DIFFERENTIAL/PLATELET
BASOS ABS: 0 {cells}/uL (ref 0–200)
Basophils Relative: 0 %
EOS PCT: 1 %
Eosinophils Absolute: 88 cells/uL (ref 15–500)
HCT: 38.7 % (ref 35.0–45.0)
Hemoglobin: 12.6 g/dL (ref 11.7–15.5)
Lymphocytes Relative: 25 %
Lymphs Abs: 2200 cells/uL (ref 850–3900)
MCH: 27.9 pg (ref 27.0–33.0)
MCHC: 32.6 g/dL (ref 32.0–36.0)
MCV: 85.6 fL (ref 80.0–100.0)
MONOS PCT: 7 %
MPV: 10.7 fL (ref 7.5–12.5)
Monocytes Absolute: 616 cells/uL (ref 200–950)
NEUTROS PCT: 67 %
Neutro Abs: 5896 cells/uL (ref 1500–7800)
PLATELETS: 193 10*3/uL (ref 140–400)
RBC: 4.52 MIL/uL (ref 3.80–5.10)
RDW: 13.7 % (ref 11.0–15.0)
WBC: 8.8 10*3/uL (ref 3.8–10.8)

## 2016-03-02 NOTE — Progress Notes (Signed)
Complete Physical  Assessment and Plan:   1. Routine general medical examination at a health care facility  - CBC with Differential/Platelet - BASIC METABOLIC PANEL WITH GFR - Hepatic function panel - Magnesium  2. Essential hypertension  - Urinalysis, Routine w reflex microscopic (not at [facility]) - Microalbumin / creatinine urine ratio - EKG 12-Lead - TSH  3. Hypothyroidism, unspecified hypothyroidism type -TSH -cont levothyroxine -possible source of chills?  4. Prediabetes  - Hemoglobin A1c - Insulin, random  5. Hyperlipidemia LDL goal <100  - Lipid panel  6. Depression -cont therapy -cont to see Dr. Lolly Mustache -cont 0.5 mg xanax prn  7. Medication management  - CBC with Differential/Platelet - BASIC METABOLIC PANEL WITH GFR - Hepatic function panel - Magnesium  8. Screening for deficiency anemia  - Iron and TIBC - Vitamin B12  9. Vitamin D deficiency -cont supplement - VITAMIN D 25 Hydroxy (Vit-D Deficiency, Fractures)    Discussed med's effects and SE's. Screening labs and tests as requested with regular follow-up as recommended.  HPI  35 y.o. female  presents for a complete physical.  Her blood pressure has been controlled at home, today their BP is BP: 108/70 mmHg.  She does workout. She denies chest pain, shortness of breath, dizziness.   She is on cholesterol medication and denies myalgias. Her cholesterol is at goal. The cholesterol last visit was:  Lab Results  Component Value Date   CHOL 159 12/14/2015   HDL 40* 12/14/2015   LDLCALC 82 12/14/2015   LDLDIRECT 114.9 04/30/2012   TRIG 185* 12/14/2015   CHOLHDL 4.0 12/14/2015  .  She has been working on diet and exercise for prediabetes, she is not on bASA, she is not on ACE/ARB and denies foot ulcerations, hyperglycemia, hypoglycemia , increased appetite, nausea, paresthesia of the feet, polydipsia, polyuria, visual disturbances, vomiting and weight loss. Last A1C in the office was:  Lab  Results  Component Value Date   HGBA1C 5.6 12/14/2015  She is currently at a higher risk for metabolic syndrome which Dr. Lolly Mustache would like for [place] to monitor for secondary to being on abilify. We will send most current labs over to him.  She is currently participating in [employer].  She feels like coming of the lamictal has given her a small setback but that she can continue to work on it.    Patient is on Vitamin D supplement.   Lab Results  Component Value Date   VD25OH 72 12/14/2015      Patient reports that she has been having some issues with her mood.  She reports that she has had to stop the lamictal secondary to a rash.  She is now taking low dose abilify from her psychiatrist.    She has been having issues with hot and cold chills.  She reports that she sometimes feels like she is having something stuck in her throat.  She feels like it is phlegm.  This seems to come and go.  She does not have trouble swallowing.  She is not having any sneezing.  She does have it more so in the morning and at nighttime.    She reports that she does tend to have some pain in the right side when she has her period.  She reports that her pap smear has been normal.    Current Medications:  Current Outpatient Prescriptions on File Prior to Visit  Medication Sig Dispense Refill  . ALPRAZolam (XANAX) 1 MG tablet TAKE 1 TABLET  BY MOUTH TWICE A DAY AS NEEDED 60 tablet 0  . ARIPiprazole ([name]) 2 MG tablet Take 1 tablet (2 mg total) by mouth daily. 30 tablet 1  . Cholecalciferol (VITAMIN D) 2000 UNITS CAPS Take 8,000 Units by mouth daily.     . clobetasol (TEMOVATE) 0.05 % external solution Apply 1 application topically 2 (two) times daily. 50 mL 0  . esomeprazole (NEXIUM) 40 MG capsule Take 1 capsule (40 mg total) by mouth daily. 30 capsule 0  . FLUoxetine (PROZAC) 40 MG capsule TAKE 1 CAPSULE (40 MG TOTAL) BY MOUTH DAILY. 30 capsule 1  . hyoscyamine (LEVSIN) 0.125 MG tablet Take 1 tablet (0.125 mg  total) by mouth every 6 (six) hours as needed for cramping (diarrhea,nausea). 90 tablet 0  . ibuprofen (ADVIL,MOTRIN) 200 MG tablet Take 200 mg by mouth every 6 (six) hours as needed for pain. Takes 4 tablets (800 mg) every 4-6 hrs prn    . levothyroxine (SYNTHROID, LEVOTHROID) 88 MCG tablet Take by mouth.    . magnesium 30 MG tablet Take 250 mg by mouth 1 day or 1 dose.    . metroNIDAZOLE (METROGEL) 0.75 % vaginal gel PLACE 1 APPLICATORFUL VAGINALLY AT BEDTIME. FOR 5 DAYS  2  . nystatin (MYCOSTATIN) 100000 UNIT/ML suspension Take 5 mLs (500,000 Units total) by mouth 4 (four) times daily. Retain in mouth as long as possible (Swish and Spit). 240 mL 0  . polyethylene glycol (MIRALAX / GLYCOLAX) packet Take 17 g by mouth daily.    . pravastatin (PRAVACHOL) 40 MG tablet TAKE 1 TABLET BY MOUTH DAILY 90 tablet 1  . ranitidine (ZANTAC) 300 MG tablet Take 1 tablet (300 mg total) by mouth at bedtime. 30 tablet 1  . simethicone (GAS-X) 80 MG chewable tablet Chew 1 tablet (80 mg total) by mouth every 6 (six) hours as needed for flatulence. 30 tablet 0  . verapamil (CALAN-SR) 120 MG CR tablet Take 1 tablet (120 mg total) by mouth at bedtime. 30 tablet 11   No current facility-administered medications on file prior to visit.    Health Maintenance:   Immunization History  Administered Date(s) Administered  . Influenza Split 09/09/2015  . Influenza-Unspecified 08/05/2013  . Td 11/21/1997  . Tdap 01/20/2011    Tetanus: 2012 Flu vaccine:2016 Pap: 2016  Colonoscopy: 2012 Last Dental Exam:  Twice yearly Last Eye Exam:  Dr. Lorin PicketScott, has not been in several years  Patient Care Team: Lucky CowboyWilliam McKeown, MD as PCP - General (Internal Medicine) Fredrich BirksJon Scott, OD as Referring Physician (Optometry) Sherian ReinJody Bovard-Stuckert, MD as Consulting Physician (Obstetrics and Gynecology) Dairl PonderMatthew Weingold, MD as Consulting Physician (Orthopedic Surgery) Elvina SidleNikhil Balakrishnan, MD (Neurology) Venancio PoissonLaura Lomax, MD as Consulting Physician  (Dermatology) Pollyann SavoyShaili Deveshwar, MD as Consulting Physician (Rheumatology) Cleotis NipperSyed T Arfeen, MD as Consulting Physician (Psychiatry)  Allergies:  Allergies  Allergen Reactions  . Lamictal [Lamotrigine] Rash  . Doxycycline Other (See Comments)    GI UPSET  . Effexor [Venlafaxine] Other (See Comments)    DYSPHORIA  . Prednisone Other (See Comments)    FLUSHED  . Savella [Milnacipran Hcl] Other (See Comments)    NO RELIEF    Medical History:  Past Medical History  Diagnosis Date  . Depression   . Hypothyroidism   . HSV-1 infection   . Fibromyalgia   . Preeclampsia   . Hypertension   . Anxiety   . Hemorrhoids   . Constipation   . GERD (gastroesophageal reflux disease)   . Other abnormal glucose   .  Anemia     Surgical History:  Past Surgical History  Procedure Laterality Date  . Cholecystectomy  2006  . Tonsillectomy    . Wisdom tooth extraction      Family History:  Family History  Problem Relation Age of Onset  . Heart disease Maternal Grandmother   . Diabetes Maternal Grandmother   . Cancer Maternal Grandmother     SKIN  . Hyperlipidemia Maternal Grandmother   . Hypertension Maternal Grandmother   . Hypothyroidism Maternal Grandmother   . Hypothyroidism Mother   . Cervical cancer Mother   . Bipolar disorder Mother   . Diabetes Mother   . Colon polyps Mother   . Bipolar disorder Brother   . Kidney disease Brother   . Alcohol abuse Brother   . Lung cancer Maternal Grandfather   . Heart disease Maternal Aunt   . Diabetes Maternal Aunt   . Diabetes Cousin   . Colon cancer Neg Hx   . Hypothyroidism Daughter     Social History:  Social History  Substance Use Topics  . Smoking status: Never Smoker   . Smokeless tobacco: Never Used  . Alcohol Use: No     Comment: occ    Review of Systems: Review of Systems  Constitutional: Positive for chills and malaise/fatigue. Negative for fever.  Respiratory: Negative for cough, shortness of breath and wheezing.    Cardiovascular: Negative for chest pain, palpitations and leg swelling.  Gastrointestinal: Positive for blood in stool. Negative for heartburn, abdominal pain, diarrhea, constipation and melena.  Genitourinary: Negative.   Skin: Negative.   Neurological: Negative for dizziness, sensory change and loss of consciousness.  Psychiatric/Behavioral: Positive for depression. The patient is nervous/anxious and has insomnia.     Physical Exam: Estimated body mass index is 32.87 kg/(m^2) as calculated from the following:   Height as of this encounter: 5' 1.5" (1.562 m).   Weight as of this encounter: 176 lb 12.8 oz (80.196 kg). BP 108/70 mmHg  Pulse 72  Temp(Src) 97 F (36.1 C) (Temporal)  Resp 16  Ht 5' 1.5" (1.562 m)  Wt 176 lb 12.8 oz (80.196 kg)  BMI 32.87 kg/m2  SpO2 99%  LMP 03/02/2016  General Appearance: Well nourished well developed, in no apparent distress.  Eyes: PERRLA, EOMs, conjunctiva no swelling or erythema ENT/Mouth: Ear canals normal without obstruction, swelling, erythema, or discharge.  TMs normal bilaterally with no erythema, bulging, retraction, or loss of landmark.  Oropharynx moist and clear with no exudate, erythema, or swelling. Neck: Supple, thyroid normal. No bruits.  No cervical adenopathy Respiratory: Respiratory effort normal, Breath sounds clear A&P without wheeze, rhonchi, rales.   Cardio: RRR without murmurs, rubs or gallops. Brisk peripheral pulses without edema.  Chest: symmetric, with normal excursions Abdomen: Soft, nontender, no guarding, rebound, hernias, masses, or organomegaly.  Lymphatics: Non tender without lymphadenopathy.   Musculoskeletal: Full ROM all peripheral extremities,5/5 strength, and normal gait.  Skin: Warm, dry without rashes, lesions, ecchymosis. Neuro: Awake and oriented X 3, Cranial nerves intact, reflexes equal bilaterally. Normal muscle tone, no cerebellar symptoms. Sensation intact.  Psych:  Flat and depressed affect, Insight  and Judgment appropriate.   EKG: WNL no changes.  Over 40 minutes of exam, counseling, chart review and critical decision making was performed  Lauren [name] 3:37 PM [facility]

## 2016-03-03 LAB — IRON AND TIBC
%SAT: 38 % (ref 11–50)
Iron: 131 ug/dL (ref 40–190)
TIBC: 347 ug/dL (ref 250–450)
UIBC: 216 ug/dL (ref 125–400)

## 2016-03-03 LAB — URINALYSIS, MICROSCOPIC ONLY
CASTS: NONE SEEN [LPF]
Crystals: NONE SEEN [HPF]
YEAST: NONE SEEN [HPF]

## 2016-03-03 LAB — URINALYSIS, ROUTINE W REFLEX MICROSCOPIC
BILIRUBIN URINE: NEGATIVE
Glucose, UA: NEGATIVE
Ketones, ur: NEGATIVE
LEUKOCYTES UA: NEGATIVE
NITRITE: NEGATIVE
PROTEIN: NEGATIVE
Specific Gravity, Urine: 1.017 (ref 1.001–1.035)
pH: 7 (ref 5.0–8.0)

## 2016-03-03 LAB — HEMOGLOBIN A1C
Hgb A1c MFr Bld: 5.5 % (ref <5.7)
MEAN PLASMA GLUCOSE: 111 mg/dL

## 2016-03-03 LAB — HEPATIC FUNCTION PANEL
ALT: 16 U/L (ref 6–29)
AST: 14 U/L (ref 10–30)
Albumin: 4.2 g/dL (ref 3.6–5.1)
Alkaline Phosphatase: 54 U/L (ref 33–115)
BILIRUBIN DIRECT: 0.1 mg/dL (ref <=0.2)
BILIRUBIN TOTAL: 0.3 mg/dL (ref 0.2–1.2)
Indirect Bilirubin: 0.2 mg/dL (ref 0.2–1.2)
Total Protein: 6.8 g/dL (ref 6.1–8.1)

## 2016-03-03 LAB — BASIC METABOLIC PANEL WITH GFR
BUN: 10 mg/dL (ref 7–25)
CALCIUM: 9.4 mg/dL (ref 8.6–10.2)
CO2: 28 mmol/L (ref 20–31)
CREATININE: 0.88 mg/dL (ref 0.50–1.10)
Chloride: 104 mmol/L (ref 98–110)
GFR, Est Non African American: 84 mL/min (ref >=60)
Glucose, Bld: 78 mg/dL (ref 65–99)
Potassium: 3.6 mmol/L (ref 3.5–5.3)
SODIUM: 140 mmol/L (ref 135–146)

## 2016-03-03 LAB — LIPID PANEL
CHOL/HDL RATIO: 4 ratio (ref <=5.0)
CHOLESTEROL: 149 mg/dL (ref 125–200)
HDL: 37 mg/dL — AB (ref >=46)
LDL Cholesterol: 48 mg/dL (ref <130)
TRIGLYCERIDES: 319 mg/dL — AB (ref <150)
VLDL: 64 mg/dL — ABNORMAL HIGH (ref <30)

## 2016-03-03 LAB — MICROALBUMIN / CREATININE URINE RATIO
CREATININE, URINE: 124 mg/dL (ref 20–320)
MICROALB/CREAT RATIO: 7 ug/mg{creat} (ref <30)
Microalb, Ur: 0.9 mg/dL

## 2016-03-03 LAB — VITAMIN B12: VITAMIN B 12: 826 pg/mL (ref 200–1100)

## 2016-03-03 LAB — MAGNESIUM: MAGNESIUM: 1.9 mg/dL (ref 1.5–2.5)

## 2016-03-03 LAB — VITAMIN D 25 HYDROXY (VIT D DEFICIENCY, FRACTURES): VIT D 25 HYDROXY: 56 ng/mL (ref 30–100)

## 2016-03-03 LAB — INSULIN, RANDOM: Insulin: 21.5 u[IU]/mL — ABNORMAL HIGH (ref 2.0–19.6)

## 2016-03-03 LAB — TSH: TSH: 0.78 mIU/L

## 2016-03-08 ENCOUNTER — Encounter: Payer: Self-pay | Admitting: Internal Medicine

## 2016-03-19 ENCOUNTER — Other Ambulatory Visit: Payer: Self-pay | Admitting: Physician Assistant

## 2016-03-21 NOTE — Telephone Encounter (Signed)
Rx called into [facility] pharmacy. 

## 2016-03-22 ENCOUNTER — Other Ambulatory Visit: Payer: Self-pay

## 2016-03-22 ENCOUNTER — Ambulatory Visit: Payer: Self-pay | Admitting: Physician Assistant

## 2016-03-26 ENCOUNTER — Other Ambulatory Visit: Payer: Self-pay | Admitting: Physician Assistant

## 2016-03-31 ENCOUNTER — Other Ambulatory Visit: Payer: Self-pay | Admitting: Physician Assistant

## 2016-04-11 ENCOUNTER — Encounter (HOSPITAL_COMMUNITY): Payer: Self-pay | Admitting: Psychiatry

## 2016-04-11 ENCOUNTER — Ambulatory Visit (INDEPENDENT_AMBULATORY_CARE_PROVIDER_SITE_OTHER): Payer: BC Managed Care – PPO | Admitting: Psychiatry

## 2016-04-11 VITALS — BP 118/82 | HR 75 | Ht 61.0 in | Wt 179.0 lb

## 2016-04-11 DIAGNOSIS — F331 Major depressive disorder, recurrent, moderate: Secondary | ICD-10-CM

## 2016-04-11 MED ORDER — ARIPIPRAZOLE 5 MG PO TABS: 5.0000 mg | ORAL_TABLET | Freq: Every day | ORAL | Status: DC

## 2016-04-11 MED ORDER — FLUOXETINE HCL 40 MG PO CAPS: ORAL_CAPSULE | ORAL | Status: DC

## 2016-04-11 NOTE — Progress Notes (Signed)
[name] [phone] Progress Note  Lauren [name] [id number] 35 y.o.  04/11/2016 4:17 PM  Chief Complaint:  I like [name].  In the beginning I felt better but now I don't think this seems to be working as good.            History of Present Illness:  Lauren [name] came for her follow-up appointment.  On her last visit we recommended to try [name] 2 mg since she developed a rash with the Lamictal.  She is taking 2 mg and in the beginning she felt much improvement with increased energy and motivation but lately she felt that her energy is not as good.  She continued to feel tired again.  Recently she's seen her primary care physician and she had blood work.  She has no new medication.  She reported no other side effects.  She is tolerating [name] and denies any EPS, tremors or shakes.  She denies any paranoia or any hallucination.  She still have irritability.  She is wondering if that [name] can further increase.  She is taking Prozac and Xanax which is prescribed by her primary care physician.  Her Xanax is written 1 mg twice a day but she only takes half tablet at bedtime.  Her sleep is good.  She denies drinking alcohol or using any illegal substances.  Her job is going okay.  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  She lives with her husband and 2 daughter.  Suicidal Ideation: No Plan Formed: No Patient has means to carry out plan: No  Homicidal Ideation: No Plan Formed: No Patient has means to carry out plan: No  Past Psychiatric History/Hospitalization(s) She remember taking antidepressant from primary care physiciansince 2002 when she was in college.  At that time she remembered feeling isolated, withdrawn, having crying spells and decreased energy.  She had tried Effexor and Prozac with good response by her primary care physician.  She also remembered taking Depakote, Zoloft, Celexa, Cymbalta, Wellbutrin and recently Brintellix. Patient was recently given Lamictal with good  response until she developed rash.Patient denies any history of abuse in the past.   Anxiety: Yes Bipolar Disorder: No Depression: Yes Mania: No Psychosis: No Schizophrenia: No Personality Disorder: No Hospitalization for psychiatric illness: No History of Electroconvulsive Shock Therapy: No Prior Suicide Attempts: No  Medical History; Patient has history of HSV, hypertension, GERD, hemorrhoids and surgical history of tonsillectomy and cholecystectomy.  She has obesity, fibromyalgia, hypothyroidism and vitamin D deficiency.  Her primary care physician is Dr. Idell Pickles. Patient denies any history of seizures.  fFmily History; Patient endorse mother has bipolar disorder.  Her half brother also has mental disorder.  Review of Systems  Musculoskeletal: Negative.   Skin: Negative for itching and rash.  Neurological: Negative for dizziness, tremors and headaches.  Psychiatric/Behavioral: Positive for depression. The patient is nervous/anxious.     Psychiatric: Agitation: No Hallucination: No Depressed Mood: Yes Insomnia: No Hypersomnia: No Altered Concentration: No Feels Worthless: No Grandiose Ideas: No Belief In Special Powers: No New/Increased Substance Abuse: No Compulsions: No  Neurologic: Headache: No Seizure: No Paresthesias: No   Musculoskeletal: Strength & Muscle Tone: within normal limits Gait & Station: normal Patient leans: N/A  Outpatient Encounter Prescriptions as of 04/11/2016  Medication Sig Note  . ALPRAZolam (XANAX) 1 MG tablet TAKE 1 TABLET BY MOUTH TWICE A DAY AS NEEDED   . ARIPiprazole ([name]) 5 MG tablet Take 1 tablet (5 mg total) by mouth daily.   . Cholecalciferol (  VITAMIN D) 2000 UNITS CAPS Take 8,000 Units by mouth daily.    . clobetasol (TEMOVATE) 0.05 % external solution Apply 1 application topically 2 (two) times daily.   Marland Kitchen FLUoxetine (PROZAC) 40 MG capsule TAKE 1 CAPSULE (40 MG TOTAL) BY MOUTH DAILY.   . hyoscyamine (LEVSIN) 0.125 MG  tablet Take 1 tablet (0.125 mg total) by mouth every 6 (six) hours as needed for cramping (diarrhea,nausea).   Marland Kitchen ibuprofen (ADVIL,MOTRIN) 200 MG tablet Take 200 mg by mouth every 6 (six) hours as needed for pain. Takes 4 tablets (800 mg) every 4-6 hrs prn   . levothyroxine (SYNTHROID, LEVOTHROID) 88 MCG tablet Take by mouth. 02/29/2016: Received from: [facility]  . magnesium 30 MG tablet Take 250 mg by mouth 1 day or 1 dose.   . metroNIDAZOLE (METROGEL) 0.75 % vaginal gel PLACE 1 APPLICATORFUL VAGINALLY AT BEDTIME. FOR 5 DAYS 09/25/2015: Received from: External Pharmacy  . nystatin (MYCOSTATIN) 100000 UNIT/ML suspension Take 5 mLs (500,000 Units total) by mouth 4 (four) times daily. Retain in mouth as long as possible (Swish and Spit).   . polyethylene glycol (MIRALAX / GLYCOLAX) packet Take 17 g by mouth daily.   . pravastatin (PRAVACHOL) 40 MG tablet TAKE 1 TABLET BY MOUTH DAILY   . [DISCONTINUED] amoxicillin-clavulanate (AUGMENTIN) 875-125 MG tablet  03/22/2016: Received from: External Pharmacy  . [DISCONTINUED] ARIPiprazole ([name]) 2 MG tablet Take 1 tablet (2 mg total) by mouth daily. 03/02/2016: SIGNED FORM  . [DISCONTINUED] esomeprazole (NEXIUM) 40 MG capsule Take 1 capsule (40 mg total) by mouth daily.   . [DISCONTINUED] FLUoxetine (PROZAC) 40 MG capsule TAKE 1 CAPSULE (40 MG TOTAL) BY MOUTH DAILY.   . [DISCONTINUED] lamoTRIgine (LAMICTAL) 100 MG tablet TAKE 1 TABLET (100 MG TOTAL) BY MOUTH DAILY. 03/19/2016: Received from: External Pharmacy  . [DISCONTINUED] ranitidine (ZANTAC) 300 MG tablet Take 1 tablet (300 mg total) by mouth at bedtime.   . [DISCONTINUED] simethicone (GAS-X) 80 MG chewable tablet Chew 1 tablet (80 mg total) by mouth every 6 (six) hours as needed for flatulence.   . [DISCONTINUED] verapamil (CALAN-SR) 120 MG CR tablet Take 1 tablet (120 mg total) by mouth at bedtime.    No facility-administered encounter medications on file as of 04/11/2016.      Recent Results (from the past 2160 hour(s))  CBC with Differential/Platelet     Status: None   Collection Time: 03/02/16  4:08 PM  Result Value Ref Range   WBC 8.8 3.8 - 10.8 K/uL   RBC 4.52 3.80 - 5.10 MIL/uL   Hemoglobin 12.6 11.7 - 15.5 g/dL   HCT 38.7 35.0 - 45.0 %   MCV 85.6 80.0 - 100.0 fL   MCH 27.9 27.0 - 33.0 pg   MCHC 32.6 32.0 - 36.0 g/dL   RDW 13.7 11.0 - 15.0 %   Platelets 193 140 - 400 K/uL   MPV 10.7 7.5 - 12.5 fL   Neutro Abs 5896 1500 - 7800 cells/uL   Lymphs Abs 2200 850 - 3900 cells/uL   Monocytes Absolute 616 200 - 950 cells/uL   Eosinophils Absolute 88 15 - 500 cells/uL   Basophils Absolute 0 0 - 200 cells/uL   Neutrophils Relative % 67 %   Lymphocytes Relative 25 %   Monocytes Relative 7 %   Eosinophils Relative 1 %   Basophils Relative 0 %   Smear Review Criteria for review not met     Comment: ** Please note change in unit of  measure and reference range(s). **  BASIC METABOLIC PANEL WITH GFR     Status: None   Collection Time: 03/02/16  4:08 PM  Result Value Ref Range   Sodium 140 135 - 146 mmol/L   Potassium 3.6 3.5 - 5.3 mmol/L   Chloride 104 98 - 110 mmol/L   CO2 28 20 - 31 mmol/L   Glucose, Bld 78 65 - 99 mg/dL   BUN 10 7 - 25 mg/dL   Creat 0.88 0.50 - 1.10 mg/dL   Calcium 9.4 8.6 - 10.2 mg/dL   GFR, Est African American >89 >=60 mL/min   GFR, Est Non African American 84 >=60 mL/min    Comment:   The estimated GFR is a calculation valid for adults (>=60 years old) that uses the CKD-EPI algorithm to adjust for age and sex. It is   not to be used for children, pregnant women, hospitalized patients,    patients on dialysis, or with rapidly changing kidney function. According to the NKDEP, eGFR >89 is normal, 60-89 shows mild impairment, 30-59 shows moderate impairment, 15-29 shows severe impairment and <15 is ESRD.     Hepatic function panel     Status: None   Collection Time: 03/02/16  4:08 PM  Result Value Ref Range   Total  Bilirubin 0.3 0.2 - 1.2 mg/dL   Bilirubin, Direct 0.1 <=0.2 mg/dL   Indirect Bilirubin 0.2 0.2 - 1.2 mg/dL   Alkaline Phosphatase 54 33 - 115 U/L   AST 14 10 - 30 U/L   ALT 16 6 - 29 U/L   Total Protein 6.8 6.1 - 8.1 g/dL   Albumin 4.2 3.6 - 5.1 g/dL  Magnesium     Status: None   Collection Time: 03/02/16  4:08 PM  Result Value Ref Range   Magnesium 1.9 1.5 - 2.5 mg/dL  Lipid panel     Status: Abnormal   Collection Time: 03/02/16  4:08 PM  Result Value Ref Range   Cholesterol 149 125 - 200 mg/dL   Triglycerides 319 (H) <150 mg/dL   HDL 37 (L) >=46 mg/dL   Total CHOL/HDL Ratio 4.0 <=5.0 Ratio   VLDL 64 (H) <30 mg/dL   LDL Cholesterol 48 <130 mg/dL    Comment:   Total Cholesterol/HDL Ratio:CHD Risk                        Coronary Heart Disease Risk Table                                        Men       Women          1/2 Average Risk              3.4        3.3              Average Risk              5.0        4.4           2X Average Risk              9.6        7.1           3X Average Risk             23.4  11.0 Use the calculated Patient Ratio above and the CHD Risk table  to determine the patient's CHD Risk.   Hemoglobin A1c     Status: None   Collection Time: 03/02/16  4:08 PM  Result Value Ref Range   Hgb A1c MFr Bld 5.5 <5.7 %    Comment:   For the purpose of screening for the presence of diabetes:   <5.7%       Consistent with the absence of diabetes 5.7-6.4 %   Consistent with increased risk for diabetes (prediabetes) >=6.5 %     Consistent with diabetes   This assay result is consistent with a decreased risk of diabetes.   Currently, no consensus exists regarding use of hemoglobin A1c for diagnosis of diabetes in children.   According to American Diabetes Association (ADA) guidelines, hemoglobin A1c <7.0% represents optimal control in non-pregnant diabetic patients. Different metrics may apply to specific patient populations. Standards of Medical Care  in Diabetes (ADA).      Mean Plasma Glucose 111 mg/dL  Insulin, random     Status: Abnormal   Collection Time: 03/02/16  4:08 PM  Result Value Ref Range   Insulin 21.5 (H) 2.0 - 19.6 uIU/mL    Comment: ** Please note change in reference range(s). **   This insulin assay shows strong cross-reactivity for some insulin analogs (lispro, aspart, and glargine) and much lower cross-reactivity with others (detemir, glulisine).   Iron and TIBC     Status: None   Collection Time: 03/02/16  4:08 PM  Result Value Ref Range   Iron 131 40 - 190 ug/dL   UIBC 216 125 - 400 ug/dL   TIBC 347 250 - 450 ug/dL   %SAT 38 11 - 50 %  Vitamin B12     Status: None   Collection Time: 03/02/16  4:08 PM  Result Value Ref Range   Vitamin B-12 826 200 - 1100 pg/mL  Urinalysis, Routine w reflex microscopic (not at [facility])     Status: Abnormal   Collection Time: 03/02/16  4:08 PM  Result Value Ref Range   Color, Urine YELLOW YELLOW    Comment: ** Please note change in unit of measure and reference range(s). **      APPearance CLEAR CLEAR   Specific Gravity, Urine 1.017 1.001 - 1.035   pH 7.0 5.0 - 8.0   Glucose, UA NEGATIVE NEGATIVE   Bilirubin Urine NEGATIVE NEGATIVE   Ketones, ur NEGATIVE NEGATIVE   Hgb urine dipstick 3+ (A) NEGATIVE   Protein, ur NEGATIVE NEGATIVE   Nitrite NEGATIVE NEGATIVE   Leukocytes, UA NEGATIVE NEGATIVE  Microalbumin / creatinine urine ratio     Status: None   Collection Time: 03/02/16  4:08 PM  Result Value Ref Range   Creatinine, Urine 124 20 - 320 mg/dL   Microalb, Ur 0.9 Not estab mg/dL   Microalb Creat Ratio 7 <30 mcg/mg creat    Comment: The ADA has defined abnormalities in albumin excretion as follows:           Category           Result                            (mcg/mg creatinine)                 Normal:    <30       Microalbuminuria:    30 - 299  Clinical albuminuria:    > or = 300   The ADA recommends that at least two of three specimens collected within a  3 - 6 month period be abnormal before considering a patient to be within a diagnostic category.     VITAMIN D 25 Hydroxy (Vit-D Deficiency, Fractures)     Status: None   Collection Time: 03/02/16  4:08 PM  Result Value Ref Range   Vit D, 25-Hydroxy 56 30 - 100 ng/mL    Comment: Vitamin D Status           25-OH Vitamin D        Deficiency                <20 ng/mL        Insufficiency         20 - 29 ng/mL        Optimal             > or = 30 ng/mL   For 25-OH Vitamin D testing on patients on D2-supplementation and patients for whom quantitation of D2 and D3 fractions is required, the QuestAssureD 25-OH VIT D, (D2,D3), LC/MS/MS is recommended: order code [phone] (patients > 2 yrs).   TSH     Status: None   Collection Time: 03/02/16  4:08 PM  Result Value Ref Range   TSH 0.78 mIU/L    Comment:   Reference Range   > or = 20 Years  0.40-4.50   Pregnancy Range First trimester  0.26-2.66 Second trimester 0.55-2.73 Third trimester  0.43-2.91     Urine Microscopic     Status: Abnormal   Collection Time: 03/02/16  4:08 PM  Result Value Ref Range   WBC, UA 0-5 <=5 WBC/HPF   RBC / HPF 10-20 (A) <=2 RBC/HPF   Squamous Epithelial / LPF 6-10 (A) <=5 HPF   Bacteria, UA FEW (A) NONE SEEN HPF   Crystals NONE SEEN NONE SEEN HPF   Casts NONE SEEN NONE SEEN LPF   Yeast NONE SEEN NONE SEEN HPF      Constitutional:  BP 118/82 mmHg  Pulse 75  Ht 5' 1"  (1.549 m)  Wt 179 lb (81.194 kg)  BMI 33.84 kg/m2   Mental Status Examination;  Patient is well groomed well dressed female who appears to be in her stated age. She is cooperative and maintained fair eye contact.  She described her mood Euthymic and her affect is neutral.  Her speech is soft, clear, coherent with normal tone and volume.  Her thought process slow but logical and goal-directed.  There were no flight of ideas or any loose association.  Her psychomotor activity is slow.  Her fund of knowledge is good.  She denies any auditory  or visual hallucination.  She denies any active or passive suicidal thoughts or homicidal thought.  There were no delusions, paranoia or any obsessive thoughts.  Her cognition is good.  She's alert and oriented 3.  There were no tremors or shakes.  Her insight judgment and impulse control is okay.   Established Problem, Stable/Improving (1), Review of Psycho-Social Stressors (1), Review or order clinical lab tests (1), Established Problem, Worsening (2), Review of Last Therapy Session (1), Review of Medication Regimen & Side Effects (2) and Review of New Medication or Change in Dosage (2)  Assessment: Axis I: Major depressive disorder, recurrent  Axis II: Deferred  Axis III:  Past Medical History  Diagnosis Date  . Depression   .  Hypothyroidism   . HSV-1 infection   . Fibromyalgia   . Preeclampsia   . Hypertension   . Anxiety   . Hemorrhoids   . Constipation   . GERD (gastroesophageal reflux disease)   . Other abnormal glucose   . Anemia    Plan:  I review her Recent blood work results and collateral information from primary care physician including current medication.  She is no longer taking Lamictal recommended to increase [name] 5 mg daily and continue Prozac 40 mg daily.  Discussed medication side effects and benefits.  She has been tolerating [name] very well and reported no side effects including tremors shakes or any EPS. Recommended to continue therapy with Lovena Le for counseling.  Discuss safety plan that anytime having active suicidal thoughts or homicidal thought and she need to call 911 or go to the local emergency room.  Patient is getting Xanax 1 mg half tablet from primary care physician.  Follow-up in 12 weeks.  ARFEEN,SYED T., MD 04/11/2016

## 2016-05-02 ENCOUNTER — Ambulatory Visit (INDEPENDENT_AMBULATORY_CARE_PROVIDER_SITE_OTHER): Payer: BC Managed Care – PPO | Admitting: Physician Assistant

## 2016-05-02 ENCOUNTER — Encounter: Payer: Self-pay | Admitting: Physician Assistant

## 2016-05-02 VITALS — BP 104/62 | HR 76 | Temp 97.8°F | Resp 16 | Ht 61.5 in | Wt 182.0 lb

## 2016-05-02 DIAGNOSIS — IMO0001 Reserved for inherently not codable concepts without codable children: Secondary | ICD-10-CM

## 2016-05-02 DIAGNOSIS — R5383 Other fatigue: Secondary | ICD-10-CM

## 2016-05-02 DIAGNOSIS — M25512 Pain in left shoulder: Secondary | ICD-10-CM

## 2016-05-02 DIAGNOSIS — R350 Frequency of micturition: Secondary | ICD-10-CM | POA: Diagnosis not present

## 2016-05-02 DIAGNOSIS — E039 Hypothyroidism, unspecified: Secondary | ICD-10-CM

## 2016-05-02 DIAGNOSIS — M25511 Pain in right shoulder: Secondary | ICD-10-CM

## 2016-05-02 DIAGNOSIS — D649 Anemia, unspecified: Secondary | ICD-10-CM

## 2016-05-02 LAB — CBC WITH DIFFERENTIAL/PLATELET
BASOS ABS: 0 {cells}/uL (ref 0–200)
Basophils Relative: 0 %
EOS ABS: 74 {cells}/uL (ref 15–500)
Eosinophils Relative: 1 %
HCT: 38.8 % (ref 35.0–45.0)
Hemoglobin: 12.6 g/dL (ref 11.7–15.5)
LYMPHS PCT: 35 %
Lymphs Abs: 2590 cells/uL (ref 850–3900)
MCH: 27.7 pg (ref 27.0–33.0)
MCHC: 32.5 g/dL (ref 32.0–36.0)
MCV: 85.3 fL (ref 80.0–100.0)
MONOS PCT: 7 %
MPV: 10.6 fL (ref 7.5–12.5)
Monocytes Absolute: 518 cells/uL (ref 200–950)
Neutro Abs: 4218 cells/uL (ref 1500–7800)
Neutrophils Relative %: 57 %
PLATELETS: 202 10*3/uL (ref 140–400)
RBC: 4.55 MIL/uL (ref 3.80–5.10)
RDW: 14.9 % (ref 11.0–15.0)
WBC: 7.4 10*3/uL (ref 3.8–10.8)

## 2016-05-02 NOTE — Patient Instructions (Signed)
Zantac (generic is okay)  300mg  at night for GERD  Fatigue Fatigue is feeling tired all of the time, a lack of energy, or a lack of motivation. Occasional or mild fatigue is often a normal response to activity or life in general. However, long-lasting (chronic) or extreme fatigue may indicate an underlying medical condition. HOME CARE INSTRUCTIONS  Watch your fatigue for any changes. The following actions may help to lessen any discomfort you are feeling:  Talk to your health care provider about how much sleep you need each night. Try to get the required amount every night.  Take medicines only as directed by your health care provider.  Eat a healthy and nutritious diet. Ask your health care provider if you need help changing your diet.  Drink enough fluid to keep your urine clear or pale yellow.  Practice ways of relaxing, such as yoga, meditation, massage therapy, or acupuncture.  Exercise regularly.   Change situations that cause you stress. Try to keep your work and personal routine reasonable.  Do not abuse illegal drugs.  Limit alcohol intake to no more than 1 drink per day for nonpregnant women and 2 drinks per day for men. One drink equals 12 ounces of beer, 5 ounces of wine, or 1 ounces of hard liquor.  Take a multivitamin, if directed by your health care provider. SEEK MEDICAL CARE IF:   Your fatigue does not get better.  You have a fever.   You have unintentional weight loss or gain.  You have headaches.   You have difficulty:   Falling asleep.  Sleeping throughout the night.  You feel angry, guilty, anxious, or sad.   You are unable to have a bowel movement (constipation).   You skin is dry.   Your legs or another part of your body is swollen.  SEEK IMMEDIATE MEDICAL CARE IF:   You feel confused.   Your vision is blurry.  You feel faint or pass out.   You have a severe headache.   You have severe abdominal, pelvic, or back pain.    You have chest pain, shortness of breath, or an irregular or fast heartbeat.   You are unable to urinate or you urinate less than normal.   You develop abnormal bleeding, such as bleeding from the rectum, vagina, nose, lungs, or nipples.  You vomit blood.   You have thoughts about harming yourself or committing suicide.   You are worried that you might harm someone else.    This information is not intended to replace advice given to you by your health care provider. Make sure you discuss any questions you have with your health care provider.   Document Released: 09/04/2007 Document Revised: 11/28/2014 Document Reviewed: 03/11/2014 Elsevier Interactive Patient Education [employer]2016 Elsevier Inc.

## 2016-05-02 NOTE — Progress Notes (Signed)
Subjective:    Patient ID: Lauren [name], female    DOB: Sep 25, 1981, 35 y.o.   MRN: [id number]  HPI 35 y.o. WF with history of HTN, hypothyroidism, depression presents with fatigue. Worse for the past, she follows with Dr. Lolly Mustache. She states she is sleeping well. She would like her labs checked. She denies CP, SOB, dizziness, HA. She has been doing weight watchers x 6 weeks. Does admit to increase stress at work, last week of school this week.    Blood pressure 104/62, pulse 76, temperature 97.8 F (36.6 C), temperature source Temporal, resp. rate 16, height 5' 1.5" (1.562 m), weight 182 lb (82.555 kg), last menstrual period 04/21/2016.  Current Outpatient Prescriptions on File Prior to Visit  Medication Sig Dispense Refill  . ALPRAZolam (XANAX) 1 MG tablet TAKE 1 TABLET BY MOUTH TWICE A DAY AS NEEDED 60 tablet 0  . ARIPiprazole ([name]) 5 MG tablet Take 1 tablet (5 mg total) by mouth daily. 30 tablet 2  . Cholecalciferol (VITAMIN D) 2000 UNITS CAPS Take 8,000 Units by mouth daily.     . clobetasol (TEMOVATE) 0.05 % external solution Apply 1 application topically 2 (two) times daily. 50 mL 0  . FLUoxetine (PROZAC) 40 MG capsule TAKE 1 CAPSULE (40 MG TOTAL) BY MOUTH DAILY. 30 capsule 2  . hyoscyamine (LEVSIN) 0.125 MG tablet Take 1 tablet (0.125 mg total) by mouth every 6 (six) hours as needed for cramping (diarrhea,nausea). 90 tablet 0  . ibuprofen (ADVIL,MOTRIN) 200 MG tablet Take 200 mg by mouth every 6 (six) hours as needed for pain. Takes 4 tablets (800 mg) every 4-6 hrs prn    . levothyroxine (SYNTHROID, LEVOTHROID) 88 MCG tablet Take by mouth.    . magnesium 30 MG tablet Take 250 mg by mouth 1 day or 1 dose.    . nystatin (MYCOSTATIN) 100000 UNIT/ML suspension Take 5 mLs (500,000 Units total) by mouth 4 (four) times daily. Retain in mouth as long as possible (Swish and Spit). 240 mL 0  . polyethylene glycol (MIRALAX / GLYCOLAX) packet Take 17 g by mouth daily.    . pravastatin  (PRAVACHOL) 40 MG tablet TAKE 1 TABLET BY MOUTH DAILY 90 tablet 0   No current facility-administered medications on file prior to visit.    Past Medical History  Diagnosis Date  . Depression   . Hypothyroidism   . HSV-1 infection   . Fibromyalgia   . Preeclampsia   . Hypertension   . Anxiety   . Hemorrhoids   . Constipation   . GERD (gastroesophageal reflux disease)   . Other abnormal glucose   . Anemia      Review of Systems  Constitutional: Positive for fatigue. Negative for fever, chills, diaphoresis, activity change, appetite change and unexpected weight change.  HENT: Negative.   Eyes: Negative.   Respiratory: Negative.  Negative for cough, chest tightness and shortness of breath.   Cardiovascular: Negative.  Negative for chest pain, palpitations and leg swelling.  Gastrointestinal: Positive for abdominal pain (GERD). Negative for nausea, vomiting, diarrhea, constipation, blood in stool, abdominal distention, anal bleeding and rectal pain.  Genitourinary: Positive for frequency. Negative for dysuria, urgency, hematuria, flank pain, difficulty urinating and dyspareunia.  Musculoskeletal: Positive for myalgias. Arthralgias: bilateral shoulders, bilateral legs, "body aches"  Skin: Negative.   Neurological: Negative.  Negative for dizziness, tremors, syncope and weakness.  Psychiatric/Behavioral: Negative.  Negative for sleep disturbance, self-injury and dysphoric mood.       Objective:  Physical Exam  Constitutional: She is oriented to person, place, and time. She appears well-developed and well-nourished. No distress.  HENT:  Head: Normocephalic and atraumatic.  Mouth/Throat: Oropharynx is clear and moist. No oropharyngeal exudate.  Eyes: Conjunctivae and EOM are normal. Pupils are equal, round, and reactive to light. No scleral icterus.  Neck: Normal range of motion. Neck supple. No JVD present. No thyromegaly present.  Cardiovascular: Normal rate, regular rhythm,  normal heart sounds and intact distal pulses.  Exam reveals no gallop and no friction rub.   No murmur heard. Pulmonary/Chest: Effort normal and breath sounds normal. No respiratory distress. She has no wheezes. She has no rales. She exhibits no tenderness.  Abdominal: Soft. Bowel sounds are normal. She exhibits no distension and no mass. There is no tenderness. There is no rebound and no guarding.  Musculoskeletal: Normal range of motion.  Lymphadenopathy:    She has no cervical adenopathy.  Neurological: She is alert and oriented to person, place, and time. She has normal strength. No cranial nerve deficit or sensory deficit. Coordination normal.  Skin: Skin is warm and dry. She is not diaphoretic.  Psychiatric: Her speech is normal. Judgment and thought content normal. She is slowed. Cognition and memory are normal. She exhibits a depressed mood.  Nursing note and vitals reviewed.       Assessment & Plan:  Fatigue- Discussed lifestyle modification as means of resolving problem, Training modifications discussed, See orders for lab evaluation, Discussed how depression can be a cause of fatigue if all labs are negative will discuss depression treatment/FM treatment.

## 2016-05-03 LAB — BASIC METABOLIC PANEL WITH GFR
BUN: 10 mg/dL (ref 7–25)
CALCIUM: 9.8 mg/dL (ref 8.6–10.2)
CO2: 26 mmol/L (ref 20–31)
CREATININE: 0.67 mg/dL (ref 0.50–1.10)
Chloride: 103 mmol/L (ref 98–110)
GFR, Est Non African American: >89 mL/min (ref >=60)
Glucose, Bld: 79 mg/dL (ref 65–99)
Potassium: 4 mmol/L (ref 3.5–5.3)
Sodium: 140 mmol/L (ref 135–146)

## 2016-05-03 LAB — HEPATIC FUNCTION PANEL
ALBUMIN: 4.5 g/dL (ref 3.6–5.1)
ALT: 16 U/L (ref 6–29)
AST: 14 U/L (ref 10–30)
Alkaline Phosphatase: 53 U/L (ref 33–115)
BILIRUBIN TOTAL: 0.3 mg/dL (ref 0.2–1.2)
Bilirubin, Direct: 0.1 mg/dL (ref <=0.2)
Indirect Bilirubin: 0.2 mg/dL (ref 0.2–1.2)
Total Protein: 6.8 g/dL (ref 6.1–8.1)

## 2016-05-03 LAB — SEDIMENTATION RATE: SED RATE: 6 mm/h (ref 0–20)

## 2016-05-03 LAB — URINALYSIS, ROUTINE W REFLEX MICROSCOPIC
BILIRUBIN URINE: NEGATIVE
Glucose, UA: NEGATIVE
Hgb urine dipstick: NEGATIVE
KETONES UR: NEGATIVE
Leukocytes, UA: NEGATIVE
Nitrite: NEGATIVE
PROTEIN: NEGATIVE
SPECIFIC GRAVITY, URINE: 1.015 (ref 1.001–1.035)
pH: 7 (ref 5.0–8.0)

## 2016-05-03 LAB — IRON AND TIBC
%SAT: 11 % (ref 11–50)
Iron: 43 ug/dL (ref 40–190)
TIBC: 392 ug/dL (ref 250–450)
UIBC: 349 ug/dL (ref 125–400)

## 2016-05-03 LAB — ANTI-DNA ANTIBODY, DOUBLE-STRANDED: ds DNA Ab: <1 IU/mL

## 2016-05-03 LAB — TSH: TSH: 1.88 m[IU]/L

## 2016-05-03 LAB — URINE CULTURE
Colony Count: NO GROWTH
ORGANISM ID, BACTERIA: NO GROWTH

## 2016-05-03 LAB — MAGNESIUM: Magnesium: 2 mg/dL (ref 1.5–2.5)

## 2016-05-03 LAB — ANA: ANA: NEGATIVE

## 2016-05-03 LAB — FERRITIN: FERRITIN: 23 ng/mL (ref 10–154)

## 2016-05-03 LAB — VITAMIN D 25 HYDROXY (VIT D DEFICIENCY, FRACTURES): VIT D 25 HYDROXY: 63 ng/mL (ref 30–100)

## 2016-06-03 ENCOUNTER — Telehealth (HOSPITAL_COMMUNITY): Payer: Self-pay

## 2016-06-03 DIAGNOSIS — F331 Major depressive disorder, recurrent, moderate: Secondary | ICD-10-CM

## 2016-06-03 NOTE — Telephone Encounter (Signed)
Patient calling, at her last visit on 5/22 Dr. Lolly MustacheArfeen put the patient on [name], patient is stating that it is not working. She would like a change, please review and advise, thank you

## 2016-06-06 ENCOUNTER — Ambulatory Visit: Payer: Self-pay | Admitting: Internal Medicine

## 2016-06-07 MED ORDER — ARIPIPRAZOLE 10 MG PO TABS: 10.0000 mg | ORAL_TABLET | Freq: Every day | ORAL | Status: DC

## 2016-06-07 NOTE — Telephone Encounter (Signed)
Sent [name] 10 mg 1 po qd to the pharmacy, called patient and informed her

## 2016-06-07 NOTE — Telephone Encounter (Signed)
We could increase to [name] 10mg . In Dr. Sheela StackArfeen's note she felt [name] was very effective in the beginning.

## 2016-06-16 ENCOUNTER — Encounter: Payer: Self-pay | Admitting: Internal Medicine

## 2016-06-16 ENCOUNTER — Ambulatory Visit (INDEPENDENT_AMBULATORY_CARE_PROVIDER_SITE_OTHER): Payer: BC Managed Care – PPO | Admitting: Internal Medicine

## 2016-06-16 VITALS — BP 90/60 | HR 68 | Temp 98.2°F | Resp 16 | Ht 61.5 in | Wt 184.0 lb

## 2016-06-16 DIAGNOSIS — E661 Drug-induced obesity: Secondary | ICD-10-CM | POA: Diagnosis not present

## 2016-06-16 DIAGNOSIS — Z79899 Other long term (current) drug therapy: Secondary | ICD-10-CM | POA: Diagnosis not present

## 2016-06-16 DIAGNOSIS — E039 Hypothyroidism, unspecified: Secondary | ICD-10-CM

## 2016-06-16 DIAGNOSIS — F32A Depression, unspecified: Secondary | ICD-10-CM

## 2016-06-16 DIAGNOSIS — F329 Major depressive disorder, single episode, unspecified: Secondary | ICD-10-CM

## 2016-06-16 DIAGNOSIS — E785 Hyperlipidemia, unspecified: Secondary | ICD-10-CM

## 2016-06-16 DIAGNOSIS — R7303 Prediabetes: Secondary | ICD-10-CM

## 2016-06-16 DIAGNOSIS — I10 Essential (primary) hypertension: Secondary | ICD-10-CM

## 2016-06-16 LAB — CBC WITH DIFFERENTIAL/PLATELET
BASOS PCT: 0 %
Basophils Absolute: 0 cells/uL (ref 0–200)
EOS ABS: 56 {cells}/uL (ref 15–500)
EOS PCT: 1 %
HCT: 39 % (ref 35.0–45.0)
Hemoglobin: 12.9 g/dL (ref 11.7–15.5)
LYMPHS PCT: 37 %
Lymphs Abs: 2072 cells/uL (ref 850–3900)
MCH: 28.7 pg (ref 27.0–33.0)
MCHC: 33.1 g/dL (ref 32.0–36.0)
MCV: 86.9 fL (ref 80.0–100.0)
MONOS PCT: 6 %
MPV: 10.3 fL (ref 7.5–12.5)
Monocytes Absolute: 336 cells/uL (ref 200–950)
NEUTROS ABS: 3136 {cells}/uL (ref 1500–7800)
Neutrophils Relative %: 56 %
PLATELETS: 177 10*3/uL (ref 140–400)
RBC: 4.49 MIL/uL (ref 3.80–5.10)
RDW: 14.7 % (ref 11.0–15.0)
WBC: 5.6 10*3/uL (ref 3.8–10.8)

## 2016-06-16 LAB — BASIC METABOLIC PANEL WITH GFR
BUN: 11 mg/dL (ref 7–25)
CHLORIDE: 106 mmol/L (ref 98–110)
CO2: 25 mmol/L (ref 20–31)
Calcium: 8.8 mg/dL (ref 8.6–10.2)
Creat: 0.65 mg/dL (ref 0.50–1.10)
GFR, Est African American: >89 mL/min (ref >=60)
Glucose, Bld: 80 mg/dL (ref 65–99)
POTASSIUM: 4.2 mmol/L (ref 3.5–5.3)
SODIUM: 140 mmol/L (ref 135–146)

## 2016-06-16 LAB — LIPID PANEL
Cholesterol: 177 mg/dL (ref 125–200)
HDL: 52 mg/dL (ref >=46)
LDL Cholesterol: 94 mg/dL (ref <130)
TRIGLYCERIDES: 155 mg/dL — AB (ref <150)
Total CHOL/HDL Ratio: 3.4 Ratio (ref <=5.0)
VLDL: 31 mg/dL — ABNORMAL HIGH (ref <30)

## 2016-06-16 LAB — HEPATIC FUNCTION PANEL
ALT: 17 U/L (ref 6–29)
AST: 18 U/L (ref 10–30)
Albumin: 4 g/dL (ref 3.6–5.1)
Alkaline Phosphatase: 53 U/L (ref 33–115)
Bilirubin, Direct: 0.1 mg/dL (ref <=0.2)
Indirect Bilirubin: 0.4 mg/dL (ref 0.2–1.2)
TOTAL PROTEIN: 6.6 g/dL (ref 6.1–8.1)
Total Bilirubin: 0.5 mg/dL (ref 0.2–1.2)

## 2016-06-16 LAB — HEMOGLOBIN A1C
Hgb A1c MFr Bld: 5.5 % (ref <5.7)
Mean Plasma Glucose: 111 mg/dL

## 2016-06-16 LAB — TSH: TSH: 3.99 m[IU]/L

## 2016-06-16 MED ORDER — PHENTERMINE HCL 37.5 MG PO TABS: 37.5000 mg | ORAL_TABLET | Freq: Every day | ORAL | 2 refills | Status: DC

## 2016-06-16 NOTE — Progress Notes (Signed)
Patient ID: Lauren [name], female   DOB: August 30, 1981, 35 y.o.   MRN: [id number]  Assessment and Plan:  Hypertension:  -Continue medication,  -monitor blood pressure at home.  -Continue DASH diet.   -Reminder to go to the ER if any CP, SOB, nausea, dizziness, severe HA, changes vision/speech, left arm numbness and tingling, and jaw pain.  Cholesterol: -Continue diet and exercise.  -Check cholesterol.   Pre-diabetes: -Continue diet and exercise.  -Check A1C  Vitamin D Def: -check level -continue medications.   Hypothyroidism -cont levothyroxine -TSH  Obesity -start phentermine -cont diet and exercise  Depression -following with psych -cont abilify -likely source of increased weight gain.    Continue diet and meds as discussed. Further disposition pending results of labs.  HPI 35 y.o. female  presents for 3 month follow up with hypertension, hyperlipidemia, prediabetes and vitamin D.   Her blood pressure has been controlled at home, today their BP is BP: 90/60.   She does not workout. She denies chest pain, shortness of breath, dizziness.   She is on cholesterol medication and denies myalgias. Her cholesterol is not at goal. The cholesterol last visit was:   Lab Results  Component Value Date   CHOL 149 03/02/2016   HDL 37 (L) 03/02/2016   LDLCALC 48 03/02/2016   LDLDIRECT 114.9 04/30/2012   TRIG 319 (H) 03/02/2016   CHOLHDL 4.0 03/02/2016     She has been working on diet and exercise for prediabetes, and denies foot ulcerations, hyperglycemia, hypoglycemia , increased appetite, nausea, paresthesia of the feet, polydipsia, polyuria, visual disturbances, vomiting and weight loss. Last A1C in the office was:  Lab Results  Component Value Date   HGBA1C 5.5 03/02/2016    Patient is on Vitamin D supplement.  Lab Results  Component Value Date   VD25OH 31 05/02/2016      She reports that she is doing better with the abilify increase.  She reports that she is still  fatigued, but is feeling better.  She has never taken any medications in the past to help with weight loss.  She reports that the abilify is making her gain weight.      Current Medications:  Current Outpatient Prescriptions on File Prior to Visit  Medication Sig Dispense Refill  . ALPRAZolam (XANAX) 1 MG tablet TAKE 1 TABLET BY MOUTH TWICE A DAY AS NEEDED 60 tablet 0  . ARIPiprazole ([name]) 10 MG tablet Take 1 tablet (10 mg total) by mouth daily. 30 tablet 2  . Cholecalciferol (VITAMIN D) 2000 UNITS CAPS Take 8,000 Units by mouth daily.     . clobetasol (TEMOVATE) 0.05 % external solution Apply 1 application topically 2 (two) times daily. 50 mL 0  . FLUoxetine (PROZAC) 40 MG capsule TAKE 1 CAPSULE (40 MG TOTAL) BY MOUTH DAILY. 30 capsule 2  . ibuprofen (ADVIL,MOTRIN) 200 MG tablet Take 200 mg by mouth every 6 (six) hours as needed for pain. Takes 4 tablets (800 mg) every 4-6 hrs prn    . levothyroxine (SYNTHROID, LEVOTHROID) 88 MCG tablet Take by mouth.    . magnesium 30 MG tablet Take 250 mg by mouth 1 day or 1 dose.    . polyethylene glycol (MIRALAX / GLYCOLAX) packet Take 17 g by mouth daily.    . pravastatin (PRAVACHOL) 40 MG tablet TAKE 1 TABLET BY MOUTH DAILY 90 tablet 0   No current facility-administered medications on file prior to visit.     Medical History:  Past Medical History:  Diagnosis Date  . Anemia   . Anxiety   . Constipation   . Depression   . Fibromyalgia   . GERD (gastroesophageal reflux disease)   . Hemorrhoids   . HSV-1 infection   . Hypertension   . Hypothyroidism   . Other abnormal glucose   . Preeclampsia     Allergies:  Allergies  Allergen Reactions  . Lamictal [Lamotrigine] Rash  . Doxycycline Other (See Comments)    GI UPSET  . Effexor [Venlafaxine] Other (See Comments)    DYSPHORIA  . Prednisone Other (See Comments)    FLUSHED  . Savella [Milnacipran Hcl] Other (See Comments)    NO RELIEF     Review of Systems:  Review of Systems   Constitutional: Positive for malaise/fatigue. Negative for chills and fever.  HENT: Negative for congestion, ear pain and sore throat.   Eyes: Negative.   Respiratory: Negative for cough, shortness of breath and wheezing.   Cardiovascular: Negative for chest pain, palpitations and leg swelling.  Gastrointestinal: Negative for abdominal pain, blood in stool, constipation, diarrhea, heartburn and melena.  Genitourinary: Negative.   Musculoskeletal: Positive for myalgias.  Skin: Negative.   Neurological: Negative for dizziness, sensory change, loss of consciousness and headaches.  Psychiatric/Behavioral: Negative for depression. The patient is not nervous/anxious and does not have insomnia.     Family history- Review and unchanged  Social history- Review and unchanged  Physical Exam: BP 90/60   Pulse 68   Temp 98.2 F (36.8 C) (Temporal)   Resp 16   Ht 5' 1.5" (1.562 m)   Wt 184 lb (83.5 kg)   LMP 06/16/2016   BMI 34.20 kg/m  Wt Readings from Last 3 Encounters:  06/16/16 184 lb (83.5 kg)  05/02/16 182 lb (82.6 kg)  03/02/16 176 lb 12.8 oz (80.2 kg)    General Appearance: Well nourished well developed, in no apparent distress. Eyes: PERRLA, EOMs, conjunctiva no swelling or erythema ENT/Mouth: Ear canals normal without obstruction, swelling, erythma, discharge.  TMs normal bilaterally.  Oropharynx moist, clear, without exudate, or postoropharyngeal swelling. Neck: Supple, thyroid normal,no cervical adenopathy  Respiratory: Respiratory effort normal, Breath sounds clear A&P without rhonchi, wheeze, or rale.  No retractions, no accessory usage. Cardio: RRR with no MRGs. Brisk peripheral pulses without edema.  Abdomen: Soft, + BS,  Non tender, no guarding, rebound, hernias, masses. Musculoskeletal: Full ROM, 5/5 strength, Normal gait Skin: Warm, dry without rashes, lesions, ecchymosis.  Neuro: Awake and oriented X 3, Cranial nerves intact. Normal muscle tone, no cerebellar  symptoms. Psych: Normal affect, Insight and Judgment appropriate.    Terri Piedra, PA-C 11:04 AM [facility]

## 2016-07-12 ENCOUNTER — Ambulatory Visit (HOSPITAL_COMMUNITY): Payer: Self-pay | Admitting: Psychiatry

## 2016-07-17 ENCOUNTER — Other Ambulatory Visit (HOSPITAL_COMMUNITY): Payer: Self-pay | Admitting: Psychiatry

## 2016-07-17 DIAGNOSIS — F331 Major depressive disorder, recurrent, moderate: Secondary | ICD-10-CM

## 2016-07-23 ENCOUNTER — Other Ambulatory Visit: Payer: Self-pay | Admitting: Internal Medicine

## 2016-08-01 ENCOUNTER — Encounter: Payer: Self-pay | Admitting: Internal Medicine

## 2016-08-01 ENCOUNTER — Ambulatory Visit (INDEPENDENT_AMBULATORY_CARE_PROVIDER_SITE_OTHER): Payer: BC Managed Care – PPO | Admitting: Internal Medicine

## 2016-08-01 VITALS — BP 122/76 | HR 64 | Temp 97.3°F | Resp 16 | Ht 61.5 in | Wt 189.0 lb

## 2016-08-01 DIAGNOSIS — R5383 Other fatigue: Secondary | ICD-10-CM

## 2016-08-01 NOTE — Progress Notes (Signed)
Assessment and Plan:   1. Other fatigue -screen for Wilson's disease given fugue states and drooling -likely needs an adjustment of psych meds -increased levothyroxine to 150 mcg daily - Copper, Free - TSH    HPI 35 y.o.female presents for 2 month follow up of fatigue and exhaustion.  She reports that she will get home and will sleep for a very long time.  She reports that she doesn't have to take any sleep medications anymore.  She is worried that her thyroid is out of range.  She reports that she is also having some drooling.  She reports that also feels like she has some spaced out episodes.  She also reports that she is drooling a lot.   She reports that she is still taking her medication on an empty stomach and waiting for at least 45 minutes prior to eating or taking anything else.  Past Medical History:  Diagnosis Date  . Anemia   . Anxiety   . Constipation   . Depression   . Fibromyalgia   . GERD (gastroesophageal reflux disease)   . Hemorrhoids   . HSV-1 infection   . Hypertension   . Hypothyroidism   . Other abnormal glucose   . Preeclampsia      Allergies  Allergen Reactions  . Lamictal [Lamotrigine] Rash  . Doxycycline Other (See Comments)    GI UPSET  . Effexor [Venlafaxine] Other (See Comments)    DYSPHORIA  . Prednisone Other (See Comments)    FLUSHED  . Savella [Milnacipran Hcl] Other (See Comments)    NO RELIEF      Current Outpatient Prescriptions on File Prior to Visit  Medication Sig Dispense Refill  . ALPRAZolam (XANAX) 1 MG tablet TAKE 1 TABLET BY MOUTH TWICE A DAY AS NEEDED 60 tablet 0  . ARIPiprazole ([name]) 10 MG tablet Take 1 tablet (10 mg total) by mouth daily. 30 tablet 2  . Cholecalciferol (VITAMIN D) 2000 UNITS CAPS Take 8,000 Units by mouth daily.     . clobetasol (TEMOVATE) 0.05 % external solution Apply 1 application topically 2 (two) times daily. 50 mL 0  . Ferrous Sulfate (IRON) 325 (65 Fe) MG TABS Take by mouth 2 (two) times  daily.    Marland Kitchen. FLUoxetine (PROZAC) 40 MG capsule TAKE ONE CAPSULE BY MOUTH EVERY DAY 30 capsule 1  . ibuprofen (ADVIL,MOTRIN) 200 MG tablet Take 200 mg by mouth every 6 (six) hours as needed for pain. Takes 4 tablets (800 mg) every 4-6 hrs prn    . levothyroxine (SYNTHROID, LEVOTHROID) 88 MCG tablet Take by mouth.    . magnesium 30 MG tablet Take 250 mg by mouth 1 day or 1 dose.    . phentermine (ADIPEX-P) 37.5 MG tablet Take 1 tablet (37.5 mg total) by mouth daily before breakfast. (Patient not taking: Reported on 08/01/2016) 30 tablet 2  . polyethylene glycol (MIRALAX / GLYCOLAX) packet Take 17 g by mouth daily.    . pravastatin (PRAVACHOL) 40 MG tablet TAKE 1 TABLET BY MOUTH DAILY 90 tablet 1   No current facility-administered medications on file prior to visit.     ROS: all negative except above.   Physical Exam: Filed Weights   08/01/16 1609  Weight: 189 lb (85.7 kg)   BP 122/76   Pulse 64   Temp 97.3 F (36.3 C)   Resp 16   Ht 5' 1.5" (1.562 m)   Wt 189 lb (85.7 kg)   BMI 35.13 kg/m  General Appearance:  Well developed well nourished, non-toxic appearing in no apparent distress. Eyes: PERRLA, EOMs, conjunctiva w/ no swelling or erythema or discharge Sinuses: No Frontal/maxillary tenderness ENT/Mouth: Ear canals clear without swelling or erythema.  TM's normal bilaterally with no retractions, bulging, or loss of landmarks.   Neck: Supple, thyroid normal, no notable JVD  Respiratory: Respiratory effort normal, Clear breath sounds anteriorly and posteriorly bilaterally without rales, rhonchi, wheezing or stridor. No retractions or accessory muscle usage. Cardio: RRR with no MRGs.   Abdomen: Soft, + BS.  Non tender, no guarding, rebound, hernias, masses.  Musculoskeletal: Full ROM, 5/5 strength, normal gait.  Skin: Warm, dry without rashes  Neuro: Awake and oriented X 3, Cranial nerves intact. Normal muscle tone, no cerebellar symptoms. Sensation intact.  Psych: normal affect,  Insight and Judgment appropriate.     Terri Piedra, PA-C 4:30 PM [facility]

## 2016-08-02 ENCOUNTER — Other Ambulatory Visit: Payer: Self-pay | Admitting: Internal Medicine

## 2016-08-02 LAB — TSH: TSH: 7.09 m[IU]/L — AB

## 2016-08-02 MED ORDER — LEVOTHYROXINE SODIUM 150 MCG PO TABS: 150.0000 ug | ORAL_TABLET | Freq: Every day | ORAL | 1 refills | Status: DC

## 2016-08-05 LAB — COPPER, FREE: Copper - Free, Serum/Plasma: 400 mcg/L

## 2016-08-23 ENCOUNTER — Encounter (HOSPITAL_COMMUNITY): Payer: Self-pay | Admitting: Psychiatry

## 2016-08-23 ENCOUNTER — Ambulatory Visit (INDEPENDENT_AMBULATORY_CARE_PROVIDER_SITE_OTHER): Payer: BC Managed Care – PPO | Admitting: Psychiatry

## 2016-08-23 DIAGNOSIS — F331 Major depressive disorder, recurrent, moderate: Secondary | ICD-10-CM

## 2016-08-23 MED ORDER — FLUOXETINE HCL 40 MG PO CAPS: 40.0000 mg | ORAL_CAPSULE | Freq: Every day | ORAL | 2 refills | Status: DC

## 2016-08-23 MED ORDER — ARIPIPRAZOLE 10 MG PO TABS: 10.0000 mg | ORAL_TABLET | Freq: Every day | ORAL | 2 refills | Status: DC

## 2016-08-23 NOTE — Progress Notes (Signed)
[facility] Progress Note  Lauren [name] [id number] 34 y.o.  08/23/2016 3:48 PM  Chief Complaint:    I'm not depressed but I am very tired and exhausted.  I like [name].  It is helping my depression and sleep.             History of Present Illness:  Lauren [name] came for her follow-up appointment.   She reported her depression is well controlled on [name] which was increased recently.  However she feels very tired and exhausted.  She gained more than 15 pounds in past 6 months.  Recently her thyroid medicines is also adjusted and now she is taking 150 g of paroxetine.  She used to take iron and phentermine which she stopped.  She admitted when she come from work she feels tired and stays to herself.  She has no motivation to do anything.  She has no tremors, shakes, EPS or any headaches.  However she has gained weight which could be due to hypothyroidism or side effects from [name].  She does not want to stop or change the [name] since her depression is well controlled on [name].  She denies drinking or using any illegal substances.  She likes her job.  She denies any feeling of hopelessness or worthlessness.  She denies any suicidal thoughts or homicidal thought.  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  She lives with her husband and 2 daughter.  She gained more than 15 pounds in past 4 months.  Suicidal Ideation: No Plan Formed: No Patient has means to carry out plan: No  Homicidal Ideation: No Plan Formed: No Patient has means to carry out plan: No  Past Psychiatric History/Hospitalization(s) She remember taking antidepressant from primary care physiciansince 2002 when she was in college.  At that time she remembered feeling isolated, withdrawn, having crying spells and decreased energy.  She had tried Effexor and Prozac with good response by her primary care physician.  She also remembered taking Depakote, Zoloft, Celexa, Cymbalta, Wellbutrin and recently Brintellix.  Patient was recently given Lamictal with good response until she developed rash.Patient denies any history of abuse in the past.   Anxiety: Yes Bipolar Disorder: No Depression: Yes Mania: No Psychosis: No Schizophrenia: No Personality Disorder: No Hospitalization for psychiatric illness: No History of Electroconvulsive Shock Therapy: No Prior Suicide Attempts: No  Medical History; Patient has history of HSV, hypertension, GERD, hemorrhoids and surgical history of tonsillectomy and cholecystectomy.  She has obesity, fibromyalgia, hypothyroidism and vitamin D deficiency.  Her primary care physician is Dr. Idell Pickles. Patient denies any history of seizures.  fFmily History; Patient endorse mother has bipolar disorder.  Her half brother also has mental disorder.  Review of Systems  Constitutional: Positive for malaise/fatigue.  Musculoskeletal: Negative.   Skin: Negative for itching and rash.  Neurological: Negative for dizziness, tremors and headaches.    Psychiatric: Agitation: No Hallucination: No Depressed Mood: Yes Insomnia: No Hypersomnia: No Altered Concentration: No Feels Worthless: No Grandiose Ideas: No Belief In Special Powers: No New/Increased Substance Abuse: No Compulsions: No  Neurologic: Headache: No Seizure: No Paresthesias: No   Musculoskeletal: Strength & Muscle Tone: within normal limits Gait & Station: normal Patient leans: N/A  Outpatient Encounter Prescriptions as of 08/23/2016  Medication Sig  . ARIPiprazole ([name]) 10 MG tablet Take 1 tablet (10 mg total) by mouth daily.  . Cholecalciferol (VITAMIN D) 2000 UNITS CAPS Take 8,000 Units by mouth daily.   . clobetasol (TEMOVATE) 0.05 % external solution  Apply 1 application topically 2 (two) times daily.  . Ferrous Sulfate (IRON) 325 (65 Fe) MG TABS Take by mouth 2 (two) times daily.  Marland Kitchen FLUoxetine (PROZAC) 40 MG capsule Take 1 capsule (40 mg total) by mouth daily.  Marland Kitchen ibuprofen (ADVIL,MOTRIN) 200  MG tablet Take 200 mg by mouth every 6 (six) hours as needed for pain. Takes 4 tablets (800 mg) every 4-6 hrs prn  . levothyroxine (SYNTHROID, LEVOTHROID) 150 MCG tablet Take 1 tablet (150 mcg total) by mouth daily before breakfast.  . magnesium 30 MG tablet Take 250 mg by mouth 1 day or 1 dose.  . phentermine (ADIPEX-P) 37.5 MG tablet Take 1 tablet (37.5 mg total) by mouth daily before breakfast. (Patient not taking: Reported on 08/01/2016)  . polyethylene glycol (MIRALAX / GLYCOLAX) packet Take 17 g by mouth daily.  . pravastatin (PRAVACHOL) 40 MG tablet TAKE 1 TABLET BY MOUTH DAILY  . [DISCONTINUED] ALPRAZolam (XANAX) 1 MG tablet TAKE 1 TABLET BY MOUTH TWICE A DAY AS NEEDED  . [DISCONTINUED] ARIPiprazole ([name]) 10 MG tablet Take 1 tablet (10 mg total) by mouth daily.  . [DISCONTINUED] FLUoxetine (PROZAC) 40 MG capsule TAKE ONE CAPSULE BY MOUTH EVERY DAY   No facility-administered encounter medications on file as of 08/23/2016.      Recent Results (from the past 2160 hour(s))  CBC with Differential/Platelet     Status: None   Collection Time: 06/16/16 11:18 AM  Result Value Ref Range   WBC 5.6 3.8 - 10.8 K/uL   RBC 4.49 3.80 - 5.10 MIL/uL   Hemoglobin 12.9 11.7 - 15.5 g/dL   HCT 39.0 35.0 - 45.0 %   MCV 86.9 80.0 - 100.0 fL   MCH 28.7 27.0 - 33.0 pg   MCHC 33.1 32.0 - 36.0 g/dL   RDW 14.7 11.0 - 15.0 %   Platelets 177 140 - 400 K/uL   MPV 10.3 7.5 - 12.5 fL   Neutro Abs 3,136 1,500 - 7,800 cells/uL   Lymphs Abs 2,072 850 - 3,900 cells/uL   Monocytes Absolute 336 200 - 950 cells/uL   Eosinophils Absolute 56 15 - 500 cells/uL   Basophils Absolute 0 0 - 200 cells/uL   Neutrophils Relative % 56 %   Lymphocytes Relative 37 %   Monocytes Relative 6 %   Eosinophils Relative 1 %   Basophils Relative 0 %   Smear Review Criteria for review not met     Comment: ** Please note change in unit of measure and reference range(s). **  BASIC METABOLIC PANEL WITH GFR     Status: None    Collection Time: 06/16/16 11:18 AM  Result Value Ref Range   Sodium 140 135 - 146 mmol/L   Potassium 4.2 3.5 - 5.3 mmol/L   Chloride 106 98 - 110 mmol/L   CO2 25 20 - 31 mmol/L   Glucose, Bld 80 65 - 99 mg/dL   BUN 11 7 - 25 mg/dL   Creat 0.65 0.50 - 1.10 mg/dL   Calcium 8.8 8.6 - 10.2 mg/dL   GFR, Est African American >89 >=60 mL/min   GFR, Est Non African American >89 >=60 mL/min  Hepatic function panel     Status: None   Collection Time: 06/16/16 11:18 AM  Result Value Ref Range   Total Bilirubin 0.5 0.2 - 1.2 mg/dL   Bilirubin, Direct 0.1 <=0.2 mg/dL   Indirect Bilirubin 0.4 0.2 - 1.2 mg/dL   Alkaline Phosphatase 53 33 - 115 U/L  AST 18 10 - 30 U/L   ALT 17 6 - 29 U/L   Total Protein 6.6 6.1 - 8.1 g/dL   Albumin 4.0 3.6 - 5.1 g/dL  Lipid panel     Status: Abnormal   Collection Time: 06/16/16 11:18 AM  Result Value Ref Range   Cholesterol 177 125 - 200 mg/dL   Triglycerides 155 (H) <150 mg/dL   HDL 52 >=46 mg/dL   Total CHOL/HDL Ratio 3.4 <=5.0 Ratio   VLDL 31 (H) <30 mg/dL   LDL Cholesterol 94 <130 mg/dL    Comment:   Total Cholesterol/HDL Ratio:CHD Risk                        Coronary Heart Disease Risk Table                                        Men       Women          1/2 Average Risk              3.4        3.3              Average Risk              5.0        4.4           2X Average Risk              9.6        7.1           3X Average Risk             23.4       11.0 Use the calculated Patient Ratio above and the CHD Risk table  to determine the patient's CHD Risk.   Hemoglobin A1c     Status: None   Collection Time: 06/16/16 11:18 AM  Result Value Ref Range   Hgb A1c MFr Bld 5.5 <5.7 %    Comment:   For the purpose of screening for the presence of diabetes:   <5.7%       Consistent with the absence of diabetes 5.7-6.4 %   Consistent with increased risk for diabetes (prediabetes) >=6.5 %     Consistent with diabetes   This assay result is consistent  with a decreased risk of diabetes.   Currently, no consensus exists regarding use of hemoglobin A1c for diagnosis of diabetes in children.   According to American Diabetes Association (ADA) guidelines, hemoglobin A1c <7.0% represents optimal control in non-pregnant diabetic patients. Different metrics may apply to specific patient populations. Standards of Medical Care in Diabetes (ADA).      Mean Plasma Glucose 111 mg/dL  TSH     Status: None   Collection Time: 06/16/16 11:18 AM  Result Value Ref Range   TSH 3.99 mIU/L    Comment:   Reference Range   > or = 20 Years  0.40-4.50   Pregnancy Range First trimester  0.26-2.66 Second trimester 0.55-2.73 Third trimester  0.43-2.91     Copper, Free     Status: None   Collection Time: 08/01/16  4:43 PM  Result Value Ref Range   Copper - Free, Serum/Plasma 400 mcg/L    Comment: Reporting Limit: 110 mcg/L [facility] derived data: Median, 1.2 mcg/L; range,  Less than 1 - 180 mcg/L (N = 937). 10 - 90% of concentrations range from  Less than 1 - 54 mcg/L. [facility] uses a direct measurement technique using  ultracentrifugation for free copper determinations.  Comparison of reported concentrations to techniques  where calculated free copper concentrations are  determined is not recommended. Analysis by Inductively Coupled Plasma/Mass Spectrometry(ICP/MS) Disclaimer: Specimens for elemental testing should be  collected in certified metal-free containers. Elevated  results for elemental testing may be caused by  environmental contamination at the time of specimen  collection and should be interpreted accordingly.  It  is recommended that unexpected elevated results be  verified by testing another specimen.   TSH     Status: Abnormal   Collection Time: 08/01/16  4:43 PM  Result Value Ref Range   TSH 7.09 (H) mIU/L    Comment:   Reference Range   > or = 20 Years  0.40-4.50   Pregnancy Range First trimester  0.26-2.66 Second  trimester 0.55-2.73 Third trimester  0.43-2.91         Constitutional:  BP 122/70   Pulse 82   Ht _0  (1.626 m)   Wt 190 lb 6.4 oz (86.4 kg)   BMI 32.68 kg/m    Mental Status Examination;  Patient is well groomed well dressed female who appears to be in her stated age.She has gained weight from the past.  She appears tired and exhausted but cooperative and maintained good eye contact.  She described her mood tired and her affect is mood appropriate. Her speech is soft, clear, coherent with normal tone and volume.  Her thought process slow but logical and goal-directed.  There were no flight of ideas or any loose association.  Her psychomotor activity is slow.  Her fund of knowledge is good.  She denies any auditory or visual hallucination.  She denies any active or passive suicidal thoughts or homicidal thought.  There were no delusions, paranoia or any obsessive thoughts.  Her cognition is good.  She's alert and oriented 3.  There were no tremors or shakes.  Her insight judgment and impulse control is okay.   Established Problem, Stable/Improving (1), Review of Psycho-Social Stressors (1), Review or order clinical lab tests (1), Established Problem, Worsening (2), Review of Last Therapy Session (1), Review of Medication Regimen & Side Effects (2) and Review of New Medication or Change in Dosage (2)  Assessment: Axis I: Major depressive disorder, recurrent  Axis II: Deferred  Axis III:  Past Medical History:  Diagnosis Date  . Anemia   . Anxiety   . Constipation   . Depression   . Fibromyalgia   . GERD (gastroesophageal reflux disease)   . Hemorrhoids   . HSV-1 infection   . Hypertension   . Hypothyroidism   . Other abnormal glucose   . Preeclampsia    Plan:  I review her blood work and collateral information from other providers.  Her [name] was increased from 5 mg to 10 mg.  She is now taking higher dose of levothyroxine.  I recommended to restart iron pills and  phentermine for weight loss.  She does not want to change her [name].  She has no tremors or shakes.  Encourage regular exercise and watching her calorie intake.  I will continue [name] 10 mg daily and Prozac 40 mg daily.  She is no longer taking Xanax which was given by other provider as she sleeping better. Recommended to continue therapy with Lovena Le  for counseling.  Discuss safety plan that anytime having active suicidal thoughts or homicidal thought and she need to call 911 or go to the local emergency room.  Follow-up in 12 weeks.  Caylin Nass T., MD 08/23/2016          Patient ID: Noralee Stain, female   DOB: 1981-02-12, 35 y.o.   MRN: [id number]

## 2016-09-19 ENCOUNTER — Ambulatory Visit (INDEPENDENT_AMBULATORY_CARE_PROVIDER_SITE_OTHER): Payer: BC Managed Care – PPO | Admitting: Internal Medicine

## 2016-09-19 VITALS — BP 120/80 | HR 68 | Temp 97.3°F | Resp 16 | Ht 61.0 in | Wt 191.2 lb

## 2016-09-19 DIAGNOSIS — Z79899 Other long term (current) drug therapy: Secondary | ICD-10-CM

## 2016-09-19 DIAGNOSIS — E039 Hypothyroidism, unspecified: Secondary | ICD-10-CM

## 2016-09-19 DIAGNOSIS — Z23 Encounter for immunization: Secondary | ICD-10-CM | POA: Diagnosis not present

## 2016-09-19 DIAGNOSIS — N898 Other specified noninflammatory disorders of vagina: Secondary | ICD-10-CM | POA: Diagnosis not present

## 2016-09-19 DIAGNOSIS — D509 Iron deficiency anemia, unspecified: Secondary | ICD-10-CM | POA: Diagnosis not present

## 2016-09-19 DIAGNOSIS — F332 Major depressive disorder, recurrent severe without psychotic features: Secondary | ICD-10-CM

## 2016-09-19 LAB — CBC WITH DIFFERENTIAL/PLATELET
BASOS PCT: 0 %
Basophils Absolute: 0 cells/uL (ref 0–200)
EOS ABS: 81 {cells}/uL (ref 15–500)
EOS PCT: 1 %
HCT: 41.3 % (ref 35.0–45.0)
Hemoglobin: 13.6 g/dL (ref 11.7–15.5)
LYMPHS PCT: 34 %
Lymphs Abs: 2754 cells/uL (ref 850–3900)
MCH: 29 pg (ref 27.0–33.0)
MCHC: 32.9 g/dL (ref 32.0–36.0)
MCV: 88.1 fL (ref 80.0–100.0)
MONOS PCT: 7 %
MPV: 10.9 fL (ref 7.5–12.5)
Monocytes Absolute: 567 cells/uL (ref 200–950)
NEUTROS ABS: 4698 {cells}/uL (ref 1500–7800)
Neutrophils Relative %: 58 %
PLATELETS: 191 10*3/uL (ref 140–400)
RBC: 4.69 MIL/uL (ref 3.80–5.10)
RDW: 13.7 % (ref 11.0–15.0)
WBC: 8.1 10*3/uL (ref 3.8–10.8)

## 2016-09-19 MED ORDER — MUPIROCIN 2 % EX OINT
1.0000 | TOPICAL_OINTMENT | Freq: Every day | CUTANEOUS | 0 refills | Status: DC
Start: 2016-09-19 — End: 2016-12-22

## 2016-09-19 NOTE — Progress Notes (Signed)
Assessment and Plan:  Hypertension:  -Continue medication,  -monitor blood pressure at home.  -Continue DASH diet.   -Reminder to go to the ER if any CP, SOB, nausea, dizziness, severe HA, changes vision/speech, left arm numbness and tingling, and jaw pain.  Cholesterol: -Continue diet and exercise.  -Check cholesterol.   Pre-diabetes: -Continue diet and exercise.  -Check A1C  Vitamin D Def: -check level -continue medications.   Hypothyroidism -TSH -levothyroxine  Depression -follows with psych -energy levels are improving  Vaginal sore -try bactroban ointment -exam normal -if no improvement will need to see obgyn  Continue diet and meds as discussed. Further disposition pending results of labs.  HPI 35 y.o. female  presents for 3 month follow up with hypertension, hyperlipidemia, prediabetes and vitamin D.   Her blood pressure has been controlled at home, today their BP is BP: 120/80.   She does not workout. She denies chest pain, shortness of breath, dizziness.  She reports that she is walking a little bit more. She is also doing some yardwork.    She is on cholesterol medication and denies myalgias. Her cholesterol is at goal. The cholesterol last visit was:   Lab Results  Component Value Date   CHOL 177 06/16/2016   HDL 52 06/16/2016   LDLCALC 94 06/16/2016   LDLDIRECT 114.9 04/30/2012   TRIG 155 (H) 06/16/2016   CHOLHDL 3.4 06/16/2016     She has been working on diet and exercise for prediabetes, and denies foot ulcerations, hyperglycemia, hypoglycemia , increased appetite, nausea, paresthesia of the feet, polydipsia, polyuria, visual disturbances, vomiting and weight loss. Last A1C in the office was:  Lab Results  Component Value Date   HGBA1C 5.5 06/16/2016    Patient is on Vitamin D supplement.  Lab Results  Component Value Date   VD25OH 2563 05/02/2016     She reports that she has been doing a little bit better at home.  She feels like increasing  the dose of the thyroid medication and restarting iron tablets helped a lot.  She reports that her psych meds have remained unchanged.  She is doing well with her psychiatrist.  She reports that he didn't switch anything.    She reports that she is having some soreness in her vagina.  She reports that this has been there for 2 months.  She has not seen her obgyn in a while.  She reports that it has been a year or two ago. She has had difficulty with sores and non-healing wounds there.  She reports that she did have to use a cream on it.  No vaginal discharge or vaginal dryness.  She has not been using any ointments on it at home.  Periods are normal.  Pap smears have always been normal.     Current Medications:  Current Outpatient Prescriptions on File Prior to Visit  Medication Sig Dispense Refill  . ARIPiprazole ([name]) 10 MG tablet Take 1 tablet (10 mg total) by mouth daily. 30 tablet 2  . Cholecalciferol (VITAMIN D) 2000 UNITS CAPS Take 8,000 Units by mouth daily.     . Ferrous Sulfate (IRON) 325 (65 Fe) MG TABS Take by mouth 2 (two) times daily.    Marland Kitchen. FLUoxetine (PROZAC) 40 MG capsule Take 1 capsule (40 mg total) by mouth daily. 30 capsule 2  . ibuprofen (ADVIL,MOTRIN) 200 MG tablet Take 200 mg by mouth every 6 (six) hours as needed for pain. Takes 4 tablets (800 mg) every 4-6 hrs prn    .  levothyroxine (SYNTHROID, LEVOTHROID) 150 MCG tablet Take 1 tablet (150 mcg total) by mouth daily before breakfast. 90 tablet 1  . magnesium 30 MG tablet Take 250 mg by mouth 1 day or 1 dose.    . phentermine (ADIPEX-P) 37.5 MG tablet Take 1 tablet (37.5 mg total) by mouth daily before breakfast. 30 tablet 2  . polyethylene glycol (MIRALAX / GLYCOLAX) packet Take 17 g by mouth daily.    . pravastatin (PRAVACHOL) 40 MG tablet TAKE 1 TABLET BY MOUTH DAILY 90 tablet 1   No current facility-administered medications on file prior to visit.     Medical History:  Past Medical History:  Diagnosis Date  .  Anemia   . Anxiety   . Constipation   . Depression   . Fibromyalgia   . GERD (gastroesophageal reflux disease)   . Hemorrhoids   . HSV-1 infection   . Hypertension   . Hypothyroidism   . Other abnormal glucose   . Preeclampsia     Allergies:  Allergies  Allergen Reactions  . Lamictal [Lamotrigine] Rash  . Doxycycline Other (See Comments)    GI UPSET  . Effexor [Venlafaxine] Other (See Comments)    DYSPHORIA  . Prednisone Other (See Comments)    FLUSHED  . Savella [Milnacipran Hcl] Other (See Comments)    NO RELIEF     Review of Systems:  Review of Systems  Constitutional: Negative for chills, fever and malaise/fatigue.  HENT: Negative for congestion, ear pain and sore throat.   Eyes: Negative.   Respiratory: Negative for cough, shortness of breath and wheezing.   Cardiovascular: Negative for chest pain, palpitations and leg swelling.  Gastrointestinal: Negative for abdominal pain, blood in stool, constipation, diarrhea, heartburn and melena.  Genitourinary: Negative.   Skin: Negative.   Neurological: Negative for dizziness, sensory change, loss of consciousness and headaches.  Psychiatric/Behavioral: Negative for depression. The patient is not nervous/anxious and does not have insomnia.     Family history- Review and unchanged  Social history- Review and unchanged  Physical Exam: BP 120/80   Pulse 68   Temp 97.3 F (36.3 C)   Resp 16   Ht 5\' 1"  (1.549 m)   Wt 191 lb 3.2 oz (86.7 kg)   BMI 36.13 kg/m  Wt Readings from Last 3 Encounters:  09/19/16 191 lb 3.2 oz (86.7 kg)  08/01/16 189 lb (85.7 kg)  06/16/16 184 lb (83.5 kg)    General Appearance: Well nourished well developed, in no apparent distress. Eyes: PERRLA, EOMs, conjunctiva no swelling or erythema ENT/Mouth: Ear canals normal without obstruction, swelling, erythma, discharge.  TMs normal bilaterally.  Oropharynx moist, clear, without exudate, or postoropharyngeal swelling. Neck: Supple, thyroid  normal,no cervical adenopathy  Respiratory: Respiratory effort normal, Breath sounds clear A&P without rhonchi, wheeze, or rale.  No retractions, no accessory usage. Cardio: RRR with no MRGs. Brisk peripheral pulses without edema.  Abdomen: Soft, + BS,  Non tender, no guarding, rebound, hernias, masses. Musculoskeletal: Full ROM, 5/5 strength, Normal gait Skin: Warm, dry without rashes, lesions, ecchymosis.  Neuro: Awake and oriented X 3, Cranial nerves intact. Normal muscle tone, no cerebellar symptoms. Psych: Normal affect, Insight and Judgment appropriate.    Terri Piedraourtney Forcucci, PA-C 5:01 PM [facility]

## 2016-09-20 LAB — TSH: TSH: 1.89 m[IU]/L

## 2016-09-20 LAB — HEPATIC FUNCTION PANEL
ALBUMIN: 4.5 g/dL (ref 3.6–5.1)
ALK PHOS: 52 U/L (ref 33–115)
ALT: 34 U/L — ABNORMAL HIGH (ref 6–29)
AST: 25 U/L (ref 10–30)
BILIRUBIN DIRECT: 0.1 mg/dL (ref <=0.2)
BILIRUBIN TOTAL: 0.4 mg/dL (ref 0.2–1.2)
Indirect Bilirubin: 0.3 mg/dL (ref 0.2–1.2)
Total Protein: 7 g/dL (ref 6.1–8.1)

## 2016-09-20 LAB — BASIC METABOLIC PANEL WITH GFR
BUN: 10 mg/dL (ref 7–25)
CO2: 23 mmol/L (ref 20–31)
CREATININE: 0.76 mg/dL (ref 0.50–1.10)
Calcium: 9.3 mg/dL (ref 8.6–10.2)
Chloride: 102 mmol/L (ref 98–110)
GFR, Est Non African American: >89 mL/min (ref >=60)
Glucose, Bld: 90 mg/dL (ref 65–99)
POTASSIUM: 4.2 mmol/L (ref 3.5–5.3)
SODIUM: 138 mmol/L (ref 135–146)

## 2016-09-20 LAB — IRON AND TIBC
%SAT: 12 % (ref 11–50)
Iron: 40 ug/dL (ref 40–190)
TIBC: 345 ug/dL (ref 250–450)
UIBC: 305 ug/dL (ref 125–400)

## 2016-11-22 ENCOUNTER — Ambulatory Visit (INDEPENDENT_AMBULATORY_CARE_PROVIDER_SITE_OTHER): Payer: BC Managed Care – PPO | Admitting: Psychiatry

## 2016-11-22 ENCOUNTER — Ambulatory Visit (HOSPITAL_COMMUNITY): Payer: Self-pay | Admitting: Psychiatry

## 2016-11-22 ENCOUNTER — Encounter (HOSPITAL_COMMUNITY): Payer: Self-pay | Admitting: Psychiatry

## 2016-11-22 DIAGNOSIS — F331 Major depressive disorder, recurrent, moderate: Secondary | ICD-10-CM | POA: Diagnosis not present

## 2016-11-22 DIAGNOSIS — Z818 Family history of other mental and behavioral disorders: Secondary | ICD-10-CM

## 2016-11-22 DIAGNOSIS — Z79899 Other long term (current) drug therapy: Secondary | ICD-10-CM | POA: Diagnosis not present

## 2016-11-22 MED ORDER — DESVENLAFAXINE SUCCINATE ER 50 MG PO TB24: 50.0000 mg | ORAL_TABLET | Freq: Every day | ORAL | 1 refills | Status: DC

## 2016-11-22 MED ORDER — ARIPIPRAZOLE 10 MG PO TABS: 10.0000 mg | ORAL_TABLET | Freq: Every day | ORAL | 1 refills | Status: DC

## 2016-11-22 NOTE — Progress Notes (Signed)
[place] Progress Note  Lauren [name] [id number] 36 y.o.  11/22/2016 11:56 AM  Chief Complaint:  I still feel the same.  I don't think my medicine working.  I have no energy.  My Christmas was horrible.  I did not do anything.  History of Present Illness:  Lauren [name] came for her follow-up appointment.  She is taking [name] and Prozac but she continued to feel depressed sad feeling tired and have no energy and motivation to do things.  She admitted her Christmas was horrible because she was staying by herself and did not do anything.  She took 2 weeks off from her work and she was hoping that she will enjoyed these holidays but she was disappointed because she has no motivation to do anything.  She sleeping okay.  She is easily tired, fatigued .  Her repeat iron level is normal.  Her TSH is also normal.  In the beginning she took phentermine but did not see any improvement and she stopped.  She is concerned about her weight.  She like to join [facility] but she has no desire.  She continued to see her therapist Lovena Le for counseling.  She denies any suicidal thoughts, paranoia, hallucination but admitted sometime feeling hopeless and worthless.  She has no tremors or shakes.  She does not want to stop [name] because she helped it is helping.  However she like to change the Prozac.  In the past she had tried Effexor, Celexa, Cymbalta, Wellbutrin, Lamictal, Depakote and Brintellix.  Patient is a Pharmacist, [employer].  She lives with her husband and 2 daughter.  Patient denies drinking alcohol or using any illegal substances.  Suicidal Ideation: No Plan Formed: No Patient has means to carry out plan: No  Homicidal Ideation: No Plan Formed: No Patient has means to carry out plan: No  Past Psychiatric History/Hospitalization(s) Reviewed.  She is taking antidepressant from primary care physiciansince 2002 when she was in college.  At that time she remembered feeling  isolated, withdrawn, having crying spells and decreased energy.  She had tried Effexor and Prozac with good response by her primary care physician.  She also remembered taking Depakote, Zoloft, Celexa, Cymbalta, Wellbutrin and recently Brintellix. Patient was recently given Lamictal with good response until she developed rash.Patient denies any history of abuse in the past.   Anxiety: Yes Bipolar Disorder: No Depression: Yes Mania: No Psychosis: No Schizophrenia: No Personality Disorder: No Hospitalization for psychiatric illness: No History of Electroconvulsive Shock Therapy: No Prior Suicide Attempts: No  Medical History; Reviewed .  Patient has history of HSV, hypertension, GERD, hemorrhoids and surgical history of tonsillectomy and cholecystectomy.  She has obesity, fibromyalgia, hypothyroidism and vitamin D deficiency.  Her primary care physician is Dr. Idell Pickles. Patient denies any history of seizures.  Family History; Patient endorse mother has bipolar disorder.  Her half brother also has mental disorder.  Review of Systems  Constitutional: Positive for malaise/fatigue.  HENT: Negative.   Respiratory: Negative.   Musculoskeletal: Negative.   Skin: Negative.   Neurological: Negative.   Psychiatric/Behavioral: Positive for depression.    Neurologic: Headache: No Seizure: No Paresthesias: No   Musculoskeletal: Strength & Muscle Tone: within normal limits Gait & Station: normal Patient leans: N/A  Outpatient Encounter Prescriptions as of 11/22/2016  Medication Sig  . ARIPiprazole ([name]) 10 MG tablet Take 1 tablet (10 mg total) by mouth daily.  . Cholecalciferol (VITAMIN D) 2000 UNITS CAPS Take 8,000 Units by mouth daily.   Marland Kitchen  desvenlafaxine (PRISTIQ) 50 MG 24 hr tablet Take 1 tablet (50 mg total) by mouth daily.  . Ferrous Sulfate (IRON) 325 (65 Fe) MG TABS Take by mouth 2 (two) times daily.  Marland Kitchen ibuprofen (ADVIL,MOTRIN) 200 MG tablet Take 200 mg by mouth every 6 (six)  hours as needed for pain. Takes 4 tablets (800 mg) every 4-6 hrs prn  . levothyroxine (SYNTHROID, LEVOTHROID) 150 MCG tablet Take 1 tablet (150 mcg total) by mouth daily before breakfast.  . magnesium 30 MG tablet Take 250 mg by mouth 1 day or 1 dose.  . mupirocin ointment (BACTROBAN) 2 % Apply 1 application topically daily. Plalce on vaginal wound twice daily  . polyethylene glycol (MIRALAX / GLYCOLAX) packet Take 17 g by mouth daily.  . pravastatin (PRAVACHOL) 40 MG tablet TAKE 1 TABLET BY MOUTH DAILY  . [DISCONTINUED] ARIPiprazole ([name]) 10 MG tablet Take 1 tablet (10 mg total) by mouth daily.  . [DISCONTINUED] FLUoxetine (PROZAC) 40 MG capsule Take 1 capsule (40 mg total) by mouth daily.  . [DISCONTINUED] phentermine (ADIPEX-P) 37.5 MG tablet Take 1 tablet (37.5 mg total) by mouth daily before breakfast. (Patient not taking: Reported on 11/22/2016)   No facility-administered encounter medications on file as of 11/22/2016.      Recent Results (from the past 2160 hour(s))  CBC with Differential/Platelet     Status: None   Collection Time: 09/19/16  5:41 PM  Result Value Ref Range   WBC 8.1 3.8 - 10.8 K/uL   RBC 4.69 3.80 - 5.10 MIL/uL   Hemoglobin 13.6 11.7 - 15.5 g/dL   HCT 41.3 35.0 - 45.0 %   MCV 88.1 80.0 - 100.0 fL   MCH 29.0 27.0 - 33.0 pg   MCHC 32.9 32.0 - 36.0 g/dL   RDW 13.7 11.0 - 15.0 %   Platelets 191 140 - 400 K/uL   MPV 10.9 7.5 - 12.5 fL   Neutro Abs 4,698 1,500 - 7,800 cells/uL   Lymphs Abs 2,754 850 - 3,900 cells/uL   Monocytes Absolute 567 200 - 950 cells/uL   Eosinophils Absolute 81 15 - 500 cells/uL   Basophils Absolute 0 0 - 200 cells/uL   Neutrophils Relative % 58 %   Lymphocytes Relative 34 %   Monocytes Relative 7 %   Eosinophils Relative 1 %   Basophils Relative 0 %   Smear Review Criteria for review not met   BASIC METABOLIC PANEL WITH GFR     Status: None   Collection Time: 09/19/16  5:41 PM  Result Value Ref Range   Sodium 138 135 - 146 mmol/L    Potassium 4.2 3.5 - 5.3 mmol/L   Chloride 102 98 - 110 mmol/L   CO2 23 20 - 31 mmol/L   Glucose, Bld 90 65 - 99 mg/dL   BUN 10 7 - 25 mg/dL   Creat 0.76 0.50 - 1.10 mg/dL   Calcium 9.3 8.6 - 10.2 mg/dL   GFR, Est African American >89 >=60 mL/min   GFR, Est Non African American >89 >=60 mL/min    Comment:   The estimated GFR is a calculation valid for adults (>=46 years old) that uses the CKD-EPI algorithm to adjust for age and sex. It is   not to be used for children, pregnant women, hospitalized patients,    patients on dialysis, or with rapidly changing kidney function. According to the NKDEP, eGFR >89 is normal, 60-89 shows mild impairment, 30-59 shows moderate impairment, 15-29 shows severe impairment and <  15 is ESRD.     Hepatic function panel     Status: Abnormal   Collection Time: 09/19/16  5:41 PM  Result Value Ref Range   Total Bilirubin 0.4 0.2 - 1.2 mg/dL   Bilirubin, Direct 0.1 <=0.2 mg/dL   Indirect Bilirubin 0.3 0.2 - 1.2 mg/dL   Alkaline Phosphatase 52 33 - 115 U/L   AST 25 10 - 30 U/L   ALT 34 (H) 6 - 29 U/L   Total Protein 7.0 6.1 - 8.1 g/dL   Albumin 4.5 3.6 - 5.1 g/dL  TSH     Status: None   Collection Time: 09/19/16  5:41 PM  Result Value Ref Range   TSH 1.89 mIU/L    Comment:   Reference Range   > or = 20 Years  0.40-4.50   Pregnancy Range First trimester  0.26-2.66 Second trimester 0.55-2.73 Third trimester  0.43-2.91     Iron and TIBC     Status: None   Collection Time: 09/19/16  5:41 PM  Result Value Ref Range   Iron 40 40 - 190 ug/dL   UIBC 305 125 - 400 ug/dL   TIBC 345 250 - 450 ug/dL   %SAT 12 11 - 50 %      Constitutional:  There were no vitals taken for this visit.   Mental Status Examination;  Patient is casually dressed and fairly groomed.  She maintained fair eye contact.  She appears tired and exhausted.  She described her mood sad and depressed.  Her affect is constricted.  Her speech is slow, clear, coherent.  Her  thought process is logical and goal-directed.  There were no paranoia, delusion or any obsessive thoughts.  Her attention concentration is okay.  There were no delusions or any psychotic symptoms.  Patient denies any auditory or visual hallucination.  She denies any active or passive suicidal thoughts or homicidal thought.  There were no tremors shakes or any EPS.  Her psychomotor activity is slow.  She's alert and oriented 3.  Her cognition is good.  Her insight judgment and impulse control is okay.    Review or order clinical lab tests (1), Review and summation of old records (2), Established Problem, Worsening (2), Review of Last Therapy Session (1), Review of Medication Regimen & Side Effects (2) and Review of New Medication or Change in Dosage (2)  Assessment: Axis I: Major depressive disorder, recurrent.    Axis III:  Past Medical History:  Diagnosis Date  . Anemia   . Anxiety   . Constipation   . Depression   . Fibromyalgia   . GERD (gastroesophageal reflux disease)   . Hemorrhoids   . HSV-1 infection   . Hypertension   . Hypothyroidism   . Other abnormal glucose   . Preeclampsia    Plan:  I review her collateral information from other provider including recent blood work .  Her iron , TSH is normal.  She is no longer taking phentermine because it did not work.  I will try Pristiq which she has never tried before.  She will cut down her Prozac and take 40 mg every other day for one week and then she will stop.  She will continue [name] 10 mg daily.  She has no tremors shakes or any EPS.  Discussed medication side effects and benefits.  Encourage regular exercise and watching her calorie intake.  She will continue her therapist Lovena Le for counseling.  I also recommended to call us  back if she has any question, concern if she feels worsening of the symptoms.  Discuss safety plan that anytime having active suicidal thoughts or homicidal thoughts and she need to call 911 or go to  local emergency room.  Follow-up in 6 weeks.   Lauren [name] T., MD 11/22/2016          Patient ID: Noralee Stain, female   DOB: [birth year], 36 y.o.   MRN: [id number]

## 2016-12-22 ENCOUNTER — Encounter: Payer: Self-pay | Admitting: Internal Medicine

## 2016-12-22 ENCOUNTER — Ambulatory Visit (INDEPENDENT_AMBULATORY_CARE_PROVIDER_SITE_OTHER): Payer: BC Managed Care – PPO | Admitting: Internal Medicine

## 2016-12-22 VITALS — BP 120/72 | HR 78 | Temp 98.0°F | Resp 16 | Ht 61.0 in

## 2016-12-22 DIAGNOSIS — J029 Acute pharyngitis, unspecified: Secondary | ICD-10-CM

## 2016-12-22 MED ORDER — CHLORPHEN-PE-ACETAMINOPHEN 4-10-325 MG PO TABS: 1.0000 | ORAL_TABLET | Freq: Four times a day (QID) | ORAL | 0 refills | Status: DC | PRN

## 2016-12-22 MED ORDER — DEXAMETHASONE SODIUM PHOSPHATE 10 MG/ML IJ SOLN
10.0000 mg | Freq: Once | INTRAMUSCULAR | Status: AC
  Administered 2016-12-23: 10 mg via INTRAMUSCULAR

## 2016-12-22 MED ORDER — AZITHROMYCIN 250 MG PO TABS: ORAL_TABLET | ORAL | 0 refills | Status: DC

## 2016-12-22 MED ORDER — FLUTICASONE PROPIONATE 50 MCG/ACT NA SUSP: 2.0000 | Freq: Every day | NASAL | 0 refills | Status: DC

## 2016-12-22 NOTE — Patient Instructions (Signed)
Please take claritin, zyrtec, or allegra once daily to help with drying up congestion.  Please take zpak daily until it is gone.  Please use 2 sprays of flonase per nostril right before bedtime.  Please take norel AD samples every 6 hours as needed for congestion.  Please drink plenty of fluids to help thin out mucous.  You can use nasal saline as often as you can tolerate to help get rid of mucous.    Please call the office or send a mychart message in a week if you have no improvement.

## 2016-12-22 NOTE — Progress Notes (Signed)
HPI  Patient presents to the office for evaluation of sore throat and sinus congestion.  It has been going on for 2 weeks.  Patient reports mild cough but not very disruptive.  They also endorse change in voice, chills, postnasal drip and nasal congestion, yellow nasal sputum, headaches.  .  They have tried advil sinus and cold and mucinex.  They report that nothing has worked.  They admits to other sick contacts.  Review of Systems  Constitutional: Positive for malaise/fatigue. Negative for chills and fever.  HENT: Positive for congestion, ear pain, hearing loss and sore throat.   Respiratory: Positive for cough. Negative for sputum production, shortness of breath and wheezing.   Cardiovascular: Negative for chest pain, palpitations and leg swelling.  Neurological: Positive for headaches.    PE:  Vitals:   12/22/16 1625  BP: 120/72  Pulse: 78  Resp: 16  Temp: 98 F (36.7 C)     General:  Alert and non-toxic, WDWN, NAD HEENT: NCAT, PERLA, EOM normal, no occular discharge or erythema.  Nasal mucosal edema with sinus tenderness to palpation.  Oropharynx clear with minimal oropharyngeal edema and erythema.  Mucous membranes moist and pink. Neck:  Cervical adenopathy Chest:  RRR no MRGs.  Lungs clear to auscultation A&P with no wheezes rhonchi or rales.   Abdomen: +BS x 4 quadrants, soft, non-tender, no guarding, rigidity, or rebound. Skin: warm and dry no rash Neuro: A&Ox4, CN II-XII grossly intact  Assessment and Plan:   1. Acute pharyngitis, unspecified etiology -daily anthistamine -benadryl if desired at bedtime - fluticasone (FLONASE) 50 MCG/ACT nasal spray; Place 2 sprays into both nostrils daily.  Dispense: 16 g; Refill: 0 - Chlorphen-PE-Acetaminophen 4-10-325 MG TABS; Take 1 tablet by mouth every 6 (six) hours as needed.  Dispense: 10 tablet; Refill: 0 - dexamethasone (DECADRON) injection 10 mg; Inject 1 mL (10 mg total) into the muscle once. - azithromycin (ZITHROMAX  Z-PAK) 250 MG tablet; 2 po day one, then 1 daily x 4 days  Dispense: 6 tablet; Refill: 0

## 2016-12-23 DIAGNOSIS — J029 Acute pharyngitis, unspecified: Secondary | ICD-10-CM

## 2016-12-27 ENCOUNTER — Ambulatory Visit (HOSPITAL_COMMUNITY): Payer: Self-pay | Admitting: Psychiatry

## 2017-01-03 ENCOUNTER — Ambulatory Visit (INDEPENDENT_AMBULATORY_CARE_PROVIDER_SITE_OTHER): Payer: BC Managed Care – PPO | Admitting: Psychiatry

## 2017-01-03 ENCOUNTER — Encounter (HOSPITAL_COMMUNITY): Payer: Self-pay | Admitting: Psychiatry

## 2017-01-03 DIAGNOSIS — Z841 Family history of disorders of kidney and ureter: Secondary | ICD-10-CM

## 2017-01-03 DIAGNOSIS — Z833 Family history of diabetes mellitus: Secondary | ICD-10-CM

## 2017-01-03 DIAGNOSIS — Z811 Family history of alcohol abuse and dependence: Secondary | ICD-10-CM

## 2017-01-03 DIAGNOSIS — Z8371 Family history of colonic polyps: Secondary | ICD-10-CM

## 2017-01-03 DIAGNOSIS — Z801 Family history of malignant neoplasm of trachea, bronchus and lung: Secondary | ICD-10-CM

## 2017-01-03 DIAGNOSIS — Z808 Family history of malignant neoplasm of other organs or systems: Secondary | ICD-10-CM

## 2017-01-03 DIAGNOSIS — Z8249 Family history of ischemic heart disease and other diseases of the circulatory system: Secondary | ICD-10-CM | POA: Diagnosis not present

## 2017-01-03 DIAGNOSIS — Z9889 Other specified postprocedural states: Secondary | ICD-10-CM

## 2017-01-03 DIAGNOSIS — F331 Major depressive disorder, recurrent, moderate: Secondary | ICD-10-CM | POA: Diagnosis not present

## 2017-01-03 DIAGNOSIS — Z9049 Acquired absence of other specified parts of digestive tract: Secondary | ICD-10-CM

## 2017-01-03 DIAGNOSIS — Z79899 Other long term (current) drug therapy: Secondary | ICD-10-CM

## 2017-01-03 DIAGNOSIS — Z818 Family history of other mental and behavioral disorders: Secondary | ICD-10-CM

## 2017-01-03 DIAGNOSIS — Z888 Allergy status to other drugs, medicaments and biological substances status: Secondary | ICD-10-CM

## 2017-01-03 MED ORDER — DESVENLAFAXINE SUCCINATE ER 50 MG PO TB24: 50.0000 mg | ORAL_TABLET | Freq: Two times a day (BID) | ORAL | 2 refills | Status: DC

## 2017-01-03 MED ORDER — ARIPIPRAZOLE 10 MG PO TABS: 10.0000 mg | ORAL_TABLET | Freq: Every day | ORAL | 2 refills | Status: DC

## 2017-01-03 NOTE — Progress Notes (Signed)
BH MD/PA/NP OP Progress Note  01/03/2017 4:12 PM Lauren [name]  MRN:  [id number]  Chief Complaint:  Subjective:  I like new medication.  I have more energy.  HPI: Lauren [name] came for her follow-up appointment.  On her last visit we started her on Pristiq and she is feeling much better.  She is no longer taking Prozac.  She is taking 50 mg Pristiq and reported no side effects.  She is less anxious and less depressed.  She has fewer crying spells.  She is going to work and have more motivation.  Her energy level is also improved from the past.  She has fewer crying spells and denies any suicidal thoughts or any feeling of hopelessness or worthlessness.  She is seeing Pervis Hocking every 3 weeks for therapy.  She like to continue counseling with her.  Patient denies any hallucination, paranoia, self abusive behavior.  She denies drinking alcohol or using any illegal substances.  She lives with her husband and 2 daughter.  Patient denies drinking alcohol or using any illegal substances.  She has no tremors shakes or any EPS.  Visit Diagnosis:    ICD-9-CM ICD-10-CM   1. Major depressive disorder, recurrent episode, moderate (HCC) 296.32 F33.1 desvenlafaxine (PRISTIQ) 50 MG 24 hr tablet     ARIPiprazole ([name]) 10 MG tablet    Past Psychiatric History: Reviewed. Reviewed.  She is taking antidepressant from primary care physiciansince 2002 when she was in college.  At that time she remembered feeling isolated, withdrawn, having crying spells and decreased energy.  She had tried Effexor and Prozac with good response by her primary care physician.  She also remembered taking Depakote, Zoloft, Celexa, Cymbalta, Wellbutrin and recently Brintellix and Prozac. Patient was recently given Lamictal with good response until she developed rash.Patient denies any history of abuse in the past.    Past Medical History:  Past Medical History:  Diagnosis Date  . Anemia   . Anxiety   . Constipation   . Depression    . Fibromyalgia   . GERD (gastroesophageal reflux disease)   . Hemorrhoids   . HSV-1 infection   . Hypertension   . Hypothyroidism   . Other abnormal glucose   . Preeclampsia     Past Surgical History:  Procedure Laterality Date  . CHOLECYSTECTOMY  2006  . TONSILLECTOMY    . WISDOM TOOTH EXTRACTION      Family Psychiatric History: Reviewed.  Family History:  Family History  Problem Relation Age of Onset  . Heart disease Maternal Grandmother   . Diabetes Maternal Grandmother   . Cancer Maternal Grandmother     SKIN  . Hyperlipidemia Maternal Grandmother   . Hypertension Maternal Grandmother   . Hypothyroidism Maternal Grandmother   . Hypothyroidism Mother   . Cervical cancer Mother   . Bipolar disorder Mother   . Diabetes Mother   . Colon polyps Mother   . Bipolar disorder Brother   . Kidney disease Brother   . Alcohol abuse Brother   . Lung cancer Maternal Grandfather   . Heart disease Maternal Aunt   . Diabetes Maternal Aunt   . Diabetes Cousin   . Colon cancer Neg Hx   . Hypothyroidism Daughter     Social History:  Social History   Social History  . Marital status: Married    Spouse name: N/A  . Number of children: 2  . Years of education: N/A   Occupational History  . TEACHER [employer]  Social History Main Topics  . Smoking status: Never Smoker  . Smokeless tobacco: Never Used  . Alcohol use No     Comment: occ  . Drug use: No  . Sexual activity: Yes    Birth control/ protection: None     Comment: husband has had a vasectomy   Other Topics Concern  . Not on file   Social History Narrative   4 caffeine drinks daily     Allergies:  Allergies  Allergen Reactions  . Lamictal [Lamotrigine] Rash  . Doxycycline Other (See Comments)    GI UPSET  . Effexor [Venlafaxine] Other (See Comments)    DYSPHORIA  . Prednisone Other (See Comments)    FLUSHED  . Savella [Milnacipran Hcl] Other (See Comments)    NO RELIEF     Metabolic Disorder Labs: Lab Results  Component Value Date   HGBA1C 5.5 06/16/2016   MPG 111 06/16/2016   MPG 111 03/02/2016   No results found for: PROLACTIN Lab Results  Component Value Date   CHOL 177 06/16/2016   TRIG 155 (H) 06/16/2016   HDL 52 06/16/2016   CHOLHDL 3.4 06/16/2016   VLDL 31 (H) 06/16/2016   LDLCALC 94 06/16/2016   LDLCALC 48 03/02/2016     Current Medications: Current Outpatient Prescriptions  Medication Sig Dispense Refill  . ARIPiprazole ([name]) 10 MG tablet Take 1 tablet (10 mg total) by mouth daily. 30 tablet 1  . azithromycin (ZITHROMAX Z-PAK) 250 MG tablet 2 po day one, then 1 daily x 4 days 6 tablet 0  . Chlorphen-PE-Acetaminophen 4-10-325 MG TABS Take 1 tablet by mouth every 6 (six) hours as needed. 10 tablet 0  . Cholecalciferol (VITAMIN D) 2000 UNITS CAPS Take 8,000 Units by mouth daily.     Marland Kitchen desvenlafaxine (PRISTIQ) 50 MG 24 hr tablet Take 1 tablet (50 mg total) by mouth daily. 30 tablet 1  . Ferrous Sulfate (IRON) 325 (65 Fe) MG TABS Take by mouth 2 (two) times daily.    . fluticasone (FLONASE) 50 MCG/ACT nasal spray Place 2 sprays into both nostrils daily. 16 g 0  . ibuprofen (ADVIL,MOTRIN) 200 MG tablet Take 200 mg by mouth every 6 (six) hours as needed for pain. Takes 4 tablets (800 mg) every 4-6 hrs prn    . levothyroxine (SYNTHROID, LEVOTHROID) 150 MCG tablet Take 1 tablet (150 mcg total) by mouth daily before breakfast. 90 tablet 1  . magnesium 30 MG tablet Take 250 mg by mouth 1 day or 1 dose.    . polyethylene glycol (MIRALAX / GLYCOLAX) packet Take 17 g by mouth daily.    . pravastatin (PRAVACHOL) 40 MG tablet TAKE 1 TABLET BY MOUTH DAILY 90 tablet 1   No current facility-administered medications for this visit.     Neurologic: Headache: No Seizure: No Paresthesias: No  Musculoskeletal: Strength & Muscle Tone: within normal limits Gait & Station: normal Patient leans: N/A  Psychiatric Specialty Exam: Review of Systems   Constitutional: Negative.   HENT: Negative.   Skin: Negative.   Psychiatric/Behavioral: The patient is nervous/anxious.     Blood pressure 132/78, pulse 94, height 5\' 1"  (1.549 m), weight 195 lb 3.2 oz (88.5 kg), last menstrual period 12/15/2016.Body mass index is 36.88 kg/m.  General Appearance: Casual  Eye Contact:  Fair  Speech:  Slow  Volume:  Decreased  Mood:  Anxious  Affect:  Congruent  Thought Process:  Goal Directed  Orientation:  Full (Time, Place, and Person)  Thought Content: Rumination  Suicidal Thoughts:  No  Homicidal Thoughts:  No  Memory:  Immediate;   Good Recent;   Good Remote;   Good  Judgement:  Good  Insight:  Good  Psychomotor Activity:  Decreased  Concentration:  Concentration: Fair and Attention Span: Fair  Recall:  Good  Fund of Knowledge: Good  Language: Good  Akathisia:  No  Handed:  Right  AIMS (if indicated):  0  Assets:  Communication Skills Desire for Improvement Financial Resources/Insurance Housing Resilience  ADL's:  Intact  Cognition: WNL  Sleep:  Fair    Assessment: Major depressive disorder, recurrent.  Plan: Patient is doing better on Pristiq.  I recommended to increase dose 50 mg twice a day as patient is tolerating this medication much better.  She is feeling more energetic and less depressed and less anxious.  Continue [name] 10 mg daily.  Recommended to continue counseling with came.  Recommended to call us back if she has any question, concern if she feels worsening of the symptom.  Follow-up in 3 months.  Discuss safety plan that anytime having active suicidal thoughts or homicidal thoughts and she need to call 911 or go to the local emergency room.  Hatcher Froning T., MD 01/03/2017, 4:12 PM

## 2017-01-29 ENCOUNTER — Other Ambulatory Visit: Payer: Self-pay | Admitting: Internal Medicine

## 2017-01-30 ENCOUNTER — Other Ambulatory Visit: Payer: Self-pay | Admitting: Internal Medicine

## 2017-03-07 NOTE — Progress Notes (Signed)
Complete Physical  Assessment and Plan:   Routine general medical examination at a health care facility  Essential hypertension - continue medications, DASH diet, exercise and monitor at home. Call if greater than 130/80.  - Urinalysis, Routine w reflex microscopic (not at [facility]) - Microalbumin / creatinine urine ratio - EKG 12-Lead - TSH   Hypothyroidism, unspecified hypothyroidism type -TSH -cont levothyroxine -possible source of chills?  Prediabetes Discussed general issues about diabetes pathophysiology and management., Educational material distributed., Suggested low cholesterol diet., Encouraged aerobic exercise., Discussed foot care., Reminded to get yearly retinal exam. - Hemoglobin A1c - Insulin, random   Hyperlipidemia LDL goal <100 -continue medications, check lipids, decrease fatty foods, increase activity.  - Lipid panel  Depression -cont therapy -cont to see Dr. Lolly Mustache -cont 0.5 mg xanax prn  Medication management - CBC with Differential/Platelet - BASIC METABOLIC PANEL WITH GFR - Hepatic function panel - Magnesium  Vitamin D deficiency -cont supplement - VITAMIN D 25 Hydroxy (Vit-D Deficiency, Fractures)  Class 2 severe obesity due to excess calories with serious comorbidity and body mass index (BMI) of 38.0 to 38.9 in adult (HCC) -     phentermine (ADIPEX-P) 37.5 MG tablet; Take 1 tablet (37.5 mg total) by mouth daily before breakfast.  long discussion about weight loss, diet, and exercise  Headache associated with sexual activity with dizziness -     MRI Head W Wo Contrast - rule out aneurysm  Vitamin D deficiency -     VITAMIN D 25 Hydroxy (Vit-D Deficiency, Fractures)   Discussed med's effects and SE's. Screening labs and tests as requested with regular follow-up as recommended.  HPI  36 y.o. female  presents for a complete physical.  Her blood pressure has been controlled at home, today their BP is BP: 118/74.  She does workout. She denies  chest pain, shortness of breath, dizziness.   She complains of HA with intercourse x 1 month, dizziness with sneezing or BM's, no vision changes, no nausea.   She is on cholesterol medication and denies myalgias. Her cholesterol is at goal. The cholesterol last visit was:  Lab Results  Component Value Date   CHOL 177 06/16/2016   HDL 52 06/16/2016   LDLCALC 94 06/16/2016   LDLDIRECT 114.9 04/30/2012   TRIG 155 (H) 06/16/2016   CHOLHDL 3.4 06/16/2016  . She has been working on diet and exercise for prediabetes, she is not on bASA, she is not on ACE/ARB and denies foot ulcerations, hyperglycemia, hypoglycemia , increased appetite, nausea, paresthesia of the feet, polydipsia, polyuria, visual disturbances, vomiting and weight loss. Last A1C in the office was:  Lab Results  Component Value Date   HGBA1C 5.5 06/16/2016    Patient is on Vitamin D supplement.   Lab Results  Component Value Date   VD25OH 43 05/02/2016     She is on thyroid medication. Her medication was not changed last visit.   Lab Results  Component Value Date   TSH 1.89 09/19/2016  .  BMI is Body mass index is 38.24 kg/m., she is working on diet and exercise. She has had some weight gain with abiligy, on pristiq and still following with Dr. Rocky Crafts Readings from Last 3 Encounters:  03/08/17 202 lb 6.4 oz (91.8 kg)  09/19/16 191 lb 3.2 oz (86.7 kg)  08/01/16 189 lb (85.7 kg)    Current Medications:  Current Outpatient Prescriptions on File Prior to Visit  Medication Sig Dispense Refill  . ARIPiprazole ([name]) 10 MG tablet  Take 1 tablet (10 mg total) by mouth daily. 30 tablet 2  . Chlorphen-PE-Acetaminophen 4-10-325 MG TABS Take 1 tablet by mouth every 6 (six) hours as needed. 10 tablet 0  . Cholecalciferol (VITAMIN D) 2000 UNITS CAPS Take 8,000 Units by mouth daily.     Marland Kitchen desvenlafaxine (PRISTIQ) 50 MG 24 hr tablet Take 1 tablet (50 mg total) by mouth 2 (two) times daily. 60 tablet 2  . Ferrous Sulfate  (IRON) 325 (65 Fe) MG TABS Take by mouth 2 (two) times daily.    Marland Kitchen ibuprofen (ADVIL,MOTRIN) 200 MG tablet Take 200 mg by mouth every 6 (six) hours as needed for pain. Takes 4 tablets (800 mg) every 4-6 hrs prn    . magnesium 30 MG tablet Take 250 mg by mouth 1 day or 1 dose.    . pravastatin (PRAVACHOL) 40 MG tablet TAKE 1 TABLET BY MOUTH DAILY 90 tablet 1  . SYNTHROID 150 MCG tablet TAKE 1 TABLET BY MOUTH EVERY DAY BEFORE BREAKFAST 90 tablet 1   No current facility-administered medications on file prior to visit.     Health Maintenance:   Immunization History  Administered Date(s) Administered  . Influenza Split 09/09/2015  . Influenza,inj,quad, With Preservative 09/19/2016  . Influenza-Unspecified 08/05/2013  . Td 11/21/1997  . Tdap 01/20/2011    Tetanus: 2012 Flu vaccine:2016 Pap: 2016  Colonoscopy: 2012 Last Dental Exam:  Twice yearly Last Eye Exam:  Dr. Lorin Picket, has not been in several years  Patient Care Team: Lucky Cowboy, MD as PCP - General (Internal Medicine) Fredrich Birks, OD as Referring Physician (Optometry) Sherian Rein, MD as Consulting Physician (Obstetrics and Gynecology) Dairl Ponder, MD as Consulting Physician (Orthopedic Surgery) Elvina Sidle, MD (Neurology) Venancio Poisson, MD as Consulting Physician (Dermatology) Pollyann Savoy, MD as Consulting Physician (Rheumatology) Cleotis Nipper, MD as Consulting Physician (Psychiatry)   Medical History:  Past Medical History:  Diagnosis Date  . Anemia   . Anxiety   . Constipation   . Depression   . Fibromyalgia   . GERD (gastroesophageal reflux disease)   . Hemorrhoids   . HSV-1 infection   . Hypertension   . Hypothyroidism   . Other abnormal glucose   . Preeclampsia    Allergies Allergies  Allergen Reactions  . Lamictal [Lamotrigine] Rash  . Doxycycline Other (See Comments)    GI UPSET  . Effexor [Venlafaxine] Other (See Comments)    DYSPHORIA  . Prednisone Other (See Comments)     FLUSHED  . Savella [Milnacipran Hcl] Other (See Comments)    NO RELIEF    SURGICAL HISTORY She  has a past surgical history that includes Cholecystectomy (2006); Tonsillectomy; and Wisdom tooth extraction. FAMILY HISTORY Her family history includes Alcohol abuse in her brother; Bipolar disorder in her brother and mother; Cancer in her maternal grandmother; Cervical cancer in her mother; Colon polyps in her mother; Diabetes in her cousin, maternal aunt, maternal grandmother, and mother; Heart disease in her maternal aunt and maternal grandmother; Hyperlipidemia in her maternal grandmother; Hypertension in her maternal grandmother; Hypothyroidism in her daughter, maternal grandmother, and mother; Kidney disease in her brother; Lung cancer in her maternal grandfather. SOCIAL HISTORY She  reports that she has never smoked. She has never used smokeless tobacco. She reports that she does not drink alcohol or use drugs.  Review of Systems: Review of Systems  Constitutional: Positive for malaise/fatigue. Negative for chills and fever.  Respiratory: Negative for cough, shortness of breath and wheezing.   Cardiovascular: Negative  for chest pain, palpitations and leg swelling.  Gastrointestinal: Negative for abdominal pain, blood in stool, constipation, diarrhea, heartburn and melena.  Genitourinary: Negative.   Skin: Negative.   Neurological: Positive for dizziness and headaches. Negative for tingling, tremors, sensory change, speech change, focal weakness, seizures and loss of consciousness.  Psychiatric/Behavioral: Positive for depression. The patient is nervous/anxious. The patient does not have insomnia.     Physical Exam: Estimated body mass index is 38.24 kg/m as calculated from the following:   Height as of this encounter:  (1.549 m).   Weight as of this encounter: 202 lb 6.4 oz (91.8 kg). BP 118/74   Pulse 97   Temp 98.1 F (36.7 C)   Resp 16   Ht  (1.549 m)   Wt 202 lb  6.4 oz (91.8 kg)   LMP 03/07/2017   SpO2 99%   BMI 38.24 kg/m   General Appearance: Well nourished well developed, in no apparent distress.  Eyes: PERRLA, EOMs, conjunctiva no swelling or erythema ENT/Mouth: Ear canals normal without obstruction, swelling, erythema, or discharge.  TMs normal bilaterally with no erythema, bulging, retraction, or loss of landmark.  Oropharynx moist and clear with no exudate, erythema, or swelling. Neck: Supple, thyroid normal. No bruits.  No cervical adenopathy Respiratory: Respiratory effort normal, Breath sounds clear A&P without wheeze, rhonchi, rales.   Cardio: RRR without murmurs, rubs or gallops. Brisk peripheral pulses without edema.  Chest: symmetric, with normal excursions Abdomen: Soft, nontender, no guarding, rebound, hernias, masses, or organomegaly.  Lymphatics: Non tender without lymphadenopathy.   Musculoskeletal: Full ROM all peripheral extremities,5/5 strength, and normal gait.  Skin: Warm, dry without rashes, lesions, ecchymosis. Neuro: Awake and oriented X 3, Cranial nerves intact, reflexes equal bilaterally. Normal muscle tone, no cerebellar symptoms. Sensation intact.  Psych:  Flat and depressed affect, Insight and Judgment appropriate.   EKG: WNL no changes.  Over 40 minutes of exam, counseling, chart review and critical decision making was performed  Quentin Mulling 3:19 PM [facility]

## 2017-03-08 ENCOUNTER — Ambulatory Visit (INDEPENDENT_AMBULATORY_CARE_PROVIDER_SITE_OTHER): Payer: BC Managed Care – PPO | Admitting: Physician Assistant

## 2017-03-08 ENCOUNTER — Encounter: Payer: Self-pay | Admitting: Physician Assistant

## 2017-03-08 ENCOUNTER — Encounter: Payer: Self-pay | Admitting: Internal Medicine

## 2017-03-08 VITALS — BP 118/74 | HR 97 | Temp 98.1°F | Resp 16 | Ht 61.0 in | Wt 202.4 lb

## 2017-03-08 DIAGNOSIS — Z136 Encounter for screening for cardiovascular disorders: Secondary | ICD-10-CM

## 2017-03-08 DIAGNOSIS — I10 Essential (primary) hypertension: Secondary | ICD-10-CM

## 2017-03-08 DIAGNOSIS — F3341 Major depressive disorder, recurrent, in partial remission: Secondary | ICD-10-CM

## 2017-03-08 DIAGNOSIS — Z0001 Encounter for general adult medical examination with abnormal findings: Secondary | ICD-10-CM

## 2017-03-08 DIAGNOSIS — R7303 Prediabetes: Secondary | ICD-10-CM | POA: Diagnosis not present

## 2017-03-08 DIAGNOSIS — G8220 Paraplegia, unspecified: Secondary | ICD-10-CM

## 2017-03-08 DIAGNOSIS — R42 Dizziness and giddiness: Secondary | ICD-10-CM | POA: Insufficient documentation

## 2017-03-08 DIAGNOSIS — R6889 Other general symptoms and signs: Secondary | ICD-10-CM

## 2017-03-08 DIAGNOSIS — E785 Hyperlipidemia, unspecified: Secondary | ICD-10-CM

## 2017-03-08 DIAGNOSIS — Z79899 Other long term (current) drug therapy: Secondary | ICD-10-CM

## 2017-03-08 DIAGNOSIS — E559 Vitamin D deficiency, unspecified: Secondary | ICD-10-CM

## 2017-03-08 DIAGNOSIS — G4482 Headache associated with sexual activity: Secondary | ICD-10-CM | POA: Insufficient documentation

## 2017-03-08 DIAGNOSIS — E039 Hypothyroidism, unspecified: Secondary | ICD-10-CM | POA: Diagnosis not present

## 2017-03-08 DIAGNOSIS — Z6838 Body mass index (BMI) 38.0-38.9, adult: Secondary | ICD-10-CM

## 2017-03-08 LAB — LIPID PANEL
Cholesterol: 170 mg/dL (ref <200)
HDL: 44 mg/dL — ABNORMAL LOW (ref >50)
LDL CALC: 92 mg/dL (ref <100)
Total CHOL/HDL Ratio: 3.9 Ratio (ref <5.0)
Triglycerides: 169 mg/dL — ABNORMAL HIGH (ref <150)
VLDL: 34 mg/dL — AB (ref <30)

## 2017-03-08 LAB — HEPATIC FUNCTION PANEL
ALT: 51 U/L — AB (ref 6–29)
AST: 55 U/L — ABNORMAL HIGH (ref 10–30)
Albumin: 4.4 g/dL (ref 3.6–5.1)
Alkaline Phosphatase: 57 U/L (ref 33–115)
Bilirubin, Direct: 0.1 mg/dL (ref <=0.2)
Indirect Bilirubin: 0.5 mg/dL (ref 0.2–1.2)
TOTAL PROTEIN: 7 g/dL (ref 6.1–8.1)
Total Bilirubin: 0.6 mg/dL (ref 0.2–1.2)

## 2017-03-08 LAB — CBC WITH DIFFERENTIAL/PLATELET
BASOS ABS: 0 {cells}/uL (ref 0–200)
Basophils Relative: 0 %
EOS ABS: 81 {cells}/uL (ref 15–500)
Eosinophils Relative: 1 %
HEMATOCRIT: 40.7 % (ref 35.0–45.0)
Hemoglobin: 13.5 g/dL (ref 11.7–15.5)
LYMPHS PCT: 33 %
Lymphs Abs: 2673 cells/uL (ref 850–3900)
MCH: 29.3 pg (ref 27.0–33.0)
MCHC: 33.2 g/dL (ref 32.0–36.0)
MCV: 88.5 fL (ref 80.0–100.0)
MONO ABS: 567 {cells}/uL (ref 200–950)
MPV: 10.9 fL (ref 7.5–12.5)
Monocytes Relative: 7 %
NEUTROS PCT: 59 %
Neutro Abs: 4779 cells/uL (ref 1500–7800)
Platelets: 166 10*3/uL (ref 140–400)
RBC: 4.6 MIL/uL (ref 3.80–5.10)
RDW: 13.8 % (ref 11.0–15.0)
WBC: 8.1 10*3/uL (ref 3.8–10.8)

## 2017-03-08 LAB — BASIC METABOLIC PANEL WITH GFR
BUN: 7 mg/dL (ref 7–25)
CALCIUM: 9.2 mg/dL (ref 8.6–10.2)
CO2: 24 mmol/L (ref 20–31)
Chloride: 104 mmol/L (ref 98–110)
Creat: 0.72 mg/dL (ref 0.50–1.10)
GFR, Est Non African American: >89 mL/min (ref >=60)
GLUCOSE: 90 mg/dL (ref 65–99)
Potassium: 4.1 mmol/L (ref 3.5–5.3)
Sodium: 139 mmol/L (ref 135–146)

## 2017-03-08 MED ORDER — PHENTERMINE HCL 37.5 MG PO TABS: 37.5000 mg | ORAL_TABLET | Freq: Every day | ORAL | 2 refills | Status: DC

## 2017-03-08 NOTE — Patient Instructions (Signed)
Increase water, goal is 80-100 oz a day Bring breakfast to school/work- try to get some protein with it  We want weight loss that will last so you should lose 1-2 pounds a week.  THAT IS IT! Please pick THREE things a month to change. Once it is a habit check off the item. Then pick another three items off the list to become habits.  If you are already doing a habit on the list GREAT!  Cross that item off! o Don't drink your calories. Ie, alcohol, soda, fruit juice, and sweet tea.  o Drink more water. Drink a glass when you feel hungry or before each meal.  o Eat breakfast - Complex carb and protein (likeDannon light and fit yogurt, oatmeal, fruit, eggs, [place] bacon). o Measure your cereal.  Eat no more than one cup a day. (ie [place]) o Eat an apple a day. o Add a vegetable a day. o Try a new vegetable a month. o Use Pam! Stop using oil or butter to cook. o Don't finish your plate or use smaller plates. o Share your dessert. o Eat sugar free Jello for dessert or frozen grapes. o Don't eat 2-3 hours before bed. o Switch to whole wheat bread, pasta, and brown rice. o Make healthier choices when you eat out. No fries! o Pick baked chicken, NOT fried. o Don't forget to SLOW DOWN when you eat. It is not going anywhere.  o Take the stairs. o Park far away in the parking lot o [employer] (or weights) for 10 minutes while watching TV. o Walk at work for 10 minutes during break. o Walk outside 1 time a week with your friend, kids, dog, or significant other. o Start a walking group at church. o Walk the mall as much as you can tolerate.  o Keep a food diary. o Weigh yourself daily. o Walk for 15 minutes 3 days per week. o Cook at home more often and eat out less.  If life happens and you go back to old habits, it is okay.  Just start over. You can do it!   If you experience chest pain, get short of breath, or tired during the exercise, please stop immediately and inform your doctor.    Phentermine  While taking the medication we may ask that you come into the office once a month or once every 2-3 months to monitor your weight, blood pressure, and heart rate. In addition we can help answer your questions about diet, exercise, and help you every step of the way with your weight loss journey. Sometime it is helpful if you bring in a food diary or use an app on your phone such as myfitnesspal to record your calorie intake, especially in the beginning.   You can start out on 1/3 to 1/2 a pill in the morning and if you are tolerating it well you can increase to one pill daily. I also have some patients that take 1/3 or 1/2 at lunch to help prevent night time eating.  This medication is cheapest CASH pay at [facility] OR [employer] OR [facility] is 14-17 dollars and you do NOT need a membership to get meds from there.    What is this medicine? PHENTERMINE (FEN ter meen) decreases your appetite. This medicine is intended to be used in addition to a healthy reduced calorie diet and exercise. The best results are achieved this way. This medicine is only indicated for short-term use. Eventually your weight  loss may level out and the medication will no longer be needed.   How should I use this medicine? Take this medicine by mouth. Follow the directions on the prescription label. The tablets should stay in the bottle until immediately before you take your dose. Take your doses at regular intervals. Do not take your medicine more often than directed.  Overdosage: If you think you have taken too much of this medicine contact a poison control center or emergency room at once. NOTE: This medicine is only for you. Do not share this medicine with others.  What if I miss a dose? If you miss a dose, take it as soon as you can. If it is almost time for your next dose, take only that dose. Do not take double or extra doses. Do not increase or in any way change your dose without consulting your  doctor.  What should I watch for while using this medicine? Notify your physician immediately if you become short of breath while doing your normal activities. Do not take this medicine within 6 hours of bedtime. It can keep you from getting to sleep. Avoid drinks that contain caffeine and try to stick to a regular bedtime every night. Do not stand or sit up quickly, especially if you are an older patient. This reduces the risk of dizzy or fainting spells. Avoid alcoholic drinks.  What side effects may I notice from receiving this medicine? Side effects that you should report to your doctor or health care professional as soon as possible: -chest pain, palpitations -depression or severe changes in mood -increased blood pressure -irritability -nervousness or restlessness -severe dizziness -shortness of breath -problems urinating -unusual swelling of the legs -vomiting  Side effects that usually do not require medical attention (report to your doctor or health care professional if they continue or are bothersome): -blurred vision or other eye problems -changes in sexual ability or desire -constipation or diarrhea -difficulty sleeping -dry mouth or unpleasant taste -headache -nausea This list may not describe all possible side effects. Call your doctor for medical advice about side effects. You may report side effects to FDA at 1-800-FDA-1088.   Simple math prevails.    1st - exercise does not produce significant weight loss - at best one converts fat into muscle , "bulks up", loses inches, but usually stays "weight neutral"     2nd - think of your body weightas a check book: If you eat more calories than you burn up - you save money or gain weight .... Or if you spend more money than you put in the check book, ie burn up more calories than you eat, then you lose weight     3rd - if you walk or run 1 mile, you burn up 100 calories - you have to burn up 3,500 calories to lose 1 pound,  ie you have to walk/run 35 miles to lose 1 measly pound. So if you want to lose 10 #, then you have to walk/run 350 miles, so.... clearly exercise is not the solution.     4. So if you consume 1,500 calories, then you have to burn up the equivalent of 15 miles to stay weight neutral - It also stands to reason that if you consume 1,500 cal/day and don't lose weight, then you must be burning up about 1,500 cals/day to stay weight neutral.     5. If you really want to lose weight, you must cut your calorie intake 300 calories /day  and at that rate you should lose about 1 # every 3 days.   6. Please purchase Dr Francis Dowse Fuhrman's book(s) "The End of Dieting" & "Eat to Live" . It has some great concepts and recipes.

## 2017-03-09 LAB — TSH: TSH: 0.93 mIU/L

## 2017-03-09 LAB — MAGNESIUM: MAGNESIUM: 2.1 mg/dL (ref 1.5–2.5)

## 2017-03-09 LAB — HEMOGLOBIN A1C
HEMOGLOBIN A1C: 5.5 % (ref <5.7)
MEAN PLASMA GLUCOSE: 111 mg/dL

## 2017-03-09 LAB — VITAMIN D 25 HYDROXY (VIT D DEFICIENCY, FRACTURES): VIT D 25 HYDROXY: 56 ng/mL (ref 30–100)

## 2017-03-09 NOTE — Addendum Note (Signed)
Addended by: Quentin Mulling R on: 03/09/2017 11:23 AM   Modules accepted: Orders

## 2017-03-24 ENCOUNTER — Other Ambulatory Visit: Payer: Self-pay

## 2017-04-04 ENCOUNTER — Other Ambulatory Visit (HOSPITAL_COMMUNITY): Payer: Self-pay | Admitting: Psychiatry

## 2017-04-04 DIAGNOSIS — F331 Major depressive disorder, recurrent, moderate: Secondary | ICD-10-CM

## 2017-04-05 ENCOUNTER — Other Ambulatory Visit (HOSPITAL_COMMUNITY): Payer: Self-pay | Admitting: Psychiatry

## 2017-04-06 ENCOUNTER — Encounter (HOSPITAL_COMMUNITY): Payer: Self-pay | Admitting: Psychiatry

## 2017-04-06 ENCOUNTER — Ambulatory Visit (INDEPENDENT_AMBULATORY_CARE_PROVIDER_SITE_OTHER): Payer: BC Managed Care – PPO | Admitting: Psychiatry

## 2017-04-06 DIAGNOSIS — Z818 Family history of other mental and behavioral disorders: Secondary | ICD-10-CM | POA: Diagnosis not present

## 2017-04-06 DIAGNOSIS — Z79899 Other long term (current) drug therapy: Secondary | ICD-10-CM

## 2017-04-06 DIAGNOSIS — Z811 Family history of alcohol abuse and dependence: Secondary | ICD-10-CM

## 2017-04-06 DIAGNOSIS — F331 Major depressive disorder, recurrent, moderate: Secondary | ICD-10-CM

## 2017-04-06 MED ORDER — ARIPIPRAZOLE 10 MG PO TABS: 10.0000 mg | ORAL_TABLET | Freq: Every day | ORAL | 2 refills | Status: DC

## 2017-04-06 MED ORDER — DESVENLAFAXINE SUCCINATE ER 50 MG PO TB24: 50.0000 mg | ORAL_TABLET | Freq: Two times a day (BID) | ORAL | 2 refills | Status: DC

## 2017-04-06 NOTE — Progress Notes (Signed)
BH MD/PA/NP OP Progress Note  04/06/2017 4:29 PM Lauren [name]  MRN:  [id number]  Chief Complaint:  Subjective:  I am doing better on the medication.  I'm sleeping good.  HPI: Lauren ChessmanMandy came for her follow-up appointment.  She is taking her medication as prescribed.  She really liked Pristiq which is helping her depression.  She still sometimes have lack of motivation but she denies any crying spells, suicidal thoughts or homicidal thought.  She does not feel that she need therapists since she is improved from the past.  She denies any hallucination, paranoia or any self abusive behavior.  She denies any drinking or using any illegal substances.  She has no tremors or shakes.  She has plan to go to [place] in summer vacation.  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  Patient lives with her husband and 2 daughter.  Patient recently had blood work and her labs are normal.  Visit Diagnosis:    ICD-9-CM ICD-10-CM   1. Major depressive disorder, recurrent episode, moderate (HCC) 296.32 F33.1 desvenlafaxine (PRISTIQ) 50 MG 24 hr tablet     ARIPiprazole ([name]) 10 MG tablet    Past Psychiatric History: Reviewed. She is taking antidepressant from primary care physiciansince 2002 when she was in college. At that time she remembered feeling isolated, withdrawn, having crying spells and decreased energy. She had tried Effexor and Prozac with good response by her primary care physician. She also remembered taking Depakote, Zoloft, Celexa, Cymbalta, Wellbutrin and recently Brintellix and Prozac. Patient was recently given Lamictal with good response until she developed rash.Patient denies any history of abuse in the past.   Past Medical History:  Past Medical History:  Diagnosis Date  . Anemia   . Anxiety   . Constipation   . Depression   . Fibromyalgia   . GERD (gastroesophageal reflux disease)   . Hemorrhoids   . HSV-1 infection   . Hypertension   .  Hypothyroidism   . Other abnormal glucose   . Preeclampsia     Past Surgical History:  Procedure Laterality Date  . CHOLECYSTECTOMY  2006  . TONSILLECTOMY    . WISDOM TOOTH EXTRACTION      Family Psychiatric History: Reviewed.  Family History:  Family History  Problem Relation Age of Onset  . Heart disease Maternal Grandmother   . Diabetes Maternal Grandmother   . Cancer Maternal Grandmother        SKIN  . Hyperlipidemia Maternal Grandmother   . Hypertension Maternal Grandmother   . Hypothyroidism Maternal Grandmother   . Hypothyroidism Mother   . Cervical cancer Mother   . Bipolar disorder Mother   . Diabetes Mother   . Colon polyps Mother   . Bipolar disorder Brother   . Kidney disease Brother   . Alcohol abuse Brother   . Lung cancer Maternal Grandfather   . Heart disease Maternal Aunt   . Diabetes Maternal Aunt   . Diabetes Cousin   . Colon cancer Neg Hx   . Hypothyroidism Daughter     Social History:  Social History   Social History  . Marital status: Married    Spouse name: N/A  . Number of children: 2  . Years of education: N/A   Occupational History  . TEACHER [employer]   Social History Main Topics  . Smoking status: Never Smoker  . Smokeless tobacco: Never Used  . Alcohol use No     Comment:  occ  . Drug use: No  . Sexual activity: Yes    Partners: Male    Birth control/ protection: None     Comment: husband has had a vasectomy   Other Topics Concern  . None   Social History Narrative   4 caffeine drinks daily     Allergies:  Allergies  Allergen Reactions  . Lamictal [Lamotrigine] Rash  . Doxycycline Other (See Comments)    GI UPSET  . Effexor [Venlafaxine] Other (See Comments)    DYSPHORIA  . Prednisone Other (See Comments)    FLUSHED  . Savella [Milnacipran Hcl] Other (See Comments)    NO RELIEF    Metabolic Disorder Labs: Lab Results  Component Value Date   HGBA1C 5.5 03/08/2017   MPG 111 03/08/2017   MPG  111 06/16/2016   No results found for: PROLACTIN Lab Results  Component Value Date   CHOL 170 03/08/2017   TRIG 169 (H) 03/08/2017   HDL 44 (L) 03/08/2017   CHOLHDL 3.9 03/08/2017   VLDL 34 (H) 03/08/2017   LDLCALC 92 03/08/2017   LDLCALC 94 06/16/2016     Current Medications: Current Outpatient Prescriptions  Medication Sig Dispense Refill  . ARIPiprazole ([name]) 10 MG tablet Take 1 tablet (10 mg total) by mouth daily. 30 tablet 2  . Cholecalciferol (VITAMIN D) 2000 UNITS CAPS Take 8,000 Units by mouth daily.     Marland Kitchen desvenlafaxine (PRISTIQ) 50 MG 24 hr tablet Take 1 tablet (50 mg total) by mouth 2 (two) times daily. 60 tablet 2  . Ferrous Sulfate (IRON) 325 (65 Fe) MG TABS Take by mouth 2 (two) times daily.    Marland Kitchen ibuprofen (ADVIL,MOTRIN) 200 MG tablet Take 200 mg by mouth every 6 (six) hours as needed for pain. Takes 4 tablets (800 mg) every 4-6 hrs prn    . magnesium 30 MG tablet Take 250 mg by mouth 1 day or 1 dose.    . phentermine (ADIPEX-P) 37.5 MG tablet Take 1 tablet (37.5 mg total) by mouth daily before breakfast. 30 tablet 2  . pravastatin (PRAVACHOL) 40 MG tablet TAKE 1 TABLET BY MOUTH DAILY 90 tablet 1  . SYNTHROID 150 MCG tablet TAKE 1 TABLET BY MOUTH EVERY DAY BEFORE BREAKFAST 90 tablet 1  . Chlorphen-PE-Acetaminophen 4-10-325 MG TABS Take 1 tablet by mouth every 6 (six) hours as needed. (Patient not taking: Reported on 04/06/2017) 10 tablet 0   No current facility-administered medications for this visit.     Neurologic: Headache: No Seizure: No Paresthesias: No  Musculoskeletal: Strength & Muscle Tone: within normal limits Gait & Station: normal Patient leans: N/A  Psychiatric Specialty Exam: ROS  Blood pressure 118/80, pulse 89, height 5\' 1"  (1.549 m), weight 197 lb (89.4 kg), last menstrual period 03/07/2017, SpO2 99 %.Body mass index is 37.22 kg/m.  General Appearance: Casual  Eye Contact:  Good  Speech:  Clear and Coherent  Volume:  Normal  Mood:   Euthymic  Affect:  Congruent  Thought Process:  Goal Directed  Orientation:  Full (Time, Place, and Person)  Thought Content: WDL and Logical   Suicidal Thoughts:  No  Homicidal Thoughts:  No  Memory:  Immediate;   Good Recent;   Good Remote;   Good  Judgement:  Good  Insight:  Good  Psychomotor Activity:  Normal  Concentration:  Concentration: Good and Attention Span: Good  Recall:  Good  Fund of Knowledge: Good  Language: Good  Akathisia:  No  Handed:  Right  AIMS (if indicated):  0  Assets:  Communication Skills Desire for Improvement Housing Physical Health Resilience Social Support Talents/Skills  ADL's:  Intact  Cognition: WNL  Sleep:  ok    Assessment: Major depressive disorder, recurrent.  Plan: Patient is a stable on her current psychiatric medication.  I will continue Pristiq 50 mg twice a day and [name] 10 mg daily.  Recently she had blood work and her labs were normal.  Recommended to call us back if she has any question, concern or if she feels worsening of the symptom.  Follow-up in 3 months.  Atticus Wedin T., MD 04/06/2017, 4:29 PM

## 2017-06-01 ENCOUNTER — Ambulatory Visit
Admission: RE | Admit: 2017-06-01 | Discharge: 2017-06-01 | Disposition: A | Discharge status: Home / Self Care | Payer: BC Managed Care – PPO | Source: Ambulatory Visit | Attending: Physician Assistant | Admitting: Physician Assistant

## 2017-06-01 ENCOUNTER — Ambulatory Visit (INDEPENDENT_AMBULATORY_CARE_PROVIDER_SITE_OTHER): Payer: BC Managed Care – PPO | Admitting: Physician Assistant

## 2017-06-01 ENCOUNTER — Encounter: Payer: Self-pay | Admitting: Physician Assistant

## 2017-06-01 ENCOUNTER — Other Ambulatory Visit: Payer: Self-pay | Admitting: Physician Assistant

## 2017-06-01 VITALS — BP 130/80 | HR 89 | Temp 97.9°F | Resp 14 | Ht 61.0 in | Wt 197.2 lb

## 2017-06-01 DIAGNOSIS — R112 Nausea with vomiting, unspecified: Secondary | ICD-10-CM

## 2017-06-01 DIAGNOSIS — R1031 Right lower quadrant pain: Secondary | ICD-10-CM

## 2017-06-01 DIAGNOSIS — R197 Diarrhea, unspecified: Secondary | ICD-10-CM | POA: Diagnosis not present

## 2017-06-01 LAB — CBC WITH DIFFERENTIAL/PLATELET
BASOS ABS: 0 {cells}/uL (ref 0–200)
Basophils Relative: 0 %
EOS ABS: 122 {cells}/uL (ref 15–500)
Eosinophils Relative: 2 %
HCT: 41.2 % (ref 35.0–45.0)
Hemoglobin: 14 g/dL (ref 11.7–15.5)
LYMPHS PCT: 37 %
Lymphs Abs: 2257 cells/uL (ref 850–3900)
MCH: 30 pg (ref 27.0–33.0)
MCHC: 34 g/dL (ref 32.0–36.0)
MCV: 88.2 fL (ref 80.0–100.0)
MONOS PCT: 6 %
MPV: 10.8 fL (ref 7.5–12.5)
Monocytes Absolute: 366 cells/uL (ref 200–950)
NEUTROS ABS: 3355 {cells}/uL (ref 1500–7800)
NEUTROS PCT: 55 %
PLATELETS: 183 10*3/uL (ref 140–400)
RBC: 4.67 MIL/uL (ref 3.80–5.10)
RDW: 13.3 % (ref 11.0–15.0)
WBC: 6.1 10*3/uL (ref 3.8–10.8)

## 2017-06-01 LAB — BASIC METABOLIC PANEL WITH GFR
BUN: 8 mg/dL (ref 7–25)
CALCIUM: 9.2 mg/dL (ref 8.6–10.2)
CO2: 27 mmol/L (ref 20–31)
CREATININE: 0.74 mg/dL (ref 0.50–1.10)
Chloride: 102 mmol/L (ref 98–110)
GFR, Est African American: >89 mL/min (ref >=60)
GFR, Est Non African American: >89 mL/min (ref >=60)
Glucose, Bld: 109 mg/dL — ABNORMAL HIGH (ref 65–99)
Potassium: 4 mmol/L (ref 3.5–5.3)
SODIUM: 136 mmol/L (ref 135–146)

## 2017-06-01 LAB — HEPATIC FUNCTION PANEL
ALBUMIN: 4.4 g/dL (ref 3.6–5.1)
ALT: 52 U/L — AB (ref 6–29)
AST: 53 U/L — ABNORMAL HIGH (ref 10–30)
Alkaline Phosphatase: 66 U/L (ref 33–115)
BILIRUBIN DIRECT: 0.1 mg/dL (ref <=0.2)
Indirect Bilirubin: 0.3 mg/dL (ref 0.2–1.2)
Total Bilirubin: 0.4 mg/dL (ref 0.2–1.2)
Total Protein: 6.9 g/dL (ref 6.1–8.1)

## 2017-06-01 LAB — AMYLASE: AMYLASE: 49 U/L (ref 21–101)

## 2017-06-01 MED ORDER — METRONIDAZOLE 500 MG PO TABS: 500.0000 mg | ORAL_TABLET | Freq: Three times a day (TID) | ORAL | 0 refills | Status: AC

## 2017-06-01 MED ORDER — SUCRALFATE 1 G PO TABS: 1.0000 g | ORAL_TABLET | Freq: Three times a day (TID) | ORAL | 1 refills | Status: DC | PRN

## 2017-06-01 MED ORDER — PROMETHAZINE HCL 25 MG PO TABS: 25.0000 mg | ORAL_TABLET | Freq: Four times a day (QID) | ORAL | 3 refills | Status: DC | PRN

## 2017-06-01 MED ORDER — ONDANSETRON HCL 8 MG PO TABS: ORAL_TABLET | ORAL | 0 refills | Status: DC

## 2017-06-01 MED ORDER — CIPROFLOXACIN HCL 500 MG PO TABS
500.0000 mg | ORAL_TABLET | Freq: Two times a day (BID) | ORAL | 0 refills | Status: AC
Start: 2017-06-01 — End: 2017-06-08

## 2017-06-01 MED ORDER — IOPAMIDOL (ISOVUE-300) INJECTION 61%
100.0000 mL | Freq: Once | INTRAVENOUS | Status: AC | PRN
  Administered 2017-06-01: 100 mL via INTRAVENOUS

## 2017-06-01 NOTE — Patient Instructions (Signed)
Abdominal Pain, Adult Abdominal pain can be caused by many things. Often, abdominal pain is not serious and it gets better with no treatment or by being treated at home. However, sometimes abdominal pain is serious. Your health care provider will do a medical history and a physical exam to try to determine the cause of your abdominal pain. Follow these instructions at home:  Take over-the-counter and prescription medicines only as told by your health care provider. Do not take a laxative unless told by your health care provider.  Drink enough fluid to keep your urine clear or pale yellow.  Watch your condition for any changes.  Keep all follow-up visits as told by your health care provider. This is important. Contact a health care provider if:  Your abdominal pain changes or gets worse.  You are not hungry or you lose weight without trying.  You are constipated or have diarrhea for more than 2-3 days.  You have pain when you urinate or have a bowel movement.  Your abdominal pain wakes you up at night.  Your pain gets worse with meals, after eating, or with certain foods.  You are throwing up and cannot keep anything down.  You have a fever. Get help right away if:  Your pain does not go away as soon as your health care provider told you to expect.  You cannot stop throwing up.  Your pain is only in areas of the abdomen, such as the right side or the left lower portion of the abdomen.  You have bloody or black stools, or stools that look like tar.  You have severe pain, cramping, or bloating in your abdomen.  You have signs of dehydration, such as: ? Dark urine, very little urine, or no urine. ? Cracked lips. ? Dry mouth. ? Sunken eyes. ? Sleepiness. ? Weakness. This information is not intended to replace advice given to you by your health care provider. Make sure you discuss any questions you have with your health care provider. Document Released: 08/17/2005 Document  Revised: 05/27/2016 Document Reviewed: 04/20/2016 Elsevier Interactive Patient Education  2017 [employer]   Diverticulitis Diverticulitis is inflammation or infection of small pouches in your colon that form when you have a condition called diverticulosis. The pouches in your colon are called diverticula. Your colon, or large intestine, is where water is absorbed and stool is formed. Complications of diverticulitis can include:  Bleeding.  Severe infection.  Severe pain.  Perforation of your colon.  Obstruction of your colon.  What are the causes? Diverticulitis is caused by bacteria. Diverticulitis happens when stool becomes trapped in diverticula. This allows bacteria to grow in the diverticula, which can lead to inflammation and infection. What increases the risk? People with diverticulosis are at risk for diverticulitis. Eating a diet that does not include enough fiber from fruits and vegetables may make diverticulitis more likely to develop. What are the signs or symptoms? Symptoms of diverticulitis may include:  Abdominal pain and tenderness. The pain is normally located on the left side of the abdomen, but may occur in other areas.  Fever and chills.  Bloating.  Cramping.  Nausea.  Vomiting.  Constipation.  Diarrhea.  Blood in your stool.  How is this diagnosed? Your health care provider will ask you about your medical history and do a physical exam. You may need to have tests done because many medical conditions can cause the same symptoms as diverticulitis. Tests may include:  Blood tests.  Urine tests.  Imaging tests of the abdomen, including X-rays and CT scans.  When your condition is under control, your health care provider may recommend that you have a colonoscopy. A colonoscopy can show how severe your diverticula are and whether something else is causing your symptoms. How is this treated? Most cases of diverticulitis are mild and can be  treated at home. Treatment may include:  Taking over-the-counter pain medicines.  Following a clear liquid diet.  Taking antibiotic medicines by mouth for 7-10 days.  More severe cases may be treated at a hospital. Treatment may include:  Not eating or drinking.  Taking prescription pain medicine.  Receiving antibiotic medicines through an IV tube.  Receiving fluids and nutrition through an IV tube.  Surgery.  Follow these instructions at home:  Follow your health care provider's instructions carefully.  Follow a full liquid diet or other diet as directed by your health care provider. After your symptoms improve, your health care provider may tell you to change your diet. He or she may recommend you eat a high-fiber diet. Fruits and vegetables are good sources of fiber. Fiber makes it easier to pass stool.  Take fiber supplements or probiotics as directed by your health care provider.  Only take medicines as directed by your health care provider.  Keep all your follow-up appointments. Contact a health care provider if:  Your pain does not improve.  You have a hard time eating food.  Your bowel movements do not return to normal. Get help right away if:  Your pain becomes worse.  Your symptoms do not get better.  Your symptoms suddenly get worse.  You have a fever.  You have repeated vomiting.  You have bloody or black, tarry stools. This information is not intended to replace advice given to you by your health care provider. Make sure you discuss any questions you have with your health care provider. Document Released: 08/17/2005 Document Revised: 04/14/2016 Document Reviewed: 10/02/2013 Elsevier Interactive Patient Education  2017 [employer].

## 2017-06-01 NOTE — Progress Notes (Signed)
Subjective:    Patient ID: Lauren [name], female    DOB: 1981-10-22, 36 y.o.   MRN: [id number]  HPI 36 y.o. obese WF with history of depression, HTN, history of cholecystectomy.presents with RLQ pain x 2 weeks, not getting any worse.  She has had heart burn, RLQ pain worse with food, with nausea and vomiting x 1 time, feeling bloating and flatulence, has had diarrhea 1 x a day, no black stool or blood in stool. Got on probiotic and zantac x 1 weeks but only takes with heart burn.  Has not been taking NSAIDS recently, has been months. Has increase soda but since this happened, has stopped sodas and spicy foods. No fever or chills.  No ETOH No sick contacts/no travel.  AB [place] 11/2015 normal  BMI is Body mass index is 37.26 kg/m., she is working on diet and exercise. Wt Readings from Last 3 Encounters:  06/01/17 197 lb 3.2 oz (89.4 kg)  03/08/17 202 lb 6.4 oz (91.8 kg)  09/19/16 191 lb 3.2 oz (86.7 kg)    Blood pressure 130/80, pulse 89, temperature 97.9 F (36.6 C), resp. rate 14, height 5\' 1"  (1.549 m), weight 197 lb 3.2 oz (89.4 kg), last menstrual period 05/26/2017, SpO2 98 %.  Medications Current Outpatient Prescriptions on File Prior to Visit  Medication Sig  . ARIPiprazole ([name]) 10 MG tablet Take 1 tablet (10 mg total) by mouth daily.  . Chlorphen-PE-Acetaminophen 4-10-325 MG TABS Take 1 tablet by mouth every 6 (six) hours as needed.  . Cholecalciferol (VITAMIN D) 2000 UNITS CAPS Take 8,000 Units by mouth daily.   Marland Kitchen desvenlafaxine (PRISTIQ) 50 MG 24 hr tablet Take 1 tablet (50 mg total) by mouth 2 (two) times daily.  . Ferrous Sulfate (IRON) 325 (65 Fe) MG TABS Take by mouth 2 (two) times daily.  Marland Kitchen ibuprofen (ADVIL,MOTRIN) 200 MG tablet Take 200 mg by mouth every 6 (six) hours as needed for pain. Takes 4 tablets (800 mg) every 4-6 hrs prn  . magnesium 30 MG tablet Take 250 mg by mouth 1 day or 1 dose.  . phentermine (ADIPEX-P) 37.5 MG tablet Take 1 tablet (37.5 mg total)  by mouth daily before breakfast.  . pravastatin (PRAVACHOL) 40 MG tablet TAKE 1 TABLET BY MOUTH DAILY  . SYNTHROID 150 MCG tablet TAKE 1 TABLET BY MOUTH EVERY DAY BEFORE BREAKFAST   No current facility-administered medications on file prior to visit.     Problem list She has Hypothyroidism; Paralysis, periodic secondary; Depression; Hypertension; Prediabetes; Hyperlipidemia LDL goal <100; Obese; Medication management; Paraparesis (HCC); Headache associated with sexual activity; and Dizziness on her problem list.  Review of Systems  Constitutional: Positive for chills and fatigue. Negative for diaphoresis and fever.  HENT: Negative.   Respiratory: Negative.  Negative for cough.   Cardiovascular: Negative.   Gastrointestinal: Positive for abdominal pain, diarrhea and nausea. Negative for abdominal distention, anal bleeding, blood in stool, constipation, rectal pain and vomiting.  Genitourinary: Negative.   Musculoskeletal: Negative for arthralgias, back pain, gait problem, joint swelling, myalgias, neck pain and neck stiffness.  Skin: Negative.   Neurological: Negative for dizziness and headaches.       Objective:   Physical Exam  Constitutional: She is oriented to person, place, and time. She appears well-developed and well-nourished.  HENT:  Head: Normocephalic and atraumatic.  Right Ear: External ear normal.  Left Ear: External ear normal.  Mouth/Throat: Oropharynx is clear and moist.  Eyes: Pupils are equal, round, and reactive  to light. Conjunctivae and EOM are normal.  Neck: Normal range of motion. Neck supple. No thyromegaly present.  Cardiovascular: Normal rate, regular rhythm and normal heart sounds.  Exam reveals no gallop and no friction rub.   No murmur heard. Pulmonary/Chest: Effort normal and breath sounds normal. No respiratory distress. She has no wheezes.  Abdominal: Soft. She exhibits no shifting dullness, no distension, no abdominal bruit, no pulsatile midline  mass and no mass. Bowel sounds are decreased. There is no hepatosplenomegaly. There is tenderness in the right lower quadrant. There is guarding. There is no rigidity, no rebound, no CVA tenderness, no tenderness at McBurney's point and negative Murphy's sign. No hernia.  Musculoskeletal: Normal range of motion.  Lymphadenopathy:    She has no cervical adenopathy.  Neurological: She is alert and oriented to person, place, and time.  Skin: Skin is warm and dry.  Psychiatric: She has a normal mood and affect.       Assessment & Plan:    Right lower quadrant pain with N/V, diarrhea rarely ? Diverticulitis versus atypical appendicitis, versus viral/gerd, etc Check labs, treat symptoms Will treat cipro/flaygl If worsening symptoms go to ER -     CBC with Differential/Platelet -     BASIC METABOLIC PANEL WITH GFR -     Hepatic function panel -     Amylase -     promethazine (PHENERGAN) 25 MG tablet; Take 1 tablet (25 mg total) by mouth every 6 (six) hours as needed for nausea or vomiting. -     ciprofloxacin (CIPRO) 500 MG tablet; Take 1 tablet (500 mg total) by mouth 2 (two) times daily. -     metroNIDAZOLE (FLAGYL) 500 MG tablet; Take 1 tablet (500 mg total) by mouth 3 (three) times daily. -     CT Abdomen Pelvis W Contrast; Future -     ondansetron (ZOFRAN) 8 MG tablet; 1/2-1 pill as needed every 8 hours for nausea, nonsedating -     sucralfate (CARAFATE) 1 g tablet; Take 1 tablet (1 g total) by mouth 3 (three) times daily with meals as needed.

## 2017-06-03 ENCOUNTER — Other Ambulatory Visit (HOSPITAL_COMMUNITY): Payer: Self-pay | Admitting: Psychiatry

## 2017-06-03 DIAGNOSIS — F331 Major depressive disorder, recurrent, moderate: Secondary | ICD-10-CM

## 2017-06-05 ENCOUNTER — Other Ambulatory Visit (HOSPITAL_COMMUNITY): Payer: Self-pay | Admitting: Psychiatry

## 2017-06-05 ENCOUNTER — Telehealth (HOSPITAL_COMMUNITY): Payer: Self-pay

## 2017-06-05 DIAGNOSIS — F331 Major depressive disorder, recurrent, moderate: Secondary | ICD-10-CM

## 2017-06-05 MED ORDER — DESVENLAFAXINE SUCCINATE ER 100 MG PO TB24: 100.0000 mg | ORAL_TABLET | Freq: Every day | ORAL | 0 refills | Status: DC

## 2017-06-05 MED ORDER — DESVENLAFAXINE SUCCINATE ER 50 MG PO TB24: 50.0000 mg | ORAL_TABLET | Freq: Two times a day (BID) | ORAL | 0 refills | Status: DC

## 2017-06-05 NOTE — Telephone Encounter (Signed)
Medication management - Fax received from [facility] in [place] as patient's insurance will not cover Pristiq 50 mg, 2 pills daily.  Needs a higher dosage order to replace one for twice a day.

## 2017-06-29 ENCOUNTER — Ambulatory Visit: Payer: Self-pay | Admitting: Physician Assistant

## 2017-07-02 ENCOUNTER — Other Ambulatory Visit (HOSPITAL_COMMUNITY): Payer: Self-pay | Admitting: Psychiatry

## 2017-07-02 DIAGNOSIS — F331 Major depressive disorder, recurrent, moderate: Secondary | ICD-10-CM

## 2017-07-04 ENCOUNTER — Encounter (HOSPITAL_COMMUNITY): Payer: Self-pay | Admitting: Psychiatry

## 2017-07-04 ENCOUNTER — Ambulatory Visit (INDEPENDENT_AMBULATORY_CARE_PROVIDER_SITE_OTHER): Payer: BC Managed Care – PPO | Admitting: Psychiatry

## 2017-07-04 DIAGNOSIS — Z818 Family history of other mental and behavioral disorders: Secondary | ICD-10-CM

## 2017-07-04 DIAGNOSIS — Z811 Family history of alcohol abuse and dependence: Secondary | ICD-10-CM

## 2017-07-04 DIAGNOSIS — F331 Major depressive disorder, recurrent, moderate: Secondary | ICD-10-CM | POA: Diagnosis not present

## 2017-07-04 MED ORDER — DESVENLAFAXINE SUCCINATE ER 100 MG PO TB24: 100.0000 mg | ORAL_TABLET | Freq: Every day | ORAL | 2 refills | Status: DC

## 2017-07-04 MED ORDER — ARIPIPRAZOLE 10 MG PO TABS: 10.0000 mg | ORAL_TABLET | Freq: Every day | ORAL | 2 refills | Status: DC

## 2017-07-04 NOTE — Progress Notes (Signed)
BH MD/PA/NP OP Progress Note  07/04/2017 4:27 PM Lauren [name]  MRN:  [id number]  Chief Complaint:  Subjective:  I am doing good.  Sometimes I'm sleeping too much.  HPI: Lauren [name] came for her follow-up appointment.  She is taking medication as prescribed.  She like Pristiq is helping her anxiety and depression.  She denies any crying spells or any suicidal thoughts.  She mentioned this summer was very good and she did not have any depressive thoughts.  She went to [facility] with the family and had a good time.  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  Her only concern is sleeping too much some days.  She denies any hallucination, paranoia, self abusive behavior.  Her energy level is fair.  She denies drinking alcohol or using any illegal substances.  She denies any tremors shakes or any EPS.  Her appetite is okay.  Visit Diagnosis:    ICD-10-CM   1. Major depressive disorder, recurrent episode, moderate (HCC) F33.1 desvenlafaxine (PRISTIQ) 100 MG 24 hr tablet    ARIPiprazole ([name]) 10 MG tablet    Past Psychiatric History: Reviewed. She is taking antidepressant from primary care physiciansince 2002 when she was in college. At that time she remembered feeling isolated, withdrawn, having crying spells and decreased energy. She had tried Effexor and Prozac with good response by her primary care physician. She also remembered taking Depakote, Zoloft, Celexa, Cymbalta, Wellbutrin and recently Brintellixand Prozac. Patient was recently given Lamictal with good response until she developed rash.Patient denies any history of abuse in the past.   Past Medical History:  Past Medical History:  Diagnosis Date  . Anemia   . Anxiety   . Constipation   . Depression   . Fibromyalgia   . GERD (gastroesophageal reflux disease)   . Hemorrhoids   . HSV-1 infection   . Hypertension   . Hypothyroidism   . Other abnormal glucose   . Preeclampsia     Past Surgical History:   Procedure Laterality Date  . CHOLECYSTECTOMY  2006  . TONSILLECTOMY    . WISDOM TOOTH EXTRACTION      Family Psychiatric History: Reviewed.  Family History:  Family History  Problem Relation Age of Onset  . Hypothyroidism Mother   . Cervical cancer Mother   . Bipolar disorder Mother   . Diabetes Mother   . Colon polyps Mother   . Bipolar disorder Brother   . Kidney disease Brother   . Alcohol abuse Brother   . Hypothyroidism Daughter   . Heart disease Maternal Grandmother   . Diabetes Maternal Grandmother   . Cancer Maternal Grandmother        SKIN  . Hyperlipidemia Maternal Grandmother   . Hypertension Maternal Grandmother   . Hypothyroidism Maternal Grandmother   . Lung cancer Maternal Grandfather   . Heart disease Maternal Aunt   . Diabetes Maternal Aunt   . Diabetes Cousin   . Colon cancer Neg Hx     Social History:  Social History   Social History  . Marital status: Married    Spouse name: N/A  . Number of children: 2  . Years of education: N/A   Occupational History  . TEACHER [employer]   Social History Main Topics  . Smoking status: Never Smoker  . Smokeless tobacco: Never Used  . Alcohol use No     Comment: occ  . Drug use: No  . Sexual activity: Yes    Partners:  Male    Birth control/ protection: None     Comment: husband has had a vasectomy   Other Topics Concern  . None   Social History Narrative   4 caffeine drinks daily     Allergies:  Allergies  Allergen Reactions  . Lamictal [Lamotrigine] Rash  . Doxycycline Other (See Comments)    GI UPSET  . Effexor [Venlafaxine] Other (See Comments)    DYSPHORIA  . Prednisone Other (See Comments)    FLUSHED  . Savella [Milnacipran Hcl] Other (See Comments)    NO RELIEF    Metabolic Disorder Labs: Lab Results  Component Value Date   HGBA1C 5.5 03/08/2017   MPG 111 03/08/2017   MPG 111 06/16/2016   No results found for: PROLACTIN Lab Results  Component Value Date    CHOL 170 03/08/2017   TRIG 169 (H) 03/08/2017   HDL 44 (L) 03/08/2017   CHOLHDL 3.9 03/08/2017   VLDL 34 (H) 03/08/2017   LDLCALC 92 03/08/2017   LDLCALC 94 06/16/2016     Current Medications: Current Outpatient Prescriptions  Medication Sig Dispense Refill  . ARIPiprazole ([name]) 10 MG tablet Take 1 tablet (10 mg total) by mouth daily. 30 tablet 2  . Cholecalciferol (VITAMIN D) 2000 UNITS CAPS Take 8,000 Units by mouth daily.     Marland Kitchen desvenlafaxine (PRISTIQ) 100 MG 24 hr tablet Take 1 tablet (100 mg total) by mouth daily. 30 tablet 0  . Ferrous Sulfate (IRON) 325 (65 Fe) MG TABS Take by mouth 2 (two) times daily.    Marland Kitchen ibuprofen (ADVIL,MOTRIN) 200 MG tablet Take 200 mg by mouth every 6 (six) hours as needed for pain. Takes 4 tablets (800 mg) every 4-6 hrs prn    . pravastatin (PRAVACHOL) 40 MG tablet TAKE 1 TABLET BY MOUTH DAILY 90 tablet 1  . SYNTHROID 150 MCG tablet TAKE 1 TABLET BY MOUTH EVERY DAY BEFORE BREAKFAST 90 tablet 1   No current facility-administered medications for this visit.     Neurologic: Headache: No Seizure: No Paresthesias: No  Musculoskeletal: Strength & Muscle Tone: within normal limits Gait & Station: normal Patient leans: N/A  Psychiatric Specialty Exam: ROS  Blood pressure 126/72, pulse 93, height 5\' 1"  (1.549 m), weight 199 lb 3.2 oz (90.4 kg).Body mass index is 37.64 kg/m.  General Appearance: Casual  Eye Contact:  Good  Speech:  Clear and Coherent  Volume:  Normal  Mood:  Euthymic  Affect:  Congruent  Thought Process:  Goal Directed  Orientation:  Full (Time, Place, and Person)  Thought Content: Logical   Suicidal Thoughts:  No  Homicidal Thoughts:  No  Memory:  Immediate;   Good Recent;   Good Remote;   Good  Judgement:  Good  Insight:  Good  Psychomotor Activity:  Normal  Concentration:  Concentration: Good and Attention Span: Good  Recall:  Good  Fund of Knowledge: Good  Language: Good  Akathisia:  No  Handed:  Right  AIMS  (if indicated):  0  Assets:  Communication Skills Desire for Improvement Housing Resilience Social Support Talents/Skills  ADL's:  Intact  Cognition: WNL  Sleep:  Too much    Assessment: Major depressive disorder, recurrent.  Plan: Reassurance given.  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  I recommended to call us back if she continues to have excessive sedation in  the morning.  She does not want to change or reduce her medication for now.  Continue [name] 10 mg daily and Pristiq 100 mg in the morning.  Recommended to call us back if she has any question or any concern.  Follow-up in 3 months.  Haiden Rawlinson T., MD 07/04/2017, 4:27 PM

## 2017-08-02 ENCOUNTER — Other Ambulatory Visit: Payer: Self-pay | Admitting: Internal Medicine

## 2017-08-05 ENCOUNTER — Other Ambulatory Visit: Payer: Self-pay | Admitting: Internal Medicine

## 2017-09-19 ENCOUNTER — Ambulatory Visit (INDEPENDENT_AMBULATORY_CARE_PROVIDER_SITE_OTHER): Payer: BC Managed Care – PPO | Admitting: Psychiatry

## 2017-09-19 ENCOUNTER — Encounter (HOSPITAL_COMMUNITY): Payer: Self-pay | Admitting: Psychiatry

## 2017-09-19 VITALS — BP 130/88 | HR 76 | Ht 61.0 in | Wt 198.4 lb

## 2017-09-19 DIAGNOSIS — Z818 Family history of other mental and behavioral disorders: Secondary | ICD-10-CM

## 2017-09-19 DIAGNOSIS — F419 Anxiety disorder, unspecified: Secondary | ICD-10-CM

## 2017-09-19 DIAGNOSIS — Z566 Other physical and mental strain related to work: Secondary | ICD-10-CM | POA: Diagnosis not present

## 2017-09-19 DIAGNOSIS — R450 Nervousness: Secondary | ICD-10-CM | POA: Diagnosis not present

## 2017-09-19 DIAGNOSIS — R4583 Excessive crying of child, adolescent or adult: Secondary | ICD-10-CM | POA: Diagnosis not present

## 2017-09-19 DIAGNOSIS — Z811 Family history of alcohol abuse and dependence: Secondary | ICD-10-CM | POA: Diagnosis not present

## 2017-09-19 DIAGNOSIS — F331 Major depressive disorder, recurrent, moderate: Secondary | ICD-10-CM

## 2017-09-19 MED ORDER — LURASIDONE HCL 40 MG PO TABS: 40.0000 mg | ORAL_TABLET | Freq: Every day | ORAL | 0 refills | Status: DC

## 2017-09-19 MED ORDER — DESVENLAFAXINE SUCCINATE ER 100 MG PO TB24: 100.0000 mg | ORAL_TABLET | Freq: Every day | ORAL | 0 refills | Status: DC

## 2017-09-19 NOTE — Progress Notes (Signed)
BH MD/PA/NP OP Progress Note  09/19/2017 7:39 AM Lauren [name]  MRN:  [id number]  Chief Complaint: I am feeling tired.  I have no energy.  HPI: For her follow-up appointment.  She is taking her medication as prescribed but sometimes she is feeling very tired and she has no energy.  She also complaining of restlessness with [name] and she wants to try a different medication.  She endorses her job is very stressful and sometimes she has crying spells.  She admitted not able to talk to therapist for a while because she was feeling better.  She admitted sometimes she has no motivation to do things.  She feels hopeless and helpless.  She is compliant with [name] and Pristiq.  She has no tremor or shakes or any EPS.  Her appetite is okay.  Her vital signs are stable.  Visit Diagnosis:    ICD-10-CM   1. Major depressive disorder, recurrent episode, moderate (HCC) F33.1 desvenlafaxine (PRISTIQ) 100 MG 24 hr tablet    lurasidone (LATUDA) 40 MG TABS tablet    Past Psychiatric History: Reviewed. She is taking antidepressant from primary care physiciansince 2002 when she was in college. At that time she remembered feeling isolated, withdrawn, having crying spells and decreased energy. She had tried Effexor and Prozac with good response by her primary care physician. She also remembered taking Depakote, Zoloft, Celexa, Cymbalta, Wellbutrin and recently Brintellixand Prozac. Patient was recently given Lamictal with good response until she developed rash.Patient denies any history of abuse in the past.   Past Medical History:  Past Medical History:  Diagnosis Date  . Anemia   . Anxiety   . Constipation   . Depression   . Fibromyalgia   . GERD (gastroesophageal reflux disease)   . Hemorrhoids   . HSV-1 infection   . Hypertension   . Hypothyroidism   . Other abnormal glucose   . Preeclampsia     Past Surgical History:  Procedure Laterality Date  . CHOLECYSTECTOMY  2006  . TONSILLECTOMY     . WISDOM TOOTH EXTRACTION      Family Psychiatric History; reviewed.  Family History:  Family History  Problem Relation Age of Onset  . Hypothyroidism Mother   . Cervical cancer Mother   . Bipolar disorder Mother   . Diabetes Mother   . Colon polyps Mother   . Bipolar disorder Brother   . Kidney disease Brother   . Alcohol abuse Brother   . Hypothyroidism Daughter   . Heart disease Maternal Grandmother   . Diabetes Maternal Grandmother   . Cancer Maternal Grandmother        SKIN  . Hyperlipidemia Maternal Grandmother   . Hypertension Maternal Grandmother   . Hypothyroidism Maternal Grandmother   . Lung cancer Maternal Grandfather   . Heart disease Maternal Aunt   . Diabetes Maternal Aunt   . Diabetes Cousin   . Colon cancer Neg Hx     Social History:  Social History   Social History  . Marital status: Married    Spouse name: N/A  . Number of children: 2  . Years of education: N/A   Occupational History  . TEACHER [employer]   Social History Main Topics  . Smoking status: Never Smoker  . Smokeless tobacco: Never Used  . Alcohol use No     Comment: occ  . Drug use: No  . Sexual activity: Yes    Partners: Male    Birth control/ protection: None  Comment: husband has had a vasectomy   Other Topics Concern  . Not on file   Social History Narrative   4 caffeine drinks daily     Allergies:  Allergies  Allergen Reactions  . Lamictal [Lamotrigine] Rash  . Doxycycline Other (See Comments)    GI UPSET  . Effexor [Venlafaxine] Other (See Comments)    DYSPHORIA  . Prednisone Other (See Comments)    FLUSHED  . Savella [Milnacipran Hcl] Other (See Comments)    NO RELIEF    Metabolic Disorder Labs: Lab Results  Component Value Date   HGBA1C 5.5 03/08/2017   MPG 111 03/08/2017   MPG 111 06/16/2016   No results found for: PROLACTIN Lab Results  Component Value Date   CHOL 170 03/08/2017   TRIG 169 (H) 03/08/2017   HDL 44 (L)  03/08/2017   CHOLHDL 3.9 03/08/2017   VLDL 34 (H) 03/08/2017   LDLCALC 92 03/08/2017   LDLCALC 94 06/16/2016   Lab Results  Component Value Date   TSH 0.93 03/08/2017   TSH 1.89 09/19/2016    Therapeutic Level Labs: No results found for: LITHIUM No results found for: VALPROATE No components found for:  CBMZ  Current Medications: Current Outpatient Prescriptions  Medication Sig Dispense Refill  . ARIPiprazole ([name]) 10 MG tablet Take 1 tablet (10 mg total) by mouth daily. 30 tablet 2  . Cholecalciferol (VITAMIN D) 2000 UNITS CAPS Take 8,000 Units by mouth daily.     Marland Kitchen desvenlafaxine (PRISTIQ) 100 MG 24 hr tablet Take 1 tablet (100 mg total) by mouth daily. 30 tablet 2  . Ferrous Sulfate (IRON) 325 (65 Fe) MG TABS Take by mouth 2 (two) times daily.    Marland Kitchen ibuprofen (ADVIL,MOTRIN) 200 MG tablet Take 200 mg by mouth every 6 (six) hours as needed for pain. Takes 4 tablets (800 mg) every 4-6 hrs prn    . pravastatin (PRAVACHOL) 40 MG tablet TAKE 1 TABLET BY MOUTH DAILY 90 tablet 1  . SYNTHROID 150 MCG tablet TAKE 1 TABLET BY MOUTH EVERY DAY BEFORE BREAKFAST 90 tablet 1   No current facility-administered medications for this visit.      Musculoskeletal: Strength & Muscle Tone: within normal limits Gait & Station: normal Patient leans: N/A  Psychiatric Specialty Exam: Review of Systems  Constitutional: Negative.   HENT: Negative.   Eyes: Negative.   Respiratory: Negative.   Gastrointestinal: Negative.   Genitourinary: Negative.   Musculoskeletal: Negative.   Neurological: Negative.   Psychiatric/Behavioral: Positive for depression. The patient is nervous/anxious.     Blood pressure 130/88, pulse 76, height 5\' 1"  (1.549 m), weight 198 lb 6.4 oz (90 kg).There is no height or weight on file to calculate BMI.  General Appearance: Casual  Eye Contact:  Good  Speech:  Slow  Volume:  Decreased  Mood:  Depressed  Affect:  Congruent  Thought Process:  Goal Directed   Orientation:  Full (Time, Place, and Person)  Thought Content: Logical   Suicidal Thoughts:  No  Homicidal Thoughts:  No  Memory:  Immediate;   Good Recent;   Good Remote;   Good  Judgement:  Good  Insight:  Good  Psychomotor Activity:  Decreased  Concentration:  Concentration: Good and Attention Span: Fair  Recall:  Fair  Fund of Knowledge: Good  Language: Good  Akathisia:  No  Handed:  Right  AIMS (if indicated): not done  Assets:  Communication Skills Desire for Improvement Housing Social Support  ADL's:  Intact  Cognition: WNL  Sleep:  Good   Screenings:   Assessment and Plan: Major depressive disorder, recurrent.  Reassurance given.  She wants to try a different medication.  Recommended to try Latuda 20 mg daily for 1 week and then 40 mg daily.  She will reduce [name] 5 mg for 1 week and then she will stop.  She will continue Pristiq 100 mg daily.  Reinforced to restart counseling.  Discussed medication side effects and benefits.  Recommended to call us back if she has any question or any concern.  Follow-up in 2 months. Time spent 25 minutes.  More than 50% of the time spent in psychoeducation, counseling and coordination of care.  Discuss safety plan that anytime having active suicidal thoughts or homicidal thoughts then patient need to call 911 or go to the local emergency room.     Alphonza Tramell T., MD 09/19/2017, 7:39 AM

## 2017-10-05 ENCOUNTER — Ambulatory Visit (HOSPITAL_COMMUNITY): Payer: Self-pay | Admitting: Psychiatry

## 2017-10-16 ENCOUNTER — Encounter (HOSPITAL_COMMUNITY): Payer: Self-pay | Admitting: Psychiatry

## 2017-10-16 ENCOUNTER — Ambulatory Visit (HOSPITAL_COMMUNITY): Payer: BC Managed Care – PPO | Admitting: Psychiatry

## 2017-10-16 DIAGNOSIS — F331 Major depressive disorder, recurrent, moderate: Secondary | ICD-10-CM

## 2017-10-16 DIAGNOSIS — Z79899 Other long term (current) drug therapy: Secondary | ICD-10-CM

## 2017-10-16 DIAGNOSIS — Z818 Family history of other mental and behavioral disorders: Secondary | ICD-10-CM

## 2017-10-16 DIAGNOSIS — Z811 Family history of alcohol abuse and dependence: Secondary | ICD-10-CM

## 2017-10-16 DIAGNOSIS — F411 Generalized anxiety disorder: Secondary | ICD-10-CM

## 2017-10-16 MED ORDER — DESVENLAFAXINE SUCCINATE ER 100 MG PO TB24: 100.0000 mg | ORAL_TABLET | Freq: Every day | ORAL | 2 refills | Status: DC

## 2017-10-16 MED ORDER — LURASIDONE HCL 40 MG PO TABS: 40.0000 mg | ORAL_TABLET | Freq: Every day | ORAL | 2 refills | Status: DC

## 2017-10-16 NOTE — Progress Notes (Signed)
BH MD/PA/NP OP Progress Note  10/16/2017 2:26 PM Lauren [name]  MRN:  [id number]  Chief Complaint: I like the medication.  I have more energy.  HPI: Patient came for her follow-up appointment.  On her last visit we tried JordanLatuda because she felt [name] making her tired.  She is seeing much improvement with Latuda.  She has more energy.  She is less anxious and less depressed.  She noticed her family also noticed improvement in her mood and depression.  She had a good Thanksgiving.  She denies any crying spells or any panic attack.  She is sleeping good.  She denies any feeling of hopelessness or worthlessness.  Her appetite is okay.  Her energy level is good.  She is not drinking or using any illegal substances.  Visit Diagnosis:    ICD-10-CM   1. Major depressive disorder, recurrent episode, moderate (HCC) F33.1 desvenlafaxine (PRISTIQ) 100 MG 24 hr tablet    lurasidone (LATUDA) 40 MG TABS tablet    Past Psychiatric History: Reviewed. She is taking antidepressant from primary care physiciansince 2002 when she was in college. At that time she remembered feeling isolated, withdrawn, having crying spells and decreased energy. She had tried Effexor and Prozac with good response by her primary care physician. She also remembered taking Depakote, Zoloft, Celexa, Cymbalta, Wellbutrin and recently Brintellixand Prozac. Patient was recently given Lamictal with good response until she developed rash.also tried [name] which work very well but recently complaining of feeling tired and it was switched to JordanLatuda.  Patient denies any history of abuse in the past.   Past Medical History:  Past Medical History:  Diagnosis Date  . Anemia   . Anxiety   . Constipation   . Depression   . Fibromyalgia   . GERD (gastroesophageal reflux disease)   . Hemorrhoids   . HSV-1 infection   . Hypertension   . Hypothyroidism   . Other abnormal glucose   . Preeclampsia     Past Surgical History:   Procedure Laterality Date  . CHOLECYSTECTOMY  2006  . TONSILLECTOMY    . WISDOM TOOTH EXTRACTION      Family Psychiatric History: Reviewed.  Family History:  Family History  Problem Relation Age of Onset  . Hypothyroidism Mother   . Cervical cancer Mother   . Bipolar disorder Mother   . Diabetes Mother   . Colon polyps Mother   . Bipolar disorder Brother   . Kidney disease Brother   . Alcohol abuse Brother   . Hypothyroidism Daughter   . Heart disease Maternal Grandmother   . Diabetes Maternal Grandmother   . Cancer Maternal Grandmother        SKIN  . Hyperlipidemia Maternal Grandmother   . Hypertension Maternal Grandmother   . Hypothyroidism Maternal Grandmother   . Lung cancer Maternal Grandfather   . Heart disease Maternal Aunt   . Diabetes Maternal Aunt   . Diabetes Cousin   . Colon cancer Neg Hx     Social History:  Social History   Socioeconomic History  . Marital status: Married    Spouse name: None  . Number of children: 2  . Years of education: None  . Highest education level: None  Social Needs  . Financial resource strain: None  . Food insecurity - worry: None  . Food insecurity - inability: None  . Transportation needs - medical: None  . Transportation needs - non-medical: None  Occupational History  . Occupation: TEACHER  Employer: [employer]  Tobacco Use  . Smoking status: Never Smoker  . Smokeless tobacco: Never Used  Substance and Sexual Activity  . Alcohol use: No    Alcohol/week: 0.0 oz    Comment: occ  . Drug use: No  . Sexual activity: Yes    Partners: Male    Birth control/protection: None    Comment: husband has had a vasectomy  Other Topics Concern  . None  Social History Narrative   4 caffeine drinks daily     Allergies:  Allergies  Allergen Reactions  . Lamictal [Lamotrigine] Rash  . Doxycycline Other (See Comments)    GI UPSET  . Effexor [Venlafaxine] Other (See Comments)    DYSPHORIA  .  Prednisone Other (See Comments)    FLUSHED  . Savella [Milnacipran Hcl] Other (See Comments)    NO RELIEF    Metabolic Disorder Labs: Lab Results  Component Value Date   HGBA1C 5.5 03/08/2017   MPG 111 03/08/2017   MPG 111 06/16/2016   No results found for: PROLACTIN Lab Results  Component Value Date   CHOL 170 03/08/2017   TRIG 169 (H) 03/08/2017   HDL 44 (L) 03/08/2017   CHOLHDL 3.9 03/08/2017   VLDL 34 (H) 03/08/2017   LDLCALC 92 03/08/2017   LDLCALC 94 06/16/2016   Lab Results  Component Value Date   TSH 0.93 03/08/2017   TSH 1.89 09/19/2016    Therapeutic Level Labs: No results found for: LITHIUM No results found for: VALPROATE No components found for:  CBMZ  Current Medications: Current Outpatient Medications  Medication Sig Dispense Refill  . Cholecalciferol (VITAMIN D) 2000 UNITS CAPS Take 8,000 Units by mouth daily.     Marland Kitchen desvenlafaxine (PRISTIQ) 100 MG 24 hr tablet Take 1 tablet (100 mg total) by mouth daily. 30 tablet 0  . Ferrous Sulfate (IRON) 325 (65 Fe) MG TABS Take by mouth 2 (two) times daily.    Marland Kitchen ibuprofen (ADVIL,MOTRIN) 200 MG tablet Take 200 mg by mouth every 6 (six) hours as needed for pain. Takes 4 tablets (800 mg) every 4-6 hrs prn    . lurasidone (LATUDA) 40 MG TABS tablet Take 1 tablet (40 mg total) by mouth daily with breakfast. 30 tablet 0  . pravastatin (PRAVACHOL) 40 MG tablet TAKE 1 TABLET BY MOUTH DAILY 90 tablet 1  . SYNTHROID 150 MCG tablet TAKE 1 TABLET BY MOUTH EVERY DAY BEFORE BREAKFAST 90 tablet 1   No current facility-administered medications for this visit.      Musculoskeletal: Strength & Muscle Tone: within normal limits Gait & Station: normal Patient leans: N/A  Psychiatric Specialty Exam: ROS  Blood pressure 122/82, pulse 83, height 5\' 1"  (1.549 m), weight 200 lb (90.7 kg), SpO2 98 %.Body mass index is 37.79 kg/m.  General Appearance: Casual  Eye Contact:  Good  Speech:  Clear and Coherent  Volume:  Normal   Mood:  Euthymic  Affect:  Congruent  Thought Process:  Goal Directed  Orientation:  Full (Time, Place, and Person)  Thought Content: Logical   Suicidal Thoughts:  No  Homicidal Thoughts:  No  Memory:  Immediate;   Good Recent;   Good Remote;   Good  Judgement:  Good  Insight:  Good  Psychomotor Activity:  Normal  Concentration:  Concentration: Good and Attention Span: Good  Recall:  Good  Fund of Knowledge: Good  Language: Good  Akathisia:  No  Handed:  Right  AIMS (if indicated): not done  Assets:  Communication Skills Desire for Improvement Housing Resilience Social Support  ADL's:  Intact  Cognition: WNL  Sleep:  Good   Screenings:   Assessment and Plan: Major depressive disorder, recurrent.  Generalized anxiety disorder.  Patient doing better on Latuda 40 mg daily.  I will continue Latuda 40 mg daily and Pristiq 100 mg daily.  She is tolerating her medication and reported no side effects.  Discussed medication side effects and benefits.  Recommended to call us back if she has any question, concern or if you feel worsening of the symptoms.  Follow-up in 3 months.   Cleotis NipperSyed T Jeananne Bedwell, MD 10/16/2017, 2:26 PM

## 2017-10-20 ENCOUNTER — Other Ambulatory Visit (HOSPITAL_COMMUNITY): Payer: Self-pay | Admitting: Psychiatry

## 2017-10-20 DIAGNOSIS — F331 Major depressive disorder, recurrent, moderate: Secondary | ICD-10-CM

## 2017-10-25 ENCOUNTER — Encounter: Payer: Self-pay | Admitting: Adult Health

## 2017-10-25 ENCOUNTER — Ambulatory Visit: Payer: BC Managed Care – PPO | Admitting: Adult Health

## 2017-10-25 VITALS — BP 122/88 | HR 86 | Temp 97.7°F | Ht 61.0 in | Wt 202.0 lb

## 2017-10-25 DIAGNOSIS — R300 Dysuria: Secondary | ICD-10-CM

## 2017-10-25 DIAGNOSIS — N3000 Acute cystitis without hematuria: Secondary | ICD-10-CM

## 2017-10-25 MED ORDER — SULFAMETHOXAZOLE-TRIMETHOPRIM 800-160 MG PO TABS: 1.0000 | ORAL_TABLET | Freq: Two times a day (BID) | ORAL | 0 refills | Status: DC

## 2017-10-25 NOTE — Progress Notes (Signed)
Assessment and Plan:  Lauren [name] was seen today for urinary tract infection.  Diagnoses and all orders for this visit:  Acute cystitis without hematuria Urinary tract infection Maintain adequate hydration. Follow up if symptoms not improving, and as needed. Discussed perineal hygiene, may use OTC azo for discomfort for first 24 hours -     sulfamethoxazole-trimethoprim (BACTRIM DS,SEPTRA DS) 800-160 MG tablet; Take 1 tablet by mouth 2 (two) times daily.  Dysuria -     Urinalysis w microscopic + reflex cultur  Hypertension Not currently on treatment -  Reports intermittently elevated BPs at home; will have her bring in her BP cuff at a [facility] to compare values and have her keep a BP log taken at different times of the day.   Further disposition pending results of labs. Discussed med's effects and SE's.   Over 15 minutes of exam, counseling, chart review, and critical decision making was performed.   Future Appointments  Date Time Provider Department Center  10/30/2017  4:30 PM [facility] NURSE [facility] None  12/11/2017  4:00 PM Lauren [name], Lauren [name], CNM [facility]  01/16/2018  4:30 PM [name], [name], MD [facility] None  03/08/2018  3:00 PM Lauren [name], Amanda, PA-C [facility] None    ------------------------------------------------------------------------------------------------------------------   HPI BP 122/88   Pulse 86   Temp 97.7 F (36.5 C)   Ht 5\' 1"  (1.549 m)   Wt 202 lb (91.6 kg)   LMP 10/18/2017 (Approximate)   SpO2 99%   BMI 38.17 kg/m   36 y.o.female presents for urinary frequency and urgency x 1 day. She endorses mild nausea. She denies dysuria, changes in urine character, abdominal pain, V/D, vaginal discharge, rashes, perineal itching/dryness, back pain, blood in urine. She is married and denies new sexual partners, declines STD testing. She reports a history of occasional UTIs, reports this feels similar.   She reports some elevated BPs at home; normal BP today.     Past Medical History:  Diagnosis Date  . Anemia   . Anxiety   . Constipation   . Depression   . Fibromyalgia   . GERD (gastroesophageal reflux disease)   . Hemorrhoids   . HSV-1 infection   . Hypertension   . Hypothyroidism   . Other abnormal glucose   . Preeclampsia      Allergies  Allergen Reactions  . Lamictal [Lamotrigine] Rash  . Doxycycline Other (See Comments)    GI UPSET  . Effexor [Venlafaxine] Other (See Comments)    DYSPHORIA  . Prednisone Other (See Comments)    FLUSHED  . Savella [Milnacipran Hcl] Other (See Comments)    NO RELIEF    Current Outpatient Medications on File Prior to Visit  Medication Sig  . Cholecalciferol (VITAMIN D) 2000 UNITS CAPS Take 8,000 Units by mouth daily.   Marland Kitchen. desvenlafaxine (PRISTIQ) 100 MG 24 hr tablet Take 1 tablet (100 mg total) by mouth daily.  . Ferrous Sulfate (IRON) 325 (65 Fe) MG TABS Take by mouth 2 (two) times daily.  Marland Kitchen. ibuprofen (ADVIL,MOTRIN) 200 MG tablet Take 200 mg by mouth every 6 (six) hours as needed for pain. Takes 4 tablets (800 mg) every 4-6 hrs prn  . lurasidone (LATUDA) 40 MG TABS tablet Take 1 tablet (40 mg total) by mouth daily with breakfast.  . pravastatin (PRAVACHOL) 40 MG tablet TAKE 1 TABLET BY MOUTH DAILY  . SYNTHROID 150 MCG tablet TAKE 1 TABLET BY MOUTH EVERY DAY BEFORE BREAKFAST   No current facility-administered medications on file prior to  visit.     ROS: all negative except above.   Physical Exam:  BP 122/88   Pulse 86   Temp 97.7 F (36.5 C)   Ht 5\' 1"  (1.549 m)   Wt 202 lb (91.6 kg)   LMP 10/18/2017 (Approximate)   SpO2 99%   BMI 38.17 kg/m   General Appearance: Well nourished, in no apparent distress. Neck: Supple. Respiratory: Respiratory effort normal, BS equal bilaterally without rales, rhonchi, wheezing or stridor.  Cardio: RRR with no MRGs. Brisk peripheral pulses without edema.  Abdomen: Soft, + BS.  Non tender, no guarding, rebound, hernias, masses. Lymphatics: Non  tender without lymphadenopathy.  Skin: Warm, dry without rashes, lesions, ecchymosis.  Psych: Awake and oriented X 3, normal affect, Insight and Judgment appropriate.     Dan MakerAshley C Calin Fantroy, NP 4:34 PM [facility]

## 2017-10-25 NOTE — Patient Instructions (Signed)

## 2017-10-28 LAB — URINALYSIS W MICROSCOPIC + REFLEX CULTURE
BACTERIA UA: NONE SEEN /HPF
BILIRUBIN URINE: NEGATIVE
Glucose, UA: NEGATIVE
HYALINE CAST: NONE SEEN /LPF
Hgb urine dipstick: NEGATIVE
Ketones, ur: NEGATIVE
NITRITES URINE, INITIAL: NEGATIVE
Protein, ur: NEGATIVE
RBC / HPF: NONE SEEN /HPF (ref 0–2)
SPECIFIC GRAVITY, URINE: 1.02 (ref 1.001–1.03)
SQUAMOUS EPITHELIAL / LPF: NONE SEEN /HPF (ref <=5)
pH: 6 (ref 5.0–8.0)

## 2017-10-28 LAB — URINE CULTURE
MICRO NUMBER: 81373262
SPECIMEN QUALITY: ADEQUATE

## 2017-10-28 LAB — CULTURE INDICATED

## 2017-10-30 ENCOUNTER — Ambulatory Visit: Payer: Self-pay

## 2017-11-06 ENCOUNTER — Ambulatory Visit (INDEPENDENT_AMBULATORY_CARE_PROVIDER_SITE_OTHER): Payer: BC Managed Care – PPO

## 2017-11-06 VITALS — BP 126/80

## 2017-11-06 DIAGNOSIS — Z0130 Encounter for examination of blood pressure without abnormal findings: Secondary | ICD-10-CM | POA: Diagnosis not present

## 2017-11-06 NOTE — Progress Notes (Signed)
Pt presents for Bp check which was checked twice & the results were entered into Epic. Then the pt's blood pressure was checked by her two blood pressure machine & compared to our machine one of which was way off & the other one was correct. The pt seemed happy with the results & checked out w/o any issues.

## 2017-11-16 ENCOUNTER — Ambulatory Visit: Payer: Self-pay | Admitting: Physician Assistant

## 2017-11-22 LAB — HM COLONOSCOPY

## 2017-11-30 ENCOUNTER — Encounter: Payer: Self-pay | Admitting: *Deleted

## 2017-12-11 ENCOUNTER — Ambulatory Visit: Payer: BC Managed Care – PPO | Admitting: Women's Health

## 2017-12-21 ENCOUNTER — Encounter: Payer: Self-pay | Admitting: Physician Assistant

## 2017-12-21 ENCOUNTER — Ambulatory Visit: Payer: BC Managed Care – PPO | Admitting: Physician Assistant

## 2017-12-21 VITALS — BP 126/84 | HR 82 | Temp 98.1°F | Resp 16 | Ht 61.0 in | Wt 195.0 lb

## 2017-12-21 DIAGNOSIS — G4730 Sleep apnea, unspecified: Secondary | ICD-10-CM | POA: Diagnosis not present

## 2017-12-21 DIAGNOSIS — K760 Fatty (change of) liver, not elsewhere classified: Secondary | ICD-10-CM | POA: Diagnosis not present

## 2017-12-21 DIAGNOSIS — K769 Liver disease, unspecified: Secondary | ICD-10-CM

## 2017-12-21 DIAGNOSIS — Z130 Encounter for screening for diseases of the blood and blood-forming organs and certain disorders involving the immune mechanism: Secondary | ICD-10-CM

## 2017-12-21 DIAGNOSIS — H9201 Otalgia, right ear: Secondary | ICD-10-CM | POA: Diagnosis not present

## 2017-12-21 DIAGNOSIS — Z79899 Other long term (current) drug therapy: Secondary | ICD-10-CM

## 2017-12-21 DIAGNOSIS — R5383 Other fatigue: Secondary | ICD-10-CM | POA: Diagnosis not present

## 2017-12-21 DIAGNOSIS — R932 Abnormal findings on diagnostic imaging of liver and biliary tract: Secondary | ICD-10-CM | POA: Diagnosis not present

## 2017-12-21 DIAGNOSIS — E559 Vitamin D deficiency, unspecified: Secondary | ICD-10-CM

## 2017-12-21 DIAGNOSIS — R160 Hepatomegaly, not elsewhere classified: Secondary | ICD-10-CM | POA: Diagnosis not present

## 2017-12-21 NOTE — Patient Instructions (Addendum)
We will send you for a sleep study We will set you up for an MRI of your AB We will check some labs for your fatigue  Here is some information below about TMJ, fatty liver, and fatigue.   Being dehydrated can hurt your kidneys, cause fatigue, headaches, muscle aches, joint pain, and dry skin/nails so please increase your fluids.   Drink 80-100 oz a day of water, measure it out! Eat 3 meals a day, have to do breakfast, eat protein- hard boiled eggs, protein bar like nature [place] protein bar, greek yogurt like oikos triple zero, chobani 100, or light n fit greek  Can check out plantnanny app on your phone to help you keep track of your water      What is the TMJ? The temporomandibular (tem-PUH-ro-man-DIB-yoo-ler) joint, or the TMJ, connects the upper and lower jawbones. This joint allows the jaw to open wide and move back and forth when you chew, talk, or yawn.There are also several muscles that help this joint move. There can be muscle tightness and pain in the muscle that can cause several symptoms.  What causes TMJ pain? There are many causes of TMJ pain. Repeated chewing (for example, chewing gum) and clenching your teeth can cause pain in the joint. Some TMJ pain has no obvious cause. What can I do to ease the pain? There are many things you can do to help your pain get better. When you have pain:  Eat soft foods and stay away from chewy foods (for example, taffy) Try to use both sides of your mouth to chew Don't chew gum Massage Don't open your mouth wide (for example, during yawning or singing) Don't bite your cheeks or fingernails Lower your amount of stress and worry Applying a warm, damp washcloth to the joint may help. Over-the-counter pain medicines such as ibuprofen (one brand: Advil) or acetaminophen (one brand: Tylenol) might also help. Do not use these medicines if you are allergic to them or if your doctor told you not to use them. How can I stop the pain from coming  back? When your pain is better, you can do these exercises to make your muscles stronger and to keep the pain from coming back:  Resisted mouth opening: Place your thumb or two fingers under your chin and open your mouth slowly, pushing up lightly on your chin with your thumb. Hold for three to six seconds. Close your mouth slowly. Resisted mouth closing: Place your thumbs under your chin and your two index fingers on the ridge between your mouth and the bottom of your chin. Push down lightly on your chin as you close your mouth. Tongue up: Slowly open and close your mouth while keeping the tongue touching the roof of the mouth. Side-to-side jaw movement: Place an object about one fourth of an inch thick (for example, two tongue depressors) between your front teeth. Slowly move your jaw from side to side. Increase the thickness of the object as the exercise becomes easier Forward jaw movement: Place an object about one fourth of an inch thick between your front teeth and move the bottom jaw forward so that the bottom teeth are in front of the top teeth. Increase the thickness of the object as the exercise becomes easier. These exercises should not be painful. If it hurts to do these exercises, stop doing them and talk to your family doctor.    Fatty liver or Nonalcoholic fatty liver disease (NASH) is now the leading cause of  liver failure in the [place]. It is normally from such risk factors as obesity, diabetes, insulin resistance, high cholesterol, or metabolic syndrome. The only definitive therapy is weight loss and exercise.  Suggest walking 20-30 mins daily.  Decreasing carbohydrates, increasing veggies.  Vitamin E 800 IU a day may be beneficial. Liver cancer has been noted in patient with fatty liver without cirrhosis.  Will monitor closely   Fatty Liver Fatty liver is the accumulation of fat in liver cells. It is also called hepatosteatosis or steatohepatitis. It is normal for your  liver to contain some fat. If fat is more than 5 to 10% of your liver's weight, you have fatty liver.  There are often no symptoms (problems) for years while damage is still occurring. People often learn about their fatty liver when they have medical tests for other reasons. Fat can damage your liver for years or even decades without causing problems. When it becomes severe, it can cause fatigue, weight loss, weakness, and confusion. This makes you more likely to develop more serious liver problems. The liver is the largest organ in the body. It does a lot of work and often gives no warning signs when it is sick until late in a disease. The liver has many important jobs including:  Breaking down foods.  Storing vitamins, iron, and other minerals.  Making proteins.  Making bile for food digestion.  Breaking down many products including medications, alcohol and some poisons.  PROGNOSIS  Fatty liver may cause no damage or it can lead to an inflammation of the liver. This is, called steatohepatitis.  Over time the liver may become scarred and hardened. This condition is called cirrhosis. Cirrhosis is serious and may lead to liver failure or cancer. NASH is one of the leading causes of cirrhosis. About 10-20% of Americans have fatty liver and a smaller 2-5% has NASH.  TREATMENT   Weight loss, fat restriction, and exercise in overweight patients produces inconsistent results but is worth trying.  Good control of diabetes may reduce fatty liver.  Eat a balanced, healthy diet.  Increase your physical activity.  There are no medical or surgical treatments for a fatty liver or NASH, but improving your diet and increasing your exercise may help prevent or reverse some of the damage.    Fatigue Fatigue is feeling tired all of the time, a lack of energy, or a lack of motivation. Occasional or mild fatigue is often a normal response to activity or life in general. However, long-lasting (chronic)  or extreme fatigue may indicate an underlying medical condition. Follow these instructions at home: Watch your fatigue for any changes. The following actions may help to lessen any discomfort you are feeling:  Talk to your health care provider about how much sleep you need each night. Try to get the required amount every night.  Take medicines only as directed by your health care provider.  Eat a healthy and nutritious diet. Ask your health care provider if you need help changing your diet.  Drink enough fluid to keep your urine clear or pale yellow.  Practice ways of relaxing, such as yoga, meditation, massage therapy, or acupuncture.  Exercise regularly.  Change situations that cause you stress. Try to keep your work and personal routine reasonable.  Do not abuse illegal drugs.  Limit alcohol intake to no more than 1 drink per day for nonpregnant women and 2 drinks per day for men. One drink equals 12 ounces of beer, 5 ounces of  wine, or 1 ounces of hard liquor.  Take a multivitamin, if directed by your health care provider.  Contact a health care provider if:  Your fatigue does not get better.  You have a fever.  You have unintentional weight loss or gain.  You have headaches.  You have difficulty: ? Falling asleep. ? Sleeping throughout the night.  You feel angry, guilty, anxious, or sad.  You are unable to have a bowel movement (constipation).  You skin is dry.  Your legs or another part of your body is swollen. Get help right away if:  You feel confused.  Your vision is blurry.  You feel faint or pass out.  You have a severe headache.  You have severe abdominal, pelvic, or back pain.  You have chest pain, shortness of breath, or an irregular or fast heartbeat.  You are unable to urinate or you urinate less than normal.  You develop abnormal bleeding, such as bleeding from the rectum, vagina, nose, lungs, or nipples.  You vomit blood.  You have  thoughts about harming yourself or committing suicide.  You are worried that you might harm someone else. This information is not intended to replace advice given to you by your health care provider. Make sure you discuss any questions you have with your health care provider. Document Released: 09/04/2007 Document Revised: 04/14/2016 Document Reviewed: 03/11/2014 Elsevier Interactive Patient Education  [employer]2018 [employer]

## 2017-12-21 NOTE — Progress Notes (Signed)
Subjective:    Patient ID: Lauren [name], female    DOB: Jul 13, 1981, 37 y.o.   MRN: [id number]  HPI 37 y.o. WF with history of hypothyroidism, depression following with Dr. Lolly Mustache,  obesity presents with fatigue and R ear pain.   She states at work x 1-2 weeks she has been having to nap at work. She is sleeping well, but she states when she wakes up she is still tired. She does snore at night, she had sleep study 10 + years ago, but has gained weight since that time.  She has some constipation, has been on probiotic, not on PPI/stomach medication.   She admits that her diet has not been good due to fatigue. She states she is too tired to do anything. She has been under more stress at work and home.   Last iron was 2017. Last B12 2017 Normal ANA, antiDNA  She has history of elevated LFT, CT AB with marked hepatomegaly and steatosis, also.Marland KitchenMarland KitchenMarland KitchenHepatic steatosis and hepatomegaly. 11 mm focus of relative hyper attenuation in the left liver lobe is indeterminate. Although this could represent an area of fat sparing, it is not readily apparent back in 2012. as ultrasound characterization would likely not be possible secondary to the extent of steatosis, consider further evaluation with nonemergent outpatient pre and post contrast abdominal MRI. Had colonoscopy 2018 has never had EGD.   She has had right ear pain, intermittent x 3 weeks. Denies sinus symptoms, headaches, decreased hearing, tinnitus, dizziness/imbalance.   She is taking her TSH first thing in the AM, takes with water, takes with all of her morning medications, does wait to eat until she is at work.  Lab Results  Component Value Date   TSH 0.93 03/08/2017   BMI is Body mass index is 36.84 kg/m., she is working on diet and exercise. Wt Readings from Last 3 Encounters:  12/21/17 195 lb (88.5 kg)  10/25/17 202 lb (91.6 kg)  06/01/17 197 lb 3.2 oz (89.4 kg)    Blood pressure 126/84, pulse 82, temperature 98.1 F (36.7 C),  resp. rate 16, height 5\' 1"  (1.549 m), weight 195 lb (88.5 kg), last menstrual period 12/07/2017, SpO2 98 %.  Medications Current Outpatient Medications on File Prior to Visit  Medication Sig  . Cholecalciferol (VITAMIN D) 2000 UNITS CAPS Take 8,000 Units by mouth daily.   Marland Kitchen desvenlafaxine (PRISTIQ) 100 MG 24 hr tablet Take 1 tablet (100 mg total) by mouth daily.  . Ferrous Sulfate (IRON) 325 (65 Fe) MG TABS Take by mouth 2 (two) times daily.  Marland Kitchen ibuprofen (ADVIL,MOTRIN) 200 MG tablet Take 200 mg by mouth every 6 (six) hours as needed for pain. Takes 4 tablets (800 mg) every 4-6 hrs prn  . lurasidone (LATUDA) 40 MG TABS tablet Take 1 tablet (40 mg total) by mouth daily with breakfast.  . pravastatin (PRAVACHOL) 40 MG tablet TAKE 1 TABLET BY MOUTH DAILY  . SYNTHROID 150 MCG tablet TAKE 1 TABLET BY MOUTH EVERY DAY BEFORE BREAKFAST   No current facility-administered medications on file prior to visit.     Problem list She has Hypothyroidism; Paralysis, periodic secondary; Depression; Hypertension; Prediabetes; Hyperlipidemia LDL goal <100; Obese; Medication management; Paraparesis (HCC); Headache associated with sexual activity; and Dizziness on their problem list.   Review of Systems  Constitutional: Positive for fatigue. Negative for activity change, appetite change, chills, diaphoresis, fever and unexpected weight change.  HENT: Positive for ear pain. Negative for congestion, sinus pain and tinnitus.  Eyes: Negative.   Respiratory: Negative.  Negative for cough, chest tightness and shortness of breath.   Cardiovascular: Negative.  Negative for chest pain, palpitations and leg swelling.  Gastrointestinal: Positive for abdominal pain (GERD). Negative for abdominal distention, anal bleeding, blood in stool, constipation, diarrhea, nausea, rectal pain and vomiting.  Genitourinary: Negative for difficulty urinating, dyspareunia, dysuria, flank pain, frequency, hematuria and urgency.   Musculoskeletal: Negative for arthralgias and myalgias.  Skin: Negative.   Neurological: Negative.  Negative for dizziness, tremors, syncope and weakness.  Psychiatric/Behavioral: Positive for sleep disturbance. Negative for dysphoric mood and self-injury.     Allergies Allergies  Allergen Reactions  . Lamictal [Lamotrigine] Rash  . Doxycycline Other (See Comments)    GI UPSET  . Effexor [Venlafaxine] Other (See Comments)    DYSPHORIA  . Prednisone Other (See Comments)    FLUSHED  . Savella [Milnacipran Hcl] Other (See Comments)    NO RELIEF    SURGICAL HISTORY She  has a past surgical history that includes Cholecystectomy (2006); Tonsillectomy; and Wisdom tooth extraction. FAMILY HISTORY Her family history includes Alcohol abuse in her brother; Bipolar disorder in her brother and mother; Cancer in her maternal grandmother; Cervical cancer in her mother; Colon polyps in her mother; Diabetes in her cousin, maternal aunt, maternal grandmother, and mother; Heart disease in her maternal aunt and maternal grandmother; Hyperlipidemia in her maternal grandmother; Hypertension in her maternal grandmother; Hypothyroidism in her daughter, maternal grandmother, and mother; Kidney disease in her brother; Lung cancer in her maternal grandfather. SOCIAL HISTORY She  reports that  has never smoked. she has never used smokeless tobacco. She reports that she does not drink alcohol or use drugs.     Objective:   Physical Exam  Constitutional: She is oriented to person, place, and time. She appears well-developed and well-nourished. No distress.  HENT:  Head: Normocephalic and atraumatic.  Mouth/Throat: Oropharynx is clear and moist. No oropharyngeal exudate.  + TMJ right side, no abscess/teeth problems.   Eyes: Conjunctivae and EOM are normal. Pupils are equal, round, and reactive to light. No scleral icterus.  Neck: Normal range of motion. Neck supple. No JVD present. No thyromegaly present.   Cardiovascular: Normal rate, regular rhythm, normal heart sounds and intact distal pulses. Exam reveals no gallop and no friction rub.  No murmur heard. Pulmonary/Chest: Effort normal and breath sounds normal. No respiratory distress. She has no wheezes. She has no rales. She exhibits no tenderness.  Abdominal: Soft. Bowel sounds are normal. She exhibits no distension and no mass. There is tenderness (RUQ). There is no rebound and no guarding.  Musculoskeletal: Normal range of motion.  Lymphadenopathy:    She has no cervical adenopathy.  Neurological: She is alert and oriented to person, place, and time. She has normal strength. No cranial nerve deficit or sensory deficit. Coordination normal.  Skin: Skin is warm and dry. She is not diaphoretic.  Psychiatric: Her speech is normal. Judgment and thought content normal. She is slowed. Cognition and memory are normal. She exhibits a depressed mood.  Nursing note and vitals reviewed.     Assessment & Plan:   Right ear pain information given to the patient, no gum/decrease hard foods, warm wet wash clothes, decrease stress,  can do massage, and exercise.  Very important to follow up with dentist  Fatigue, unspecified type - Discussed lifestyle modification as means of resolving problem, Training modifications discussed, See orders for lab evaluation, Discussed how depression can be a cause of fatigue if  all labs are negative will discuss depression treatment.  Will get sleep study- crowded mouth, weight gain, snoring, fatigue in AM Get protein in the morning, not carbs, check insulin -     CBC with Differential/Platelet -     BASIC METABOLIC PANEL WITH GFR -     Hepatic function panel -     TSH -     Magnesium -     VITAMIN D 25 Hydroxy (Vit-D Deficiency, Fractures) -     Iron,Total/Total Iron Binding Cap -     Folate RBC -     Vitamin B12 -     Ambulatory referral to Sleep Studies -     Insulin, random  Sleep apnea, unspecified  type -     Ambulatory referral to Sleep Studies  Hepatic steatosis with abnormal CT scan -     MR Abdomen W Wo Contrast; Future Weight loss advised, avoid alcohol/tylenol, will monitor LFTs - discussed possible progression to cirrhosis and given information.   Vitamin D deficiency -     VITAMIN D 25 Hydroxy (Vit-D Deficiency, Fractures)  Medication management -     CBC with Differential/Platelet -     BASIC METABOLIC PANEL WITH GFR -     Hepatic function panel -     Magnesium

## 2017-12-22 LAB — BASIC METABOLIC PANEL WITH GFR
BUN: 11 mg/dL (ref 7–25)
CALCIUM: 9.5 mg/dL (ref 8.6–10.2)
CO2: 27 mmol/L (ref 20–32)
CREATININE: 0.74 mg/dL (ref 0.50–1.10)
Chloride: 102 mmol/L (ref 98–110)
GFR, EST AFRICAN AMERICAN: 119 mL/min/{1.73_m2} (ref >=60)
GFR, EST NON AFRICAN AMERICAN: 103 mL/min/{1.73_m2} (ref >=60)
Glucose, Bld: 82 mg/dL (ref 65–99)
POTASSIUM: 3.8 mmol/L (ref 3.5–5.3)
Sodium: 139 mmol/L (ref 135–146)

## 2017-12-22 LAB — VITAMIN D 25 HYDROXY (VIT D DEFICIENCY, FRACTURES): Vit D, 25-Hydroxy: 53 ng/mL (ref 30–100)

## 2017-12-22 LAB — CBC WITH DIFFERENTIAL/PLATELET
BASOS ABS: 19 {cells}/uL (ref 0–200)
Basophils Relative: 0.2 %
Eosinophils Absolute: 67 cells/uL (ref 15–500)
Eosinophils Relative: 0.7 %
HCT: 39.9 % (ref 35.0–45.0)
Hemoglobin: 13.6 g/dL (ref 11.7–15.5)
Lymphs Abs: 3552 cells/uL (ref 850–3900)
MCH: 29.2 pg (ref 27.0–33.0)
MCHC: 34.1 g/dL (ref 32.0–36.0)
MCV: 85.6 fL (ref 80.0–100.0)
MONOS PCT: 6.9 %
MPV: 11 fL (ref 7.5–12.5)
NEUTROS PCT: 55.2 %
Neutro Abs: 5299 cells/uL (ref 1500–7800)
PLATELETS: 183 10*3/uL (ref 140–400)
RBC: 4.66 10*6/uL (ref 3.80–5.10)
RDW: 12.7 % (ref 11.0–15.0)
TOTAL LYMPHOCYTE: 37 %
WBC mixed population: 662 cells/uL (ref 200–950)
WBC: 9.6 10*3/uL (ref 3.8–10.8)

## 2017-12-22 LAB — TSH: TSH: 0.47 m[IU]/L

## 2017-12-22 LAB — HEPATIC FUNCTION PANEL
AG RATIO: 1.7 (calc) (ref 1.0–2.5)
ALBUMIN MSPROF: 4.6 g/dL (ref 3.6–5.1)
ALT: 51 U/L — AB (ref 6–29)
AST: 41 U/L — ABNORMAL HIGH (ref 10–30)
Alkaline phosphatase (APISO): 66 U/L (ref 33–115)
BILIRUBIN DIRECT: 0.1 mg/dL (ref 0.0–0.2)
BILIRUBIN TOTAL: 0.6 mg/dL (ref 0.2–1.2)
Globulin: 2.7 g/dL (calc) (ref 1.9–3.7)
Indirect Bilirubin: 0.5 mg/dL (calc) (ref 0.2–1.2)
Total Protein: 7.3 g/dL (ref 6.1–8.1)

## 2017-12-22 LAB — IRON, TOTAL/TOTAL IRON BINDING CAP
%SAT: 22 % (ref 11–50)
IRON: 75 ug/dL (ref 40–190)
TIBC: 343 ug/dL (ref 250–450)

## 2017-12-22 LAB — FOLATE RBC: RBC FOLATE: 679 ng/mL (ref >280)

## 2017-12-22 LAB — INSULIN, RANDOM: INSULIN: 17.7 u[IU]/mL (ref 2.0–19.6)

## 2017-12-22 LAB — VITAMIN B12: VITAMIN B 12: 551 pg/mL (ref 200–1100)

## 2017-12-22 LAB — MAGNESIUM: Magnesium: 2 mg/dL (ref 1.5–2.5)

## 2017-12-28 ENCOUNTER — Other Ambulatory Visit (HOSPITAL_COMMUNITY)
Admission: RE | Admit: 2017-12-28 | Discharge: 2017-12-28 | Disposition: A | Discharge status: Home / Self Care | Payer: BC Managed Care – PPO | Source: Ambulatory Visit | Attending: Obstetrics & Gynecology | Admitting: Obstetrics & Gynecology

## 2017-12-28 ENCOUNTER — Encounter: Payer: Self-pay | Admitting: Women's Health

## 2017-12-28 ENCOUNTER — Ambulatory Visit: Payer: BC Managed Care – PPO | Admitting: Women's Health

## 2017-12-28 VITALS — BP 120/80 | HR 98 | Ht 61.0 in | Wt 198.8 lb

## 2017-12-28 DIAGNOSIS — N898 Other specified noninflammatory disorders of vagina: Secondary | ICD-10-CM

## 2017-12-28 DIAGNOSIS — Z01411 Encounter for gynecological examination (general) (routine) with abnormal findings: Secondary | ICD-10-CM

## 2017-12-28 DIAGNOSIS — Z01419 Encounter for gynecological examination (general) (routine) without abnormal findings: Secondary | ICD-10-CM

## 2017-12-28 DIAGNOSIS — Z124 Encounter for screening for malignant neoplasm of cervix: Secondary | ICD-10-CM

## 2017-12-28 MED ORDER — METRONIDAZOLE 0.75 % VA GEL: 1.0000 | Freq: Every evening | VAGINAL | 0 refills | Status: DC | PRN

## 2017-12-28 NOTE — Addendum Note (Signed)
Addended by: Federico FlakeNES, PEGGY A on: 12/28/2017 04:39 PM   Modules accepted: Orders

## 2017-12-28 NOTE — Progress Notes (Signed)
   WELL-WOMAN EXAMINATION Patient name: Lauren PettiesMandy E Hardman MRN [id number]  Date of birth: 03/13/1981 Chief Complaint:   Gynecologic Exam (pap only)  History of Present Illness:   Lauren PettiesMandy E Havens is a 37 y.o. G2P2 Caucasian female being seen today for a routine well-woman exam breast exam/pap smear only.  Current complaints: none. Requests refill on metrogel, uses prn for vaginal irritation. Periods regular x 5 days, changes pad 4-5x/day, some small clots, +cramping.   PCP: Dr. Ernst BowlerMcKowen Gbso      does not desire labs, just done w/ PCP Patient's last menstrual period was 12/07/2017. Last pap 12/10/14. Results were: neg w/ -HRHPV Last mammogram: never. Results were: n/a Last colonoscopy: Jan for h/o polyps. Results were: normal  Review of Systems:   Pertinent items are noted in HPI Denies any headaches, blurred vision, fatigue, shortness of breath, chest pain, abdominal pain, abnormal vaginal discharge/itching/odor/irritation, problems with periods, bowel movements, urination, or intercourse unless otherwise stated above. Pertinent History Reviewed:  Reviewed past medical,surgical, social and family history.  Reviewed problem list, medications and allergies. Physical Assessment:   Vitals:   12/28/17 1559  BP: 120/80  Pulse: 98  Weight: 198 lb 12.8 oz (90.2 kg)  Height: 5\' 1"  (1.549 m)  Body mass index is 37.56 kg/m.        Physical Examination:   General appearance - well appearing, and in no distress  Mental status - alert, oriented to person, place, and time  Psych:  She has a normal mood and affect  Skin - warm and dry, normal color, no suspicious lesions noted  Chest - effort normal  Heart - heart rate normal  Breasts - breasts appear normal, no suspicious masses, no skin or nipple changes or  axillary nodes  Abdomen - soft, nontender, nondistended, no masses or organomegaly  Pelvic - VULVA: normal appearing vulva with no masses, tenderness or lesions  VAGINA: normal appearing  vagina with normal color and discharge, no lesions  CERVIX: normal appearing cervix without discharge or lesions, no CMT  Thin prep pap is done w/ HR HPV cotesting  UTERUS: uterus is felt to be normal size, shape, consistency and nontender   ADNEXA: No adnexal masses or tenderness noted.  Extremities:  No swelling or varicosities noted  No results found for this or any previous visit (from the past 24 hour(s)).  Assessment & Plan:  1) Well-Woman breast Exam and pap smear only  2) Chronic vaginal irritation> refilled metrogel to use q hs prn  Labs/procedures today: pap  Mammogram @40yo  or sooner if problems Colonoscopy @39yo  or sooner if problems  No orders of the defined types were placed in this encounter.   Follow-up: Return in about 1 year (around 12/28/2018) for Physical.  Marge DuncansBooker, Jasneet Schobert Randall CNM, [facility] SouthWHNP-BC 12/28/2017 4:32 PM

## 2017-12-30 ENCOUNTER — Inpatient Hospital Stay: Admission: RE | Admit: 2017-12-30 | Payer: Self-pay | Source: Ambulatory Visit

## 2018-01-02 LAB — CYTOLOGY - PAP
Diagnosis: NEGATIVE
HPV: NOT DETECTED

## 2018-01-06 ENCOUNTER — Ambulatory Visit
Admission: RE | Admit: 2018-01-06 | Discharge: 2018-01-06 | Disposition: A | Discharge status: Home / Self Care | Payer: BC Managed Care – PPO | Source: Ambulatory Visit | Attending: Physician Assistant | Admitting: Physician Assistant

## 2018-01-06 DIAGNOSIS — K769 Liver disease, unspecified: Secondary | ICD-10-CM

## 2018-01-06 MED ORDER — GADOBENATE DIMEGLUMINE 529 MG/ML IV SOLN
20.0000 mL | Freq: Once | INTRAVENOUS | Status: AC | PRN
  Administered 2018-01-06: 20 mL via INTRAVENOUS

## 2018-01-08 ENCOUNTER — Encounter: Payer: Self-pay | Admitting: Physician Assistant

## 2018-01-08 ENCOUNTER — Telehealth: Payer: Self-pay | Admitting: Obstetrics & Gynecology

## 2018-01-08 NOTE — Telephone Encounter (Signed)
Pt called asking about results of PAP. DOB verified. Informed pt that PAP was normal.

## 2018-01-16 ENCOUNTER — Encounter (HOSPITAL_COMMUNITY): Payer: Self-pay | Admitting: Psychiatry

## 2018-01-16 ENCOUNTER — Encounter: Payer: Self-pay | Admitting: Neurology

## 2018-01-16 ENCOUNTER — Ambulatory Visit: Payer: BC Managed Care – PPO | Admitting: Neurology

## 2018-01-16 ENCOUNTER — Ambulatory Visit (INDEPENDENT_AMBULATORY_CARE_PROVIDER_SITE_OTHER): Payer: BC Managed Care – PPO | Admitting: Psychiatry

## 2018-01-16 VITALS — BP 124/87 | HR 83 | Ht 61.0 in | Wt 197.0 lb

## 2018-01-16 DIAGNOSIS — G4719 Other hypersomnia: Secondary | ICD-10-CM

## 2018-01-16 DIAGNOSIS — E6609 Other obesity due to excess calories: Secondary | ICD-10-CM

## 2018-01-16 DIAGNOSIS — R0683 Snoring: Secondary | ICD-10-CM

## 2018-01-16 DIAGNOSIS — Z818 Family history of other mental and behavioral disorders: Secondary | ICD-10-CM

## 2018-01-16 DIAGNOSIS — G4753 Recurrent isolated sleep paralysis: Secondary | ICD-10-CM

## 2018-01-16 DIAGNOSIS — G4730 Sleep apnea, unspecified: Secondary | ICD-10-CM

## 2018-01-16 DIAGNOSIS — F411 Generalized anxiety disorder: Secondary | ICD-10-CM

## 2018-01-16 DIAGNOSIS — Z811 Family history of alcohol abuse and dependence: Secondary | ICD-10-CM

## 2018-01-16 DIAGNOSIS — F331 Major depressive disorder, recurrent, moderate: Secondary | ICD-10-CM

## 2018-01-16 DIAGNOSIS — G4710 Hypersomnia, unspecified: Secondary | ICD-10-CM | POA: Diagnosis not present

## 2018-01-16 DIAGNOSIS — Z79899 Other long term (current) drug therapy: Secondary | ICD-10-CM

## 2018-01-16 DIAGNOSIS — Z6837 Body mass index (BMI) 37.0-37.9, adult: Secondary | ICD-10-CM | POA: Diagnosis not present

## 2018-01-16 DIAGNOSIS — R5383 Other fatigue: Secondary | ICD-10-CM | POA: Diagnosis not present

## 2018-01-16 MED ORDER — LURASIDONE HCL 40 MG PO TABS: 40.0000 mg | ORAL_TABLET | Freq: Every day | ORAL | 2 refills | Status: DC

## 2018-01-16 MED ORDER — DESVENLAFAXINE SUCCINATE ER 100 MG PO TB24: 100.0000 mg | ORAL_TABLET | Freq: Every day | ORAL | 2 refills | Status: DC

## 2018-01-16 NOTE — Patient Instructions (Signed)
I believe you may have a condition called narcolepsy: This means, that you have a sleep disorder that manifests with at times severe excessive sleepiness during the day and often with problems with sleep at night. We may have to try different medications that may help you stay awake during the day. Not everything works with everybody the same way. Wake promoting agents include stimulants and non-stimulant type medications. The most common side effects with stimulants are weight loss, insomnia, nervousness, headaches, palpitations, rise in blood pressure, anxiety. Stimulants can be addictive and subject to abuse. Non-stimulant type wake promoting medications include Provigil and Nuvigil, most common side effects include headaches, nervousness, insomnia, hypertension. In addition there is a medication called [name] which has been proven to be very effective in patients with narcolepsy with or without cataplexy. Some patients with narcolepsy report episodes of weakness, such as jaw or facial weakness, legs giving out, feeling wobbly or like "Jell-o", etc. in situations of anxiety, stress, laughter, sudden sadness, surprise, etc., which is called cataplexy. You can also experience episodes of sleep paralysis during which you may feel unable to move upon awakening. Some people experience dreamlike sequences upon awakening or upon drifting off to sleep, called hypnopompic or hypnagogic hallucinations.  I have ordered a PSG Split to look for apnea, if we don't find an organic sleep disorder we may need further work up for narcolepsy.   Melvyn Novasarmen Khala Tarte, MD

## 2018-01-16 NOTE — Progress Notes (Signed)
BH MD/PA/NP OP Progress Note  01/16/2018 4:41 PM Lauren [name]  MRN:  [id number]  Chief Complaint: I am very tired.  I have no energy.  I am sleeping too much and taking naps during the day.  HPI: Patient came for her follow-up appointment.  She is taking Jordan and Pristiq.  Overall she describes her mood is okay and her depression is a stable but she gets easily tired and exhausted.  She endorsed taking frequent naps during the day because she has no energy.  Recently her primary care physician recommended to have sleep study and today she saw the neurologist for sleep study.  She is waiting for the results.  Patient wants to continue Jordan and Pristiq because she believe it is helping her depression.  However she also looking for a new job because sometimes she gets overwhelmed with the people.  She started seeing therapist and she liked it.  Her appetite is okay.  She denies any feeling of hopelessness or worthlessness.  She denies any suicidal thoughts or homicidal thought.  She has no tremors, shakes or any EPS.  Visit Diagnosis:    ICD-10-CM   1. Major depressive disorder, recurrent episode, moderate (HCC) F33.1 desvenlafaxine (PRISTIQ) 100 MG 24 hr tablet    lurasidone (LATUDA) 40 MG TABS tablet    DISCONTINUED: lurasidone (LATUDA) 40 MG TABS tablet    Past Psychiatric History: Reviewed. She is taking antidepressant from primary care physiciansince 2002 when she was in college. At that time she remembered feeling isolated, withdrawn, having crying spells and decreased energy. She had tried Effexor and Prozac with good response by her primary care physician. She also remembered taking Depakote, Zoloft, Celexa, Cymbalta, Wellbutrin and recently Brintellixand Prozac. Patient was recently given Lamictal with good response until she developed rash.also tried [name] which work very well but recently complaining of feeling tired and it was switched to Jordan.  Patient denies any history of  abuse in the past.   Past Medical History:  Past Medical History:  Diagnosis Date  . Anemia   . Anxiety   . Constipation   . Depression   . Fibromyalgia   . GERD (gastroesophageal reflux disease)   . Hemorrhoids   . HSV-1 infection   . Hypertension   . Hypothyroidism   . Other abnormal glucose   . Preeclampsia     Past Surgical History:  Procedure Laterality Date  . CHOLECYSTECTOMY  2006  . TONSILLECTOMY    . WISDOM TOOTH EXTRACTION      Family Psychiatric History:.  Family History:  Family History  Problem Relation Age of Onset  . Hypothyroidism Mother   . Cervical cancer Mother   . Bipolar disorder Mother   . Diabetes Mother   . Colon polyps Mother   . Bipolar disorder Brother   . Kidney disease Brother   . Alcohol abuse Brother   . Hypothyroidism Daughter   . Heart disease Maternal Grandmother   . Diabetes Maternal Grandmother   . Cancer Maternal Grandmother        SKIN  . Hyperlipidemia Maternal Grandmother   . Hypertension Maternal Grandmother   . Hypothyroidism Maternal Grandmother   . Lung cancer Maternal Grandfather   . Heart disease Maternal Aunt   . Diabetes Maternal Aunt   . Diabetes Cousin   . Colon cancer Neg Hx     Social History:  Social History   Socioeconomic History  . Marital status: Married    Spouse name: None  .  Number of children: 2  . Years of education: None  . Highest education level: None  Social Needs  . Financial resource strain: None  . Food insecurity - worry: None  . Food insecurity - inability: None  . Transportation needs - medical: None  . Transportation needs - non-medical: None  Occupational History  . Occupation: Magazine features editorTEACHER    Employer: [employer]  Tobacco Use  . Smoking status: Never Smoker  . Smokeless tobacco: Never Used  Substance and Sexual Activity  . Alcohol use: No    Alcohol/week: 0.0 oz    Comment: occ  . Drug use: No  . Sexual activity: Yes    Partners: Male    Birth  control/protection: None    Comment: husband has had a vasectomy  Other Topics Concern  . None  Social History Narrative   4 caffeine drinks daily     Allergies:  Allergies  Allergen Reactions  . Lamictal [Lamotrigine] Rash  . Doxycycline Other (See Comments)    GI UPSET  . Effexor [Venlafaxine] Other (See Comments)    DYSPHORIA  . Prednisone Other (See Comments)    FLUSHED  . Savella [Milnacipran Hcl] Other (See Comments)    NO RELIEF    Metabolic Disorder Labs: Lab Results  Component Value Date   HGBA1C 5.5 03/08/2017   MPG 111 03/08/2017   MPG 111 06/16/2016   No results found for: PROLACTIN Lab Results  Component Value Date   CHOL 170 03/08/2017   TRIG 169 (H) 03/08/2017   HDL 44 (L) 03/08/2017   CHOLHDL 3.9 03/08/2017   VLDL 34 (H) 03/08/2017   LDLCALC 92 03/08/2017   LDLCALC 94 06/16/2016   Lab Results  Component Value Date   TSH 0.47 12/21/2017   TSH 0.93 03/08/2017    Therapeutic Level Labs: No results found for: LITHIUM No results found for: VALPROATE No components found for:  CBMZ  Current Medications: Current Outpatient Medications  Medication Sig Dispense Refill  . ALPRAZolam (XANAX) 0.5 MG tablet Take by mouth.    . Cholecalciferol (VITAMIN D) 2000 UNITS CAPS Take 8,000 Units by mouth daily.     Marland Kitchen. desvenlafaxine (PRISTIQ) 100 MG 24 hr tablet Take 1 tablet (100 mg total) by mouth daily. 30 tablet 2  . Ferrous Sulfate (IRON) 325 (65 Fe) MG TABS Take by mouth 2 (two) times daily.    Marland Kitchen. ibuprofen (ADVIL,MOTRIN) 200 MG tablet Take 200 mg by mouth every 6 (six) hours as needed for pain. Takes 4 tablets (800 mg) every 4-6 hrs prn    . lurasidone (LATUDA) 40 MG TABS tablet Take 1 tablet (40 mg total) by mouth daily with breakfast. 30 tablet 2  . metroNIDAZOLE (METROGEL VAGINAL) 0.75 % vaginal gel Place 1 Applicatorful vaginally at bedtime as needed. no alcohol or sex while using 70 g 0  . pravastatin (PRAVACHOL) 40 MG tablet TAKE 1 TABLET BY MOUTH  DAILY 90 tablet 1  . SYNTHROID 150 MCG tablet TAKE 1 TABLET BY MOUTH EVERY DAY BEFORE BREAKFAST 90 tablet 1  . vitamin E (VITAMIN E) 400 UNIT capsule Take 800 Units by mouth daily.     No current facility-administered medications for this visit.      Musculoskeletal: Strength & Muscle Tone: within normal limits Gait & Station: normal Patient leans: N/A  Psychiatric Specialty Exam: ROS  Blood pressure 126/84, pulse 88, height 5' 1.5" (1.562 m), weight 197 lb (89.4 kg), SpO2 98 %.Body mass index is 36.62 kg/m.  General  Appearance: Casual  Eye Contact:  Good  Speech:  Clear and Coherent  Volume:  Normal  Mood:  Dysphoric and tired  Affect:  Constricted  Thought Process:  Goal Directed  Orientation:  Full (Time, Place, and Person)  Thought Content: Rumination   Suicidal Thoughts:  No  Homicidal Thoughts:  No  Memory:  Immediate;   Good Recent;   Good Remote;   Good  Judgement:  Good  Insight:  Good  Psychomotor Activity:  Decreased  Concentration:  Concentration: Fair and Attention Span: Fair  Recall:  Good  Fund of Knowledge: Good  Language: Good  Akathisia:  No  Handed:  Right  AIMS (if indicated): not done  Assets:  Communication Skills Desire for Improvement Housing Resilience  ADL's:  Intact  Cognition: WNL  Sleep:  too much   Screenings: PHQ2-9     Office Visit from 12/28/2017 in [facility]  PHQ-2 Total Score  3  PHQ-9 Total Score  13       Assessment and Plan: Major depressive disorder, recurrent.  Generalized anxiety disorder.  Patient like to keep her current psychiatric medication.  She also hoping that sleep study shows narcolepsy or sleep apnea.  Continue Latuda 40 mg daily and Pristiq 100 mg daily.  Encouraged to keep appointment with a therapist for CBT.  Recommended to call us back if she has any question or any concern.  Follow-up in 3 months.   Cleotis Nipper, MD 01/16/2018, 4:41 PM

## 2018-01-16 NOTE — Progress Notes (Signed)
SLEEP MEDICINE CLINIC   Provider:  Melvyn Novas, M D  Primary Care Physician:  Lucky Cowboy, MD   Referring Provider: Lucky Cowboy, MD   Chief Complaint  Patient presents with  . New Patient (Initial Visit)    pt states that she is extremely tired all the time. pt had a sleep study apx 6 years ago. pt states she snores.     HPI:  Lauren [name] is a 37 y.o. female , seen here as in a referral from Dr. Oneta Rack for a sleep evaluation. I had the pleasure of meeting with Lauren [name] before about 6-1/2 years ago she had undergone a sleep study at [facility] sleep.  She has been followed by Dr. Anne Hahn at the time who also ordered a sleep study.  In the meantime she has been followed by her primary care physician, and has seen Dr. Lolly Mustache.  She has a diagnosis of hypothyroidism, fibromyalgia, excessive fatigue.  She reports that she had been under more stress at work and at home.  The patient is also a mother of 2 with her youngest son will be 31 years old.  After the birth of either she experienced temporary spells of weakness.  With each of the deliveries she had received magnesium, which can be the most likely cause for perinatal weakness.  Chief complaint according to patient : "depression, leaden fatigue, weight gain, and myalgia" I sometimes feel it coming down on me and I just need to sleep, have taken 2 naps while waiting here."  Sleep habits are as follows: The patient usually goes to bed around 8:30 PM, she takes Xanax at bedtime and is promptly asleep within 20-30 minutes.  The bedroom is cool, quiet and dark.  She prefers to sleep on her side or prone , and sleeps on one pillow.  I sleeps through the night, wakes up at 6 AM, on weekends she stays much longer in bed -until 10 AM. She estimates 9 hours of sleep on average. She wakes without headaches or dry mouth. She dreams a lot, has one or none bathroom break. Her husband noted her just to have started to snore, and  suspected apnea.  She naps frequently in daytime. She works as a Runner, broadcasting/film/video in [employer], product/process development. Her naps are 3-5 minutes long.  Excessive daytime sleepiness and fatigue, 3-5 minute power naps, wakes up from her own snoring. Sleep paralysis in the past, none recent. Immediate dreams when napping. She cannot recall cataplectic spells.    Sleep medical history and family sleep history:  Excessive daytime sleepiness and fatigue, 3-5 minute power naps, wakes up from her own snoring. Mother is sleepy, sons are not.   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 Daughters  age 86  and 59. No smoking history, No ETOH, caffeine - two a day.   Review of Systems: Out of a complete 14 system review, the patient complains of only the following symptoms, and all other reviewed systems are negative.  snoring, weight gain, fatigued.   Epworth score  11 - but with naps and sleep attacks.  , Fatigue severity score 60  , depression score n/a -    Social History   Socioeconomic History  . Marital status: Married    Spouse name: Not on file  . Number of children: 2  . Years of education: Not on file  . Highest education level: Not on file  Social Needs  . Financial resource strain: Not  on file  . Food insecurity - worry: Not on file  . Food insecurity - inability: Not on file  . Transportation needs - medical: Not on file  . Transportation needs - non-medical: Not on file  Occupational History  . Occupation: Magazine features editor: [employer]  Tobacco Use  . Smoking status: Never Smoker  . Smokeless tobacco: Never Used  Substance and Sexual Activity  . Alcohol use: No    Alcohol/week: 0.0 oz    Comment: occ  . Drug use: No  . Sexual activity: Yes    Partners: Male    Birth control/protection: None    Comment: husband has had a vasectomy  Other Topics Concern  . Not on file  Social History Narrative   4 caffeine drinks daily     Family History  Problem  Relation Age of Onset  . Hypothyroidism Mother   . Cervical cancer Mother   . Bipolar disorder Mother   . Diabetes Mother   . Colon polyps Mother   . Bipolar disorder Brother   . Kidney disease Brother   . Alcohol abuse Brother   . Hypothyroidism Daughter   . Heart disease Maternal Grandmother   . Diabetes Maternal Grandmother   . Cancer Maternal Grandmother        SKIN  . Hyperlipidemia Maternal Grandmother   . Hypertension Maternal Grandmother   . Hypothyroidism Maternal Grandmother   . Lung cancer Maternal Grandfather   . Heart disease Maternal Aunt   . Diabetes Maternal Aunt   . Diabetes Cousin   . Colon cancer Neg Hx     Past Medical History:  Diagnosis Date  . Anemia   . Anxiety   . Constipation   . Depression   . Fibromyalgia   . GERD (gastroesophageal reflux disease)   . Hemorrhoids   . HSV-1 infection   . Hypertension   . Hypothyroidism   . Other abnormal glucose   . Preeclampsia     Past Surgical History:  Procedure Laterality Date  . CHOLECYSTECTOMY  2006  . TONSILLECTOMY    . WISDOM TOOTH EXTRACTION      Current Outpatient Medications  Medication Sig Dispense Refill  . ALPRAZolam (XANAX) 0.5 MG tablet Take by mouth.    . Cholecalciferol (VITAMIN D) 2000 UNITS CAPS Take 8,000 Units by mouth daily.     Marland Kitchen desvenlafaxine (PRISTIQ) 100 MG 24 hr tablet Take 1 tablet (100 mg total) by mouth daily. 30 tablet 2  . Ferrous Sulfate (IRON) 325 (65 Fe) MG TABS Take by mouth 2 (two) times daily.    Marland Kitchen ibuprofen (ADVIL,MOTRIN) 200 MG tablet Take 200 mg by mouth every 6 (six) hours as needed for pain. Takes 4 tablets (800 mg) every 4-6 hrs prn    . lurasidone (LATUDA) 40 MG TABS tablet Take 1 tablet (40 mg total) by mouth daily with breakfast. 30 tablet 2  . metroNIDAZOLE (METROGEL VAGINAL) 0.75 % vaginal gel Place 1 Applicatorful vaginally at bedtime as needed. no alcohol or sex while using 70 g 0  . pravastatin (PRAVACHOL) 40 MG tablet TAKE 1 TABLET BY MOUTH DAILY  90 tablet 1  . SYNTHROID 150 MCG tablet TAKE 1 TABLET BY MOUTH EVERY DAY BEFORE BREAKFAST 90 tablet 1  . vitamin E (VITAMIN E) 400 UNIT capsule Take 800 Units by mouth daily.     No current facility-administered medications for this visit.     Allergies as of 01/16/2018 - Review Complete 12/28/2017  Allergen Reaction Noted  . Lamictal [lamotrigine] Rash 02/11/2016  . Doxycycline Other (See Comments) 09/19/2013  . Effexor [venlafaxine] Other (See Comments) 09/19/2013  . Prednisone Other (See Comments) 09/19/2013  . Savella [milnacipran hcl] Other (See Comments) 09/19/2013    Vitals: BP 124/87   Pulse 83   Ht 5\' 1"  (1.549 m)   Wt 197 lb (89.4 kg)   BMI 37.22 kg/m  Last Weight:  Wt Readings from Last 1 Encounters:  01/16/18 197 lb (89.4 kg)   ZOX:WRUEBMI:Body mass index is 37.22 kg/m.     Last Height:   Ht Readings from Last 1 Encounters:  01/16/18 5\' 1"  (1.549 m)    Physical exam:  General: The patient is awake, alert and appears not in acute distress. The patient is well groomed. Head: Normocephalic, atraumatic. Neck is supple. Mallampati 5 and pale mucosa. ,  neck circumference: 15,5 " Nasal airflow patent , Retrognathia is not seen. Wore braces and retainer, now bruxism mouth guard.  Cardiovascular:  Regular rate and rhythm, without  murmurs or carotid bruit, and without distended neck veins. Respiratory: Lungs are clear to auscultation. Skin:  Without evidence of edema, or rash Trunk: BMI is 37 .2 Neurologic exam : The patient is awake and alert, oriented to place and time.     Attention span & concentration ability appears normal.  Speech is slowed but  fluent, meek-  without dysarthria, dysphonia or aphasia.  Mood and affect are appropriate.  Cranial nerves: Intact sense of taste and smell. Pupils are equal and briskly reactive to light. Funduscopic exam without evidence of pallor or edema. Extraocular movements  in vertical and horizontal planes intact and without  nystagmus. Visual fields by finger perimetry are intact. Hearing to finger rub intact. Facial sensation intact to fine touch. Facial motor strength is symmetric and tongue and uvula move midline. Shoulder shrug was symmetrical.  Motor exam:  Normal tone, muscle bulk and symmetric strength in all extremities. Sensory:  Fine touch, pinprick and vibration were tested in all extremities. Proprioception tested in the upper extremities was normal. Coordination: Rapid alternating movements in the fingers/hands was normal. Finger-to-nose maneuver  normal without evidence of ataxia, dysmetria or tremor. Gait and station: Patient walks without assistive device . Turns with 3 Steps.  Deep tendon reflexes: in the  upper and lower extremities are symmetric and intact. Babinski maneuver response is downgoing   Assessment:  After physical and neurologic examination, review of laboratory studies,  Personal review of imaging studies, reports of other /same  Imaging studies, results of polysomnography and / or neurophysiology testing and pre-existing records as far as provided in visit., my assessment is ;  Mrs. Vollmer had a period of insomnia when her husband snored, who now is on CPAP. She has become more fatigued as more she sleeps, and still is excessively daytime sleepy.   1) snoring, possible OSA -Mrs. Okey DupreCrawford has several risk factors for the presence of obstructive sleep apnea, her neck circumference approaches 16 inches, she has a high-grade narrowing of the upper airway, her mucous lining in her mouth appears pale, she also has bruxism.  She does not present with red prognathia.  Her BMI is above 35 and will be categorized as morbid obesity, a further risk factor for the presence of sleep apnea.    But I will order a sleep study to evaluate her for the presence of sleep apnea and will be able to treat her soon , there is a degree of fatigue that  may not be related to an organic sleep disorder.  The patient  describes a much higher degree of fatigue than actually of sleepiness.  However in spite of that rather low which Epworth score of 11 points she does struggle with daytime alertness, and she takes power naps from some she wakes up snoring.  A rheumatological workup has thus far been negative.  She has not tested positive by ANA, Anka, Sjogren's or sed rate. She believes she has Fibromyalgia. She is followed by Dr. Lolly Mustache , psychiatry. She no longer sees Dr Anne Hahn.   The patient was advised of the nature of the diagnosed disorder , the treatment options and the  risks for general health and wellness arising from not treating the condition.   I spent more than 45  minutes of face to face time with the patient.  Greater than 50% of time was spent in counseling and coordination of care. We have discussed the diagnosis and differential and I answered the patient's questions.    Plan:  Treatment plan and additional workup :  SPLIT night PSG , at [facility], RV after wards.    Melvyn Novas, MD 01/16/2018, 1:13 PM  Certified in Neurology by ABPN Certified in Sleep Medicine by [facility] [address], [address] [place], [place] [address]

## 2018-01-19 ENCOUNTER — Ambulatory Visit (INDEPENDENT_AMBULATORY_CARE_PROVIDER_SITE_OTHER): Payer: BC Managed Care – PPO | Admitting: Neurology

## 2018-01-19 ENCOUNTER — Telehealth: Payer: Self-pay | Admitting: Neurology

## 2018-01-19 DIAGNOSIS — G4753 Recurrent isolated sleep paralysis: Secondary | ICD-10-CM

## 2018-01-19 DIAGNOSIS — G4730 Sleep apnea, unspecified: Secondary | ICD-10-CM

## 2018-01-19 DIAGNOSIS — R0683 Snoring: Secondary | ICD-10-CM

## 2018-01-19 DIAGNOSIS — E6609 Other obesity due to excess calories: Secondary | ICD-10-CM

## 2018-01-19 DIAGNOSIS — G4710 Hypersomnia, unspecified: Secondary | ICD-10-CM

## 2018-01-19 DIAGNOSIS — Z6837 Body mass index (BMI) 37.0-37.9, adult: Secondary | ICD-10-CM

## 2018-01-19 DIAGNOSIS — G4719 Other hypersomnia: Secondary | ICD-10-CM

## 2018-01-19 NOTE — Telephone Encounter (Signed)
Labs collected on 01/16/18, not resulted yet.  Called patient and informed her that labs have not resulted and office will call as soon as they are available and have been reviewed by MD. She verbalized understanding and appreciation.

## 2018-01-19 NOTE — Telephone Encounter (Signed)
Pt request lab results °

## 2018-01-22 NOTE — Telephone Encounter (Signed)
Pt saw lab results on mychart and would like to discuss please

## 2018-01-22 NOTE — Telephone Encounter (Signed)
Called the patient to go over the sleep study results. No answer lvm with information below.  If patient calls back please make her aware that the sleep study did not show any evidence of sleep apnea, no low oxygen issues as well as no restlessness in her extremities. Her lab work has not resulted yet I will call her once those are posted.

## 2018-01-22 NOTE — Procedures (Signed)
PATIENT'S NAME:  Lauren [name], [name] DOB:      December 20, 1980      MR#:    [id number]     DATE OF RECORDING: 01/19/2018 REFERRING M.D.:  Lucky CowboyWilliam McKeown, M.D. Study Performed:   Baseline Polysomnogram HISTORY:  Lauren PettiesMandy E. [name] is a 37 year old female, seen here as in a referral from Dr. Oneta RackMcKeown for a sleep evaluation for EDS ( excessive daytime sleepiness) and high fatigue and had 6-1/2 years ago undergone a sleep study at [facility].  She has been followed by Dr. Anne HahnWillis, by her primary care physician, and has seen Dr. Lolly MustacheArfeen. She has a diagnosis of hypothyroidism, fibromyalgia, excessive fatigue.  She reports that she had been under more stress at work and at home. The patient is a mother of 2 with her youngest son will be 39 years old.  After the birth of either she experienced temporary spells of weakness.  With each of the deliveries she had received magnesium, which can be the most likely cause for perinatal weakness. The patient endorsed the Epworth Sleepiness Scale at 11/24 points, but with daily naps. FSS 60 points!   The patient's weight 196 pounds with a height of 61 (inches), resulting in a BMI of 37.0 kg/ m2. The patient's neck circumference measured 15 inches.  CURRENT MEDICATIONS: Xanax, Pristiq, Advil, Latuda, Pravachol, Synthroid   PROCEDURE:  This is a multichannel digital polysomnogram utilizing the Somnostar 11.2 system.  Electrodes and sensors were applied and monitored per AASM Specifications.   EEG, EOG, Chin and Limb EMG, were sampled at 200 Hz.  ECG, Snore and Nasal Pressure, Thermal Airflow, Respiratory Effort, CPAP Flow and Pressure, Oximetry was sampled at 50 Hz. Digital video and audio were recorded.      BASELINE STUDY: Lights Out was at 22:28 and Lights On at 05:01.  Total recording time (TRT) was 393 minutes, with a total sleep time (TST) of 291.5 minutes. The patient's sleep latency was 33 minutes.  REM latency was 273 minutes.  The sleep efficiency was 74.2 %.     SLEEP  ARCHITECTURE: WASO (Wake after sleep onset) was 70.5 minutes.  There were 17.5 minutes in Stage N1, 124.5 minutes Stage N2, 109.5 minutes Stage N3 and 40 minutes in Stage REM.  The percentage of Stage N1 was 6.%, Stage N2 was 42.7%, Stage N3 was 37.6% and Stage R (REM sleep) was 13.7%.   RESPIRATORY ANALYSIS:  There were a total of 15 respiratory events:  0 apneas and 15 hypopneas with 0 respiratory event related arousals (RERAs). The total APNEA/HYPOPNEA INDEX (AHI) was 3.1/hour and the total RESPIRATORY DISTURBANCE INDEX was 3.1 /hour. 5 events occurred in REM sleep and 20 events in NREM. The REM AHI was 7.5 /hour, versus a non-REM AHI of 2.4. The patient spent 145 minutes of total sleep time in the supine position and 147 minutes in non-supine. The supine AHI was 4.1/hr. versus a non-supine AHI of 2.0.  OXYGEN SATURATION & C02:  The Wake baseline 02 saturation was 98%, with the lowest being 90%.    PERIODIC LIMB MOVEMENTS:  The patient had a total of 0 Periodic Limb Movements. The arousals were noted as: 26 were spontaneous, 0 were associated with PLMs, and 6 were associated with respiratory events. Audio and video analysis did not show any abnormal or unusual movements, behaviors, phonations or vocalizations. The patient took bathroom breaks. Snoring was noted. EKG was in keeping with normal sinus rhythm (NSR). Post-study, the patient indicated that sleep was the  same as usual.   IMPRESSION: No evidence of clinical significant Obstructive Sleep Apnea (OSA), Hypoxemia, and no Periodic Limb Movement Disorder (PLMD).  1. Primary Snoring between mild and loud, dependent on position.  2. Poor sleep efficiency unrelated to physiological factors.   RECOMMENDATIONS:  1. Consider dedicated sleep psychology / psychiatry follow up for insomnia concern.  2. Please refer to Pain management if pain is interrupting sleep.  3. A follow up appointment is optional and can be scheduled in the Sleep Clinic at  [facility]. The referring provider will be notified of the results.      I certify that I have reviewed the entire raw data recording prior to the issuance of this report in accordance with the Standards of Accreditation of the American Academy of Sleep Medicine (AASM)      Melvyn Novas, MD       01-21-2018  Diplomat, American Board of Psychiatry and Neurology  Diplomat, American Board of Sleep Medicine Medical Director, [facility] [employer]

## 2018-01-22 NOTE — Telephone Encounter (Signed)
Pt returning RN call is aware of what has been noted.

## 2018-01-24 LAB — NARCOLEPSY EVALUATION
DQB1*06:02: POSITIVE
HLA-DQ ALPHA: POSITIVE

## 2018-01-25 ENCOUNTER — Telehealth: Payer: Self-pay | Admitting: Neurology

## 2018-01-25 ENCOUNTER — Other Ambulatory Visit: Payer: Self-pay | Admitting: Neurology

## 2018-01-25 DIAGNOSIS — G4719 Other hypersomnia: Secondary | ICD-10-CM

## 2018-01-25 NOTE — Telephone Encounter (Signed)
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 The initial sleep study that was completed on 3/1 showed no evidence of sleep apnea which is what Dr Vickey Hugerohmeier thought was the cause of her daytime sleepiness. I informed her that this test would mean that she would have to come in for another sleep test over night and I went over that she would also have to stay and do nap testing the next day. The patient verbalized understanding. I went over her medications and asked about her xanax medication and she does take that at night I have informed her that in order for this test to be a valid test she would need to wean and stop that mediciation at least 14 days prior to her sleep study. Pt verbalized understanding.

## 2018-01-25 NOTE — Telephone Encounter (Signed)
-----   Message from Melvyn Novasarmen Dohmeier, MD sent at 01/24/2018  5:20 PM EST ----- WOW - both factors positive , please share with patient! We should get narcolepsy treatment approved

## 2018-02-02 ENCOUNTER — Other Ambulatory Visit: Payer: Self-pay | Admitting: Internal Medicine

## 2018-02-21 ENCOUNTER — Other Ambulatory Visit: Payer: Self-pay | Admitting: Internal Medicine

## 2018-02-26 ENCOUNTER — Ambulatory Visit (INDEPENDENT_AMBULATORY_CARE_PROVIDER_SITE_OTHER): Payer: BC Managed Care – PPO | Admitting: Neurology

## 2018-02-26 DIAGNOSIS — G4719 Other hypersomnia: Secondary | ICD-10-CM

## 2018-02-26 DIAGNOSIS — G4710 Hypersomnia, unspecified: Secondary | ICD-10-CM

## 2018-02-27 ENCOUNTER — Telehealth: Payer: Self-pay | Admitting: Neurology

## 2018-02-27 ENCOUNTER — Encounter (HOSPITAL_COMMUNITY): Payer: Self-pay | Admitting: Psychiatry

## 2018-02-27 ENCOUNTER — Ambulatory Visit (HOSPITAL_COMMUNITY): Payer: BC Managed Care – PPO | Admitting: Psychiatry

## 2018-02-27 VITALS — BP 129/92 | HR 90 | Ht 61.0 in | Wt 195.2 lb

## 2018-02-27 DIAGNOSIS — G4733 Obstructive sleep apnea (adult) (pediatric): Secondary | ICD-10-CM

## 2018-02-27 DIAGNOSIS — F411 Generalized anxiety disorder: Secondary | ICD-10-CM

## 2018-02-27 DIAGNOSIS — Z818 Family history of other mental and behavioral disorders: Secondary | ICD-10-CM | POA: Diagnosis not present

## 2018-02-27 DIAGNOSIS — F331 Major depressive disorder, recurrent, moderate: Secondary | ICD-10-CM

## 2018-02-27 DIAGNOSIS — Z811 Family history of alcohol abuse and dependence: Secondary | ICD-10-CM | POA: Diagnosis not present

## 2018-02-27 MED ORDER — ARIPIPRAZOLE 10 MG PO TABS: ORAL_TABLET | ORAL | 0 refills | Status: DC

## 2018-02-27 NOTE — Telephone Encounter (Signed)
Sleep technician cancelled the study than run it- not sure what made her do it. However , I cannot close the study now. I have only the option of sending the result to my RN by a phone note as a result note cannot be generated. CD  PATIENT'S NAME:  Lauren [name], Lauren [name] DOB:      1981-07-02      MR#:    [id number]     DATE OF RECORDING: 02/26/2018 REFERRING M.D.:  Lucky CowboyWilliam McKeown, M.D. Study Performed:   Baseline Polysomnogram HISTORY:  Pt was once before referred to a sleep study by Dr. Anne HahnWillis, now returning per PCP for excessive daytime sleepiness and fatigue work up.  This PSG is a baseline study prior to MSLT study.  Pt was previously tested on 01-19-2018 and that PSG was negative for sleep apnea, but was not followed by MSLT.  The patient endorsed the Epworth Sleepiness Scale now at 11 points, with sleep attacks and naps.   The patient's weight 196 pounds with a height of 61 (inches), resulting in a BMI of 37.1 kg/m2. The patient's neck circumference measured 15.5 inches.  CURRENT MEDICATIONS: Xanax, Pristiq, Advil, Latuda, Pravachol, Synthroid   PROCEDURE:  This is a multichannel digital polysomnogram utilizing the SomnoStar 11.2 system.  Electrodes and sensors were applied and monitored per AASM Specifications.   EEG, EOG, Chin and Limb EMG, were sampled at 200 Hz.  ECG, Snore and Nasal Pressure, Thermal Airflow, Respiratory Effort, CPAP Flow and Pressure, Oximetry was sampled at 50 Hz. Digital video and audio were recorded.      BASELINE STUDY: Lights Out was at 21:14 and Lights On at 05:13.  Total recording time (TRT) was 480 minutes, with a total sleep time (TST) of 419 minutes.   The patient's sleep latency was 27 minutes.  REM latency was 175.5 minutes.  The sleep efficiency was 87.3 %.     SLEEP ARCHITECTURE: WASO (Wake after sleep onset) was 34 minutes.  There were 34.5 minutes in Stage N1, 162.5 minutes Stage N2, 128.5 minutes Stage N3 and 93.5 minutes in Stage REM.  The percentage of Stage  N1 was 8.2%, Stage N2 was 38.8%, Stage N3 was 30.7% and Stage R (REM sleep) was 22.3%.    RESPIRATORY ANALYSIS:  There were a total of 69 respiratory events:  2 obstructive apneas, 0 central apneas and 1 mixed apneas with a total of 3 apneas and an apnea index (AI) of .4 /hour. There were 66 hypopneas with a hypopnea index of 9.5 /hour. The patient also had 0 respiratory event related arousals (RERAs).      The total APNEA/HYPOPNEA INDEX (AHI) was 9.9/hour and the total RESPIRATORY DISTURBANCE INDEX was 9.9 /hour.  25 events occurred in REM sleep and 85 events in NREM. The REM AHI was 16.0 /hour, versus a non-REM AHI of 8.1. The patient spent 258.5 minutes of total sleep time in the supine position and 161 minutes in non-supine. The supine AHI was 10.9 versus a non-supine AHI of 8.3/h.  OXYGEN SATURATION & C02:  The Wake baseline 02 saturation was 98%, with the lowest being 87%. Time spent below 89% saturation equaled 4 minutes.   PERIODIC LIMB MOVEMENTS:  The patient had a total of 0 Periodic Limb Movements.      IMPRESSION:  1. Mild Obstructive Sleep Apnea(OSA)  RECOMMENDATIONS: The MSLT was cancelled due to AHI > 5, patient has to treat OSA first. Her Epworth sore is not very high.  Recommend to treat with auto  CPAP 5-12 cm water, and re-evaluate with NP if Epworth and fatigue improving - after 60-90 days on CPAP.    1. A follow up appointment will be scheduled with a NP at [facility]. The referring provider will be notified of the results.      I certify that I have reviewed the entire raw data recording prior to the issuance of this report in accordance with the Standards of Accreditation of the American Academy of Sleep Medicine (AASM)      Melvyn Novas, MD    02-28-2018  Diplomat, American Board of Psychiatry and Neurology  Diplomat, American Board of Sleep Medicine Medical Director, [place] [facility]

## 2018-02-27 NOTE — Procedures (Unsigned)
PATIENT'S NAME:  Lauren [name], Bena DOB:      October 01, 1981      MR#:    [id number]     DATE OF RECORDING: 02/26/2018 REFERRING M.D.:  Lucky Cowboy, M.D. Study Performed:   Baseline Polysomnogram HISTORY:  Pt was once before referred to a sleep study by Dr. Anne Hahn, now returning per PCP for excessive daytime sleepiness and fatigue work up.  This PSG is a baseline study prior to MSLT study.  Pt was previously tested on 01-19-2018 and that PSG was negative for sleep apnea, but was not followed by MSLT.  The patient endorsed the Epworth Sleepiness Scale now at 11 points, with sleep attacks and naps.   The patient's weight 196 pounds with a height of 61 (inches), resulting in a BMI of 37.1 kg/m2. The patient's neck circumference measured 15.5 inches.  CURRENT MEDICATIONS: Xanax, Pristiq, Advil, Latuda, Pravachol, Synthroid   PROCEDURE:  This is a multichannel digital polysomnogram utilizing the SomnoStar 11.2 system.  Electrodes and sensors were applied and monitored per AASM Specifications.   EEG, EOG, Chin and Limb EMG, were sampled at 200 Hz.  ECG, Snore and Nasal Pressure, Thermal Airflow, Respiratory Effort, CPAP Flow and Pressure, Oximetry was sampled at 50 Hz. Digital video and audio were recorded.      BASELINE STUDY: Lights Out was at 21:14 and Lights On at 05:13.  Total recording time (TRT) was 480 minutes, with a total sleep time (TST) of 419 minutes.   The patient's sleep latency was 27 minutes.  REM latency was 175.5 minutes.  The sleep efficiency was 87.3 %.     SLEEP ARCHITECTURE: WASO (Wake after sleep onset) was 34 minutes.  There were 34.5 minutes in Stage N1, 162.5 minutes Stage N2, 128.5 minutes Stage N3 and 93.5 minutes in Stage REM.  The percentage of Stage N1 was 8.2%, Stage N2 was 38.8%, Stage N3 was 30.7% and Stage R (REM sleep) was 22.3%.    RESPIRATORY ANALYSIS:  There were a total of 69 respiratory events:  2 obstructive apneas, 0 central apneas and 1 mixed apneas with a total  of 3 apneas and an apnea index (AI) of .4 /hour. There were 66 hypopneas with a hypopnea index of 9.5 /hour. The patient also had 0 respiratory event related arousals (RERAs).      The total APNEA/HYPOPNEA INDEX (AHI) was 9.9/hour and the total RESPIRATORY DISTURBANCE INDEX was 9.9 /hour.  25 events occurred in REM sleep and 85 events in NREM. The REM AHI was 16.0 /hour, versus a non-REM AHI of 8.1. The patient spent 258.5 minutes of total sleep time in the supine position and 161 minutes in non-supine. The supine AHI was 10.9 versus a non-supine AHI of 8.3/h.  OXYGEN SATURATION & C02:  The Wake baseline 02 saturation was 98%, with the lowest being 87%. Time spent below 89% saturation equaled 4 minutes.   PERIODIC LIMB MOVEMENTS:  The patient had a total of 0 Periodic Limb Movements.      IMPRESSION:  1. Mild Obstructive Sleep Apnea(OSA)  RECOMMENDATIONS: The MSLT was cancelled due to AHI > 5, patient has to treat OSA first. Her Epworth sore is not very high.  Recommend to treat with auto CPAP 5-12 cm water, and re-evaluate with NP if Epworth and fatigue improving - after 60-90 days on CPAP.    1. A follow up appointment will be scheduled with a NP at [facility]. The referring provider will be notified of the results.  I certify that I have reviewed the entire raw data recording prior to the issuance of this report in accordance with the Standards of Accreditation of the American Academy of Sleep Medicine (AASM)      Melvyn Novasarmen Nicholas Ossa, MD    02-28-2018  Diplomat, American Board of Psychiatry and Neurology  Diplomat, American Board of Sleep Medicine Medical Director of [facility]

## 2018-02-27 NOTE — Telephone Encounter (Signed)
Called to review the sleep study results with the patient. She was in a doctor office apt and asked to call me back. The pt will call back.

## 2018-02-27 NOTE — Telephone Encounter (Signed)
Pt returned call, I was able to discuss her sleep study results. The pt came in for a sleep study and MSLT but was unable to complete the MSLT  Because of the night time study showing sleep apnea. This was the pt's 2nd night sleep study the 1st one did not show apnea. I reviewed all this in detail with the patient and explained the results of this new study.  Dr. Vickey Hugerohmeier recommends that pt starts auto CPAP. I reviewed PAP compliance expectations with the pt. Pt is agreeable to starting a CPAP. I advised pt that an order will be sent to a DME, [facility], and [facility] will call the pt within about one week after they file with the pt's insurance. [facility] will show the pt how to use the machine, fit for masks, and troubleshoot the CPAP if needed. A follow up appt was made for insurance purposes with Darrol Angelarolyn Martin on May 23, 2018 at 11:15 am. Pt verbalized understanding to arrive 15 minutes early and bring their CPAP. A letter with all of this information in it will be mailed to the pt as a reminder. I verified with the pt that the address we have on file is correct. Pt verbalized understanding of results. Pt had no questions at this time but was encouraged to call back if questions arise.

## 2018-02-27 NOTE — Progress Notes (Signed)
BH MD/PA/NP OP Progress Note  02/27/2018 4:22 PM Lauren [name]  MRN:  [id number]  Chief Complaint: I am not doing better.  I am tired and I have no energy.  HPI: Corrie Dandy came earlier than her scheduled appointment.  She is taking Pristiq and Jordan but she does not feel medicine working.  She has a chronic symptoms of fatigue, tired lack of energy and no motivation.  She had tried multiple medication in the past but her chronic symptoms does not go away.  Recently she had a sleep study and she is waiting for the results.  Was told not to take Xanax for sleep study and she is feeling her anxiety is getting worse.  She is sleeping too much.  Sometimes she has difficulty attention and pain concentration.  She is actively looking for a new job.  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  She gets easily overwhelmed.  Though she denies any suicidal thoughts or homicidal thought but reports lack of energy and anxious.  She denies any feeling of hopelessness or worthlessness.  Her energy level is low.  She has no tremors, shakes or any EPS.  She believes [name] help better than Latuda.  She like to go back on [name].  We have discontinued [name] because she was feeling tired but switching [name] does not help her chronic symptoms.    Visit Diagnosis:    ICD-10-CM   1. Major depressive disorder, recurrent episode, moderate (HCC) F33.1 ARIPiprazole ([name]) 10 MG tablet    Past Psychiatric History: Reviewed Again antidepressant from primary care physician since 2002.  She had tried Effexor and Prozac with good response by her primary care physician.  She also took Depakote, Zoloft, Celexa, Cymbalta, Wellbutrin and Brintellix.  Patient took Lamictal with good response but she developed rash.  She also tried [name] which worked very well but then she is complaining of feeling tired and it was switched to Jordan.  Patient denies any history of  suicidal attempt or any inpatient psychiatric treatment.  Past Medical History:  Past Medical History:  Diagnosis Date  . Anemia   . Anxiety   . Constipation   . Depression   . Fibromyalgia   . GERD (gastroesophageal reflux disease)   . Hemorrhoids   . HSV-1 infection   . Hypertension   . Hypothyroidism   . Other abnormal glucose   . Preeclampsia     Past Surgical History:  Procedure Laterality Date  . CHOLECYSTECTOMY  2006  . TONSILLECTOMY    . WISDOM TOOTH EXTRACTION      Family Psychiatric History: Reviewed  Family History:  Family History  Problem Relation Age of Onset  . Hypothyroidism Mother   . Cervical cancer Mother   . Bipolar disorder Mother   . Diabetes Mother   . Colon polyps Mother   . Bipolar disorder Brother   . Kidney disease Brother   . Alcohol abuse Brother   . Hypothyroidism Daughter   . Heart disease Maternal Grandmother   . Diabetes Maternal Grandmother   . Cancer Maternal Grandmother        SKIN  . Hyperlipidemia Maternal Grandmother   . Hypertension Maternal Grandmother   . Hypothyroidism Maternal Grandmother   . Lung cancer Maternal Grandfather   . Heart disease Maternal Aunt   . Diabetes Maternal Aunt   . Diabetes Cousin   . Colon cancer Neg Hx  Social History:  Social History   Socioeconomic History  . Marital status: Married    Spouse name: Not on file  . Number of children: 2  . Years of education: Not on file  . Highest education level: Not on file  Occupational History  . Occupation: Magazine features editorTEACHER    Employer: [employer]  Social Needs  . Financial resource strain: Not on file  . Food insecurity:    Worry: Not on file    Inability: Not on file  . Transportation needs:    Medical: Not on file    Non-medical: Not on file  Tobacco Use  . Smoking status: Never Smoker  . Smokeless tobacco: Never Used  Substance and Sexual Activity  . Alcohol use: No    Alcohol/week: 0.0 oz    Comment: occ  . Drug use: No   . Sexual activity: Yes    Partners: Male    Birth control/protection: None    Comment: husband has had a vasectomy  Lifestyle  . Physical activity:    Days per week: Not on file    Minutes per session: Not on file  . Stress: Not on file  Relationships  . Social connections:    Talks on phone: Not on file    Gets together: Not on file    Attends religious service: Not on file    Active member of club or organization: Not on file    Attends meetings of clubs or organizations: Not on file    Relationship status: Not on file  Other Topics Concern  . Not on file  Social History Narrative   4 caffeine drinks daily     Allergies:  Allergies  Allergen Reactions  . Lamictal [Lamotrigine] Rash  . Doxycycline Other (See Comments)    GI UPSET  . Effexor [Venlafaxine] Other (See Comments)    DYSPHORIA  . Prednisone Other (See Comments)    FLUSHED  . Savella [Milnacipran Hcl] Other (See Comments)    NO RELIEF    Metabolic Disorder Labs: Lab Results  Component Value Date   HGBA1C 5.5 03/08/2017   MPG 111 03/08/2017   MPG 111 06/16/2016   No results found for: PROLACTIN Lab Results  Component Value Date   CHOL 170 03/08/2017   TRIG 169 (H) 03/08/2017   HDL 44 (L) 03/08/2017   CHOLHDL 3.9 03/08/2017   VLDL 34 (H) 03/08/2017   LDLCALC 92 03/08/2017   LDLCALC 94 06/16/2016   Lab Results  Component Value Date   TSH 0.47 12/21/2017   TSH 0.93 03/08/2017    Therapeutic Level Labs: No results found for: LITHIUM No results found for: VALPROATE No components found for:  CBMZ  Current Medications: Current Outpatient Medications  Medication Sig Dispense Refill  . ALPRAZolam (XANAX) 0.5 MG tablet Take by mouth.    . Cholecalciferol (VITAMIN D) 2000 UNITS CAPS Take 8,000 Units by mouth daily.     Marland Kitchen. desvenlafaxine (PRISTIQ) 100 MG 24 hr tablet Take 1 tablet (100 mg total) by mouth daily. 30 tablet 2  . Ferrous Sulfate (IRON) 325 (65 Fe) MG TABS Take by mouth 2 (two) times  daily.    Marland Kitchen. ibuprofen (ADVIL,MOTRIN) 200 MG tablet Take 200 mg by mouth every 6 (six) hours as needed for pain. Takes 4 tablets (800 mg) every 4-6 hrs prn    . lurasidone (LATUDA) 40 MG TABS tablet Take 1 tablet (40 mg total) by mouth daily with breakfast. 30 tablet 2  . metroNIDAZOLE (  METROGEL VAGINAL) 0.75 % vaginal gel Place 1 Applicatorful vaginally at bedtime as needed. no alcohol or sex while using 70 g 0  . pravastatin (PRAVACHOL) 40 MG tablet TAKE 1 TABLET BY MOUTH DAILY 90 tablet 1  . SYNTHROID 150 MCG tablet TAKE 1 TABLET BY MOUTH EVERY DAY BEFORE BREAKFAST 90 tablet 1  . vitamin E (VITAMIN E) 400 UNIT capsule Take 800 Units by mouth daily.     No current facility-administered medications for this visit.      Musculoskeletal: Strength & Muscle Tone: within normal limits Gait & Station: normal Patient leans: N/A  Psychiatric Specialty Exam: ROS  Blood pressure (!) 129/92, pulse 90, height 5\' 1"  (1.549 m), weight 195 lb 3.2 oz (88.5 kg), SpO2 98 %.There is no height or weight on file to calculate BMI.  General Appearance: Casual  Eye Contact:  Fair  Speech:  Slow  Volume:  Decreased  Mood:  Dysphoric and Tired  Affect:  Congruent  Thought Process:  Goal Directed  Orientation:  Full (Time, Place, and Person)  Thought Content: Rumination   Suicidal Thoughts:  No  Homicidal Thoughts:  No  Memory:  Immediate;   Good Recent;   Good Remote;   Good  Judgement:  Good  Insight:  Good  Psychomotor Activity:  Decreased  Concentration:  Concentration: Fair and Attention Span: Fair  Recall:  Good  Fund of Knowledge: Good  Language: Good  Akathisia:  No  Handed:  Right  AIMS (if indicated): not done  Assets:  Communication Skills Desire for Improvement Housing Resilience Social Support Talents/Skills Transportation  ADL's:  Intact  Cognition: WNL  Sleep:  Too much   Screenings: PHQ2-9     Office Visit from 12/28/2017 in [facility]  PHQ-2 Total Score  3   PHQ-9 Total Score  13       Assessment and Plan: Major depressive disorder, recurrent.  Generalized anxiety disorder.  I review her previous notes.  Patient has chronic complaint of feeling tired and lack of energy.  We have tried many medication because she had concern that medicine causing fatigue.  I do believe that chronic fatigue is a separate issue.  She did well on [name].  I do believe she should go back to [name] 10 mg because it was working.  I believe her chronic fatigue is due to underlying sleep disorder as she may have narcolepsy.  We will wait for sleep study results.  I would also do genetic testing.  If patient diagnosed with narcolepsy then stimulant may help her chronic fatigue.  I will see her again in 4 weeks.  Continue Pristiq 100 mg daily and I will discontinue Latuda.  She is also not taking Xanax because of the sleep study but she will resume soon once we have results available.     Cleotis Nipper, MD 02/27/2018, 4:22 PM

## 2018-03-06 NOTE — Progress Notes (Signed)
Complete Physical  Assessment and Plan:   Routine general medical examination at a health care facility  Essential hypertension - continue medications, DASH diet, exercise and monitor at home. Call if greater than 130/80.  - Urinalysis, Routine w reflex microscopic (not at [facility]) - Microalbumin / creatinine urine ratio - EKG 12-Lead - TSH   Hypothyroidism, unspecified hypothyroidism type -TSH -cont levothyroxine  Prediabetes Discussed general issues about diabetes pathophysiology and management., Educational material distributed., Suggested low cholesterol diet., Encouraged aerobic exercise., Discussed foot care., Reminded to get yearly retinal exam. - Hemoglobin A1c - Insulin, random   Hyperlipidemia LDL goal <100 -continue medications, check lipids, decrease fatty foods, increase activity.  - Lipid panel  Depression -cont therapy -cont to see Dr. Lolly Mustache -cont 0.5 mg xanax prn ? FM- given low dose amtriptyline 10mg  TID - suggest massage/PT  Medication management - CBC with Differential/Platelet - BASIC METABOLIC PANEL WITH GFR - Hepatic function panel - Magnesium  Vitamin D deficiency -cont supplement - VITAMIN D 25 Hydroxy (Vit-D Deficiency, Fractures)  Class 2 severe obesity due to excess calories with serious comorbidity and body mass index (BMI) of 38.0 to 38.9 in adult [facility])  long discussion about weight loss, diet, and exercise  Headache  Getting CPAP Discussed TMJ  Vitamin D deficiency -     VITAMIN D 25 Hydroxy (Vit-D Deficiency, Fractures)   Discussed med's effects and SE's. Screening labs and tests as requested with regular follow-up as recommended.  HPI  37 y.o. female  presents for a complete physical.  Her blood pressure has been controlled at home, today their BP is BP: 124/80.  She does workout. She denies chest pain, shortness of breath, dizziness.   She follows with Dr. Lolly Mustache for depression.  She was sent to Dr. Vickey Huger for excessive  day time somnolence and suppose to start on CPAP for OSA, has called but not been able to get a hold of the company yet, will try again after the holiday.   She states she has a lot of stress, trying to find a new job due to stress, for last week, she has had head, pain in arms and legs. She also complains of excessive gagging x 1 month, will be with brushing teeth, in shower, not associated with food. Denies sinus drainage, has GERD.   She is on cholesterol medication and denies myalgias. Her cholesterol is at goal. The cholesterol last visit was:  Lab Results  Component Value Date   CHOL 170 03/08/2017   HDL 44 (L) 03/08/2017   LDLCALC 92 03/08/2017   LDLDIRECT 114.9 04/30/2012   TRIG 169 (H) 03/08/2017   CHOLHDL 3.9 03/08/2017  . She has been working on diet and exercise for prediabetes, she is not on bASA, she is not on ACE/ARB and denies foot ulcerations, hyperglycemia, hypoglycemia , increased appetite, nausea, paresthesia of the feet, polydipsia, polyuria, visual disturbances, vomiting and weight loss. Last A1C in the office was:  Lab Results  Component Value Date   HGBA1C 5.5 03/08/2017    Patient is on Vitamin D supplement.   Lab Results  Component Value Date   VD25OH 53 12/21/2017     She is on thyroid medication. Her medication was not changed last visit.   Lab Results  Component Value Date   TSH 0.47 12/21/2017  .  BMI is Body mass index is 37.37 kg/m., she is working on diet and exercise. She has had some weight gain with abiligy, on pristiq and still following with  Dr. Rocky CraftsArfeen Wt Readings from Last 3 Encounters:  03/08/18 197 lb 12.8 oz (89.7 kg)  01/16/18 197 lb (89.4 kg)  12/28/17 198 lb 12.8 oz (90.2 kg)    Current Medications:  Current Outpatient Medications on File Prior to Visit  Medication Sig  . ALPRAZolam (XANAX) 0.5 MG tablet Take by mouth.  . ARIPiprazole ([name]) 10 MG tablet Take 1/2 tab daily for 1 week and thanfull tab daily  . Cholecalciferol  (VITAMIN D) 2000 UNITS CAPS Take 8,000 Units by mouth daily.   Marland Kitchen. desvenlafaxine (PRISTIQ) 100 MG 24 hr tablet Take 1 tablet (100 mg total) by mouth daily.  . Ferrous Sulfate (IRON) 325 (65 Fe) MG TABS Take by mouth 2 (two) times daily.  Marland Kitchen. ibuprofen (ADVIL,MOTRIN) 200 MG tablet Take 200 mg by mouth every 6 (six) hours as needed for pain. Takes 4 tablets (800 mg) every 4-6 hrs prn  . metroNIDAZOLE (METROGEL VAGINAL) 0.75 % vaginal gel Place 1 Applicatorful vaginally at bedtime as needed. no alcohol or sex while using  . pravastatin (PRAVACHOL) 40 MG tablet TAKE 1 TABLET BY MOUTH DAILY  . SYNTHROID 150 MCG tablet TAKE 1 TABLET BY MOUTH EVERY DAY BEFORE BREAKFAST  . vitamin E (VITAMIN E) 400 UNIT capsule Take 800 Units by mouth daily.   No current facility-administered medications on file prior to visit.     Health Maintenance:   Immunization History  Administered Date(s) Administered  . Influenza Split 09/09/2015  . Influenza,inj,quad, With Preservative 09/19/2016  . Influenza-Unspecified 08/05/2013  . Td 11/21/1997  . Tdap 01/20/2011   Tetanus: 2012 Flu vaccine:2018 Pap: 2019 neg HPV Dr. Daphine DeutscherMartin every 3 years Colonoscopy: 2019 Dr. Caryl NeverJue Last Dental Exam:  Twice yearly Last Eye Exam:  Dr. Lorin PicketScott, 2018  Patient Care Team: Lucky CowboyMcKeown, William, MD as PCP - General (Internal Medicine) Fredrich BirksScott, Jon, OD as Referring Physician (Optometry) Sherian ReinBovard-Stuckert, Jody, MD as Consulting Physician (Obstetrics and Gynecology) Dairl PonderWeingold, Matthew, MD as Consulting Physician (Orthopedic Surgery) Elvina SidleBalakrishnan, Nikhil, MD (Neurology) Venancio PoissonLomax, Laura, MD as Consulting Physician (Dermatology) Pollyann Savoyeveshwar, Shaili, MD as Consulting Physician (Rheumatology) Lolly MustacheArfeen, [name], MD as Consulting Physician (Psychiatry)   Medical History:  Past Medical History:  Diagnosis Date  . Anemia   . Anxiety   . Constipation   . Depression   . Fibromyalgia   . GERD (gastroesophageal reflux disease)   . Hemorrhoids   . HSV-1  infection   . Hypertension   . Hypothyroidism   . Other abnormal glucose   . Preeclampsia    Allergies Allergies  Allergen Reactions  . Lamictal [Lamotrigine] Rash  . Doxycycline Other (See Comments)    GI UPSET  . Effexor [Venlafaxine] Other (See Comments)    DYSPHORIA  . Prednisone Other (See Comments)    FLUSHED  . Savella [Milnacipran Hcl] Other (See Comments)    NO RELIEF    SURGICAL HISTORY She  has a past surgical history that includes Cholecystectomy (2006); Tonsillectomy; and Wisdom tooth extraction. FAMILY HISTORY Her family history includes Alcohol abuse in her brother; Bipolar disorder in her brother and mother; Cancer in her maternal grandmother; Cervical cancer in her mother; Colon polyps in her mother; Diabetes in her cousin, maternal aunt, maternal grandmother, and mother; Heart disease in her maternal aunt and maternal grandmother; Hyperlipidemia in her maternal grandmother; Hypertension in her maternal grandmother; Hypothyroidism in her daughter, maternal grandmother, and mother; Kidney disease in her brother; Lung cancer in her maternal grandfather. SOCIAL HISTORY She  reports that she has never smoked. She  has never used smokeless tobacco. She reports that she does not drink alcohol or use drugs.  Review of Systems: Review of Systems  Constitutional: Negative for chills, fever and malaise/fatigue.  Respiratory: Negative for cough, shortness of breath and wheezing.   Cardiovascular: Negative for chest pain, palpitations and leg swelling.  Gastrointestinal: Negative for abdominal pain, blood in stool, constipation, diarrhea, heartburn and melena.  Genitourinary: Negative.   Skin: Negative.   Neurological: Positive for dizziness and headaches. Negative for tingling, tremors, sensory change, speech change, focal weakness, seizures and loss of consciousness.  Psychiatric/Behavioral: Positive for depression. The patient is nervous/anxious. The patient does not have  insomnia.     Physical Exam: Estimated body mass index is 37.37 kg/m as calculated from the following:   Height as of this encounter: 5\' 1"  (1.549 m).   Weight as of this encounter: 197 lb 12.8 oz (89.7 kg). BP 124/80   Pulse 98   Temp 97.7 F (36.5 C)   Resp 14   Ht 5\' 1"  (1.549 m)   Wt 197 lb 12.8 oz (89.7 kg)   LMP 02/22/2018   SpO2 98%   BMI 37.37 kg/m   General Appearance: Well nourished well developed, in no apparent distress.  Eyes: PERRLA, EOMs, conjunctiva no swelling or erythema ENT/Mouth: Ear canals normal without obstruction, swelling, erythema, or discharge.  TMs normal bilaterally with no erythema, bulging, retraction, or loss of landmark.  Oropharynx moist and clear with no exudate, erythema, or swelling. Neck: Supple, thyroid normal. No bruits.  No cervical adenopathy Respiratory: Respiratory effort normal, Breath sounds clear A&P without wheeze, rhonchi, rales.   Cardio: RRR without murmurs, rubs or gallops. Brisk peripheral pulses without edema.  Chest: symmetric, with normal excursions Abdomen: Soft, nontender, no guarding, rebound, hernias, masses, or organomegaly.  Lymphatics: Non tender without lymphadenopathy.   Musculoskeletal: Full ROM all peripheral extremities,5/5 strength, and normal gait.  Skin: Warm, dry without rashes, lesions, ecchymosis. Neuro: Awake and oriented X 3, Cranial nerves intact, reflexes equal bilaterally. Normal muscle tone, no cerebellar symptoms. Sensation intact.  Psych:  Flat and depressed affect, Insight and Judgment appropriate.   EKG: WNL no changes.  Over 40 minutes of exam, counseling, chart review and critical decision making was performed  Quentin Mulling 3:25 PM [facility]

## 2018-03-08 ENCOUNTER — Ambulatory Visit (INDEPENDENT_AMBULATORY_CARE_PROVIDER_SITE_OTHER): Payer: BC Managed Care – PPO | Admitting: Physician Assistant

## 2018-03-08 ENCOUNTER — Encounter: Payer: Self-pay | Admitting: Physician Assistant

## 2018-03-08 VITALS — BP 124/80 | HR 98 | Temp 97.7°F | Resp 14 | Ht 61.0 in | Wt 197.8 lb

## 2018-03-08 DIAGNOSIS — Z131 Encounter for screening for diabetes mellitus: Secondary | ICD-10-CM | POA: Diagnosis not present

## 2018-03-08 DIAGNOSIS — G8220 Paraplegia, unspecified: Secondary | ICD-10-CM

## 2018-03-08 DIAGNOSIS — Z136 Encounter for screening for cardiovascular disorders: Secondary | ICD-10-CM

## 2018-03-08 DIAGNOSIS — E785 Hyperlipidemia, unspecified: Secondary | ICD-10-CM

## 2018-03-08 DIAGNOSIS — Z1329 Encounter for screening for other suspected endocrine disorder: Secondary | ICD-10-CM | POA: Diagnosis not present

## 2018-03-08 DIAGNOSIS — K760 Fatty (change of) liver, not elsewhere classified: Secondary | ICD-10-CM

## 2018-03-08 DIAGNOSIS — I10 Essential (primary) hypertension: Secondary | ICD-10-CM

## 2018-03-08 DIAGNOSIS — F3341 Major depressive disorder, recurrent, in partial remission: Secondary | ICD-10-CM

## 2018-03-08 DIAGNOSIS — Z0000 Encounter for general adult medical examination without abnormal findings: Secondary | ICD-10-CM

## 2018-03-08 DIAGNOSIS — Z1389 Encounter for screening for other disorder: Secondary | ICD-10-CM

## 2018-03-08 DIAGNOSIS — Z79899 Other long term (current) drug therapy: Secondary | ICD-10-CM | POA: Diagnosis not present

## 2018-03-08 DIAGNOSIS — Z13220 Encounter for screening for lipoid disorders: Secondary | ICD-10-CM

## 2018-03-08 DIAGNOSIS — R160 Hepatomegaly, not elsewhere classified: Secondary | ICD-10-CM

## 2018-03-08 DIAGNOSIS — E039 Hypothyroidism, unspecified: Secondary | ICD-10-CM

## 2018-03-08 MED ORDER — AMITRIPTYLINE HCL 10 MG PO TABS: ORAL_TABLET | ORAL | 0 refills | Status: DC

## 2018-03-08 NOTE — Patient Instructions (Addendum)
Add zantac 150mg  twice a day for 2 weeks, then do once a day for 2 weeks, can then do as needed.   Can take amitriptyline up to 3 x a day, start 1 pill at night to see how you do, take AS needed for nerve pain Message if you feel that you need more or if this is not helping  Head to feet massage on [place]   Myofascial Pain Syndrome and Fibromyalgia Myofascial pain syndrome and fibromyalgia are both pain disorders. This pain may be felt mainly in your muscles.  Myofascial pain syndrome: ? Always has trigger points or tender points in the muscle that will cause pain when pressed. The pain may come and go. ? Usually affects your neck, upper back, and shoulder areas. The pain often radiates into your arms and hands.  Fibromyalgia: ? Has muscle pains and tenderness that come and go. ? Is often associated with fatigue and sleep disturbances. ? Has trigger points. ? Tends to be long-lasting (chronic), but is not life-threatening.  Fibromyalgia and myofascial pain are not the same. However, they often occur together. If you have both conditions, each can make the other worse. Both are common and can cause enough pain and fatigue to make day-to-day activities difficult. What are the causes? The exact causes of fibromyalgia and myofascial pain are not known. People with certain gene types may be more likely to develop fibromyalgia. Some factors can be triggers for both conditions, such as:  Spine disorders.  Arthritis.  Severe injury (trauma) and other physical stressors.  Being under a lot of stress.  A medical illness.  What are the signs or symptoms? Fibromyalgia The main symptom of fibromyalgia is widespread pain and tenderness in your muscles. This can vary over time. Pain is sometimes described as stabbing, shooting, or burning. You may have tingling or numbness, too. You may also have sleep problems and fatigue. You may wake up feeling tired and groggy (fibro fog). Other  symptoms may include:  Bowel and bladder problems.  Headaches.  Visual problems.  Problems with odors and noises.  Depression or mood changes.  Painful menstrual periods (dysmenorrhea).  Dry skin or eyes.  Myofascial pain syndrome Symptoms of myofascial pain syndrome include:  Tight, ropy bands of muscle.  Uncomfortable sensations in muscular areas, such as: ? Aching. ? Cramping. ? Burning. ? Numbness. ? Tingling. ? Muscle weakness.  Trouble moving certain muscles freely (range of motion).  How is this diagnosed? There are no specific tests to diagnose fibromyalgia or myofascial pain syndrome. Both can be hard to diagnose because their symptoms are common in many other conditions. Your health care provider may suspect one or both of these conditions based on your symptoms and medical history. Your health care provider will also do a physical exam. The key to diagnosing fibromyalgia is having pain, fatigue, and other symptoms for more than three months that cannot be explained by another condition. The key to diagnosing myofascial pain syndrome is finding trigger points in muscles that are tender and cause pain elsewhere in your body (referred pain). How is this treated? Treating fibromyalgia and myofascial pain often requires a team of health care providers. This usually starts with your primary provider and a physical therapist. You may also find it helpful to work with alternative health care providers, such as massage therapists or acupuncturists. Treatment for fibromyalgia may include medicines. This may include nonsteroidal anti-inflammatory drugs (NSAIDs), along with other medicines. Treatment for myofascial pain may also  include:  NSAIDs.  Cooling and stretching of muscles.  Trigger point injections.  Sound wave (ultrasound) treatments to stimulate muscles.  Follow these instructions at home:  Take medicines only as directed by your health care  provider.  Exercise as directed by your health care provider or physical therapist.  Try to avoid stressful situations.  Practice relaxation techniques to control your stress. You may want to try: ? Biofeedback. ? Visual imagery. ? Hypnosis. ? Muscle relaxation. ? Yoga. ? Meditation.  Talk to your health care provider about alternative treatments, such as acupuncture or massage treatment.  Maintain a healthy lifestyle. This includes eating a healthy diet and getting enough sleep.  Consider joining a support group.  Do not do activities that stress or strain your muscles. That includes repetitive motions and heavy lifting. Where to find more information:  National Fibromyalgia Association: www.fmaware.org  Arthritis Foundation: www.arthritis.org  American Chronic Pain Association: GumSearch.nl Contact a health care provider if:  You have new symptoms.  Your symptoms get worse.  You have side effects from your medicines.  You have trouble sleeping.  Your condition is causing depression or anxiety. This information is not intended to replace advice given to you by your health care provider. Make sure you discuss any questions you have with your health care provider. Document Released: 11/07/2005 Document Revised: 04/14/2016 Document Reviewed: 08/13/2014 Elsevier Interactive Patient Education  [employer].

## 2018-03-09 LAB — CBC WITH DIFFERENTIAL/PLATELET
BASOS PCT: 0.3 %
Basophils Absolute: 27 cells/uL (ref 0–200)
EOS PCT: 0.7 %
Eosinophils Absolute: 62 cells/uL (ref 15–500)
HCT: 39.2 % (ref 35.0–45.0)
Hemoglobin: 13.3 g/dL (ref 11.7–15.5)
Lymphs Abs: 2830 cells/uL (ref 850–3900)
MCH: 28.8 pg (ref 27.0–33.0)
MCHC: 33.9 g/dL (ref 32.0–36.0)
MCV: 84.8 fL (ref 80.0–100.0)
MONOS PCT: 6.6 %
MPV: 11.7 fL (ref 7.5–12.5)
NEUTROS PCT: 60.6 %
Neutro Abs: 5393 cells/uL (ref 1500–7800)
Platelets: 213 10*3/uL (ref 140–400)
RBC: 4.62 10*6/uL (ref 3.80–5.10)
RDW: 12.1 % (ref 11.0–15.0)
Total Lymphocyte: 31.8 %
WBC mixed population: 587 cells/uL (ref 200–950)
WBC: 8.9 10*3/uL (ref 3.8–10.8)

## 2018-03-09 LAB — COMPLETE METABOLIC PANEL WITH GFR
AG RATIO: 1.7 (calc) (ref 1.0–2.5)
ALT: 22 U/L (ref 6–29)
AST: 19 U/L (ref 10–30)
Albumin: 4.6 g/dL (ref 3.6–5.1)
Alkaline phosphatase (APISO): 67 U/L (ref 33–115)
BILIRUBIN TOTAL: 0.3 mg/dL (ref 0.2–1.2)
BUN: 14 mg/dL (ref 7–25)
CALCIUM: 9.6 mg/dL (ref 8.6–10.2)
CHLORIDE: 102 mmol/L (ref 98–110)
CO2: 29 mmol/L (ref 20–32)
Creat: 0.94 mg/dL (ref 0.50–1.10)
GFR, Est African American: 89 mL/min/{1.73_m2} (ref >=60)
GFR, Est Non African American: 76 mL/min/{1.73_m2} (ref >=60)
GLUCOSE: 102 mg/dL — AB (ref 65–99)
Globulin: 2.7 g/dL (calc) (ref 1.9–3.7)
POTASSIUM: 3.9 mmol/L (ref 3.5–5.3)
Sodium: 140 mmol/L (ref 135–146)
Total Protein: 7.3 g/dL (ref 6.1–8.1)

## 2018-03-09 LAB — URINALYSIS, ROUTINE W REFLEX MICROSCOPIC
Bilirubin Urine: NEGATIVE
Glucose, UA: NEGATIVE
Hgb urine dipstick: NEGATIVE
LEUKOCYTES UA: NEGATIVE
NITRITE: NEGATIVE
PROTEIN: NEGATIVE
Specific Gravity, Urine: 1.029 (ref 1.001–1.03)
pH: 7 (ref 5.0–8.0)

## 2018-03-09 LAB — MICROALBUMIN / CREATININE URINE RATIO
CREATININE, URINE: 179 mg/dL (ref 20–275)
MICROALB/CREAT RATIO: 7 ug/mg{creat} (ref <30)
Microalb, Ur: 1.2 mg/dL

## 2018-03-09 LAB — LIPID PANEL
Cholesterol: 193 mg/dL (ref <200)
HDL: 44 mg/dL — AB (ref >50)
LDL Cholesterol (Calc): 101 mg/dL (calc) — ABNORMAL HIGH
Non-HDL Cholesterol (Calc): 149 mg/dL (calc) — ABNORMAL HIGH (ref <130)
TRIGLYCERIDES: 357 mg/dL — AB (ref <150)
Total CHOL/HDL Ratio: 4.4 (calc) (ref <5.0)

## 2018-03-09 LAB — TSH: TSH: 0.12 mIU/L — ABNORMAL LOW

## 2018-03-09 LAB — HEMOGLOBIN A1C
Hgb A1c MFr Bld: 5.9 % of total Hgb — ABNORMAL HIGH (ref <5.7)
Mean Plasma Glucose: 123 (calc)
eAG (mmol/L): 6.8 (calc)

## 2018-03-09 LAB — MAGNESIUM: MAGNESIUM: 2.1 mg/dL (ref 1.5–2.5)

## 2018-03-21 ENCOUNTER — Ambulatory Visit: Payer: BC Managed Care – PPO | Admitting: Adult Health

## 2018-03-21 ENCOUNTER — Other Ambulatory Visit (HOSPITAL_COMMUNITY): Payer: Self-pay | Admitting: Psychiatry

## 2018-03-21 ENCOUNTER — Ambulatory Visit: Payer: Self-pay | Admitting: Adult Health

## 2018-03-21 ENCOUNTER — Encounter: Payer: Self-pay | Admitting: Adult Health

## 2018-03-21 VITALS — BP 110/80 | HR 79 | Temp 97.4°F | Ht 61.0 in | Wt 195.0 lb

## 2018-03-21 DIAGNOSIS — F331 Major depressive disorder, recurrent, moderate: Secondary | ICD-10-CM

## 2018-03-21 DIAGNOSIS — J029 Acute pharyngitis, unspecified: Secondary | ICD-10-CM | POA: Diagnosis not present

## 2018-03-21 MED ORDER — DEXAMETHASONE 0.5 MG PO TABS: ORAL_TABLET | ORAL | 0 refills | Status: DC

## 2018-03-21 MED ORDER — PROMETHAZINE-DM 6.25-15 MG/5ML PO SYRP: 5.0000 mL | ORAL_SOLUTION | Freq: Four times a day (QID) | ORAL | 1 refills | Status: DC | PRN

## 2018-03-21 MED ORDER — AZITHROMYCIN 250 MG PO TABS: ORAL_TABLET | ORAL | 1 refills | Status: AC

## 2018-03-21 NOTE — Patient Instructions (Signed)

## 2018-03-21 NOTE — Progress Notes (Signed)
Assessment and Plan:  Acute pharyngitis, unspecified etiology Continue with tylenol for pain and fever Suggested symptomatic OTC remedies. Nasal saline spray for congestion. Nasal steroids, allergy pill, oral steroids Follow up as needed. Go to ER for sudden or severe symptoms.  -     azithromycin (ZITHROMAX) 250 MG tablet; Take 2 tablets (500 mg) on  Day 1,  followed by 1 tablet (250 mg) once daily on Days 2 through 5. -     promethazine-dextromethorphan (PROMETHAZINE-DM) 6.25-15 MG/5ML syrup; Take 5 mLs by mouth 4 (four) times daily as needed for cough. -     dexamethasone (DECADRON) 0.5 MG tablet; Take 1 tablet PO BID for 5 days and then 1 tablet PO QDaily for 5 days.  Further disposition pending results of labs. Discussed med's effects and SE's.   Over 15 minutes of exam, counseling, chart review, and critical decision making was performed.   Future Appointments  Date Time Provider Department Center  04/12/2018  4:00 PM Arfeen, Phillips Grout, MD [facility] None  05/23/2018 11:15 AM Nilda Riggs, NP [facility] None  09/13/2018  4:30 PM Quentin Mulling, PA-C [facility] None  03/12/2019  3:00 PM Quentin Mulling, PA-C [facility] None    ------------------------------------------------------------------------------------------------------------------   HPI BP 110/80   Pulse 79   Temp (!) 97.4 F (36.3 C)   Ht  (1.549 m)   Wt 195 lb (88.5 kg)   LMP 02/22/2018   SpO2 98%   BMI 36.84 kg/m   37 y.o.female presents for 4 days of sore throat, mild headache, was seen at urgent care on day 2, was given amoxicillin and flonase, she presents today reporting symptoms are getting worse, had fever of 101-2 every evening, having nasal congestion, pain with swallowing, non-productive cough and mild nausea, diarrhea and mild body aches. She has been taking tylenol, which is the only thing that is helping her symptoms. Denies blurry vision, dyspnea, chest pain, palpitations, rash, emesis.   Past  Medical History:  Diagnosis Date  . Anemia   . Anxiety   . Constipation   . Depression   . Fibromyalgia   . GERD (gastroesophageal reflux disease)   . Hemorrhoids   . HSV-1 infection   . Hypertension   . Hypothyroidism   . Other abnormal glucose   . Preeclampsia      Allergies  Allergen Reactions  . Lamictal [Lamotrigine] Rash  . Doxycycline Other (See Comments)    GI UPSET  . Effexor [Venlafaxine] Other (See Comments)    DYSPHORIA  . Prednisone Other (See Comments)    FLUSHED  . Savella [Milnacipran Hcl] Other (See Comments)    NO RELIEF    Current Outpatient Medications on File Prior to Visit  Medication Sig  . amoxicillin (AMOXIL) 875 MG tablet Take 875 mg by mouth 2 (two) times daily.  . ARIPiprazole ([name]) 10 MG tablet Take 1/2 tab daily for 1 week and thanfull tab daily (Patient taking differently: Take one tablet by mouth daily)  . Cholecalciferol (VITAMIN D) 2000 UNITS CAPS Take 8,000 Units by mouth daily.   Marland Kitchen desvenlafaxine (PRISTIQ) 100 MG 24 hr tablet Take 1 tablet (100 mg total) by mouth daily.  . Ferrous Sulfate (IRON) 325 (65 Fe) MG TABS Take by mouth 2 (two) times daily.  Marland Kitchen ibuprofen (ADVIL,MOTRIN) 200 MG tablet Take 200 mg by mouth every 6 (six) hours as needed for pain. Takes 4 tablets (800 mg) every 4-6 hrs prn  . metroNIDAZOLE (METROGEL VAGINAL) 0.75 % vaginal gel Place  1 Applicatorful vaginally at bedtime as needed. no alcohol or sex while using  . pravastatin (PRAVACHOL) 40 MG tablet TAKE 1 TABLET BY MOUTH DAILY  . SYNTHROID 150 MCG tablet TAKE 1 TABLET BY MOUTH EVERY DAY BEFORE BREAKFAST  . vitamin E (VITAMIN E) 400 UNIT capsule Take 800 Units by mouth daily.  Marland Kitchen ALPRAZolam (XANAX) 0.5 MG tablet Take by mouth.  Marland Kitchen amitriptyline (ELAVIL) 10 MG tablet 1 pill up to 3 x a day for nerve pain (Patient not taking: Reported on 03/21/2018)   No current facility-administered medications on file prior to visit.     ROS: Review of Systems  Constitutional:  Positive for fever and malaise/fatigue. Negative for chills and diaphoresis.  HENT: Positive for congestion and sore throat. Negative for ear discharge, ear pain, hearing loss, sinus pain and tinnitus.   Eyes: Negative for blurred vision, double vision, photophobia, pain, discharge and redness.  Respiratory: Positive for cough. Negative for hemoptysis, sputum production, shortness of breath, wheezing and stridor.   Cardiovascular: Negative for chest pain, palpitations and orthopnea.  Gastrointestinal: Negative for abdominal pain, diarrhea, nausea and vomiting.  Genitourinary: Negative.   Musculoskeletal: Positive for myalgias. Negative for joint pain.  Skin: Negative for rash.  Neurological: Positive for headaches. Negative for dizziness, sensory change and weakness.  Endo/Heme/Allergies: Negative for environmental allergies.  Psychiatric/Behavioral: Negative.   All other systems reviewed and are negative.     Physical Exam:  BP 110/80   Pulse 79   Temp (!) 97.4 F (36.3 C)   Ht  (1.549 m)   Wt 195 lb (88.5 kg)   LMP 02/22/2018   SpO2 98%   BMI 36.84 kg/m   General Appearance: Well nourished, appears to be feeling unwell but in no acute distress. Eyes: PERRLA, EOMs, conjunctiva no swelling or erythema Sinuses: No Frontal/maxillary tenderness ENT/Mouth: Ext aud canals clear, TMs without erythema, bulging. Pharynx erythematous, without edema or exudate on post pharynx.  Tonsils not visible. Hearing normal.  Neck: Supple.  Respiratory: Respiratory effort normal, BS equal bilaterally without rales, rhonchi, wheezing or stridor.  Cardio: RRR with no MRGs. Brisk peripheral pulses without edema.  Abdomen: Soft, + BS.  Non tender, no guarding, rebound, hernias, masses. Lymphatics: Non tender without lymphadenopathy.  Musculoskeletal: normal gait.  Skin: Warm, dry without rashes, lesions, ecchymosis.  Neuro: Cranial nerves intact. Normal muscle tone, no cerebellar symptoms.   Psych: Awake and oriented X 3, normal affect, Insight and Judgment appropriate.    Dan Maker, NP 11:07 AM [facility]

## 2018-03-28 ENCOUNTER — Other Ambulatory Visit (HOSPITAL_COMMUNITY): Payer: Self-pay | Admitting: Psychiatry

## 2018-03-28 DIAGNOSIS — F331 Major depressive disorder, recurrent, moderate: Secondary | ICD-10-CM

## 2018-03-30 ENCOUNTER — Other Ambulatory Visit: Payer: Self-pay | Admitting: Physician Assistant

## 2018-04-09 ENCOUNTER — Other Ambulatory Visit (HOSPITAL_COMMUNITY): Payer: Self-pay | Admitting: Psychiatry

## 2018-04-09 DIAGNOSIS — F331 Major depressive disorder, recurrent, moderate: Secondary | ICD-10-CM

## 2018-04-12 ENCOUNTER — Ambulatory Visit (HOSPITAL_COMMUNITY): Payer: BC Managed Care – PPO | Admitting: Psychiatry

## 2018-04-12 ENCOUNTER — Encounter

## 2018-04-12 ENCOUNTER — Ambulatory Visit (HOSPITAL_COMMUNITY): Payer: Self-pay | Admitting: Psychiatry

## 2018-04-12 ENCOUNTER — Encounter (HOSPITAL_COMMUNITY): Payer: Self-pay | Admitting: Psychiatry

## 2018-04-12 DIAGNOSIS — Z9989 Dependence on other enabling machines and devices: Secondary | ICD-10-CM

## 2018-04-12 DIAGNOSIS — Z811 Family history of alcohol abuse and dependence: Secondary | ICD-10-CM | POA: Diagnosis not present

## 2018-04-12 DIAGNOSIS — F411 Generalized anxiety disorder: Secondary | ICD-10-CM

## 2018-04-12 DIAGNOSIS — F331 Major depressive disorder, recurrent, moderate: Secondary | ICD-10-CM | POA: Diagnosis not present

## 2018-04-12 DIAGNOSIS — Z818 Family history of other mental and behavioral disorders: Secondary | ICD-10-CM | POA: Diagnosis not present

## 2018-04-12 MED ORDER — DESVENLAFAXINE SUCCINATE ER 100 MG PO TB24: 100.0000 mg | ORAL_TABLET | Freq: Every day | ORAL | 2 refills | Status: DC

## 2018-04-12 MED ORDER — ARIPIPRAZOLE 10 MG PO TABS: ORAL_TABLET | ORAL | 2 refills | Status: DC

## 2018-04-12 NOTE — Progress Notes (Signed)
BH MD/PA/NP OP Progress Note  04/12/2018 4:11 PM Lauren [name]  MRN:  [id number]  Chief Complaint: I am doing better with [name].  I am less depressed but I still tired in the daytime.  HPI: Lauren [name] came for her follow-up appointment.  On her last visit we restarted [name] which helped her in the past.  She stopped taking [name] because she thought it was causing her more tired and decreased energy.  However her energy remains the same.  She is feeling less depressed and less anxious since started [name].  She also using CPAP machine after her sleep studies discovered that she has sleep apnea.  She did not see a huge improvement with the CPAP machine.  We also do genetic testing and medication that appropriate to take were Pristiq and [name].  Recently her daughter started her on amitriptyline for nerve pain but she has been not taking it.  On genetic testing it shows that amitriptyline can cause significant drug drug interaction.  She has not taken Xanax in a while.  She is excited because she is able to find a new job close by.  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  She is hoping the new environment may help her.  She has no tremors, shakes or any EPS.  She denies any feeling of hopelessness or worthlessness.  She denies any paranoia or any hallucination.  She is scheduled to see neurology again in June and I recommended she should discuss narcolepsy with her.  Visit Diagnosis:    ICD-10-CM   1. Major depressive disorder, recurrent episode, moderate (HCC) F33.1 desvenlafaxine (PRISTIQ) 100 MG 24 hr tablet    ARIPiprazole ([name]) 10 MG tablet    Past Psychiatric History: Reviewed. Patient started antidepressant from primary care physician since 2002.  She had tried Effexor and Prozac with good response by her primary care physician.  She also took Depakote, Zoloft, Celexa, Cymbalta, Wellbutrin and Brintellix.  Patient took Lamictal with good response but she  developed rash.  She also tried [name] which worked very well but then she is complaining of feeling tired and it was switched to [place].  Patient denies any history of suicidal attempt or any inpatient psychiatric treatment.   Past Medical History:  Past Medical History:  Diagnosis Date  . Anemia   . Anxiety   . Constipation   . Depression   . Fibromyalgia   . GERD (gastroesophageal reflux disease)   . Hemorrhoids   . HSV-1 infection   . Hypertension   . Hypothyroidism   . Other abnormal glucose   . Preeclampsia     Past Surgical History:  Procedure Laterality Date  . CHOLECYSTECTOMY  2006  . TONSILLECTOMY    . WISDOM TOOTH EXTRACTION      Family Psychiatric History: Reviewed.  Family History:  Family History  Problem Relation Age of Onset  . Hypothyroidism Mother   . Cervical cancer Mother   . Bipolar disorder Mother   . Diabetes Mother   . Colon polyps Mother   . Bipolar disorder Brother   . Kidney disease Brother   . Alcohol abuse Brother   . Hypothyroidism Daughter   . Heart disease Maternal Grandmother   . Diabetes Maternal Grandmother   . Cancer Maternal Grandmother        SKIN  . Hyperlipidemia Maternal Grandmother   . Hypertension Maternal Grandmother   . Hypothyroidism Maternal Grandmother   . Lung cancer Maternal Grandfather   .  Heart disease Maternal Aunt   . Diabetes Maternal Aunt   . Diabetes Cousin   . Colon cancer Neg Hx     Social History:  Social History   Socioeconomic History  . Marital status: Married    Spouse name: Not on file  . Number of children: 2  . Years of education: Not on file  . Highest education level: Not on file  Occupational History  . Occupation: Magazine features editor: [employer]  Social Needs  . Financial resource strain: Not on file  . Food insecurity:    Worry: Not on file    Inability: Not on file  . Transportation needs:    Medical: Not on file    Non-medical: Not on file  Tobacco Use  .  Smoking status: Never Smoker  . Smokeless tobacco: Never Used  Substance and Sexual Activity  . Alcohol use: No    Alcohol/week: 0.0 oz    Comment: occ  . Drug use: No  . Sexual activity: Yes    Partners: Male    Birth control/protection: None    Comment: husband has had a vasectomy  Lifestyle  . Physical activity:    Days per week: Not on file    Minutes per session: Not on file  . Stress: Not on file  Relationships  . Social connections:    Talks on phone: Not on file    Gets together: Not on file    Attends religious service: Not on file    Active member of club or organization: Not on file    Attends meetings of clubs or organizations: Not on file    Relationship status: Not on file  Other Topics Concern  . Not on file  Social History Narrative   4 caffeine drinks daily     Allergies:  Allergies  Allergen Reactions  . Lamictal [Lamotrigine] Rash  . Doxycycline Other (See Comments)    GI UPSET  . Effexor [Venlafaxine] Other (See Comments)    DYSPHORIA  . Prednisone Other (See Comments)    FLUSHED  . Savella [Milnacipran Hcl] Other (See Comments)    NO RELIEF    Metabolic Disorder Labs: Lab Results  Component Value Date   HGBA1C 5.9 (H) 03/08/2018   MPG 123 03/08/2018   MPG 111 03/08/2017   No results found for: PROLACTIN Lab Results  Component Value Date   CHOL 193 03/08/2018   TRIG 357 (H) 03/08/2018   HDL 44 (L) 03/08/2018   CHOLHDL 4.4 03/08/2018   VLDL 34 (H) 03/08/2017   LDLCALC 101 (H) 03/08/2018   LDLCALC 92 03/08/2017   Lab Results  Component Value Date   TSH 0.12 (L) 03/08/2018   TSH 0.47 12/21/2017    Therapeutic Level Labs: No results found for: LITHIUM No results found for: VALPROATE No components found for:  CBMZ  Current Medications: Current Outpatient Medications  Medication Sig Dispense Refill  . ALPRAZolam (XANAX) 0.5 MG tablet Take by mouth.    Marland Kitchen amitriptyline (ELAVIL) 10 MG tablet TAKE 1 TABLET BY MOUTH UP TO 3 TIMES A  DAY AS NEEDED FOR NERVE PAIN 270 tablet 1  . amoxicillin (AMOXIL) 875 MG tablet Take 875 mg by mouth 2 (two) times daily.    . ARIPiprazole ([name]) 10 MG tablet Take 1/2 tab daily for 1 week and thanfull tab daily (Patient taking differently: Take one tablet by mouth daily) 30 tablet 0  . Cholecalciferol (VITAMIN D) 2000 UNITS CAPS Take 8,000  Units by mouth daily.     Marland Kitchen desvenlafaxine (PRISTIQ) 100 MG 24 hr tablet Take 1 tablet (100 mg total) by mouth daily. 30 tablet 2  . dexamethasone (DECADRON) 0.5 MG tablet Take 1 tablet PO BID for 5 days and then 1 tablet PO QDaily for 5 days. 15 tablet 0  . Ferrous Sulfate (IRON) 325 (65 Fe) MG TABS Take by mouth 2 (two) times daily.    Marland Kitchen ibuprofen (ADVIL,MOTRIN) 200 MG tablet Take 200 mg by mouth every 6 (six) hours as needed for pain. Takes 4 tablets (800 mg) every 4-6 hrs prn    . metroNIDAZOLE (METROGEL VAGINAL) 0.75 % vaginal gel Place 1 Applicatorful vaginally at bedtime as needed. no alcohol or sex while using 70 g 0  . pravastatin (PRAVACHOL) 40 MG tablet TAKE 1 TABLET BY MOUTH DAILY 90 tablet 1  . promethazine-dextromethorphan (PROMETHAZINE-DM) 6.25-15 MG/5ML syrup Take 5 mLs by mouth 4 (four) times daily as needed for cough. 240 mL 1  . SYNTHROID 150 MCG tablet TAKE 1 TABLET BY MOUTH EVERY DAY BEFORE BREAKFAST 90 tablet 1  . vitamin E (VITAMIN E) 400 UNIT capsule Take 800 Units by mouth daily.     No current facility-administered medications for this visit.      Musculoskeletal: Strength & Muscle Tone: within normal limits Gait & Station: normal Patient leans: N/A  Psychiatric Specialty Exam: ROS  Blood pressure 128/74, pulse 88, height  (1.549 m), weight 200 lb (90.7 kg).There is no height or weight on file to calculate BMI.  General Appearance: Casual  Eye Contact:  Fair  Speech:  Slow  Volume:  Decreased  Mood:  Tired  Affect:  Congruent  Thought Process:  Goal Directed  Orientation:  Full (Time, Place, and Person)  Thought  Content: Logical and Rumination   Suicidal Thoughts:  No  Homicidal Thoughts:  No  Memory:  Immediate;   Good Recent;   Good Remote;   Good  Judgement:  Good  Insight:  Present  Psychomotor Activity:  Decreased  Concentration:  Concentration: Fair and Attention Span: Fair  Recall:  Good  Fund of Knowledge: Good  Language: Good  Akathisia:  No  Handed:  Right  AIMS (if indicated): not done  Assets:  Communication Skills Desire for Improvement Housing Resilience Social Support Talents/Skills Transportation  ADL's:  Intact  Cognition: WNL  Sleep:  Too much   Screenings: PHQ2-9     Office Visit from 12/28/2017 in [facility]  PHQ-2 Total Score  3  PHQ-9 Total Score  13       Assessment and Plan: Major depressive disorder, recurrent.  Generalized anxiety disorder.  I review genetic studies results.  She is taking Pristiq and [name] which is appropriate.  I recommend to discontinue amitriptyline which she had never started.  Amitriptyline can cause drug drug interaction according to her results.  I also encourage she should discuss with the neurology on her next appointment about narcolepsy.  She has frequent napping and daytime sleeping.  She does not feel CPAP helping her.  Recommended to continue Pristiq 100 mg daily and [name] 10 mg.  She is feeling better from the past.  She is taking Xanax as needed prescribed by primary care physician.  I reviewed the results of gene testing with her.  Recommended to call us back if she has any question, concerns revealed worsening of the symptoms.  Follow-up in 3 months.   Cleotis Nipper, MD 04/12/2018, 4:11 PM

## 2018-05-14 ENCOUNTER — Other Ambulatory Visit (HOSPITAL_COMMUNITY): Payer: Self-pay | Admitting: Psychiatry

## 2018-05-14 DIAGNOSIS — F331 Major depressive disorder, recurrent, moderate: Secondary | ICD-10-CM

## 2018-05-22 NOTE — Progress Notes (Signed)
[facility]  PATIENT: Lauren [name] DOB: Jul 06, 1981   REASON FOR VISIT: Follow-up for newly diagnosed obstructive sleep apnea here for initial CPAP HISTORY FROM:patient    HISTORY OF PRESENT ILLNESS:UPDATE 7/3/2019CM Ms.Daughtridge 37 year old female returns for follow-up with history of obstructive apnea.  Patient also has a history of sleep paralysis but no reasons.  She was unable to complete the MSLT needed for diagnosis of narcolepsy.  She must first be compliant with CPAP.  CPAP data dated 04/22/2018-05/21/2018 shows compliance greater than 4 hours at 63%.  Average usage 6 hours 19 minutes.  Set pressure 5 to 12 cm.  EPR level 3.  Leak 95th percentile 8.1.  AHI 3.1 ESS 11.  She returns for reevaluation Lauren [name] is a 37 y.o. female , seen here as in a referral from Dr. Oneta Rack for a sleep evaluation. I had the pleasure of meeting with [facility] before about 6-1/2 years ago she had undergone a sleep study at [facility] sleep.  She has been followed by Dr. Anne Hahn at the time who also ordered a sleep study.  In the meantime she has been followed by her primary care physician, and has seen Dr. Lolly Mustache.  She has a diagnosis of hypothyroidism, fibromyalgia, excessive fatigue.  She reports that she had been under more stress at work and at home.  The patient is also a mother of 2 with her youngest son will be 84 years old.  After the birth of either she experienced temporary spells of weakness.  With each of the deliveries she had received magnesium, which can be the most likely cause for perinatal weakness.  Chief complaint according to patient : "depression, leaden fatigue, weight gain, and myalgia" I sometimes feel it coming down on me and I just need to sleep, have taken 2 naps while waiting here."  Sleep habits are as follows: The patient usually goes to bed around 8:30 PM, she takes Xanax at bedtime and is promptly asleep within 20-30 minutes.  The bedroom is  cool, quiet and dark.  She prefers to sleep on her side or prone , and sleeps on one pillow.  I sleeps through the night, wakes up at 6 AM, on weekends she stays much longer in bed -until 10 AM. She estimates 9 hours of sleep on average. She wakes without headaches or dry mouth. She dreams a lot, has one or none bathroom break. Her husband noted her just to have started to snore, and suspected apnea.  She naps frequently in daytime. She works as a Runner, broadcasting/film/video in [employer], product/process development. Her naps are 3-5 minutes long.  Excessive daytime sleepiness and fatigue, 3-5 minute power naps, wakes up from her own snoring. Sleep paralysis in the past, none recent. Immediate dreams when napping. She cannot recall cataplectic spells.     REVIEW OF SYSTEMS: Full 14 system review of systems performed and notable only for those listed, all others are neg:  Constitutional: neg  Cardiovascular: neg Ear/Nose/Throat: neg  Skin: neg Eyes: neg Respiratory: neg Gastroitestinal: neg  Hematology/Lymphatic: neg  Endocrine: neg Musculoskeletal:neg Allergy/Immunology: neg Neurological: neg Psychiatric: Depression treated by psychiatry Sleep : Obstructive sleep apnea with CPAP, episodes of sleep paralysis in the past   ALLERGIES: Allergies  Allergen Reactions  . Lamictal [Lamotrigine] Rash  . Doxycycline Other (See Comments)    GI UPSET  . Effexor [Venlafaxine] Other (See Comments)    DYSPHORIA  . Prednisone Other (See Comments)    FLUSHED  .  Savella [Milnacipran Hcl] Other (See Comments)    NO RELIEF    HOME MEDICATIONS: Outpatient Medications Prior to Visit  Medication Sig Dispense Refill  . ALPRAZolam (XANAX) 0.5 MG tablet Take by mouth.    . ARIPiprazole ([name]) 10 MG tablet Take one tablet by mouth daily 30 tablet 2  . Cholecalciferol (VITAMIN D) 2000 UNITS CAPS Take 8,000 Units by mouth daily.     Marland Kitchen desvenlafaxine (PRISTIQ) 100 MG 24 hr tablet Take 1 tablet (100 mg total) by mouth  daily. 30 tablet 2  . Ferrous Sulfate (IRON) 325 (65 Fe) MG TABS Take by mouth 2 (two) times daily.    Marland Kitchen ibuprofen (ADVIL,MOTRIN) 200 MG tablet Take 200 mg by mouth every 6 (six) hours as needed for pain. Takes 4 tablets (800 mg) every 4-6 hrs prn    . metroNIDAZOLE (METROGEL VAGINAL) 0.75 % vaginal gel Place 1 Applicatorful vaginally at bedtime as needed. no alcohol or sex while using 70 g 0  . pravastatin (PRAVACHOL) 40 MG tablet TAKE 1 TABLET BY MOUTH DAILY 90 tablet 1  . SYNTHROID 150 MCG tablet TAKE 1 TABLET BY MOUTH EVERY DAY BEFORE BREAKFAST 90 tablet 1  . vitamin E (VITAMIN E) 400 UNIT capsule Take 800 Units by mouth daily.    Marland Kitchen dexamethasone (DECADRON) 0.5 MG tablet Take 1 tablet PO BID for 5 days and then 1 tablet PO QDaily for 5 days. 15 tablet 0  . promethazine-dextromethorphan (PROMETHAZINE-DM) 6.25-15 MG/5ML syrup Take 5 mLs by mouth 4 (four) times daily as needed for cough. 240 mL 1   No facility-administered medications prior to visit.     PAST MEDICAL HISTORY: Past Medical History:  Diagnosis Date  . Anemia   . Anxiety   . Constipation   . Depression   . Fibromyalgia   . GERD (gastroesophageal reflux disease)   . Hemorrhoids   . HSV-1 infection   . Hypertension   . Hypothyroidism   . OSA on CPAP   . Other abnormal glucose   . Preeclampsia     PAST SURGICAL HISTORY: Past Surgical History:  Procedure Laterality Date  . CHOLECYSTECTOMY  2006  . TONSILLECTOMY    . WISDOM TOOTH EXTRACTION      FAMILY HISTORY: Family History  Problem Relation Age of Onset  . Hypothyroidism Mother   . Cervical cancer Mother   . Bipolar disorder Mother   . Diabetes Mother   . Colon polyps Mother   . Bipolar disorder Brother   . Kidney disease Brother   . Alcohol abuse Brother   . Hypothyroidism Daughter   . Heart disease Maternal Grandmother   . Diabetes Maternal Grandmother   . Cancer Maternal Grandmother        SKIN  . Hyperlipidemia Maternal Grandmother   .  Hypertension Maternal Grandmother   . Hypothyroidism Maternal Grandmother   . Lung cancer Maternal Grandfather   . Heart disease Maternal Aunt   . Diabetes Maternal Aunt   . Diabetes Cousin   . Colon cancer Neg Hx     SOCIAL HISTORY: Social History   Socioeconomic History  . Marital status: Married    Spouse name: Not on file  . Number of children: 2  . Years of education: Not on file  . Highest education level: Not on file  Occupational History  . Occupation: Magazine features editor: [employer]  Social Needs  . Financial resource strain: Not on file  . Food insecurity:  Worry: Not on file    Inability: Not on file  . Transportation needs:    Medical: Not on file    Non-medical: Not on file  Tobacco Use  . Smoking status: Never Smoker  . Smokeless tobacco: Never Used  Substance and Sexual Activity  . Alcohol use: No    Alcohol/week: 0.0 oz    Comment: occ  . Drug use: No  . Sexual activity: Yes    Partners: Male    Birth control/protection: None    Comment: husband has had a vasectomy  Lifestyle  . Physical activity:    Days per week: Not on file    Minutes per session: Not on file  . Stress: Not on file  Relationships  . Social connections:    Talks on phone: Not on file    Gets together: Not on file    Attends religious service: Not on file    Active member of club or organization: Not on file    Attends meetings of clubs or organizations: Not on file    Relationship status: Not on file  . Intimate partner violence:    Fear of current or ex partner: Not on file    Emotionally abused: Not on file    Physically abused: Not on file    Forced sexual activity: Not on file  Other Topics Concern  . Not on file  Social History Narrative   4 caffeine drinks daily      PHYSICAL EXAM  Vitals:   05/23/18 1053  BP: 124/82  Pulse: 77  Weight: 200 lb (90.7 kg)  Height: 5\' 1"  (1.549 m)   Body mass index is 37.79 kg/m.  Generalized: Well  developed, in no acute distress  Head: normocephalic and atraumatic,. Oropharynx benign mallopatti 5 Neck: Supple, 15.5 circumference   Lungs clear Skin no peripheral edema Musculoskeletal: No deformity   Neurological examination   Mentation: Alert oriented to time, place, history taking. Attention span and concentration appropriate. Recent and remote memory intact.  Follows all commands speech and language fluent. ESS 11  Cranial nerve II-XII: Pupils were equal round reactive to light extraocular movements were full, visual field were full on confrontational test. Facial sensation and strength were normal. hearing was intact to finger rubbing bilaterally. Uvula tongue midline. head turning and shoulder shrug were normal and symmetric.Tongue protrusion into cheek strength was normal. Motor: normal bulk and tone, full strength in the BUE, BLE,  Sensory: normal and symmetric to light touch,  Coordination: finger-nose-finger, heel-to-shin bilaterally, no dysmetria Gait and Station: Rising up from seated position without assistance, normal stance,  moderate stride, good arm swing, smooth turning, able to perform tiptoe, and heel walking without difficulty. Tandem gait is steady  DIAGNOSTIC DATA (LABS, IMAGING, TESTING) - I reviewed patient records, labs, notes, testing and imaging myself where available.  Lab Results  Component Value Date   WBC 8.9 03/08/2018   HGB 13.3 03/08/2018   HCT 39.2 03/08/2018   MCV 84.8 03/08/2018   PLT 213 03/08/2018      Component Value Date/Time   NA 140 03/08/2018 1608   K 3.9 03/08/2018 1608   CL 102 03/08/2018 1608   CO2 29 03/08/2018 1608   GLUCOSE 102 (H) 03/08/2018 1608   BUN 14 03/08/2018 1608   CREATININE 0.94 03/08/2018 1608   CALCIUM 9.6 03/08/2018 1608   PROT 7.3 03/08/2018 1608   ALBUMIN 4.4 06/01/2017 0936   AST 19 03/08/2018 1608   ALT 22 03/08/2018 1608  ALKPHOS 66 06/01/2017 0936   BILITOT 0.3 03/08/2018 1608   GFRNONAA 76  03/08/2018 1608   GFRAA 89 03/08/2018 1608   Lab Results  Component Value Date   CHOL 193 03/08/2018   HDL 44 (L) 03/08/2018   LDLCALC 101 (H) 03/08/2018   LDLDIRECT 114.9 04/30/2012   TRIG 357 (H) 03/08/2018   CHOLHDL 4.4 03/08/2018   Lab Results  Component Value Date   HGBA1C 5.9 (H) 03/08/2018   Lab Results  Component Value Date   VITAMINB12 551 12/21/2017   Lab Results  Component Value Date   TSH 0.12 (L) 03/08/2018      ASSESSMENT AND PLAN  37 y.o. year old female here to follow-up for obstructive sleep apnea with initial CPAP.Data dated 04/22/2018-05/21/2018 shows compliance greater than 4 hours at 63%.  Average usage 6 hours 19 minutes.  Set pressure 5 to 12 cm.  EPR level 3.  Leak 95th percentile 8.1.  AHI 3.1 ESS 11  CPAP compliance 63% Continue settings Follow-up in 2 months for repeat compliance then MSLT will be set up for dx narcolepsy Made aware to have MSLT must be off [name] and Pristiq for 14 days check with prescribing provider how to titrate off Nilda RiggsNancy Carolyn Bedelia Pong, [facility] Community HospitalGNP, [facility] CenterBC, APRN  [facility] [address], [address] [place], [place] [address] [phone]

## 2018-05-23 ENCOUNTER — Ambulatory Visit: Payer: BC Managed Care – PPO | Admitting: Nurse Practitioner

## 2018-05-23 ENCOUNTER — Encounter: Payer: Self-pay | Admitting: Nurse Practitioner

## 2018-05-23 DIAGNOSIS — Z9989 Dependence on other enabling machines and devices: Secondary | ICD-10-CM | POA: Diagnosis not present

## 2018-05-23 DIAGNOSIS — G4733 Obstructive sleep apnea (adult) (pediatric): Secondary | ICD-10-CM

## 2018-05-23 NOTE — Patient Instructions (Addendum)
CPAP compliance 63% Continue settings Follow-up in 2 months for repeat compliance then MSLT will be set up for dx narcolepsy Made aware to have MSLT must be off [name] and Pristiq for 14 days check with prescribing provider how to titrate off

## 2018-06-03 NOTE — Progress Notes (Signed)
Assessment and Plan:  Lauren [name] was seen today for fibromyalgia.  Diagnoses and all orders for this visit:  Hypothyroidism, unspecified type Due for recheck thyroid after dose adjustment -     TSH        Fibromyalgia Emphasized lifestyle, discussed opioids not indicated by current guidelines Try adding daily antiinflammatory, tumeric + bioprene or tart cherry supplement suggested Stress management techniques discussed, increase water, good sleep hygiene discussed, increase exercise, and increase veggies.  Discuss psych medications with Dr. Evelene Croon to optimize depression medication for pain management  -     predniSONE (DELTASONE) 20 MG tablet; 2 tablets daily for 3 days, 1 tablet daily for 4 days. Can refer back to rheumatology if ongoing issues for management  Screening for tuberculosis -     TB Skin Test  Further disposition pending results of labs. Discussed med's effects and SE's.   Over 30 minutes of exam, counseling, chart review, and critical decision making was performed.   Future Appointments  Date Time Provider Department Center  07/17/2018  4:45 PM Arfeen, Phillips Grout, MD [id number] None  09/10/2018  8:45 AM Nilda Riggs, NP [facility] None  09/13/2018  4:30 PM Quentin Mulling, PA-C [facility] None  03/12/2019  3:00 PM Quentin Mulling, PA-C [facility] None    ------------------------------------------------------------------------------------------------------------------  HPI BP 116/86   Pulse 82   Temp 97.7 F (36.5 C)   Ht 5\' 1"  (1.549 m)   Wt 204 lb (92.5 kg)   SpO2 98%   BMI 38.55 kg/m   37 y.o.female , Caucasian, morbidly obese, presents for evaluation of worsening symptoms of fibromyalgia (reports diagnosed several years ago ~8 years), has seen rheumatology Dr. Corliss Skains remotely and was on many supplement but couldn't afford, and didn't particularly help. She reports she has had a "flare" for past 3 weeks or so (consistent with previous symptoms, no new symptoms),  under increased stress r/t job. She does not currently take any medications for pain. He pain is widespread, reports is "aching" and "tender" all over, worst in upper back/between shoulder blades. She is wondering if there are any new medications out for fibro since she last saw Dr. Corliss Skains.   She has hx of OSA on sleep apnea, had extensive problems with hypersomnia, has depression/anxiety managed by Dr. Kathi Ludwig, currently on abilify, pristiq, PRN xanax; she has not discussed fibro with her psych provider and is currently in process of transitioning to care under Dr. Evelene Croon.   She has hx of hypothyroid, treated by synthroid, most recent TSH abnormal, medication dose was adjusted:  Lab Results  Component Value Date   TSH 0.12 (L) 03/08/2018   She also presents with screening health paperwork to be filled out for her new job; needs TB screening today, all others up to date.     Past Medical History:  Diagnosis Date  . Anemia   . Anxiety   . Constipation   . Depression   . Fibromyalgia   . GERD (gastroesophageal reflux disease)   . Hemorrhoids   . HSV-1 infection   . Hypertension   . Hypothyroidism   . OSA on CPAP   . Other abnormal glucose   . Preeclampsia      Allergies  Allergen Reactions  . Lamictal [Lamotrigine] Rash  . Doxycycline Other (See Comments)    GI UPSET  . Effexor [Venlafaxine] Other (See Comments)    DYSPHORIA  . Prednisone Other (See Comments)    FLUSHED  . Savella [Milnacipran Hcl] Other (See Comments)  NO RELIEF    Current Outpatient Medications on File Prior to Visit  Medication Sig  . ALPRAZolam (XANAX) 0.5 MG tablet Take by mouth as needed.   . ARIPiprazole ([name]) 10 MG tablet Take one tablet by mouth daily  . Cholecalciferol (VITAMIN D) 2000 UNITS CAPS Take 8,000 Units by mouth daily.   Marland Kitchen. desvenlafaxine (PRISTIQ) 100 MG 24 hr tablet Take 1 tablet (100 mg total) by mouth daily.  . Ferrous Sulfate (IRON) 325 (65 Fe) MG TABS Take by mouth 2 (two)  times daily.  Marland Kitchen. ibuprofen (ADVIL,MOTRIN) 200 MG tablet Take 200 mg by mouth every 6 (six) hours as needed for pain. Takes 4 tablets (800 mg) every 4-6 hrs prn  . metroNIDAZOLE (METROGEL VAGINAL) 0.75 % vaginal gel Place 1 Applicatorful vaginally at bedtime as needed. no alcohol or sex while using  . pravastatin (PRAVACHOL) 40 MG tablet TAKE 1 TABLET BY MOUTH DAILY  . SYNTHROID 150 MCG tablet TAKE 1 TABLET BY MOUTH EVERY DAY BEFORE BREAKFAST  . vitamin E (VITAMIN E) 400 UNIT capsule Take 800 Units by mouth daily.   No current facility-administered medications on file prior to visit.     ROS: Review of Systems  Constitutional: Positive for malaise/fatigue. Negative for chills, diaphoresis, fever and weight loss.  HENT: Negative.   Eyes: Negative.   Respiratory: Negative.   Cardiovascular: Negative.   Gastrointestinal: Negative for abdominal pain, constipation, diarrhea, nausea and vomiting.  Genitourinary: Negative.   Musculoskeletal: Positive for myalgias (Localized, tender, worse through shoulders, between shoulder blades than thighs). Negative for joint pain.  Skin: Negative for itching and rash.  Neurological: Negative for dizziness, tingling, tremors, sensory change, focal weakness, weakness and headaches.  Endo/Heme/Allergies: Negative for polydipsia. Does not bruise/bleed easily.  Psychiatric/Behavioral: Positive for depression. The patient is nervous/anxious. The patient does not have insomnia (Sleeps too much).      Physical Exam:  BP 116/86   Pulse 82   Temp 97.7 F (36.5 C)   Ht 5\' 1"  (1.549 m)   Wt 204 lb (92.5 kg)   SpO2 98%   BMI 38.55 kg/m   General Appearance: Well nourished, in no apparent distress. Eyes: PERRLA, EOMs, conjunctiva no swelling or erythema Sinuses: No Frontal/maxillary tenderness ENT/Mouth: Ext aud canals clear, TMs without erythema, bulging. No erythema, swelling, or exudate on post pharynx.  Tonsils not swollen or erythematous. Hearing normal.   Neck: Supple, thyroid normal.  Respiratory: Respiratory effort normal, BS equal bilaterally without rales, rhonchi, wheezing or stridor.  Cardio: RRR with no MRGs. Brisk peripheral pulses without edema.  Abdomen: Soft, + BS.  Non tender, no guarding, rebound, hernias, masses. Lymphatics: Non tender without lymphadenopathy.  Musculoskeletal: Full ROM, 5/5 strength, normal gait. She is tender through mid/upper paraspinals, deltoids/upper arms, overall tender.  Skin: Warm, dry without rashes, lesions, ecchymosis.  Neuro: Cranial nerves intact. Normal muscle tone, no cerebellar symptoms. Sensation intact.  Psych: Awake and oriented X 3, depressed affect, Insight and Judgment appropriate.     Dan MakerAshley C Cerria Randhawa, NP 3:43 PM [facility]

## 2018-06-04 ENCOUNTER — Ambulatory Visit: Payer: BC Managed Care – PPO | Admitting: Adult Health

## 2018-06-04 ENCOUNTER — Encounter: Payer: Self-pay | Admitting: Adult Health

## 2018-06-04 VITALS — BP 116/86 | HR 82 | Temp 97.7°F | Ht 61.0 in | Wt 204.0 lb

## 2018-06-04 DIAGNOSIS — Z111 Encounter for screening for respiratory tuberculosis: Secondary | ICD-10-CM | POA: Diagnosis not present

## 2018-06-04 DIAGNOSIS — E039 Hypothyroidism, unspecified: Secondary | ICD-10-CM

## 2018-06-04 DIAGNOSIS — M797 Fibromyalgia: Secondary | ICD-10-CM | POA: Diagnosis not present

## 2018-06-04 MED ORDER — PREDNISONE 20 MG PO TABS: ORAL_TABLET | ORAL | 0 refills | Status: DC

## 2018-06-04 NOTE — Patient Instructions (Addendum)
Try getting on tumeric + bioprene (black pepper fruit) OR a tart cherry supplement daily - natural antiinflammatory   Ibuprofen/aleve as needed  Talk to Dr. Evelene CroonKaur about SSRIs or other that may help with fibromyalgia   Try to follow whole foods, plant based diet, avoid alcohol, sugars, flour, etc as much as possible  Routine and good lifestyle are the best things you can do at this point for fibro  Aim for 7+ servings of fruits and vegetables daily  80+ fluid ounces of water or unsweet tea for healthy kidneys  Avoid smoking, alcohol   Limit animal fats in diet for cholesterol and heart health - choose grass fed whenever available  Aim for low stress - take time to unwind and care for your mental health  Aim for 150 min of moderate intensity exercise weekly for heart health, and weights twice weekly for bone health  Aim for 7-9 hours of sleep daily      When it comes to diets, agreement about the perfect plan isn't easy to find, even among the experts. Experts at the [employer] developed an idea known as the Healthy Eating Plate. Just imagine a plate divided into logical, healthy portions.  The emphasis is on diet quality:  Load up on vegetables and fruits - one-half of your plate: Aim for color and variety, and remember that potatoes don't count.  Go for whole grains - one-quarter of your plate: Whole wheat, barley, wheat berries, quinoa, oats, brown rice, and foods made with them. If you want pasta, go with whole wheat pasta.  Protein power - one-quarter of your plate: Fish, chicken, beans, and nuts are all healthy, versatile protein sources. Limit red meat.  The diet, however, does go beyond the plate, offering a few other suggestions.  Use healthy plant oils, such as olive, canola, soy, corn, sunflower and peanut. Check the labels, and avoid partially hydrogenated oil, which have unhealthy trans fats.  If you're thirsty, drink water. Coffee and tea  are good in moderation, but skip sugary drinks and limit milk and dairy products to one or two daily servings.  The type of carbohydrate in the diet is more important than the amount. Some sources of carbohydrates, such as vegetables, fruits, whole grains, and beans-are healthier than others.  Finally, stay active.

## 2018-06-04 NOTE — Progress Notes (Signed)
Tuberculin skin test applied to right ventral forearm. Explained how to read the test, measuring induration not just erythema; she will return in two days for results.

## 2018-06-05 ENCOUNTER — Other Ambulatory Visit: Payer: Self-pay | Admitting: Adult Health

## 2018-06-05 LAB — TSH: TSH: 4.22 mIU/L

## 2018-06-19 ENCOUNTER — Encounter (HOSPITAL_COMMUNITY): Payer: Self-pay

## 2018-06-19 ENCOUNTER — Ambulatory Visit: Payer: BC Managed Care – PPO | Admitting: Internal Medicine

## 2018-06-19 ENCOUNTER — Emergency Department (HOSPITAL_COMMUNITY)
Admission: EM | Admit: 2018-06-19 | Discharge: 2018-06-19 | Disposition: A | Discharge status: Home / Self Care | Payer: BC Managed Care – PPO | Attending: Emergency Medicine | Admitting: Emergency Medicine

## 2018-06-19 VITALS — BP 116/80 | HR 72 | Temp 97.6°F | Resp 16 | Ht 61.0 in | Wt 200.8 lb

## 2018-06-19 DIAGNOSIS — R1032 Left lower quadrant pain: Secondary | ICD-10-CM

## 2018-06-19 DIAGNOSIS — R1012 Left upper quadrant pain: Secondary | ICD-10-CM

## 2018-06-19 LAB — URINALYSIS, ROUTINE W REFLEX MICROSCOPIC
BACTERIA UA: NONE SEEN
Bilirubin Urine: NEGATIVE
Glucose, UA: NEGATIVE mg/dL
Ketones, ur: NEGATIVE mg/dL
NITRITE: NEGATIVE
Protein, ur: NEGATIVE mg/dL
Specific Gravity, Urine: 1.002 — ABNORMAL LOW (ref 1.005–1.030)
pH: 6 (ref 5.0–8.0)

## 2018-06-19 LAB — CBC
HEMATOCRIT: 41.2 % (ref 36.0–46.0)
Hemoglobin: 13.8 g/dL (ref 12.0–15.0)
MCH: 29 pg (ref 26.0–34.0)
MCHC: 33.5 g/dL (ref 30.0–36.0)
MCV: 86.6 fL (ref 78.0–100.0)
Platelets: 168 10*3/uL (ref 150–400)
RBC: 4.76 MIL/uL (ref 3.87–5.11)
RDW: 13.3 % (ref 11.5–15.5)
WBC: 9.5 10*3/uL (ref 4.0–10.5)

## 2018-06-19 LAB — I-STAT BETA HCG BLOOD, ED (MC, WL, AP ONLY)

## 2018-06-19 LAB — COMPREHENSIVE METABOLIC PANEL
ALBUMIN: 4.3 g/dL (ref 3.5–5.0)
ALT: 39 U/L (ref 0–44)
ANION GAP: 11 (ref 5–15)
AST: 41 U/L (ref 15–41)
Alkaline Phosphatase: 56 U/L (ref 38–126)
BILIRUBIN TOTAL: 1 mg/dL (ref 0.3–1.2)
BUN: 10 mg/dL (ref 6–20)
CHLORIDE: 105 mmol/L (ref 98–111)
CO2: 24 mmol/L (ref 22–32)
Calcium: 9.6 mg/dL (ref 8.9–10.3)
Creatinine, Ser: 0.85 mg/dL (ref 0.44–1.00)
GFR calc Af Amer: >60 mL/min (ref >60)
GFR calc non Af Amer: >60 mL/min (ref >60)
GLUCOSE: 179 mg/dL — AB (ref 70–99)
POTASSIUM: 3.6 mmol/L (ref 3.5–5.1)
SODIUM: 140 mmol/L (ref 135–145)
TOTAL PROTEIN: 7.8 g/dL (ref 6.5–8.1)

## 2018-06-19 LAB — LIPASE, BLOOD: LIPASE: 82 U/L — AB (ref 11–51)

## 2018-06-19 MED ORDER — DICYCLOMINE HCL 20 MG PO TABS: 20.0000 mg | ORAL_TABLET | Freq: Two times a day (BID) | ORAL | 0 refills | Status: DC

## 2018-06-19 MED ORDER — DICYCLOMINE HCL 10 MG/ML IM SOLN
20.0000 mg | Freq: Once | INTRAMUSCULAR | Status: AC
  Administered 2018-06-19: 20 mg via INTRAMUSCULAR
  Filled 2018-06-19: qty 2

## 2018-06-19 MED ORDER — SODIUM CHLORIDE 0.9 % IV BOLUS
1000.0000 mL | Freq: Once | INTRAVENOUS | Status: AC
  Administered 2018-06-19: 1000 mL via INTRAVENOUS

## 2018-06-19 NOTE — ED Provider Notes (Signed)
[facility]-EMERGENCY DEPT Provider Note   CSN: [id number] Arrival date & time: 06/19/18  1448     History   Chief Complaint Chief Complaint  Patient presents with  . Abdominal Pain    HPI Lauren [name] is a 37 y.o. female.  Lauren [name] is a 37 y.o. Female with history of anemia, constipation, GERD, HTN, fibromyalgia, anxiety and depression, presents to the ED for evaluation of 6 days of left sided abdominal pain. Pt reports pain has been present over the left side of the abdomen primarily in the upper quadrants, it has been constant. Pt reports over the past 6 days it has not gotten any, worse but is not improving. She reports associated nausea and decreased appetite, no emesis. Pt has had some loose stools, no melena or hematochezia. Pt denies urinary symptoms, no vaginal bleeding or discharge. Pt had CT done last Friday, after concern for possible kidney stone. Chart review shows scan showed no evidence of stone, no acute intraabdominal pathology to explain pain, diverticula w/o evidence of diverticulitis, right ovarian cyst and fatty liver. Pt follow up with PCP today regarding results and was referred to ED for further evaluation. She has been using hydrocodone for pain with minimal relief, reports she thinks they gave her something for spasms on Friday that seemed to help more, unsure of medication.      Past Medical History:  Diagnosis Date  . Anemia   . Anxiety   . Constipation   . Depression   . Fibromyalgia   . GERD (gastroesophageal reflux disease)   . Hemorrhoids   . HSV-1 infection   . Hypertension   . Hypothyroidism   . OSA on CPAP   . Other abnormal glucose   . Preeclampsia     Patient Active Problem List   Diagnosis Date Noted  . Fibromyalgia 06/04/2018  . Obstructive sleep apnea treated with continuous positive airway pressure (CPAP) 05/23/2018  . 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 Hypersomnia with sleep apnea  01/16/2018  . Hepatomegaly 12/21/2017  . Hepatic steatosis 12/21/2017  . Headache associated with sexual activity 03/08/2017  . Dizziness 03/08/2017  . Medication management 09/09/2015  . Prediabetes 06/02/2015  . Hyperlipidemia LDL goal <100 06/02/2015  . Morbid obesity (HCC) 06/02/2015  . Depression   . Hypertension   . Hypothyroidism 04/30/2012  . 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 Paraparesis (HCC) 02/02/2012    Past Surgical History:  Procedure Laterality Date  . CHOLECYSTECTOMY  2006  . TONSILLECTOMY    . WISDOM TOOTH EXTRACTION       OB History    Gravida  2   Para  2   Term      Preterm      AB      Living        SAB      TAB      Ectopic      Multiple      Live Births               Home Medications    Prior to Admission medications   Medication Sig Start Date End Date Taking? Authorizing Provider  ALPRAZolam Prudy Feeler) 0.5 MG tablet Take 0.5 mg by mouth as needed.    Yes [provider]  ARIPiprazole ([name]) 10 MG tablet Take one tablet by mouth daily Patient taking differently: Take 10 mg by mouth daily. Take one tablet by mouth daily 04/12/18  Yes Arfeen, Phillips Grout,  MD  Cholecalciferol (VITAMIN D) 2000 UNITS CAPS Take 4,000 Units by mouth daily.    Yes [provider]  desvenlafaxine (PRISTIQ) 100 MG 24 hr tablet Take 1 tablet (100 mg total) by mouth daily. 04/12/18  Yes Arfeen, Phillips Grout, MD  Ferrous Sulfate (IRON) 325 (65 Fe) MG TABS Take by mouth 2 (two) times daily.   Yes [provider]  HYDROcodone-acetaminophen (NORCO) 5-325 MG tablet Take 1 tablet by mouth every 6 (six) hours as needed.   Yes [provider]  ibuprofen (ADVIL,MOTRIN) 800 MG tablet Take 1 tablet by mouth 3 (three) times daily as needed.   Yes [provider]  metroNIDAZOLE (METROGEL VAGINAL) 0.75 % vaginal gel Place 1 Applicatorful vaginally at bedtime as needed. no alcohol or sex while using 12/28/17  Yes Booker, Cala Bradford R,  CNM  pravastatin (PRAVACHOL) 40 MG tablet TAKE 1 TABLET BY MOUTH DAILY 02/21/18  Yes Quentin Mulling, PA-C  SYNTHROID 150 MCG tablet TAKE 1 TABLET BY MOUTH EVERY DAY BEFORE BREAKFAST 02/02/18  Yes Lucky Cowboy, MD  vitamin E (VITAMIN E) 400 UNIT capsule Take 800 Units by mouth daily.   Yes [provider]  dicyclomine (BENTYL) 20 MG tablet Take 1 tablet (20 mg total) by mouth 2 (two) times daily. 06/19/18   Dartha Lodge, PA-C    Family History Family History  Problem Relation Age of Onset  . Hypothyroidism Mother   . Cervical cancer Mother   . Bipolar disorder Mother   . Diabetes Mother   . Colon polyps Mother   . Bipolar disorder Brother   . Kidney disease Brother   . Alcohol abuse Brother   . Hypothyroidism Daughter   . Heart disease Maternal Grandmother   . Diabetes Maternal Grandmother   . Cancer Maternal Grandmother        SKIN  . Hyperlipidemia Maternal Grandmother   . Hypertension Maternal Grandmother   . Hypothyroidism Maternal Grandmother   . Lung cancer Maternal Grandfather   . Heart disease Maternal Aunt   . Diabetes Maternal Aunt   . Diabetes Cousin   . Colon cancer Neg Hx     Social History Social History   Tobacco Use  . Smoking status: Never Smoker  . Smokeless tobacco: Never Used  Substance Use Topics  . Alcohol use: No    Alcohol/week: 0.0 oz    Comment: occ  . Drug use: No     Allergies   Lamictal [lamotrigine]; Doxycycline; Effexor [venlafaxine]; Prednisone; and Savella [milnacipran hcl]   Review of Systems Review of Systems  Constitutional: Negative for chills and fever.  HENT: Negative.   Eyes: Negative for visual disturbance.  Respiratory: Negative for cough and shortness of breath.   Cardiovascular: Negative for chest pain and leg swelling.  Gastrointestinal: Positive for abdominal pain, diarrhea and nausea. Negative for blood in stool and vomiting.  Genitourinary: Negative for dysuria, flank pain and frequency.    Musculoskeletal: Negative for arthralgias and myalgias.  Skin: Negative for color change, rash and wound.  Neurological: Negative for dizziness, syncope and light-headedness.     Physical Exam Updated Vital Signs BP 122/65 (BP Location: Left Arm)   Pulse 69   Temp 99.8 F (37.7 C) (Oral)   Resp 14   LMP 05/25/2018 (Approximate)   SpO2 100%   Physical Exam  Constitutional: She is oriented to person, place, and time. She appears well-developed and well-nourished.  Non-toxic appearance. She does not appear ill. No distress.  HENT:  Head: Normocephalic  and atraumatic.  Mouth/Throat: Oropharynx is clear and moist.  Eyes: Right eye exhibits no discharge. Left eye exhibits no discharge.  Cardiovascular: Normal rate, regular rhythm, normal heart sounds and intact distal pulses.  Pulmonary/Chest: Effort normal and breath sounds normal. No respiratory distress.  Respirations equal and unlabored, patient able to speak in full sentences, lungs clear to auscultation bilaterally  Abdominal: Soft. Normal appearance and bowel sounds are normal. She exhibits no distension. There is tenderness in the left upper quadrant and left lower quadrant. There is no rigidity, no rebound, no guarding and no CVA tenderness.  Abdomen soft, nondistended, tenderness to palpation in the LUQ and LLQ without guarding or rebound tenderness  Neurological: She is alert and oriented to person, place, and time. Coordination normal.  Skin: Skin is warm and dry. She is not diaphoretic.  Psychiatric: She has a normal mood and affect. Her behavior is normal.  Nursing note and vitals reviewed.    ED Treatments / Results  Labs (all labs ordered are listed, but only abnormal results are displayed) Labs Reviewed  LIPASE, BLOOD - Abnormal; Notable for the following components:      Result Value   Lipase 82 (*)    All other components within normal limits  COMPREHENSIVE METABOLIC PANEL - Abnormal; Notable for the following  components:   Glucose, Bld 179 (*)    All other components within normal limits  URINALYSIS, ROUTINE W REFLEX MICROSCOPIC - Abnormal; Notable for the following components:   Color, Urine STRAW (*)    Specific Gravity, Urine 1.002 (*)    Hgb urine dipstick SMALL (*)    Leukocytes, UA SMALL (*)    All other components within normal limits  CBC  I-STAT BETA HCG BLOOD, ED ([facility], [facility], [facility] ONLY)    EKG None  Radiology No results found.  Procedures Procedures (including critical care time)  Medications Ordered in ED Medications  sodium chloride 0.9 % bolus 1,000 mL (0 mLs Intravenous Stopped 06/19/18 2024)  dicyclomine (BENTYL) injection 20 mg (20 mg Intramuscular Given 06/19/18 1820)     Initial Impression / Assessment and Plan / ED Course  I have reviewed the triage vital signs and the nursing notes.  Pertinent labs & imaging results that were available during my care of the patient were reviewed by me and considered in my medical decision making (see chart for details).  Patient presents for evaluation of sided abdominal pain, not worsening but not improved over the last 6 days.  Had CT scan last Friday which showed no evidence of nephrolithiasis that other acute cause of the patient's pain, diverticula without diverticulitis, right-sided functional ovarian cyst and fatty liver.  Sent by PCP for further evaluation.  Vitals stable and patient is in no acute distress.  On exam lungs clear heart regular rate and rhythm, abdomen left-sided tenderness without focal guarding or rebound tenderness.    Abdominal labs obtained from triageand are overall reassuring.  No leukocytosis, normal hemoglobin.  Glucose is elevated but no other electrolyte derangements, normal renal and liver function.  Lipase is only slightly elevated at 82.  Urinalysis without signs of infection.  Given reassuring abdominal exam and laboratory evaluation and the pain has not worsened at all, I feel the likelihood of acute  surgical abdomen is unlikely.  Pain treated with Bentyl with significant improvement.  Fluid bolus given as well.  Discussed these reassuring findings with the patient I do not feel that repeat CT scan abdomen this time.  Will have  patient follow-up with GI, discharged with Bentyl and encouraged to use probiotic and drink plenty of fluids.  Return precautions discussed.  Patient expressed ending in agreement with the follow-up  Final Clinical Impressions(s) / ED Diagnoses   Final diagnoses:  Left upper quadrant pain    ED Discharge Orders        Ordered    dicyclomine (BENTYL) 20 MG tablet  2 times daily     06/19/18 1951       Dartha LodgeFord, Kelsey N, Cordelia Poche-C 06/20/18 0326    Charlynne PanderYao, David Hsienta, MD 06/23/18 [phone]

## 2018-06-19 NOTE — Progress Notes (Signed)
Subjective:    Patient ID: Lowella Petties, female    DOB: 1981/04/20, 37 y.o.   MRN: [id number]  HPI  Patient is a nice 37 yo MWF who presented as a walk-in today with a abd CT scan report from [facility].    Patient relates hx/o Low back discomfort about 9 days ago. 2 days later she dw=eveloped Lower Lt abdominal pain and was seen at [facility] with U/a (+_) for blood and labs were drawn. With suspicion of Lt renal colic she was Rx'd Vicodin prn & Ibuprofen 800 mg prn. She has experienced Nausea w/o emesis. Rates the pain as 5-7/10 & constant She had a CT Abd/Pelvis for stone protocol yester day & report is reviewed. Imp was No acute findings, fatty liver, with a 1.8 cm lesion in Lt hepatic lobe and a complex 3.9 cm Rt Ovarian cyst - felt likely physiologic, scatter diverticula w/o diverticulitis.     Patient can not define any factors that make the discomfort better or worse and no fever , chills of effect of BM's.   Medication Sig  . ALPRAZolam  0.5 MG  Take 0.5 mg by mouth as needed.   . [name] 10 MG tablet Take one tablet by mouth daily (Patient taking differently: Take 10 mg by mouth daily. Take one tablet by mouth daily)  . VITAMIN D 2000 UNITS Take 4,000 Units by mouth daily.   Marland Kitchen desvenlafaxine  100 MG 24 hr Take 1 tablet (100 mg total) by mouth daily.  . Ferrous Sulfate  325  MG Take by mouth 2 (two) times daily.  . NORCO 5-325 MG tablet Take 1 tablet by mouth every 6 (six) hours as needed.  . Ibuprofen 800 MG tablet Take 1 tablet by mouth 3 (three) times daily as needed.  Marland Kitchen METROGEL VAG 0.75 %  gel Place 1 Applicatorful vaginally at bedtime as needed.   . pravastatin  40 MG tab TAKE 1 TABLET BY MOUTH DAILY  . SYNTHROID 150 MCG  TAKE 1 TAB EVERY DAY BEFORE BREAKFAST  . vitamin E  400 UNIT cap Take 800 Units by mouth daily.  Marland Kitchen ibuprofen  200 MG tablet Takes 4 tablets (800 mg) every 4-6 hrs prn   Allergies  Allergen Reactions  . Lamictal [Lamotrigine] Rash  . Doxycycline Other  (See Comments)    GI UPSET  . Effexor [Venlafaxine] Other (See Comments)    DYSPHORIA  . Prednisone Other (See Comments)    FLUSHED  . Savella [Milnacipran Hcl] Other (See Comments)    NO RELIEF    <MPH could not be calculated without both parents' heights> Past Surgical History:  Procedure Laterality Date  . CHOLECYSTECTOMY  2006  . TONSILLECTOMY    . WISDOM TOOTH EXTRACTION     Review of Systems     10 point systems review negative except as above.    Objective:   Physical Exam  BP 116/80   Pulse 72   Temp 97.6 F (36.4 C)   Resp 16   Ht 5\' 1"  (1.549 m)   Wt 200 lb 12.8 oz (91.1 kg)   LMP 05/25/2018 (Approximate)   BMI 37.94 kg/m   HEENT - WNL. Neck - supple.  Chest - Clear equal BS. Cor - Nl HS. RRR w/o sig m. Abd - Soft. Flat. Nl Bowel sounds. Sl tender RUQ w/o G or RB. Sl tender LLQ>LUQ w/o G or RB.  MS- FROM w/o deformities.  Gait Nl. Neuro -  Nl w/o  focal abnormalities.    Assessment & Plan:   1. Left lower quadrant pain  - Patient given Levsin 0.125 mg  x 2 due to description of a colic/crampy type pain and advised since she describes the severity of pain up to 7/10 , that she should go to ER for further evaluation   (No Charge OV)

## 2018-06-19 NOTE — ED Triage Notes (Signed)
Pt presents with c/o left side abdominal pain with no radiation. Pt reports she has had the pain for the past 6 days. Pt denies any vomiting but does report some nausea and some diarrhea. [facility] with no definitive [facility]. Pt went to her primary today and he was also unable to diagnose patient and sent her here.

## 2018-06-19 NOTE — Discharge Instructions (Signed)
Your lab evaluation today is reassuring as well as her abdominal exam.  Please use Bentyl as needed for abdominal discomfort I would also like for you to begin taking a daily probiotic such as Culturelle or align.  Please follow-up with [facility] for continued evaluation.  Return to the emergency department for fevers, worsening pain, persistent vomiting, blood in the stool or any other new or concerning symptoms.  Your blood sugar was elevated today at 179, please follow-up with your primary care doctor to have this rechecked within the next week.

## 2018-06-27 ENCOUNTER — Other Ambulatory Visit: Payer: Self-pay | Admitting: Internal Medicine

## 2018-07-10 ENCOUNTER — Other Ambulatory Visit (HOSPITAL_COMMUNITY): Payer: Self-pay | Admitting: Psychiatry

## 2018-07-10 DIAGNOSIS — F331 Major depressive disorder, recurrent, moderate: Secondary | ICD-10-CM

## 2018-07-12 ENCOUNTER — Encounter

## 2018-07-12 ENCOUNTER — Ambulatory Visit: Payer: Self-pay | Admitting: Physician Assistant

## 2018-07-16 ENCOUNTER — Ambulatory Visit: Payer: Self-pay | Admitting: Gastroenterology

## 2018-07-17 ENCOUNTER — Ambulatory Visit (HOSPITAL_COMMUNITY): Payer: Self-pay | Admitting: Psychiatry

## 2018-08-07 ENCOUNTER — Telehealth: Payer: Self-pay | Admitting: Nurse Practitioner

## 2018-08-07 NOTE — Telephone Encounter (Signed)
Spoke with patient and advised her she must come in for repeat CPAP compliance and to be set up for sleep study to be evaluated for narcolepsy. She stated she is off [name] and Pristiqu now. She verbalized understanding.

## 2018-08-07 NOTE — Telephone Encounter (Signed)
Pt is asking RN to call her to know if she really needs to keep Oct appointment due to her feeling better.

## 2018-08-09 ENCOUNTER — Other Ambulatory Visit: Payer: BC Managed Care – PPO

## 2018-08-09 ENCOUNTER — Other Ambulatory Visit: Payer: Self-pay

## 2018-08-09 DIAGNOSIS — E039 Hypothyroidism, unspecified: Secondary | ICD-10-CM

## 2018-08-09 DIAGNOSIS — Z79899 Other long term (current) drug therapy: Secondary | ICD-10-CM

## 2018-08-09 LAB — TSH: TSH: 4.71 mIU/L — ABNORMAL HIGH

## 2018-08-23 ENCOUNTER — Other Ambulatory Visit: Payer: Self-pay | Admitting: Physician Assistant

## 2018-09-10 ENCOUNTER — Ambulatory Visit: Payer: BC Managed Care – PPO | Admitting: Nurse Practitioner

## 2018-09-10 NOTE — Progress Notes (Signed)
FOLLOW UP  Assessment and Plan:   Hypertension Well controlled off of medications at this time Monitor blood pressure at home; patient to call if consistently greater than 130/80 Continue DASH diet.   Reminder to go to the ER if any CP, SOB, nausea, dizziness, severe HA, changes vision/speech, left arm numbness and tingling and jaw pain.  Cholesterol Currently at LDL goal; continue pravastatin; diet for trigs discussed, suggested omega 3 supplement Continue low cholesterol diet and exercise.  Check lipid panel.   Prediabetes Continue diet and exercise.  Perform daily foot/skin check, notify office of any concerning changes.  Check A1C  Hypothyroidism continue medications the same pending lab results reminded to take on an empty stomach 30-47mins before food.  check TSH level  Morbid Obesity with co morbidities Long discussion about weight loss, diet, and exercise Recommended diet heavy in fruits and veggies and low in animal meats, cheeses, and dairy products, appropriate calorie intake Discussed ideal weight for height  Will follow up in 3 months  Vitamin D Def Below goal at last visit;  continue supplementation to maintain goal of 70-100 Check Vit D level  Depression/anxiety Continue medications; follow up with psych  Lifestyle discussed: diet/exerise, sleep hygiene, stress management, hydration  Left otitis externa - drops sent in x 10-14 days - follow up if not resolving - ER for severe symptoms  Continue diet and meds as discussed. Further disposition pending results of labs. Discussed med's effects and SE's.   Over 30 minutes of exam, counseling, chart review, and critical decision making was performed.   Future Appointments  Date Time Provider Department Center  09/19/2018  4:00 PM Cheral Marker, [place] [facility]  11/19/2018  8:15 AM Nilda Riggs, NP [facility] None  03/12/2019  3:00 PM Quentin Mulling, PA-C [facility] None     ----------------------------------------------------------------------------------------------------------------------  HPI 37 y.o. female  presents for 3 month follow up on hypertension, cholesterol, prediabetes, morbid obesity, hypothyroidism and vitamin D deficiency.   She has fibromyalgia (reports diagnosed several years ago ~8 years), has seen rheumatology Dr. Corliss Skains remotely and was on many supplements but couldn't afford, and didn't particularly help. She has reported recent flares, under increased stress r/t job.  She has hx of OSA on sleep apnea, had extensive problems with hypersomnia, has depression/anxiety managed by Dr. Evelene Croon. Feeling much better back on abilify.   She reports left ear pressure/pain with sleeping on that side, itchy, feels like draining but only noted clear discharge, feels a bit swollen through tragus.   BMI is Body mass index is 37.6 kg/m., she has been working on diet and exercise. Wt Readings from Last 3 Encounters:  09/11/18 199 lb (90.3 kg)  06/19/18 200 lb 12.8 oz (91.1 kg)  06/04/18 204 lb (92.5 kg)   Her blood pressure has been controlled at home, today their BP is BP: 120/80  She does not workout. She denies chest pain, shortness of breath, dizziness.   She is on cholesterol medication Pravastatin 40 mg daily and denies myalgias. Her LDL cholesterol is at goal; trigs are notably elevated. The cholesterol last visit was:   Lab Results  Component Value Date   CHOL 193 03/08/2018   HDL 44 (L) 03/08/2018   LDLCALC 101 (H) 03/08/2018   LDLDIRECT 114.9 04/30/2012   TRIG 357 (H) 03/08/2018   CHOLHDL 4.4 03/08/2018    She has been working on diet and exercise for prediabetes, and denies increased appetite, nausea, paresthesia of the feet, polydipsia, polyuria and visual  disturbances. Last A1C in the office was:  Lab Results  Component Value Date   HGBA1C 5.9 (H) 03/08/2018   She is on thyroid medication. Her medication was not changed last  visit.  Currently taking 1 tab daily except Tuesday and Thursday not taking.  Lab Results  Component Value Date   TSH 4.71 (H) 08/09/2018   Patient is on Vitamin D supplement.   Lab Results  Component Value Date   VD25OH 53 12/21/2017        Current Medications:  Current Outpatient Medications on File Prior to Visit  Medication Sig  . ALPRAZolam (XANAX) 0.5 MG tablet Take 0.5 mg by mouth as needed.   . ARIPiprazole ([name]) 10 MG tablet Take one tablet by mouth daily (Patient taking differently: 5 mg. Take one tablet by mouth daily)  . Cholecalciferol (VITAMIN D) 2000 UNITS CAPS Take 4,000 Units by mouth daily.   . Ferrous Sulfate (IRON) 325 (65 Fe) MG TABS Take by mouth 2 (two) times daily.  Marland Kitchen ibuprofen (ADVIL,MOTRIN) 800 MG tablet Take 1 tablet by mouth 3 (three) times daily as needed.  Marland Kitchen L-Methylfolate-Algae (DEPLIN 15 PO) Take by mouth.  . metroNIDAZOLE (METROGEL VAGINAL) 0.75 % vaginal gel Place 1 Applicatorful vaginally at bedtime as needed. no alcohol or sex while using  . pravastatin (PRAVACHOL) 40 MG tablet TAKE 1 TABLET BY MOUTH EVERY DAY  . SYNTHROID 150 MCG tablet TAKE 1 TABLET BY MOUTH EVERY DAY BEFORE BREAKFAST  . vitamin E (VITAMIN E) 400 UNIT capsule Take 800 Units by mouth daily.  Marland Kitchen vortioxetine HBr ([name]) 20 MG TABS tablet Take 20 mg by mouth daily.   No current facility-administered medications on file prior to visit.      Allergies:  Allergies  Allergen Reactions  . Lamictal [Lamotrigine] Rash  . Doxycycline Other (See Comments)    GI UPSET  . Effexor [Venlafaxine] Other (See Comments)    DYSPHORIA  . Prednisone Other (See Comments)    FLUSHED  . Savella [Milnacipran Hcl] Other (See Comments)    NO RELIEF     Medical History:  Past Medical History:  Diagnosis Date  . Anemia   . Anxiety   . Constipation   . Depression   . Fibromyalgia   . GERD (gastroesophageal reflux disease)   . Hemorrhoids   . HSV-1 infection   . Hypertension   .  Hypothyroidism   . OSA on CPAP   . Other abnormal glucose   . Preeclampsia    Family history- Reviewed and unchanged Social history- Reviewed and unchanged   Review of Systems:  Review of Systems  Constitutional: Negative for chills, fever, malaise/fatigue and weight loss.  HENT: Positive for ear discharge and ear pain. Negative for hearing loss and tinnitus.   Eyes: Negative for blurred vision and double vision.  Respiratory: Negative for cough, shortness of breath and wheezing.   Cardiovascular: Negative for chest pain, palpitations, orthopnea, claudication and leg swelling.  Gastrointestinal: Negative for abdominal pain, blood in stool, constipation, diarrhea, heartburn, melena, nausea and vomiting.  Genitourinary: Negative.   Musculoskeletal: Negative for joint pain and myalgias.  Skin: Negative for rash.  Neurological: Negative for dizziness, tingling, sensory change, weakness and headaches.  Endo/Heme/Allergies: Negative for polydipsia.  Psychiatric/Behavioral: Positive for depression. Negative for substance abuse and suicidal ideas. The patient is nervous/anxious. The patient does not have insomnia.   All other systems reviewed and are negative.    Physical Exam: BP 120/80   Pulse 81  Temp (!) 97.5 F (36.4 C)   Ht 5\' 1"  (1.549 m)   Wt 199 lb (90.3 kg)   SpO2 98%   BMI 37.60 kg/m  Wt Readings from Last 3 Encounters:  09/11/18 199 lb (90.3 kg)  06/19/18 200 lb 12.8 oz (91.1 kg)  06/04/18 204 lb (92.5 kg)   General Appearance: Well nourished, in no apparent distress. Eyes: PERRLA, EOMs, conjunctiva no swelling or erythema Sinuses: No Frontal/maxillary tenderness ENT/Mouth: R Ext aud canals clear, TM without erythema, bulging. L ext aud canal inflamed/narrowed, mildly erythematous, tragus tenderness, no mastoid bogginess, no discharge. No erythema, swelling, or exudate on post pharynx.  Tonsils not swollen or erythematous. Hearing normal.  Neck: Supple, thyroid  normal.  Respiratory: Respiratory effort normal, BS equal bilaterally without rales, rhonchi, wheezing or stridor.  Cardio: RRR with no MRGs. Brisk peripheral pulses without edema.  Abdomen: Soft, + BS.  Non tender, no guarding, rebound, hernias, masses. Lymphatics: Non tender without lymphadenopathy.  Musculoskeletal: Full ROM, 5/5 strength, Normal gait Skin: Warm, dry without rashes, lesions, ecchymosis.  Neuro: Cranial nerves intact. No cerebellar symptoms.  Psych: Awake and oriented X 3, normal affect, Insight and Judgment appropriate.    Dan Maker, NP 4:33 PM [facility]

## 2018-09-11 ENCOUNTER — Telehealth: Payer: Self-pay | Admitting: *Deleted

## 2018-09-11 ENCOUNTER — Encounter: Payer: Self-pay | Admitting: Adult Health

## 2018-09-11 ENCOUNTER — Ambulatory Visit: Payer: BC Managed Care – PPO | Admitting: Adult Health

## 2018-09-11 VITALS — BP 120/80 | HR 81 | Temp 97.5°F | Ht 61.0 in | Wt 199.0 lb

## 2018-09-11 DIAGNOSIS — K760 Fatty (change of) liver, not elsewhere classified: Secondary | ICD-10-CM

## 2018-09-11 DIAGNOSIS — E559 Vitamin D deficiency, unspecified: Secondary | ICD-10-CM | POA: Diagnosis not present

## 2018-09-11 DIAGNOSIS — Z79899 Other long term (current) drug therapy: Secondary | ICD-10-CM | POA: Diagnosis not present

## 2018-09-11 DIAGNOSIS — I10 Essential (primary) hypertension: Secondary | ICD-10-CM | POA: Diagnosis not present

## 2018-09-11 DIAGNOSIS — E785 Hyperlipidemia, unspecified: Secondary | ICD-10-CM | POA: Diagnosis not present

## 2018-09-11 DIAGNOSIS — H60502 Unspecified acute noninfective otitis externa, left ear: Secondary | ICD-10-CM

## 2018-09-11 DIAGNOSIS — R7303 Prediabetes: Secondary | ICD-10-CM | POA: Diagnosis not present

## 2018-09-11 DIAGNOSIS — E039 Hypothyroidism, unspecified: Secondary | ICD-10-CM

## 2018-09-11 MED ORDER — NEOMYCIN-POLYMYXIN-HC 1 % OT SOLN: 3.0000 [drp] | Freq: Four times a day (QID) | OTIC | 0 refills | Status: DC

## 2018-09-11 NOTE — Telephone Encounter (Signed)
Patient states she has been having 2 days of spotting that will be followed by a couple of days of no bleeding then she will have her period.  Started about 3 months ago.  Is not currently on any BC.  Advised patient to make appt for irregular bleeding.

## 2018-09-11 NOTE — Patient Instructions (Signed)
Do drops 4 times a day for 10-14 days  Monitor for severe pain, fever, headaches vision changes - go to ED if you have these  Consider biotin, zinc supplements or hair/skin/nails supplement    Otitis Externa Otitis externa is an infection of the outer ear canal. The outer ear canal is the area between the outside of the ear and the eardrum. Otitis externa is sometimes called "swimmer's ear." What are the causes? This condition may be caused by:  Swimming in dirty water.  Moisture in the ear.  An injury to the inside of the ear.  An object stuck in the ear.  A cut or scrape on the outside of the ear.  What increases the risk? This condition is more likely to develop in swimmers. What are the signs or symptoms? The first symptom of this condition is often itching in the ear. Later signs and symptoms include:  Swelling of the ear.  Redness in the ear.  Ear pain. The pain may get worse when you pull on your ear.  Pus coming from the ear.  How is this diagnosed? This condition may be diagnosed by examining the ear and testing fluid from the ear for bacteria and funguses. How is this treated? This condition may be treated with:  Antibiotic ear drops. These are often given for 10-14 days.  Medicine to reduce itching and swelling.  Follow these instructions at home:  If you were prescribed antibiotic ear drops, apply them as told by your health care provider. Do not stop using the antibiotic even if your condition improves.  Take over-the-counter and prescription medicines only as told by your health care provider.  Keep all follow-up visits as told by your health care provider. This is important. How is this prevented?  Keep your ear dry. Use the corner of a towel to dry your ear after you swim or bathe.  Avoid scratching or putting things in your ear. Doing these things can damage the ear canal or remove the protective wax that lines it, which makes it easier for  bacteria and funguses to grow.  Avoid swimming in lakes, polluted water, or pools that may not have the right amount of chlorine.  Consider making ear drops and putting 3 or 4 drops in each ear after you swim. Ask your health care provider about how you can make ear drops. Contact a health care provider if:  You have a fever.  After 3 days your ear is still red, swollen, painful, or draining pus.  Your redness, swelling, or pain gets worse.  You have a severe headache.  You have redness, swelling, pain, or tenderness in the area behind your ear. This information is not intended to replace advice given to you by your health care provider. Make sure you discuss any questions you have with your health care provider. Document Released: 11/07/2005 Document Revised: 12/15/2015 Document Reviewed: 08/17/2015 Elsevier Interactive Patient Education  [employer].

## 2018-09-12 ENCOUNTER — Other Ambulatory Visit: Payer: Self-pay | Admitting: Adult Health

## 2018-09-12 LAB — CBC WITH DIFFERENTIAL/PLATELET
BASOS ABS: 26 {cells}/uL (ref 0–200)
Basophils Relative: 0.3 %
EOS ABS: 69 {cells}/uL (ref 15–500)
Eosinophils Relative: 0.8 %
HCT: 42.2 % (ref 35.0–45.0)
HEMOGLOBIN: 14.3 g/dL (ref 11.7–15.5)
Lymphs Abs: 3294 cells/uL (ref 850–3900)
MCH: 29.4 pg (ref 27.0–33.0)
MCHC: 33.9 g/dL (ref 32.0–36.0)
MCV: 86.8 fL (ref 80.0–100.0)
MONOS PCT: 6.9 %
MPV: 11.9 fL (ref 7.5–12.5)
NEUTROS ABS: 4618 {cells}/uL (ref 1500–7800)
Neutrophils Relative %: 53.7 %
Platelets: 182 10*3/uL (ref 140–400)
RBC: 4.86 10*6/uL (ref 3.80–5.10)
RDW: 12.9 % (ref 11.0–15.0)
TOTAL LYMPHOCYTE: 38.3 %
WBC mixed population: 593 cells/uL (ref 200–950)
WBC: 8.6 10*3/uL (ref 3.8–10.8)

## 2018-09-12 LAB — HEMOGLOBIN A1C
HEMOGLOBIN A1C: 6 %{Hb} — AB (ref <5.7)
Mean Plasma Glucose: 126 (calc)
eAG (mmol/L): 7 (calc)

## 2018-09-12 LAB — COMPLETE METABOLIC PANEL WITH GFR
AG RATIO: 1.7 (calc) (ref 1.0–2.5)
ALT: 49 U/L — AB (ref 6–29)
AST: 53 U/L — AB (ref 10–30)
Albumin: 4.8 g/dL (ref 3.6–5.1)
Alkaline phosphatase (APISO): 68 U/L (ref 33–115)
BUN: 12 mg/dL (ref 7–25)
CHLORIDE: 101 mmol/L (ref 98–110)
CO2: 27 mmol/L (ref 20–32)
Calcium: 9.9 mg/dL (ref 8.6–10.2)
Creat: 0.74 mg/dL (ref 0.50–1.10)
GFR, EST NON AFRICAN AMERICAN: 102 mL/min/{1.73_m2} (ref >=60)
GFR, Est African American: 118 mL/min/{1.73_m2} (ref >=60)
GLOBULIN: 2.8 g/dL (ref 1.9–3.7)
Glucose, Bld: 87 mg/dL (ref 65–99)
POTASSIUM: 4.2 mmol/L (ref 3.5–5.3)
Sodium: 138 mmol/L (ref 135–146)
Total Bilirubin: 0.6 mg/dL (ref 0.2–1.2)
Total Protein: 7.6 g/dL (ref 6.1–8.1)

## 2018-09-12 LAB — LIPID PANEL
Cholesterol: 212 mg/dL — ABNORMAL HIGH (ref <200)
HDL: 50 mg/dL — ABNORMAL LOW (ref >50)
LDL CHOLESTEROL (CALC): 122 mg/dL — AB
NON-HDL CHOLESTEROL (CALC): 162 mg/dL — AB (ref <130)
TRIGLYCERIDES: 257 mg/dL — AB (ref <150)
Total CHOL/HDL Ratio: 4.2 (calc) (ref <5.0)

## 2018-09-12 LAB — TSH: TSH: 4.82 m[IU]/L — AB

## 2018-09-12 LAB — MAGNESIUM: MAGNESIUM: 1.9 mg/dL (ref 1.5–2.5)

## 2018-09-12 LAB — VITAMIN D 25 HYDROXY (VIT D DEFICIENCY, FRACTURES): Vit D, 25-Hydroxy: 56 ng/mL (ref 30–100)

## 2018-09-12 MED ORDER — LEVOTHYROXINE SODIUM 150 MCG PO TABS: ORAL_TABLET | ORAL | 1 refills | Status: DC

## 2018-09-12 MED ORDER — VITAMIN D 50 MCG (2000 UT) PO CAPS
6000.0000 [IU] | ORAL_CAPSULE | Freq: Every day | ORAL | Status: DC
Start: 2018-09-12 — End: 2019-03-05

## 2018-09-13 ENCOUNTER — Ambulatory Visit: Payer: Self-pay | Admitting: Physician Assistant

## 2018-09-19 ENCOUNTER — Ambulatory Visit: Payer: BC Managed Care – PPO | Admitting: Women's Health

## 2018-09-19 ENCOUNTER — Encounter: Payer: Self-pay | Admitting: Women's Health

## 2018-09-19 ENCOUNTER — Other Ambulatory Visit: Payer: Self-pay

## 2018-09-19 VITALS — BP 132/92 | HR 77 | Ht 61.0 in | Wt 203.0 lb

## 2018-09-19 DIAGNOSIS — N921 Excessive and frequent menstruation with irregular cycle: Secondary | ICD-10-CM

## 2018-09-19 DIAGNOSIS — N926 Irregular menstruation, unspecified: Secondary | ICD-10-CM | POA: Diagnosis not present

## 2018-09-19 LAB — POCT WET PREP (WET MOUNT)
CLUE CELLS WET PREP WHIFF POC: NEGATIVE
TRICHOMONAS WET PREP HPF POC: ABSENT

## 2018-09-19 NOTE — Progress Notes (Signed)
   GYN VISIT Patient name: Lauren [name] MRN [id number]  Date of birth: 07/09/1981 Chief Complaint:   Menstrual Problem  History of Present Illness:   Lauren [name] is a 37 y.o. G2P2 Caucasian female being seen today for report of irregular bleeding. Periods have been becoming more irregular lately. Started bleeding 10/17 intermittently until 10/24 when bled consistently until 10/28. Periods are heavier for first 1-2d, changes saturated pad q . Some small clots. Some cramping. Denies abnormal discharge, itching/odor/irritation.       Patient's last menstrual period was 09/06/2018. The current method of family planning is vasectomy. Last pap 12/28/17. Results were:  neg w/ -HRHPV Review of Systems:   Pertinent items are noted in HPI Denies fever/chills, dizziness, headaches, visual disturbances, fatigue, shortness of breath, chest pain, abdominal pain, vomiting, abnormal vaginal discharge/itching/odor/irritation, problems with periods, bowel movements, urination, or intercourse unless otherwise stated above.  Pertinent History Reviewed:  Reviewed past medical,surgical, social, obstetrical and family history.  Reviewed problem list, medications and allergies. Physical Assessment:   Vitals:   09/19/18 1627  BP: (!) 132/92  Pulse: 77  Weight: 203 lb (92.1 kg)  Height: 5\' 1"  (1.549 m)  Body mass index is 38.36 kg/m.       Physical Examination:   General appearance: alert, well appearing, and in no distress  Mental status: alert, oriented to person, place, and time  Skin: warm & dry   Cardiovascular: normal heart rate noted  Respiratory: normal respiratory effort, no distress  Abdomen: soft, non-tender   Pelvic: VULVA: normal appearing vulva with no masses, tenderness or lesions, VAGINA: normal appearing vagina with normal color and discharge, no lesions, CERVIX: normal appearing cervix without discharge or lesions, UTERUS: uterus is normal size, shape, consistency and nontender,  ADNEXA: normal adnexa in size, nontender and no masses  Extremities: no edema   Results for orders placed or performed in visit on 09/19/18 (from the past 24 hour(s))  POCT Wet Prep [facility])   Collection Time: 09/19/18  5:01 PM  Result Value Ref Range   Source Wet Prep POC vaginal    WBC, Wet Prep HPF POC few    Bacteria Wet Prep HPF POC Few Few   BACTERIA WET PREP MORPHOLOGY POC     Clue Cells Wet Prep HPF POC None None   Clue Cells Wet Prep Whiff POC Negative Whiff    Yeast Wet Prep HPF POC None None   KOH Wet Prep POC     Trichomonas Wet Prep HPF POC Absent Absent    Assessment & Plan:  1) Irregular heavy periods> will send gc/ct, wet prep neg, will get pelvic u/s  Meds: No orders of the defined types were placed in this encounter.   Orders Placed This Encounter  Procedures  . GC/Chlamydia Probe Amp  . US PELVIS (TRANSABDOMINAL ONLY)  . US PELVIS TRANSVANGINAL NON-OB (TV ONLY)  . POCT Wet Prep [facility])    Return for asap for pelvic u/s and f/u.  Cheral Marker CNM, [facility] 09/19/2018 5:03 PM

## 2018-09-20 LAB — GC/CHLAMYDIA PROBE AMP
Chlamydia trachomatis, NAA: NEGATIVE
Neisseria gonorrhoeae by PCR: NEGATIVE

## 2018-09-26 ENCOUNTER — Ambulatory Visit: Payer: BC Managed Care – PPO | Admitting: Adult Health

## 2018-09-26 ENCOUNTER — Encounter: Payer: Self-pay | Admitting: Adult Health

## 2018-09-26 VITALS — BP 122/84 | HR 71 | Temp 97.5°F | Ht 61.0 in | Wt 202.8 lb

## 2018-09-26 DIAGNOSIS — B349 Viral infection, unspecified: Secondary | ICD-10-CM

## 2018-09-26 DIAGNOSIS — R42 Dizziness and giddiness: Secondary | ICD-10-CM

## 2018-09-26 MED ORDER — DEXAMETHASONE 0.5 MG PO TABS: ORAL_TABLET | ORAL | 0 refills | Status: DC

## 2018-09-26 NOTE — Progress Notes (Signed)
Assessment and Plan:  Lauren [name] was seen today for dizziness, headache, cough and generalized body aches.  Diagnoses and all orders for this visit:  Dizziness, nonspecific/ Nonspecific syndrome suggestive of viral illness Mild/vague symptoms, benign exam, ? Viral illness, no red flags, likely component of dehydration, though orthostatics reviewed and unremarkable other than mild htn After discussion with patient will check basic labs, rule out anemia, electrolyte imbalances, acute renal/liver function changes,  Will try some oral steroids, emphasized hydration (65+ fluid ounces daily) Will go to the ER if worsening headache, changes vision/speech, imbalance, weakness, neck stiffness/pain or other sudden or severe symptoms She will call to report any new specific symptoms, may consider lymes etc if not improving -     Orthostatic vital signs -     CBC with Differential/Platelet -     COMPLETE METABOLIC PANEL WITH GFR -     Urinalysis w microscopic + reflex cultur -     dexamethasone (DECADRON) 0.5 MG tablet; Take 1 tablet PO BID for 5 days and then 1 tablet PO QDaily for 5 days.  Further disposition pending results of labs. Discussed med's effects and SE's.   Over 15 minutes of exam, counseling, chart review, and critical decision making was performed.   Future Appointments  Date Time Provider Department Center  09/27/2018  2:00 PM [facility] [place] [facility] None  09/27/2018  2:45 PM Cheral Marker, CNM [facility]  11/19/2018  8:15 AM Nilda Riggs, NP [facility] None  03/12/2019  3:00 PM Quentin Mulling, PA-C [facility] None    ------------------------------------------------------------------------------------------------------------------   HPI BP 122/84   Pulse 71   Temp (!) 97.5 F (36.4 C)   Ht 5\' 1"  (1.549 m)   Wt 202 lb 12.8 oz (92 kg)   LMP 09/06/2018   SpO2 99%   BMI 38.32 kg/m   37 y.o.female presents for evaluation of nausea, feeling light headed, dizzy  since yesterday, Had mild headache but took a tylenol and this helped. She endorses mild cough, some sniffles, generalized body aches that feel different than usual. She has a sense of fever/chills, but temp checks at home have been normal. Denies sore throat. She did have some mild abdominal cramping yesterday but improved today. She had mild diarrhea yesterday. Resolved today. Denies urinary changes, rashes, abdominal pain, dyspnea, productive cough, chest pains, palpitations.   She denies recent travel, new medications, no family members with similar symptoms. She is a Runner, broadcasting/film/video, special ED. She had influenza vaccine 2 weeks ago. She has no known hx of exposure to ticks, doesn't spend much time outdoors.   She has not taken anything other than tylenol. Denies hx of allergies. She admits to poor fluid intake, estimates 2 bottles daily.   She has had tonsils out and cholecystectomy.   Past Medical History:  Diagnosis Date  . Anemia   . Anxiety   . Constipation   . Depression   . Fibromyalgia   . GERD (gastroesophageal reflux disease)   . Hemorrhoids   . HSV-1 infection   . Hypertension   . Hypothyroidism   . OSA on CPAP   . Other abnormal glucose   . Preeclampsia      Allergies  Allergen Reactions  . Lamictal [Lamotrigine] Rash  . Doxycycline Other (See Comments)    GI UPSET  . Effexor [Venlafaxine] Other (See Comments)    DYSPHORIA  . Prednisone Other (See Comments)    FLUSHED  . Savella [Milnacipran Hcl] Other (See Comments)  NO RELIEF    Current Outpatient Medications on File Prior to Visit  Medication Sig  . ALPRAZolam (XANAX) 0.5 MG tablet Take 0.5 mg by mouth as needed.   . ARIPiprazole ([name]) 10 MG tablet Take one tablet by mouth daily (Patient taking differently: 5 mg. Take one tablet by mouth daily)  . Cholecalciferol (VITAMIN D) 2000 units CAPS Take 3 capsules (6,000 Units total) by mouth daily.  . Ferrous Sulfate (IRON) 325 (65 Fe) MG TABS Take by mouth 2  (two) times daily.  Marland Kitchen ibuprofen (ADVIL,MOTRIN) 800 MG tablet Take 1 tablet by mouth 3 (three) times daily as needed.  Marland Kitchen L-Methylfolate-Algae (DEPLIN 15 PO) Take by mouth.  . levothyroxine (SYNTHROID) 150 MCG tablet Take 1 tab daily except take 1/2 tab on Tuesday and Thursday; take on empty stomach.  . metroNIDAZOLE (METROGEL VAGINAL) 0.75 % vaginal gel Place 1 Applicatorful vaginally at bedtime as needed. no alcohol or sex while using  . pravastatin (PRAVACHOL) 40 MG tablet TAKE 1 TABLET BY MOUTH EVERY DAY  . vitamin E (VITAMIN E) 400 UNIT capsule Take 800 Units by mouth daily.  Marland Kitchen vortioxetine HBr ([name]) 20 MG TABS tablet Take 20 mg by mouth daily.   No current facility-administered medications on file prior to visit.     ROS: Review of Systems  Constitutional: Positive for malaise/fatigue. Negative for chills, diaphoresis, fever and weight loss.  HENT: Negative for congestion, ear pain, sore throat and tinnitus.   Eyes: Negative for blurred vision, double vision, photophobia, pain and redness.  Respiratory: Positive for cough (mild). Negative for hemoptysis, sputum production, shortness of breath and wheezing.   Cardiovascular: Negative for chest pain, palpitations, orthopnea, claudication, leg swelling and PND.  Gastrointestinal: Positive for diarrhea (1 episode yesterday) and nausea. Negative for abdominal pain, blood in stool, constipation, heartburn, melena and vomiting.  Genitourinary: Negative for dysuria, flank pain, frequency, hematuria and urgency.  Musculoskeletal: Positive for myalgias. Negative for back pain, falls, joint pain and neck pain.  Skin: Negative for rash.  Neurological: Positive for dizziness and headaches (mild, frontal, improved with tylenol).  Endo/Heme/Allergies: Negative for environmental allergies and polydipsia.    Physical Exam:  BP 122/84   Pulse 71   Temp (!) 97.5 F (36.4 C)   Ht 5\' 1"  (1.549 m)   Wt 202 lb 12.8 oz (92 kg)   LMP 09/06/2018    SpO2 99%   BMI 38.32 kg/m   General Appearance: Well nourished, in no apparent distress. Eyes: PERRLA, EOMs, conjunctiva no swelling or erythema Sinuses: No Frontal/maxillary tenderness ENT/Mouth: Ext aud canals clear, TMs without erythema, bulging. No erythema, swelling, or exudate on post pharynx.  Tonsils not swollen or erythematous. Hearing normal.  Neck: Supple, thyroid normal.  Respiratory: Respiratory effort normal, BS equal bilaterally without rales, rhonchi, wheezing or stridor.  Cardio: RRR with no MRGs. Brisk peripheral pulses without edema.  Abdomen: Soft, + BS.  Non tender, no guarding, rebound, hernias, masses. Lymphatics: Non tender without lymphadenopathy.  Musculoskeletal: Full ROM, 5/5 strength, normal gait.  Skin: Warm, dry without rashes, lesions, ecchymosis.  Neuro: Cranial nerves intact. Normal muscle tone, no cerebellar symptoms. Sensation intact.  Psych: Awake and oriented X 3, normal affect, Insight and Judgment appropriate.   Dan Maker, NP 4:48 PM [facility]

## 2018-09-26 NOTE — Patient Instructions (Signed)
Please keep an eye on your blood pressure,   Drink plenty of fluids, try black currant syrup or gummies supplement (can help with viral illness)  Your symptoms are non-specific - could be viral illness, some dehydration  Please let me know if you have any new symptoms  Please go to ED for any sudden or severe symptoms tonight    Dizziness Dizziness is a common problem. It is a feeling of unsteadiness or light-headedness. You may feel like you are about to faint. Dizziness can lead to injury if you stumble or fall. Anyone can become dizzy, but dizziness is more common in older adults. This condition can be caused by a number of things, including medicines, dehydration, or illness. Follow these instructions at home: Eating and drinking  Drink enough fluid to keep your urine clear or pale yellow. This helps to keep you from becoming dehydrated. Try to drink more clear fluids, such as water.  Do not drink alcohol.  Limit your caffeine intake if told to do so by your health care provider. Check ingredients and nutrition facts to see if a food or beverage contains caffeine.  Limit your salt (sodium) intake if told to do so by your health care provider. Check ingredients and nutrition facts to see if a food or beverage contains sodium. Activity  Avoid making quick movements. ? Rise slowly from chairs and steady yourself until you feel okay. ? In the morning, first sit up on the side of the bed. When you feel okay, stand slowly while you hold onto something until you know that your balance is fine.  If you need to stand in one place for a long time, move your legs often. Tighten and relax the muscles in your legs while you are standing.  Do not drive or use heavy machinery if you feel dizzy.  Avoid bending down if you feel dizzy. Place items in your home so that they are easy for you to reach without leaning over. Lifestyle  Do not use any products that contain nicotine or tobacco, such  as cigarettes and e-cigarettes. If you need help quitting, ask your health care provider.  Try to reduce your stress level by using methods such as yoga or meditation. Talk with your health care provider if you need help to manage your stress. General instructions  Watch your dizziness for any changes.  Take over-the-counter and prescription medicines only as told by your health care provider. Talk with your health care provider if you think that your dizziness is caused by a medicine that you are taking.  Tell a friend or a family member that you are feeling dizzy. If he or she notices any changes in your behavior, have this person call your health care provider.  Keep all follow-up visits as told by your health care provider. This is important. Contact a health care provider if:  Your dizziness does not go away.  Your dizziness or light-headedness gets worse.  You feel nauseous.  You have reduced hearing.  You have new symptoms.  You are unsteady on your feet or you feel like the room is spinning. Get help right away if:  You vomit or have diarrhea and are unable to eat or drink anything.  You have problems talking, walking, swallowing, or using your arms, hands, or legs.  You feel generally weak.  You are not thinking clearly or you have trouble forming sentences. It may take a friend or family member to notice this.  You  have chest pain, abdominal pain, shortness of breath, or sweating.  Your vision changes.  You have any bleeding.  You have a severe headache.  You have neck pain or a stiff neck.  You have a fever. These symptoms may represent a serious problem that is an emergency. Do not wait to see if the symptoms will go away. Get medical help right away. Call your local emergency services (911 in the [place] Do not drive yourself to the hospital. Summary  Dizziness is a feeling of unsteadiness or light-headedness. This condition can be caused by a number of  things, including medicines, dehydration, or illness.  Anyone can become dizzy, but dizziness is more common in older adults.  Drink enough fluid to keep your urine clear or pale yellow. Do not drink alcohol.  Avoid making quick movements if you feel dizzy. Monitor your dizziness for any changes. This information is not intended to replace advice given to you by your health care provider. Make sure you discuss any questions you have with your health care provider. Document Released: 05/03/2001 Document Revised: 12/10/2016 Document Reviewed: 12/10/2016 Elsevier Interactive Patient Education  [employer].

## 2018-09-27 ENCOUNTER — Ambulatory Visit (INDEPENDENT_AMBULATORY_CARE_PROVIDER_SITE_OTHER): Payer: BC Managed Care – PPO

## 2018-09-27 ENCOUNTER — Other Ambulatory Visit: Payer: Self-pay | Admitting: Adult Health

## 2018-09-27 ENCOUNTER — Encounter: Payer: Self-pay | Admitting: Women's Health

## 2018-09-27 ENCOUNTER — Ambulatory Visit: Payer: BC Managed Care – PPO | Admitting: Women's Health

## 2018-09-27 VITALS — BP 114/79 | HR 92 | Ht 61.0 in | Wt 202.0 lb

## 2018-09-27 DIAGNOSIS — N921 Excessive and frequent menstruation with irregular cycle: Secondary | ICD-10-CM

## 2018-09-27 DIAGNOSIS — N926 Irregular menstruation, unspecified: Secondary | ICD-10-CM

## 2018-09-27 LAB — URINALYSIS W MICROSCOPIC + REFLEX CULTURE
BILIRUBIN URINE: NEGATIVE
Bacteria, UA: NONE SEEN /HPF
Glucose, UA: NEGATIVE
HGB URINE DIPSTICK: NEGATIVE
Hyaline Cast: NONE SEEN /LPF
Ketones, ur: NEGATIVE
Leukocyte Esterase: NEGATIVE
NITRITES URINE, INITIAL: NEGATIVE
PROTEIN: NEGATIVE
RBC / HPF: NONE SEEN /HPF (ref 0–2)
Specific Gravity, Urine: 1.016 (ref 1.001–1.03)
Squamous Epithelial / LPF: NONE SEEN /HPF (ref <=5)
WBC UA: NONE SEEN /HPF (ref 0–5)
pH: 6.5 (ref 5.0–8.0)

## 2018-09-27 LAB — CBC WITH DIFFERENTIAL/PLATELET
Basophils Absolute: 28 cells/uL (ref 0–200)
Basophils Relative: 0.3 %
EOS PCT: 1 %
Eosinophils Absolute: 92 cells/uL (ref 15–500)
HEMATOCRIT: 39.9 % (ref 35.0–45.0)
Hemoglobin: 13.6 g/dL (ref 11.7–15.5)
LYMPHS ABS: 3275 {cells}/uL (ref 850–3900)
MCH: 29.6 pg (ref 27.0–33.0)
MCHC: 34.1 g/dL (ref 32.0–36.0)
MCV: 86.7 fL (ref 80.0–100.0)
MPV: 11.8 fL (ref 7.5–12.5)
Monocytes Relative: 5.9 %
NEUTROS PCT: 57.2 %
Neutro Abs: 5262 cells/uL (ref 1500–7800)
PLATELETS: 169 10*3/uL (ref 140–400)
RBC: 4.6 10*6/uL (ref 3.80–5.10)
RDW: 12.6 % (ref 11.0–15.0)
TOTAL LYMPHOCYTE: 35.6 %
WBC mixed population: 543 cells/uL (ref 200–950)
WBC: 9.2 10*3/uL (ref 3.8–10.8)

## 2018-09-27 LAB — NO CULTURE INDICATED

## 2018-09-27 LAB — COMPLETE METABOLIC PANEL WITH GFR
AG Ratio: 1.9 (calc) (ref 1.0–2.5)
ALBUMIN MSPROF: 4.7 g/dL (ref 3.6–5.1)
ALKALINE PHOSPHATASE (APISO): 66 U/L (ref 33–115)
ALT: 49 U/L — ABNORMAL HIGH (ref 6–29)
AST: 57 U/L — AB (ref 10–30)
BUN: 11 mg/dL (ref 7–25)
CO2: 31 mmol/L (ref 20–32)
CREATININE: 0.82 mg/dL (ref 0.50–1.10)
Calcium: 10.7 mg/dL — ABNORMAL HIGH (ref 8.6–10.2)
Chloride: 99 mmol/L (ref 98–110)
GFR, Est African American: 104 mL/min/{1.73_m2} (ref >=60)
GFR, Est Non African American: 90 mL/min/{1.73_m2} (ref >=60)
GLUCOSE: 90 mg/dL (ref 65–99)
Globulin: 2.5 g/dL (calc) (ref 1.9–3.7)
Potassium: 3.7 mmol/L (ref 3.5–5.3)
Sodium: 138 mmol/L (ref 135–146)
TOTAL PROTEIN: 7.2 g/dL (ref 6.1–8.1)
Total Bilirubin: 0.6 mg/dL (ref 0.2–1.2)

## 2018-09-27 NOTE — Progress Notes (Signed)
   GYN VISIT Patient name: TURQUOISE ESCH MRN [id number]  Date of birth: 12-11-1980 Chief Complaint:   Follow-up (pelvic u/s)  History of Present Illness:   CAILA CIRELLI is a 37 y.o. G2P2 Caucasian female being seen today for f/u after pelvic u/s done today for report of irregular heavier periods. Bled intermittently from 10/17 to 10/24 then consistently until 10/28, changing saturated pad q at the heaviest times, small clots, come cramping.      Patient's last menstrual period was 09/06/2018. The current method of family planning is vasectomy. Last pap 12/28/17. Results were:  neg w/ -HRHPV Review of Systems:   Pertinent items are noted in HPI Denies fever/chills, dizziness, headaches, visual disturbances, fatigue, shortness of breath, chest pain, abdominal pain, vomiting, abnormal vaginal discharge/itching/odor/irritation, problems with periods, bowel movements, urination, or intercourse unless otherwise stated above.  Pertinent History Reviewed:  Reviewed past medical,surgical, social, obstetrical and family history.  Reviewed problem list, medications and allergies. Physical Assessment:   Vitals:   09/27/18 1437  BP: 114/79  Pulse: 92  Weight: 202 lb (91.6 kg)  Height: 5\' 1"  (1.549 m)  Body mass index is 38.17 kg/m.       Physical Examination:   General appearance: alert, well appearing, and in no distress  Mental status: alert, oriented to person, place, and time  Skin: warm & dry   Cardiovascular: normal heart rate noted  Respiratory: normal respiratory effort, no distress  Abdomen: soft, non-tender   Pelvic: examination not indicated  Extremities: no edema   Selenne Coggin Barlowe is a 37 y.o. G2P2 LMP 09/17/2018,she is here for a pelvic sonogram for menorrhagia.  Uterus                      8.2 x 4.9 x 6.2 cm, vol 130 ml, homogeneous anteverted uterus,wnl  Endometrium          13 mm, symmetrical, wnl  Right ovary             4.6 x 3.5 x 4.9 cm, wnl  Left  ovary                3.4 x 2 x 2.3 cm, wnl  No free fluid   Technician Comments:  PELVIC US TA/TV: homogeneous anteverted uterus,wnl,mult.simple nabothian cysts,EEC 13 mm,simple right ovarian cyst 3.4 x 2.8 x 3 cm,normal left ovary,no free fluid,ovaries appear mobile,right adnexal discomfort during ultrasound  [employer] 09/27/2018 2:45 PM   Assessment & Plan:  1) Irregular and heavier periods> wet prep, gc/ct neg 10/30, today's u/s reveals simple Rt ovarian cyst 3.4x2.8x3cm, otherwise normal. Offered medical management, declines, states she just wanted to make sure everything was ok. To let us know if changes mind, or if starts to have RLQ pain from cyst- would recheck  Meds: No orders of the defined types were placed in this encounter.   No orders of the defined types were placed in this encounter.   Return for after 2/7 for physical.  Cheral Marker CNM, [facility] 09/27/2018 2:58 PM

## 2018-09-27 NOTE — Progress Notes (Signed)
PELVIC US TA/TV: homogeneous anteverted uterus,wnl,mult.simple nabothian cysts,EEC 13 mm,simple right ovarian cyst 3.4 x 2.8 x 3 cm,normal left ovary,no free fluid,ovaries appear mobile,right adnexal discomfort during ultrasound

## 2018-10-04 DIAGNOSIS — K769 Liver disease, unspecified: Secondary | ICD-10-CM | POA: Insufficient documentation

## 2018-10-24 ENCOUNTER — Other Ambulatory Visit: Payer: BC Managed Care – PPO

## 2018-10-24 DIAGNOSIS — E039 Hypothyroidism, unspecified: Secondary | ICD-10-CM

## 2018-10-25 LAB — PTH, INTACT AND CALCIUM
CALCIUM: 9.5 mg/dL (ref 8.6–10.2)
PTH: 34 pg/mL (ref 14–64)

## 2018-11-19 ENCOUNTER — Ambulatory Visit: Payer: BC Managed Care – PPO | Admitting: Nurse Practitioner

## 2018-12-13 ENCOUNTER — Encounter: Payer: Self-pay | Admitting: Internal Medicine

## 2018-12-31 ENCOUNTER — Ambulatory Visit: Payer: BC Managed Care – PPO | Admitting: Women's Health

## 2018-12-31 ENCOUNTER — Encounter: Payer: Self-pay | Admitting: Women's Health

## 2018-12-31 VITALS — BP 125/82 | HR 70 | Ht 60.75 in | Wt 198.5 lb

## 2018-12-31 DIAGNOSIS — K602 Anal fissure, unspecified: Secondary | ICD-10-CM

## 2018-12-31 DIAGNOSIS — Z01419 Encounter for gynecological examination (general) (routine) without abnormal findings: Secondary | ICD-10-CM | POA: Diagnosis not present

## 2018-12-31 MED ORDER — LIDOCAINE HCL URETHRAL/MUCOSAL 2 % EX GEL: 1.0000 "application " | CUTANEOUS | 2 refills | Status: DC | PRN

## 2018-12-31 NOTE — Progress Notes (Signed)
   WELL-WOMAN EXAMINATION Patient name: Lauren [name] MRN [id number]  Date of birth: 12/11/1980 Chief Complaint:   Gynecologic Exam  History of Present Illness:   Lauren [name] is a 38 y.o. G2P2 Caucasian female being seen today for a routine well-woman exam.  Current complaints: feels like anal fissure is back for last month, had TCS 1/1 which was normal, had bright red blood and pain w/ bm last week, bleeding improved, pain still present. Takes miralax occ, needs to take more often.  Reports periods are better since last time she saw us, not as heavy, more regular  PCP: [facility]      does not desire labs, done by PCP Patient's last menstrual period was 12/27/2018 (approximate). Last pap 12/28/17. Results were: neg w/ -HRHPV Last mammogram: never, just turned 38yo. Results were: n/a. Family h/o breast cancer: No Last colonoscopy: Jan for h/o polyps. Results were: normal. Family h/o colorectal cancer: No Review of Systems:   Pertinent items are noted in HPI Denies any headaches, blurred vision, fatigue, shortness of breath, chest pain, abdominal pain, abnormal vaginal discharge/itching/odor/irritation, problems with periods, bowel movements, urination, or intercourse unless otherwise stated above. Pertinent History Reviewed:  Reviewed past medical,surgical, social and family history.  Reviewed problem list, medications and allergies. Physical Assessment:   Vitals:   12/31/18 1621  BP: 125/82  Pulse: 70  Weight: 198 lb 8 oz (90 kg)  Height: 5' 0.75" (1.543 m)  Body mass index is 37.82 kg/m.        Physical Examination:   General appearance - well appearing, and in no distress  Mental status - alert, oriented to person, place, and time  Psych:  She has a normal mood and affect  Skin - warm and dry, normal color, no suspicious lesions noted  Chest - effort normal, all lung fields clear to auscultation bilaterally  Heart - normal rate and regular rhythm  Neck:  midline trachea,  no thyromegaly or nodules  Breasts - breasts appear normal, no suspicious masses, no skin or nipple changes or  axillary nodes  Abdomen - soft, nontender, nondistended, no masses or organomegaly  Pelvic - VULVA: normal appearing vulva with no masses, tenderness or lesions  VAGINA: normal appearing vagina with normal color and discharge, no lesions  CERVIX: normal appearing cervix without discharge or lesions, no CMT  Thin prep pap is not done  UTERUS: uterus is felt to be normal size, shape, consistency and nontender   ADNEXA: No adnexal masses or tenderness noted.  Rectal - external non-thrombosed hemorrhoid; anal fissure anterior wall, normal rectal, good sphincter tone, no masses felt  Extremities:  No swelling or varicosities noted  No results found for this or any previous visit (from the past 24 hour(s)).  Assessment & Plan:  1) Well-Woman Exam  2) Anal fissure> gave printed info on constipation prevention/relief measures, rx lidocaine jelly, if not improving contact GI  Labs/procedures today: none  Mammogram-call to schedule screening mammo now  Colonoscopy per GI- or sooner if problems  No orders of the defined types were placed in this encounter.   Follow-up: Return in about 1 year (around 01/01/2020) for Physical.  Cheral MarkerKimberly R Sibley Rolison CNM, WHNP-BC 12/31/2018 5:05 PM

## 2018-12-31 NOTE — Patient Instructions (Addendum)
Call to schedule mammogram   Constipation  Drink plenty of fluid, preferably water, throughout the day  Eat foods high in fiber such as fruits, vegetables, and grains  Exercise, such as walking, is a good way to keep your bowels regular  Drink warm fluids, especially warm prune juice, or decaf coffee  Eat a 1/2 cup of real oatmeal (not instant), 1/2 cup applesauce, and 1/2-1 cup warm prune juice every day  If needed, you may take Colace (docusate sodium) stool softener once or twice a day to help keep the stool soft. If you are pregnant, wait until you are out of your first trimester (12-14 weeks of pregnancy)  If you still are having problems with constipation, you may take Miralax once daily as needed to help keep your bowels regular.  If you are pregnant, wait until you are out of your first trimester (12-14 weeks of pregnancy)

## 2019-01-30 ENCOUNTER — Other Ambulatory Visit: Payer: Self-pay | Admitting: Physician Assistant

## 2019-01-30 DIAGNOSIS — Z1231 Encounter for screening mammogram for malignant neoplasm of breast: Secondary | ICD-10-CM

## 2019-01-31 ENCOUNTER — Other Ambulatory Visit: Payer: Self-pay | Admitting: Internal Medicine

## 2019-03-04 ENCOUNTER — Ambulatory Visit: Payer: Self-pay

## 2019-03-05 ENCOUNTER — Ambulatory Visit: Payer: BC Managed Care – PPO | Admitting: Adult Health

## 2019-03-05 ENCOUNTER — Encounter: Payer: Self-pay | Admitting: Family Medicine

## 2019-03-05 ENCOUNTER — Telehealth: Payer: Self-pay | Admitting: Women's Health

## 2019-03-05 ENCOUNTER — Other Ambulatory Visit: Payer: Self-pay

## 2019-03-05 ENCOUNTER — Encounter: Payer: Self-pay | Admitting: Adult Health

## 2019-03-05 ENCOUNTER — Telehealth: Payer: Self-pay | Admitting: Family Medicine

## 2019-03-05 VITALS — BP 111/74 | HR 74 | Temp 98.8°F | Ht 61.0 in | Wt 201.8 lb

## 2019-03-05 DIAGNOSIS — N760 Acute vaginitis: Secondary | ICD-10-CM

## 2019-03-05 DIAGNOSIS — B9689 Other specified bacterial agents as the cause of diseases classified elsewhere: Secondary | ICD-10-CM | POA: Diagnosis not present

## 2019-03-05 DIAGNOSIS — N898 Other specified noninflammatory disorders of vagina: Secondary | ICD-10-CM | POA: Diagnosis not present

## 2019-03-05 DIAGNOSIS — R109 Unspecified abdominal pain: Secondary | ICD-10-CM | POA: Diagnosis not present

## 2019-03-05 LAB — POCT WET PREP (WET MOUNT)
Clue Cells Wet Prep Whiff POC: NEGATIVE
WBC, Wet Prep HPF POC: POSITIVE

## 2019-03-05 MED ORDER — METRONIDAZOLE 500 MG PO TABS: 500.0000 mg | ORAL_TABLET | Freq: Two times a day (BID) | ORAL | 0 refills | Status: DC

## 2019-03-05 NOTE — Telephone Encounter (Signed)
Spoke with pt. Pt has been having some vaginal discharge. Has been going on x 2 weeks. Reddish/brownish in color. Has some odor. Stomach cramps. No other symptoms. Advised she needs an appt. Call transferred to front desk. JSY

## 2019-03-05 NOTE — Telephone Encounter (Signed)
I called and spoke with the patient regarding changing their apt to a VV. I explained to the patient that we would file their insurance and they gave consent. I also walked through the steps of downloading the app, and starting the virtual meeting, after confirming the patient had access to a smart phone with a working camera and microphone access. I confirmed email address with patient.

## 2019-03-05 NOTE — Telephone Encounter (Signed)
Updated chart for VV appt.

## 2019-03-05 NOTE — Progress Notes (Signed)
  Subjective:     Patient ID: Lauren [name], female   DOB: February 28, 1981, 38 y.o.   MRN: [id number]  HPI Lauren [name] is a 37 year old white female,married, G2P2, in complaining of vaginal discharge, has been reddish brown to white, with slight odor and some itching and has had some stomach cramps and diarrhea. She is a Runner, broadcasting/film/video.  PCP is Dr Delila Spence.   Review of Systems +vaginal discharge +slight odor +itching at times +stomach cramps  +some diarrhea  Reviewed past medical,surgical, social and family history. Reviewed medications and allergies.     Objective:   Physical Exam BP 111/74 (BP Location: Right Arm, Patient Position: Sitting, Cuff Size: Large)   Pulse 74   Temp 98.8 F (37.1 C)   Ht 5\' 1"  (1.549 m)   Wt 201 lb 12.8 oz (91.5 kg)   LMP 02/22/2019   BMI 38.13 kg/m   Skin warm and dry.Pelvic: external genitalia is normal in appearance no lesions, vagina:white mucousy discharge without odor,urethra has no lesions or masses noted, cervix:smooth and bulbous, uterus: normal size, shape and contour, non tender, no masses felt, adnexa: no masses, mild  tenderness noted, over uterus and LLQ, over bowel.. Bladder is non tender and no masses felt. Wet prep: + for few clue cells and +WBCs. Nuswab obtained. Examination chaperoned by Malachy Mood LPN. Fall risk is low. PHQ 9 score is 6, she is on meds and denies suicidal ideations.  Discussed that has few clue cells will treat for BV, but may be ovulating too.     Assessment:     1. BV (bacterial vaginosis) - NuSwab Vaginitis Plus (VG+) - POCT Wet Prep [facility]) Meds ordered this encounter  Medications  . metroNIDAZOLE (FLAGYL) 500 MG tablet    Sig: Take 1 tablet (500 mg total) by mouth 2 (two) times daily.    Dispense:  14 tablet    Refill:  0    Order Specific Question:   Supervising Provider    Answer:   Duane Lope H [[id number]]  No sex or alcohol while taking meds   2. Vaginal discharge - NuSwab Vaginitis Plus (VG+) - POCT Wet Prep  (Wet [place])  3. Stomach cramps -if cramps persists call me back can get [place]    Plan:     Nuswab sent Meds ordered this encounter  Medications  . metroNIDAZOLE (FLAGYL) 500 MG tablet    Sig: Take 1 tablet (500 mg total) by mouth 2 (two) times daily.    Dispense:  14 tablet    Refill:  0    Order Specific Question:   Supervising Provider    Answer:   Duane Lope H [[id number]]  F/U prn

## 2019-03-05 NOTE — Telephone Encounter (Signed)
Has a few questions about an issue and needs to speak to a nurse to see if she needs appointment

## 2019-03-06 ENCOUNTER — Encounter: Payer: Self-pay | Admitting: Family Medicine

## 2019-03-06 ENCOUNTER — Ambulatory Visit (INDEPENDENT_AMBULATORY_CARE_PROVIDER_SITE_OTHER): Payer: BC Managed Care – PPO | Admitting: Family Medicine

## 2019-03-06 DIAGNOSIS — G4733 Obstructive sleep apnea (adult) (pediatric): Secondary | ICD-10-CM

## 2019-03-06 DIAGNOSIS — Z9989 Dependence on other enabling machines and devices: Secondary | ICD-10-CM | POA: Diagnosis not present

## 2019-03-06 NOTE — Progress Notes (Signed)
PATIENT: Lauren [name] DOB: 07/07/1981  REASON FOR VISIT: follow up HISTORY FROM: patient  Virtual Visit via Telephone Note  I connected with Lauren [name] on 03/06/19 at  2:00 PM EDT by telephone and verified that I am speaking with the correct person using two identifiers.   I discussed the limitations, risks, security and privacy concerns of performing an evaluation and management service by telephone and the availability of in person appointments. I also discussed with the patient that there may be a patient responsible charge related to this service. The patient expressed understanding and agreed to proceed.   History of Present Illness:  03/06/19 Lauren [name] is a 38 y.o. female for follow up of OSA on CPAP.  She was last seen by Eber Jonesarolyn in July 2019.  At that time she was having concerns of noncompliance.  Since she reports that she has been more compliant with therapy.  She tries to use the machine every day.  She does admit that occasionally she wakes up at night and takes the mask off.  She is doing better and better each month.  She denies any concerns at this time and reports that she is sleeping much better. Download reveals the following:  02/04/2019 - 03/05/2019 Usage 02/04/2019 - 03/05/2019 Usage days 26/30 days (87%) >= 4 hours 19 days (63%) < 4 hours 7 days (23%) Usage hours 148 hours 6 minutes Average usage (total days) 4 hours 56 minutes Average usage (days used) 5 hours 42 minutes Median usage (days used) 5 hours 49 minutes Total used hours (value since last reset - 03/05/2019) 2,006 hours AirSense 10 AutoSet For Her Serial number [id number] Mode AutoSet Min Pressure 5 cmH2O Max Pressure 12 cmH2O EPR Fulltime EPR level 3 Response Standard Therapy Pressure - cmH2O Median: 6.4 95th percentile: 8.7 Maximum: 9.7 Leaks - L/min Median: 0.0 95th percentile: 2.8 Maximum: 7.7 Events per hour AI: 0.2 HI: 0.1 AHI: 0.3 Apnea Index Central: 0.0  Obstructive: 0.2 Unknown: 0.0 RERA Index 0.0 Cheyne-Stokes respiration (average duration per night) 0 minutes (0%)   History (copied from [place]'s note on 05/23/2018)  UPDATE 7/3/2019CM Ms.Lauren [name] 38 year old female returns for follow-up with history of obstructive apnea.  Patient also has a history of sleep paralysis but no reasons.  She was unable to complete the MSLT needed for diagnosis of narcolepsy.  She must first be compliant with CPAP.  CPAP data dated 04/22/2018-05/21/2018 shows compliance greater than 4 hours at 63%.  Average usage 6 hours 19 minutes.  Set pressure 5 to 12 cm.  EPR level 3.  Leak 95th percentile 8.1.  AHI 3.1 ESS 11.  She returns for reevaluation Lauren BaasMandy E Swoffordis a 38 y.o.female, seen here as in a referral from Dr. Melanee LeftMcKeownfor a sleep evaluation. I had the pleasure of meeting with [facility] [name] before about 6-1/2 years ago she had undergone a sleep study at [facility]. She has been followed by Dr. Anne HahnWillis at the time who also ordered a sleep study. In the meantime she has been followed by her primary care physician, and has seen Dr. Lolly MustacheArfeen. She has a diagnosis of hypothyroidism, fibromyalgia, excessive fatigue. She reports that she had been under more stress at work and at home. The patient is also a mother of 2 with her youngest son will be 40 years old.After the birth of either she experienced temporary spells of weakness. With each of the deliveries she had received magnesium, which can be the most likely cause for  perinatal weakness.  Chief complaint according to patient :"depression, leaden fatigue, weight gain, and myalgia" I sometimes feel it coming down on me and I just need to sleep, have taken 2 naps while waiting here."  Sleep habits are as follows: The patient usually goes to bed around 8:30 PM, she takes Xanax at bedtime and is promptly asleep within 20-30 minutes. The bedroom is cool, quiet and dark. She prefers to sleep on her  side or prone , and sleeps on one pillow.  I sleeps through the night, wakes up at 6 AM, on weekends she stays much longer in bed -until 10 AM. She estimates 9 hours of sleep on average. She wakes without headaches or dry mouth. She dreams a lot, has one or none bathroom break. Her husband noted her just to have started to snore, and suspected apnea.  She naps frequently in daytime. She works as a Runner, broadcasting/film/video in [employer], product/process development. Her naps are 3-5 minutes long. Excessive daytime sleepiness and fatigue, 3-5 minute power naps, wakes up from her own snoring. Sleep paralysis in the past, none recent. Immediate dreams when napping. She cannot recall cataplectic spells.   02/04/2019 - 03/05/2019 Usage 02/04/2019 - 03/05/2019 Usage days 26/30 days (87%) >= 4 hours 19 days (63%) < 4 hours 7 days (23%) Usage hours 148 hours 6 minutes Average usage (total days) 4 hours 56 minutes Average usage (days used) 5 hours 42 minutes Median usage (days used) 5 hours 49 minutes Total used hours (value since last reset - 03/05/2019) 2,006 hours AirSense 10 AutoSet For Her Serial number [id number] Mode AutoSet Min Pressure 5 cmH2O Max Pressure 12 cmH2O EPR Fulltime EPR level 3 Response Standard Therapy Pressure - cmH2O Median: 6.4 95th percentile: 8.7 Maximum: 9.7 Leaks - L/min Median: 0.0 95th percentile: 2.8 Maximum: 7.7 Events per hour AI: 0.2 HI: 0.1 AHI: 0.3 Apnea Index Central: 0.0 Obstructive: 0.2 Unknown: 0.0 RERA Index 0.0 Cheyne-Stokes respiration (average duration per night) 0 minutes (0%)   Observations/Objective:  Generalized: Well developed, in no acute distress  Mentation: Alert oriented to time, place, history taking. Follows all commands speech and language fluent   Assessment and Plan:  38 y.o. year old female  has a past medical history of Anemia, Anxiety, Constipation, Depression, Fibromyalgia, GERD (gastroesophageal reflux disease), Hemorrhoids, HSV-1  infection, Hypertension, Hypothyroidism, OSA on CPAP, Other abnormal glucose, and Preeclampsia. with    ICD-10-CM   1. Obstructive sleep apnea treated with continuous positive airway pressure (CPAP) G47.33    Z99.89    She is doing better from a compliance standpoint.  She is tolerating CPAP therapy.  We have discussed need for continued use greater than 4 hours each night.  She will continue working on this.  I am reassured that she feels better.  I have advised yearly follow-up.  She is aware that she should reach out to me sooner should she have concerns of not being able to use CPAP for greater than 4 hours each night.  She verbalizes understanding and agreement with the plan.  No orders of the defined types were placed in this encounter.   No orders of the defined types were placed in this encounter.    Follow Up Instructions:  I discussed the assessment and treatment plan with the patient. The patient was provided an opportunity to ask questions and all were answered. The patient agreed with the plan and demonstrated an understanding of the instructions.   The patient was advised to call back  or seek an in-person evaluation if the symptoms worsen or if the condition fails to improve as anticipated.  I provided 25 minutes of non-face-to-face time during this encounter.  Patient is located at her place of residence during video conference.  Provider is located at her place of residence.  Alverda Skeans, RN help to facilitate visit.   Shawnie Dapper, NP

## 2019-03-08 LAB — NUSWAB VAGINITIS PLUS (VG+)
Candida albicans, NAA: NEGATIVE
Candida glabrata, NAA: NEGATIVE
Chlamydia trachomatis, NAA: NEGATIVE
Neisseria gonorrhoeae, NAA: NEGATIVE
Trich vag by NAA: NEGATIVE

## 2019-03-12 ENCOUNTER — Encounter: Payer: Self-pay | Admitting: Physician Assistant

## 2019-03-18 ENCOUNTER — Ambulatory Visit: Payer: BC Managed Care – PPO | Admitting: Adult Health

## 2019-04-01 ENCOUNTER — Other Ambulatory Visit: Payer: Self-pay | Admitting: Physician Assistant

## 2019-04-02 ENCOUNTER — Other Ambulatory Visit: Payer: Self-pay

## 2019-04-02 ENCOUNTER — Ambulatory Visit
Admission: RE | Admit: 2019-04-02 | Discharge: 2019-04-02 | Disposition: A | Discharge status: Home / Self Care | Payer: BC Managed Care – PPO | Source: Ambulatory Visit | Attending: Physician Assistant | Admitting: Physician Assistant

## 2019-04-02 DIAGNOSIS — Z1231 Encounter for screening mammogram for malignant neoplasm of breast: Secondary | ICD-10-CM

## 2019-04-09 NOTE — Progress Notes (Addendum)
Complete Physical  Assessment and Plan:  Encounter for general adult medical examination with abnormal findings 1 year  Essential hypertension - continue medications, DASH diet, exercise and monitor at home. Call if greater than 130/80.  -     CBC with Differential/Platelet -     COMPLETE METABOLIC PANEL WITH GFR -     TSH -     Urinalysis, Routine w reflex microscopic -     Microalbumin / creatinine urine ratio -     EKG 12-Lead  Obstructive sleep apnea treated with continuous positive airway pressure (CPAP) Continue follow up  Paraparesis (HCC) Continue follow up  Hypothyroidism, unspecified type -     TSH Hypothyroidism-check TSH level, continue medications the same, reminded to take on an empty stomach 30-69mins before food.   Hepatic steatosis -     COMPLETE METABOLIC PANEL WITH GFR Check labs, avoid tylenol, alcohol, weight loss advised.   Hepatomegaly Check labs, avoid tylenol, alcohol, weight loss advised.   Morbid obesity (HCC) - follow up 3 months for progress monitoring - increase veggies, decrease carbs - long discussion about weight loss, diet, and exercise  Recurrent major depressive disorder, in partial remission (HCC) Continue follow up Dr. Evelene Croon, encouraged trial of ADD med that was offered For depression and narcolepsy  Hyperlipidemia LDL goal <100 -     Lipid panel check lipids decrease fatty foods increase activity.   Abnormal glucose -     Hemoglobin A1c Discussed disease progression and risks Discussed diet/exercise, weight management and risk modification Patient is on abilify  Medication management -     Magnesium  Fibromyalgia Check labs Increase movement  Liver lesion, left lobe Monitor  Myalgia ? FM, check labs Has had negative ANA, antiDNA etc -     Iron,Total/Total Iron Binding Cap -     Ferritin -     Vitamin B12 -     Sedimentation rate -     Aldolase  Vitamin D deficiency -     VITAMIN D 25 Hydroxy (Vit-D  Deficiency, Fractures)  Screening, anemia, deficiency, iron -     Iron,Total/Total Iron Binding Cap -     Ferritin -     Vitamin B12  GERD Stop ETOH ( has been having 4 drinks a week) Add on prilosec, has at home If not better will refer to GI  Discussed med's effects and SE's. Screening labs and tests as requested with regular follow-up as recommended.  HPI  38 y.o. female  presents for a complete physical.  Her blood pressure has been controlled at home, today their BP is BP: 118/74.  She does workout. She denies chest pain, shortness of breath, dizziness.  BMI is Body mass index is 37.97 kg/m., she is working on diet and exercise. Wt Readings from Last 3 Encounters:  04/10/19 207 lb 9.6 oz (94.2 kg)  03/05/19 201 lb 12.8 oz (91.5 kg)  12/31/18 198 lb 8 oz (90 kg)    She has fibromyalgia (reports diagnosed several years ago ~8 years), has seen rheumatology Dr. Corliss Skains remotely.   She has hx of OSA on sleep apnea, had extensive problems with hypersomnia, following with neuro.  Has depression/anxiety managed by Dr. Evelene Croon.  STATE THAT HER BODY HAS BEEN ACHING WITH ANYTHING, MAINLY HER LEGS AND ARMS X 1 MONTH. Neg ANA, AntiDNA, no tick exposure. Normal PTH.  Lab Results  Component Value Date   CALCIUM 9.5 10/24/2018  PTH 34 2019  She has GI complaints, following with Dr.  Martin at [facility]. Had colonoscopy 11/22/2017, due 11/2022.  She had MRCP 12/2017 showes small posterolateral segment left liver lesion seen on previous CT measures 16 mm. Suggested MRI follow up 3-6 months.   She is on cholesterol medication and denies myalgias. Her cholesterol is at goal. The cholesterol last visit was:  Lab Results  Component Value Date   CHOL 212 (H) 09/11/2018   HDL 50 (L) 09/11/2018   LDLCALC 122 (H) 09/11/2018   LDLDIRECT 114.9 04/30/2012   TRIG 257 (H) 09/11/2018   CHOLHDL 4.2 09/11/2018  . She has been working on diet and exercise for prediabetes, she is not on bASA, she is not  on ACE/ARB and denies foot ulcerations, hyperglycemia, hypoglycemia , increased appetite, nausea, paresthesia of the feet, polydipsia, polyuria, visual disturbances, vomiting and weight loss. Last A1C in the office was:  Lab Results  Component Value Date   HGBA1C 6.0 (H) 09/11/2018    Patient is on Vitamin D supplement.   Lab Results  Component Value Date   VD25OH 56 09/11/2018     She is on thyroid medication. Her medication was not changed last visit.  She is on 1 pill daily of 150mg  but 1/2 Tues, Thursday. Lab Results  Component Value Date   TSH 4.82 (H) 09/11/2018  .   Current Medications:  Current Outpatient Medications on File Prior to Visit  Medication Sig  . ALPRAZolam (XANAX) 0.5 MG tablet Take 0.5 mg by mouth as needed.   . ARIPiprazole ([name]) 10 MG tablet Take one tablet by mouth daily (Patient taking differently: Take 10 mg by mouth daily. Take one tablet by mouth daily)  . Cholecalciferol (VITAMIN D3) 125 MCG (5000 UT) CAPS Take 1 capsule by mouth daily.  . Ferrous Sulfate 140 (45 Fe) MG TBCR Take 1 tablet by mouth daily.  Marland Kitchen. L-Methylfolate-Algae (DEPLIN 15 PO) Take by mouth.  . levothyroxine (SYNTHROID) 150 MCG tablet TAKE 1 TABLET BY MOUTH EVERY DAY BEFORE BREAKFAST (Patient taking differently: TAKE 1 TABLET BY MOUTH EVERY DAY BEFORE BREAKFAST EXCEPT Tuesday and Thursday takes 1/2 tablet.)  . pravastatin (PRAVACHOL) 40 MG tablet TAKE 1 TABLET BY MOUTH EVERY DAY  . vortioxetine HBr ([name]) 20 MG TABS tablet Take 20 mg by mouth daily.   No current facility-administered medications on file prior to visit.     Health Maintenance:   Immunization History  Administered Date(s) Administered  . Influenza Inj Mdck Quad Pf 09/11/2018  . Influenza Split 09/09/2015  . Influenza,inj,quad, With Preservative 09/19/2016  . Influenza-Unspecified 08/05/2013  . PPD Test 06/04/2018  . Td 11/21/1997  . Tdap 01/20/2011   Tetanus: 2012 Flu vaccine:2019 Pap: 2019 neg HPV Dr.  Daphine DeutscherMartin every 3 years MGM 03/2019 CT AB 05/2017 MRI AB 09/2018 Colonoscopy: 2019 Dr. Caryl NeverJue Last Dental Exam:  Twice yearly Last Eye Exam:  Dr. Lorin PicketScott, June 2019  Patient Care Team: Lucky CowboyMcKeown, William, MD as PCP - General (Internal Medicine) Fredrich BirksScott, Jon, OD as Referring Physician (Optometry) Sherian ReinBovard-Stuckert, Jody, MD as Consulting Physician (Obstetrics and Gynecology) Dairl PonderWeingold, Matthew, MD as Consulting Physician (Orthopedic Surgery) Elvina SidleBalakrishnan, Nikhil, MD (Neurology) Venancio PoissonLomax, Laura, MD as Consulting Physician (Dermatology) Pollyann Savoyeveshwar, Shaili, MD as Consulting Physician (Rheumatology) Lolly MustacheArfeen, [name], MD as Consulting Physician (Psychiatry)   Medical History:  Past Medical History:  Diagnosis Date  . Anemia   . Anxiety   . Constipation   . Depression   . Fibromyalgia   . GERD (gastroesophageal reflux disease)   . Hemorrhoids   . HSV-1 infection   .  Hypertension   . Hypothyroidism   . OSA on CPAP   . Other abnormal glucose   . Preeclampsia    Allergies Allergies  Allergen Reactions  . Lamictal [Lamotrigine] Rash  . Doxycycline Other (See Comments)    GI UPSET  . Effexor [Venlafaxine] Other (See Comments)    DYSPHORIA  . Prednisone Other (See Comments)    FLUSHED  . Savella [Milnacipran Hcl] Other (See Comments)    NO RELIEF    SURGICAL HISTORY She  has a past surgical history that includes Cholecystectomy (2006); Tonsillectomy; and Wisdom tooth extraction. FAMILY HISTORY Her family history includes Alcohol abuse in her brother; Bipolar disorder in her brother and mother; Cancer in her maternal grandmother; Cervical cancer in her mother; Colon polyps in her mother; Diabetes in her cousin, maternal aunt, maternal grandmother, and mother; Heart disease in her maternal aunt and maternal grandmother; Hyperlipidemia in her maternal grandmother; Hypertension in her maternal grandmother; Hypothyroidism in her daughter, maternal grandmother, and mother; Kidney disease in her brother;  Lung cancer in her maternal grandfather. SOCIAL HISTORY She  reports that she has never smoked. She has never used smokeless tobacco. She reports current alcohol use. She reports that she does not use drugs.  Review of Systems: Review of Systems  Constitutional: Negative for chills, fever and malaise/fatigue.  Respiratory: Negative for cough, shortness of breath and wheezing.   Cardiovascular: Negative for chest pain, palpitations and leg swelling.  Gastrointestinal: Negative for abdominal pain, blood in stool, constipation, diarrhea, heartburn and melena.  Genitourinary: Negative.   Musculoskeletal: Positive for myalgias.  Skin: Negative.   Neurological: Negative for dizziness, tingling, tremors, sensory change, speech change, focal weakness, seizures, loss of consciousness and headaches.  Psychiatric/Behavioral: Positive for depression. The patient is nervous/anxious. The patient does not have insomnia.     Physical Exam: Estimated body mass index is 37.97 kg/m as calculated from the following:   Height as of this encounter: 5\' 2"  (1.575 m).   Weight as of this encounter: 207 lb 9.6 oz (94.2 kg). BP 118/74   Pulse 79   Temp 97.8 F (36.6 C)   Ht 5\' 2"  (1.575 m)   Wt 207 lb 9.6 oz (94.2 kg)   LMP 03/22/2019   SpO2 99%   BMI 37.97 kg/m   General Appearance: Well nourished well developed, in no apparent distress.  Eyes: PERRLA, EOMs, conjunctiva no swelling or erythema ENT/Mouth: Ear canals normal without obstruction, swelling, erythema, or discharge.  TMs normal bilaterally with no erythema, bulging, retraction, or loss of landmark.  Oropharynx moist and clear with no exudate, erythema, or swelling. Neck: Supple, thyroid normal. No bruits.  No cervical adenopathy Respiratory: Respiratory effort normal, Breath sounds clear A&P without wheeze, rhonchi, rales.   Cardio: RRR without murmurs, rubs or gallops. Brisk peripheral pulses without edema.  Chest: symmetric, with normal  excursions Abdomen: Soft, nontender, no guarding, rebound, hernias, masses, or organomegaly.  Lymphatics: Non tender without lymphadenopathy.   Musculoskeletal: Full ROM all peripheral extremities,5/5 strength, and normal gait.  Skin: Warm, dry without rashes, lesions, ecchymosis. Neuro: Awake and oriented X 3, Cranial nerves intact, reflexes equal bilaterally. Normal muscle tone, no cerebellar symptoms. Sensation intact.  Psych:  Flat and depressed affect, Insight and Judgment appropriate.   EKG: WNL no changes.  Over 40 minutes of exam, counseling, chart review and critical decision making was performed  Quentin Mulling 4:00 PM [facility]

## 2019-04-10 ENCOUNTER — Other Ambulatory Visit: Payer: Self-pay

## 2019-04-10 ENCOUNTER — Ambulatory Visit (INDEPENDENT_AMBULATORY_CARE_PROVIDER_SITE_OTHER): Payer: BC Managed Care – PPO | Admitting: Physician Assistant

## 2019-04-10 ENCOUNTER — Encounter: Payer: Self-pay | Admitting: Physician Assistant

## 2019-04-10 VITALS — BP 118/74 | HR 79 | Temp 97.8°F | Ht 62.0 in | Wt 207.6 lb

## 2019-04-10 DIAGNOSIS — K760 Fatty (change of) liver, not elsewhere classified: Secondary | ICD-10-CM

## 2019-04-10 DIAGNOSIS — Z0000 Encounter for general adult medical examination without abnormal findings: Secondary | ICD-10-CM

## 2019-04-10 DIAGNOSIS — Z13220 Encounter for screening for lipoid disorders: Secondary | ICD-10-CM | POA: Diagnosis not present

## 2019-04-10 DIAGNOSIS — M791 Myalgia, unspecified site: Secondary | ICD-10-CM

## 2019-04-10 DIAGNOSIS — Z79899 Other long term (current) drug therapy: Secondary | ICD-10-CM | POA: Diagnosis not present

## 2019-04-10 DIAGNOSIS — I10 Essential (primary) hypertension: Secondary | ICD-10-CM

## 2019-04-10 DIAGNOSIS — R7309 Other abnormal glucose: Secondary | ICD-10-CM

## 2019-04-10 DIAGNOSIS — R160 Hepatomegaly, not elsewhere classified: Secondary | ICD-10-CM

## 2019-04-10 DIAGNOSIS — F3341 Major depressive disorder, recurrent, in partial remission: Secondary | ICD-10-CM

## 2019-04-10 DIAGNOSIS — K2100 Gastro-esophageal reflux disease with esophagitis, without bleeding: Secondary | ICD-10-CM

## 2019-04-10 DIAGNOSIS — Z1329 Encounter for screening for other suspected endocrine disorder: Secondary | ICD-10-CM | POA: Diagnosis not present

## 2019-04-10 DIAGNOSIS — Z131 Encounter for screening for diabetes mellitus: Secondary | ICD-10-CM

## 2019-04-10 DIAGNOSIS — Z136 Encounter for screening for cardiovascular disorders: Secondary | ICD-10-CM | POA: Diagnosis not present

## 2019-04-10 DIAGNOSIS — Z1389 Encounter for screening for other disorder: Secondary | ICD-10-CM | POA: Diagnosis not present

## 2019-04-10 DIAGNOSIS — E039 Hypothyroidism, unspecified: Secondary | ICD-10-CM

## 2019-04-10 DIAGNOSIS — K769 Liver disease, unspecified: Secondary | ICD-10-CM

## 2019-04-10 DIAGNOSIS — Z0001 Encounter for general adult medical examination with abnormal findings: Secondary | ICD-10-CM

## 2019-04-10 DIAGNOSIS — M797 Fibromyalgia: Secondary | ICD-10-CM

## 2019-04-10 DIAGNOSIS — E785 Hyperlipidemia, unspecified: Secondary | ICD-10-CM

## 2019-04-10 DIAGNOSIS — G723 Periodic paralysis: Secondary | ICD-10-CM

## 2019-04-10 DIAGNOSIS — G8220 Paraplegia, unspecified: Secondary | ICD-10-CM

## 2019-04-10 DIAGNOSIS — Z9989 Dependence on other enabling machines and devices: Secondary | ICD-10-CM

## 2019-04-10 DIAGNOSIS — G4733 Obstructive sleep apnea (adult) (pediatric): Secondary | ICD-10-CM

## 2019-04-10 DIAGNOSIS — Z130 Encounter for screening for diseases of the blood and blood-forming organs and certain disorders involving the immune mechanism: Secondary | ICD-10-CM | POA: Diagnosis not present

## 2019-04-10 DIAGNOSIS — E559 Vitamin D deficiency, unspecified: Secondary | ICD-10-CM | POA: Diagnosis not present

## 2019-04-10 DIAGNOSIS — G47419 Narcolepsy without cataplexy: Secondary | ICD-10-CM

## 2019-04-10 NOTE — Patient Instructions (Signed)
Try the adderall that Dr. Evelene Croon is offering.  Is the depression causing the fatigue or is the fatigue (narcolepsy) causing the depression.   Take omeprazole over the counter for 2 weeks-4 weeks, then go to pepcid 20 or 40mg  at night for 2 weeks, then you can stop or continue as needed.   Avoid alcohol, spicy foods, NSAIDS (aleve, ibuprofen) at this time.  See foods below.   Will go to the ER if they have any melena (black stool), hematochezia (blood in stool), coffee ground vomiting, severe pain, severe shortness of breath or chest pain.   Food Choices for Gastroesophageal Reflux Disease When you have gastroesophageal reflux disease (GERD), the foods you eat and your eating habits are very important. Choosing the right foods can help ease the discomfort of GERD. WHAT GENERAL GUIDELINES DO I NEED TO FOLLOW?  Choose fruits, vegetables, whole grains, low-fat dairy products, and low-fat meat, fish, and poultry.  Limit fats such as oils, salad dressings, butter, nuts, and avocado.  Keep a food diary to identify foods that cause symptoms.  Avoid foods that cause reflux. These may be different for different people.  Eat frequent small meals instead of three large meals each day.  Eat your meals slowly, in a relaxed setting.  Limit fried foods.  Cook foods using methods other than frying.  Avoid drinking alcohol.  Avoid drinking large amounts of liquids with your meals.  Avoid bending over or lying down until 2-3 hours after eating. WHAT FOODS ARE NOT RECOMMENDED? The following are some foods and drinks that may worsen your symptoms: Vegetables Tomatoes. Tomato juice. Tomato and spaghetti sauce. Chili peppers. Onion and garlic. Horseradish. Fruits Oranges, grapefruit, and lemon (fruit and juice). Meats High-fat meats, fish, and poultry. This includes hot dogs, ribs, ham, sausage, salami, and bacon. Dairy Whole milk and chocolate milk. Sour cream. Cream. Butter. Ice cream. Cream  cheese.  Beverages Coffee and tea, with or without caffeine. Carbonated beverages or energy drinks. Condiments Hot sauce. Barbecue sauce.  Sweets/Desserts Chocolate and cocoa. Donuts. Peppermint and spearmint. Fats and Oils High-fat foods, including [place] fries and potato chips. Other Vinegar. Strong spices, such as black pepper, white pepper, red pepper, cayenne, curry powder, cloves, ginger, and chili powder.     When it comes to diets, agreement about the perfect plan isn't easy to find, even among the experts. Experts at the [facility] of [employer] developed an idea known as the Healthy Eating Plate. Just imagine a plate divided into logical, healthy portions.  The emphasis is on diet quality:  Load up on vegetables and fruits - one-half of your plate: Aim for color and variety, and remember that potatoes don't count.  Go for whole grains - one-quarter of your plate: Whole wheat, barley, wheat berries, quinoa, oats, brown rice, and foods made with them. If you want pasta, go with whole wheat pasta.  Protein power - one-quarter of your plate: Fish, chicken, beans, and nuts are all healthy, versatile protein sources. Limit red meat.  The diet, however, does go beyond the plate, offering a few other suggestions.  Use healthy plant oils, such as olive, canola, soy, corn, sunflower and peanut. Check the labels, and avoid partially hydrogenated oil, which have unhealthy trans fats.  If you're thirsty, drink water. Coffee and tea are good in moderation, but skip sugary drinks and limit milk and dairy products to one or two daily servings.  The type of carbohydrate in the diet is more important than the  amount. Some sources of carbohydrates, such as vegetables, fruits, whole grains, and beans-are healthier than others.  Finally, stay active.

## 2019-04-11 LAB — URINALYSIS, ROUTINE W REFLEX MICROSCOPIC
Bacteria, UA: NONE SEEN /HPF
Bilirubin Urine: NEGATIVE
Glucose, UA: NEGATIVE
Hgb urine dipstick: NEGATIVE
Hyaline Cast: NONE SEEN /LPF
Ketones, ur: NEGATIVE
Nitrite: NEGATIVE
Protein, ur: NEGATIVE
Specific Gravity, Urine: 1.013 (ref 1.001–1.03)
pH: 7 (ref 5.0–8.0)

## 2019-04-11 LAB — MAGNESIUM: Magnesium: 2.1 mg/dL (ref 1.5–2.5)

## 2019-04-11 LAB — CBC WITH DIFFERENTIAL/PLATELET
Absolute Monocytes: 612 cells/uL (ref 200–950)
Basophils Absolute: 9 cells/uL (ref 0–200)
Basophils Relative: 0.1 %
Eosinophils Absolute: 36 cells/uL (ref 15–500)
Eosinophils Relative: 0.4 %
HCT: 38.1 % (ref 35.0–45.0)
Hemoglobin: 12.7 g/dL (ref 11.7–15.5)
Lymphs Abs: 2880 cells/uL (ref 850–3900)
MCH: 29 pg (ref 27.0–33.0)
MCHC: 33.3 g/dL (ref 32.0–36.0)
MCV: 87 fL (ref 80.0–100.0)
MPV: 11.6 fL (ref 7.5–12.5)
Monocytes Relative: 6.8 %
Neutro Abs: 5463 cells/uL (ref 1500–7800)
Neutrophils Relative %: 60.7 %
Platelets: 186 10*3/uL (ref 140–400)
RBC: 4.38 10*6/uL (ref 3.80–5.10)
RDW: 13 % (ref 11.0–15.0)
Total Lymphocyte: 32 %
WBC: 9 10*3/uL (ref 3.8–10.8)

## 2019-04-11 LAB — COMPLETE METABOLIC PANEL WITH GFR
AG Ratio: 1.7 (calc) (ref 1.0–2.5)
ALT: 31 U/L — ABNORMAL HIGH (ref 6–29)
AST: 33 U/L — ABNORMAL HIGH (ref 10–30)
Albumin: 4.4 g/dL (ref 3.6–5.1)
Alkaline phosphatase (APISO): 62 U/L (ref 31–125)
BUN: 12 mg/dL (ref 7–25)
CO2: 25 mmol/L (ref 20–32)
Calcium: 9.7 mg/dL (ref 8.6–10.2)
Chloride: 104 mmol/L (ref 98–110)
Creat: 0.87 mg/dL (ref 0.50–1.10)
GFR, Est African American: 97 mL/min/{1.73_m2} (ref >=60)
GFR, Est Non African American: 83 mL/min/{1.73_m2} (ref >=60)
Globulin: 2.6 g/dL (calc) (ref 1.9–3.7)
Glucose, Bld: 97 mg/dL (ref 65–99)
Potassium: 3.9 mmol/L (ref 3.5–5.3)
Sodium: 139 mmol/L (ref 135–146)
Total Bilirubin: 0.4 mg/dL (ref 0.2–1.2)
Total Protein: 7 g/dL (ref 6.1–8.1)

## 2019-04-11 LAB — SEDIMENTATION RATE: Sed Rate: 29 mm/h — ABNORMAL HIGH (ref 0–20)

## 2019-04-11 LAB — ALDOLASE: Aldolase: 7.4 U/L (ref <=8.1)

## 2019-04-11 LAB — HEMOGLOBIN A1C
Hgb A1c MFr Bld: 5.9 % of total Hgb — ABNORMAL HIGH (ref <5.7)
Mean Plasma Glucose: 123 (calc)
eAG (mmol/L): 6.8 (calc)

## 2019-04-11 LAB — MICROALBUMIN / CREATININE URINE RATIO
Creatinine, Urine: 52 mg/dL (ref 20–275)
Microalb Creat Ratio: 8 mcg/mg creat (ref <30)
Microalb, Ur: 0.4 mg/dL

## 2019-04-11 LAB — LIPID PANEL
Cholesterol: 196 mg/dL (ref <200)
HDL: 45 mg/dL — ABNORMAL LOW (ref >=50)
LDL Cholesterol (Calc): 111 mg/dL (calc) — ABNORMAL HIGH
Non-HDL Cholesterol (Calc): 151 mg/dL (calc) — ABNORMAL HIGH (ref <130)
Total CHOL/HDL Ratio: 4.4 (calc) (ref <5.0)
Triglycerides: 282 mg/dL — ABNORMAL HIGH (ref <150)

## 2019-04-11 LAB — TSH: TSH: 5.45 mIU/L — ABNORMAL HIGH

## 2019-04-11 LAB — IRON, TOTAL/TOTAL IRON BINDING CAP
%SAT: 10 % (calc) — ABNORMAL LOW (ref 16–45)
Iron: 37 ug/dL — ABNORMAL LOW (ref 40–190)
TIBC: 358 mcg/dL (calc) (ref 250–450)

## 2019-04-11 LAB — VITAMIN D 25 HYDROXY (VIT D DEFICIENCY, FRACTURES): Vit D, 25-Hydroxy: 52 ng/mL (ref 30–100)

## 2019-04-11 LAB — FERRITIN: Ferritin: 85 ng/mL (ref 16–154)

## 2019-04-11 LAB — VITAMIN B12: Vitamin B-12: 577 pg/mL (ref 200–1100)

## 2019-05-15 ENCOUNTER — Other Ambulatory Visit: Payer: Self-pay | Admitting: Internal Medicine

## 2019-06-18 ENCOUNTER — Other Ambulatory Visit: Payer: Self-pay

## 2019-06-18 ENCOUNTER — Encounter: Payer: Self-pay | Admitting: Physician Assistant

## 2019-06-18 ENCOUNTER — Ambulatory Visit: Payer: BC Managed Care – PPO | Admitting: Physician Assistant

## 2019-06-18 VITALS — BP 128/88 | HR 94 | Temp 98.1°F | Wt 200.4 lb

## 2019-06-18 DIAGNOSIS — G8220 Paraplegia, unspecified: Secondary | ICD-10-CM

## 2019-06-18 DIAGNOSIS — G5603 Carpal tunnel syndrome, bilateral upper limbs: Secondary | ICD-10-CM

## 2019-06-18 DIAGNOSIS — M25561 Pain in right knee: Secondary | ICD-10-CM

## 2019-06-18 NOTE — Progress Notes (Signed)
Subjective:    Patient ID: Lauren [name], female    DOB: 13-Nov-1981, 38 y.o.   MRN: [id number]  HPI 38 y.o. right handed obese WF that has long standing history of anxiety, FM, OSA with parasomnias follows with neurology presents with hand pain and numbness and right knee  She started on adderall XL 20 mg and states she is feeling much better, having more energy and more her self.   States will have numbness bilateral hands at night x 2 weeks, worse in the night. Had worn a brace at night that has helped some x 1.5 weeks.  States has had pain shoot up arms while driving, radial finger pain worse on the phone and with painting a door yesterday, worse with using the computer or mouse. . Better with shaking her hands out.  Pain in her hands is getting worse.  Normal MRI 2013 of cervical spine.  Lab Results  Component Value Date   [id number] 04/10/2019   Blood pressure 128/88, pulse 94, temperature 98.1 F (36.7 C), weight 200 lb 6.4 oz (90.9 kg), last menstrual period 06/10/2019, SpO2 99 %.   Medications Current Outpatient Medications on File Prior to Visit  Medication Sig  . ALPRAZolam (XANAX) 0.5 MG tablet Take 0.5 mg by mouth as needed.   . ARIPiprazole ([name]) 10 MG tablet Take one tablet by mouth daily (Patient taking differently: Take 10 mg by mouth daily. Take one tablet by mouth daily)  . Cholecalciferol (VITAMIN D3) 125 MCG (5000 UT) CAPS Take 1 capsule by mouth daily.  . Ferrous Sulfate 140 (45 Fe) MG TBCR Take 1 tablet by mouth daily.  Marland Kitchen L-Methylfolate-Algae (DEPLIN 15 PO) Take by mouth.  . levothyroxine (SYNTHROID) 150 MCG tablet TAKE 1 TABLET BY MOUTH EVERY DAY BEFORE BREAKFAST  . pravastatin (PRAVACHOL) 40 MG tablet TAKE 1 TABLET BY MOUTH EVERY DAY  . vortioxetine HBr ([name]) 20 MG TABS tablet Take 20 mg by mouth daily.   No current facility-administered medications on file prior to visit.     Problem list She has Hypothyroidism; Paralysis, periodic  secondary; Depression; Hypertension; Abnormal glucose; Hyperlipidemia LDL goal <100; Morbid obesity (Lauren [name]); Medication management; Paraparesis ([place]); Headache associated with sexual activity; Dizziness; Hepatomegaly; Hepatic steatosis; Recurrent isolated sleep paralysis; Hypersomnia with sleep apnea; Obstructive sleep apnea treated with continuous positive airway pressure (CPAP); Fibromyalgia; Liver lesion, left lobe; and Narcolepsy on their problem list.    Review of Systems  Constitutional: Negative.   HENT: Negative.   Respiratory: Negative.   Cardiovascular: Negative.   Gastrointestinal: Negative.   Genitourinary: Negative.   Musculoskeletal: Positive for myalgias. Negative for arthralgias, back pain, gait problem, joint swelling, neck pain and neck stiffness.  Neurological: Positive for numbness. Negative for dizziness, tremors, seizures, syncope, facial asymmetry, speech difficulty, weakness, light-headedness and headaches.       Objective:   Physical Exam Neurological:     Comments: Bilateral hand exam vascular intact, left hand with abnormal sensation on radial side.  No thenar eminence wasting.  Positive tinels and phalens bilaterally. Bilateral knees with TTP of the joint line.  Normal ligamentous testing.  Negative mcmurrays.              Assessment & Plan:  Lauren [name] was seen today for acute visit, numbness and other.  Acute pain of right knee -     DG Knee Complete 4 Views Right; Future  Bilateral carpal tunnel syndrome -     Ambulatory referral to Orthopedics - Bilateral carpal  tunnel -wear a brace at night 6-8 weeks, RICE, NSAIDS,  will refer to ortho    Future Appointments  Date Time Provider Department Center  07/18/2019  3:30 PM Quentin MullingCollier, Amanda, PA-C [facility] None  03/04/2020  3:30 PM Shawnie DapperLomax, Amy, NP [facility] None  04/15/2020  3:00 PM Quentin Mullingollier, Amanda, PA-C [facility] None

## 2019-06-18 NOTE — Patient Instructions (Signed)
Do the carpal tunnel brace from a pharmacy at night for 4-6 weeks Will refer to ortho for evaluation   Carpal Tunnel Syndrome  Carpal tunnel syndrome is a condition that causes pain in your hand and arm. The carpal tunnel is a narrow area located on the palm side of your wrist. Repeated wrist motion or certain diseases may cause swelling within the tunnel. This swelling pinches the main nerve in the wrist (median nerve). What are the causes? This condition may be caused by:  Repeated wrist motions.  Wrist injuries.  Arthritis.  A cyst or tumor in the carpal tunnel.  Fluid buildup during pregnancy.  Sometimes the cause of this condition is not known. What increases the risk? This condition is more likely to develop in:  People who have jobs that cause them to repeatedly move their wrists in the same motion, such as Art gallery manager.  Women.  People with certain conditions, such as: ? Diabetes. ? Obesity. ? An underactive thyroid (hypothyroidism). ? Kidney failure.  What are the signs or symptoms? Symptoms of this condition include:  A tingling feeling in your fingers, especially in your thumb, index, and middle fingers.  Tingling or numbness in your hand.  An aching feeling in your entire arm, especially when your wrist and elbow are bent for long periods of time.  Wrist pain that goes up your arm to your shoulder.  Pain that goes down into your palm or fingers.  A weak feeling in your hands. You may have trouble grabbing and holding items.  Your symptoms may feel worse during the night. How is this diagnosed? This condition is diagnosed with a medical history and physical exam. You may also have tests, including:  An electromyogram (EMG). This test measures electrical signals sent by your nerves into the muscles.  X-rays.  How is this treated? Treatment for this condition includes:  Lifestyle changes. It is important to stop doing or modify the  activity that caused your condition.  Physical or occupational therapy.  Medicines for pain and inflammation. This may include medicine that is injected into your wrist.  A wrist splint.  Surgery.  Follow these instructions at home: If you have a splint:  Wear it as told by your health care provider. Remove it only as told by your health care provider.  Loosen the splint if your fingers become numb and tingle, or if they turn cold and blue.  Keep the splint clean and dry. General instructions  Take over-the-counter and prescription medicines only as told by your health care provider.  Rest your wrist from any activity that may be causing your pain. If your condition is work related, talk to your employer about changes that can be made, such as getting a wrist pad to use while typing.  If directed, apply ice to the painful area: ? Put ice in a plastic bag. ? Place a towel between your skin and the bag. ? Leave the ice on for 20 minutes, 2-3 times per day.  Keep all follow-up visits as told by your health care provider. This is important.  Do any exercises as told by your health care provider, physical therapist, or occupational therapist. Contact a health care provider if:  You have new symptoms.  Your pain is not controlled with medicines.  Your symptoms get worse. This information is not intended to replace advice given to you by your health care provider. Make sure you discuss any questions you have with your health  care provider. Document Released: 11/04/2000 Document Revised: 03/17/2016 Document Reviewed: 03/25/2015 Elsevier Interactive Patient Education  [employer]2018 [employer]

## 2019-06-21 ENCOUNTER — Ambulatory Visit: Payer: BC Managed Care – PPO | Admitting: Orthopaedic Surgery

## 2019-06-21 ENCOUNTER — Other Ambulatory Visit: Payer: Self-pay

## 2019-06-21 ENCOUNTER — Encounter: Payer: Self-pay | Admitting: Orthopaedic Surgery

## 2019-06-21 DIAGNOSIS — G5603 Carpal tunnel syndrome, bilateral upper limbs: Secondary | ICD-10-CM | POA: Diagnosis not present

## 2019-06-21 MED ORDER — METHYLPREDNISOLONE 4 MG PO TBPK: ORAL_TABLET | ORAL | 0 refills | Status: DC

## 2019-06-21 NOTE — Progress Notes (Signed)
Office Visit Note   Patient: Lauren [name]           Date of Birth: 02/19/1981           MRN: [id number] Visit Date: 06/21/2019              Requested by: Quentin Mullingollier, Amanda, PA-C [id number] [place],  [place] [id number] PCP: Lucky CowboyMcKeown, William, MD   Assessment & Plan: Visit Diagnoses:  1. Carpal tunnel syndrome, bilateral     Plan: Insidious onset of bilateral hand numbness in the past 2 weeks.  Has history of carpal tunnel symptoms during pregnancies, fibromyalgia and borderline diabetes.  Symptoms are consistent with carpal tunnel syndrome.  Will wear bilateral splints and try Medrol Dosepak.  Office 3 weeks and consider EMGs and nerve conduction studies if no improvement  Follow-Up Instructions: Return in about 3 weeks (around 07/12/2019).   Orders:  No orders of the defined types were placed in this encounter.  No orders of the defined types were placed in this encounter.     Procedures: No procedures performed   Clinical Data: No additional findings.   Subjective: Chief Complaint  Patient presents with  . Right Hand - Pain  . Left Hand - Pain  Patient presents today for bilateral hand pain X2 weeks. No known injury.  She said that it initially started with numbness in her hands at night. It has progressed to pain in her fingers and her wrist. She has pain that travels up her arms. She said that it feels like "electric shocks". Both hurt the same. She has been wearing a brace on each arm at night, which has helped. She does not take anything for pain.  Has history of carpal tunnel syndrome during her pregnancies.  Also has history of fibromyalgia and "prediabetes".  HPI  Review of Systems   Objective: Vital Signs: BP 135/84   Pulse 71   Ht 5\' 1"  (1.549 m)   Wt 200 lb (90.7 kg)   LMP 06/10/2019   BMI 37.79 kg/m   Physical Exam Constitutional:      Appearance: She is well-developed.  Eyes:     Pupils: Pupils are equal, round, and  reactive to light.  Pulmonary:     Effort: Pulmonary effort is normal.  Skin:    General: Skin is warm and dry.  Neurological:     Mental Status: She is alert and oriented to person, place, and time.  Psychiatric:        Behavior: Behavior normal.     Ortho Exam positive Tinel's and Phalen's about the median nerves of both wrists.  Full range of motion of fingers.  Does have some lack of full supination on the right compared to the left but that apparently is chronic.  Good opposition of thumb to little finger.  Has mild tenderness over the ulnar nerve at both elbows with some tingling into the ulnar 2 digits.  Good capillary refill to fingers.  No shoulder pain.  No neck pain. Specialty Comments:  No specialty comments available.  Imaging: No results found.   PMFS History: Patient Active Problem List   Diagnosis Date Noted  . Carpal tunnel syndrome, bilateral 06/21/2019  . 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 Liver lesion, left lobe 10/04/2018  . Fibromyalgia 06/04/2018  . Obstructive sleep apnea treated with continuous positive airway pressure (CPAP) 05/23/2018  . 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 Hypersomnia with sleep apnea 01/16/2018  . Hepatomegaly 12/21/2017  .  Hepatic steatosis 12/21/2017  . Headache associated with sexual activity 03/08/2017  . Dizziness 03/08/2017  . Medication management 09/09/2015  . Abnormal glucose 06/02/2015  . Hyperlipidemia LDL goal <100 06/02/2015  . Morbid obesity ([place]) 06/02/2015  . Depression   . Hypertension   . Hypothyroidism 04/30/2012  . 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 Paraparesis ([place]) 02/02/2012   Past Medical History:  Diagnosis Date  . Anemia   . Anxiety   . Constipation   . Depression   . Fibromyalgia   . GERD (gastroesophageal reflux disease)   . Hemorrhoids   . HSV-1 infection   . Hypertension   . Hypothyroidism   . OSA on CPAP   . Other abnormal glucose   . Preeclampsia     Family History   Problem Relation Age of Onset  . Hypothyroidism Mother   . Cervical cancer Mother   . Bipolar disorder Mother   . Diabetes Mother   . Colon polyps Mother   . Bipolar disorder Brother   . Kidney disease Brother   . Alcohol abuse Brother   . Hypothyroidism Daughter   . Heart disease Maternal Grandmother   . Diabetes Maternal Grandmother   . Cancer Maternal Grandmother        SKIN  . Hyperlipidemia Maternal Grandmother   . Hypertension Maternal Grandmother   . Hypothyroidism Maternal Grandmother   . Lung cancer Maternal Grandfather   . Heart disease Maternal Aunt   . Diabetes Maternal Aunt   . Diabetes Cousin   . Colon cancer Neg Hx     Past Surgical History:  Procedure Laterality Date  . CHOLECYSTECTOMY  2006  . TONSILLECTOMY    . WISDOM TOOTH EXTRACTION     Social History   Occupational History  . Occupation: Product manager: [employer]  Tobacco Use  . Smoking status: Never Smoker  . Smokeless tobacco: Never Used  Substance and Sexual Activity  . Alcohol use: Yes    Alcohol/week: 0.0 standard drinks    Comment: occ  . Drug use: No  . Sexual activity: Yes    Partners: Male    Birth control/protection: None, Surgical    Comment: husband has had a vasectomy

## 2019-06-21 NOTE — Addendum Note (Signed)
Addended by: Lendon Collar on: 06/21/2019 12:01 PM   Modules accepted: Orders

## 2019-07-11 ENCOUNTER — Ambulatory Visit: Payer: BC Managed Care – PPO | Admitting: Orthopaedic Surgery

## 2019-07-15 NOTE — Progress Notes (Signed)
FOLLOW UP  Assessment and Plan:   Hypertension Well controlled off of medications at this time Monitor blood pressure at home; patient to call if consistently greater than 130/80  Continue DASH diet.   Reminder to go to the ER if any CP, SOB, nausea, dizziness, severe HA, changes vision/speech, left arm numbness and tingling and jaw pain.  Cholesterol Currently at LDL not goal; continue pravastatin; diet for trigs discussed, suggested omega 3 supplement Continue low cholesterol diet and exercise.  Check lipid panel.  May switch pravastatin to crestor to reach goal  Prediabetes Continue diet and exercise.  Perform daily foot/skin check, notify office of any concerning changes.  Check A1C  Hypothyroidism continue medications the same pending lab results reminded to take on an empty stomach 30-7260mins before food.  check TSH level  Morbid Obesity with co morbidities Long discussion about weight loss, diet, and exercise Recommended diet heavy in fruits and veggies and low in animal meats, cheeses, and dairy products, appropriate calorie intake Discussed ideal weight for height  Will follow up in 3 months  Vitamin D Def Below goal at last visit;  continue supplementation to maintain goal of 70-100 Check Vit D level  Depression/anxiety with sleep disturbance Can try melatonin, discussed good sleep habits Continue medications; follow up with psych  Lifestyle discussed: diet/exerise, sleep hygiene, stress management, hydration   Continue diet and meds as discussed. Further disposition pending results of labs. Discussed med's effects and SE's.   Over 30 minutes of exam, counseling, chart review, and critical decision making was performed.   Future Appointments  Date Time Provider Department Center  03/04/2020  3:30 PM Shawnie DapperLomax, Amy, NP [facility] None  04/15/2020  3:00 PM Quentin Mullingollier, Jadon Ressler, PA-C [id number] None     ----------------------------------------------------------------------------------------------------------------------  HPI 38 y.o. female  presents for 3 month follow up on hypertension, cholesterol, prediabetes, morbid obesity, hypothyroidism and vitamin D deficiency.   She has fibromyalgia (reports diagnosed several years ago ~8 years), has seen rheumatology Dr. Corliss Skainseveshwar remotely. She has hx of OSA on sleep apnea, had extensive problems with hypersomnia, following with neuro and on adderall for narcolepsy. Has been waking up at 2-3 AM once but she is staying at inlaws while house is being fixed, room is warm, lots of stress with work.  BMI is Body mass index is 37.98 kg/m., she has been working on diet and exercise. Wt Readings from Last 3 Encounters:  07/18/19 201 lb (91.2 kg)  06/21/19 200 lb (90.7 kg)  06/18/19 200 lb 6.4 oz (90.9 kg)   Her blood pressure has been controlled at home, today their BP is BP: 124/80  She does not workout. She denies chest pain, shortness of breath, dizziness.   She is on cholesterol medication Pravastatin 40 mg daily and denies myalgias. Her LDL cholesterol is not at goal; trigs are notably elevated. The cholesterol last visit was:   Lab Results  Component Value Date   CHOL 196 04/10/2019   HDL 45 (L) 04/10/2019   LDLCALC 111 (H) 04/10/2019   LDLDIRECT 114.9 04/30/2012   TRIG 282 (H) 04/10/2019   CHOLHDL 4.4 04/10/2019    She has been working on diet and exercise for prediabetes, and denies increased appetite, nausea, paresthesia of the feet, polydipsia, polyuria and visual disturbances. Last A1C in the office was:  Lab Results  Component Value Date   HGBA1C 5.9 (H) 04/10/2019   She is on thyroid medication. Her medication was not changed last visit.  Currently taking 1 tab DAILY  rather than 1/2 on Tues/Thurs Lab Results  Component Value Date   TSH 5.45 (H) 04/10/2019   Patient is on Vitamin D supplement.   Lab Results  Component Value  Date   VD25OH 52 04/10/2019        Current Medications:  Current Outpatient Medications on File Prior to Visit  Medication Sig  . ALPRAZolam (XANAX) 0.5 MG tablet Take 0.5 mg by mouth as needed.   Marland Kitchen. amphetamine-dextroamphetamine ([name]) 20 MG 24 hr capsule Take 20 mg by mouth daily.  . ARIPiprazole ([name]) 10 MG tablet Take one tablet by mouth daily (Patient taking differently: Take 10 mg by mouth daily. Take one tablet by mouth daily)  . Cholecalciferol (VITAMIN D3) 125 MCG (5000 UT) CAPS Take 1 capsule by mouth daily.  . Ferrous Sulfate 140 (45 Fe) MG TBCR Take 1 tablet by mouth daily.  Marland Kitchen. L-Methylfolate-Algae (DEPLIN 15 PO) Take by mouth.  . levothyroxine (SYNTHROID) 150 MCG tablet TAKE 1 TABLET BY MOUTH EVERY DAY BEFORE BREAKFAST  . omeprazole (PRILOSEC) 20 MG capsule Take 20 mg by mouth daily.  . pravastatin (PRAVACHOL) 40 MG tablet TAKE 1 TABLET BY MOUTH EVERY DAY  . vitamin E 100 UNIT capsule Take by mouth daily.  Marland Kitchen. vortioxetine HBr ([name]) 20 MG TABS tablet Take 20 mg by mouth daily.   No current facility-administered medications on file prior to visit.      Allergies:  Allergies  Allergen Reactions  . Lamictal [Lamotrigine] Rash  . Doxycycline Other (See Comments)    GI UPSET  . Effexor [Venlafaxine] Other (See Comments)    DYSPHORIA  . Prednisone Other (See Comments)    FLUSHED  . Savella [Milnacipran Hcl] Other (See Comments)    NO RELIEF     Medical History:  Past Medical History:  Diagnosis Date  . Anemia   . Anxiety   . Constipation   . Depression   . Fibromyalgia   . GERD (gastroesophageal reflux disease)   . Hemorrhoids   . HSV-1 infection   . Hypertension   . Hypothyroidism   . OSA on CPAP   . Other abnormal glucose   . Preeclampsia    Family history- Reviewed and unchanged Social history- Reviewed and unchanged   Review of Systems:  Review of Systems  Constitutional: Negative for chills, fever, malaise/fatigue and weight  loss.  HENT: Negative for ear discharge, ear pain, hearing loss and tinnitus.   Eyes: Negative for blurred vision and double vision.  Respiratory: Negative for cough, shortness of breath and wheezing.   Cardiovascular: Negative for chest pain, palpitations, orthopnea, claudication and leg swelling.  Gastrointestinal: Negative for abdominal pain, blood in stool, constipation, diarrhea, heartburn, melena, nausea and vomiting.  Genitourinary: Negative.   Musculoskeletal: Negative for joint pain and myalgias.  Skin: Negative for rash.  Neurological: Negative for dizziness, tingling, sensory change, weakness and headaches.  Endo/Heme/Allergies: Negative for polydipsia.  Psychiatric/Behavioral: Positive for depression. Negative for substance abuse and suicidal ideas. The patient is nervous/anxious. The patient does not have insomnia.   All other systems reviewed and are negative.    Physical Exam: BP 124/80   Pulse 98   Temp 98.1 F (36.7 C)   Ht 5\' 1"  (1.549 m)   Wt 201 lb (91.2 kg)   LMP 07/05/2019   SpO2 98%   BMI 37.98 kg/m  Wt Readings from Last 3 Encounters:  07/18/19 201 lb (91.2 kg)  06/21/19 200 lb (90.7 kg)  06/18/19 200 lb 6.4 oz (  90.9 kg)   General Appearance: Well nourished, in no apparent distress. Eyes: PERRLA, EOMs, conjunctiva no swelling or erythema Sinuses: No Frontal/maxillary tenderness ENT/Mouth: R Ext aud canals clear, TM without erythema, bulging. L ext aud canal inflamed/narrowed, mildly erythematous, tragus tenderness, no mastoid bogginess, no discharge. No erythema, swelling, or exudate on post pharynx.  Tonsils not swollen or erythematous. Hearing normal.  Neck: Supple, thyroid normal.  Respiratory: Respiratory effort normal, BS equal bilaterally without rales, rhonchi, wheezing or stridor.  Cardio: RRR with no MRGs. Brisk peripheral pulses without edema.  Abdomen: Soft, + BS.  Non tender, no guarding, rebound, hernias, masses. Lymphatics: Non tender  without lymphadenopathy.  Musculoskeletal: Full ROM, 5/5 strength, Normal gait Skin: Warm, dry without rashes, lesions, ecchymosis.  Neuro: Cranial nerves intact. No cerebellar symptoms.  Psych: Awake and oriented X 3, normal affect, Insight and Judgment appropriate.    Vicie Mutters, PA-C 3:51 PM [facility]

## 2019-07-18 ENCOUNTER — Other Ambulatory Visit: Payer: Self-pay

## 2019-07-18 ENCOUNTER — Ambulatory Visit: Payer: BC Managed Care – PPO | Admitting: Physician Assistant

## 2019-07-18 ENCOUNTER — Encounter: Payer: Self-pay | Admitting: Physician Assistant

## 2019-07-18 VITALS — BP 124/80 | HR 98 | Temp 98.1°F | Ht 61.0 in | Wt 201.0 lb

## 2019-07-18 DIAGNOSIS — E559 Vitamin D deficiency, unspecified: Secondary | ICD-10-CM | POA: Diagnosis not present

## 2019-07-18 DIAGNOSIS — E039 Hypothyroidism, unspecified: Secondary | ICD-10-CM

## 2019-07-18 DIAGNOSIS — Z79899 Other long term (current) drug therapy: Secondary | ICD-10-CM

## 2019-07-18 DIAGNOSIS — R7309 Other abnormal glucose: Secondary | ICD-10-CM | POA: Diagnosis not present

## 2019-07-18 DIAGNOSIS — E785 Hyperlipidemia, unspecified: Secondary | ICD-10-CM

## 2019-07-18 DIAGNOSIS — I10 Essential (primary) hypertension: Secondary | ICD-10-CM

## 2019-07-18 NOTE — Patient Instructions (Signed)
Your LDL could improve, ideally we want it under a 100.  Your LDL is the bad cholesterol that can lead to heart attack and stroke. To lower your number you can decrease your fatty foods, red meat, cheese, milk and increase fiber like whole grains and veggies. You can also add a fiber supplement like Citracel or Benefiber, these do not cause gas and bloating and are safe to use. Especially if you have a strong family history of heart disease or stroke or you have evidence of plaque on any imaging like a chest xray, we may discuss at your next office visit putting you on a medication to get your number below 100.  May switch to crestor pending labs  11 Tips to Follow:  1. No caffeine after 3pm: Avoid beverages with caffeine (soda, tea, energy drinks, etc.) especially after 3pm. 2. Don't go to bed hungry: Have your evening meal at least 3 hrs. before going to sleep. It's fine to have a small bedtime snack such as a glass of milk and a few crackers but don't have a big meal. 3. Have a nightly routine before bed: Plan on "winding down" before you go to sleep. Begin relaxing about 1 hour before you go to bed. Try doing a quiet activity such as listening to calming music, reading a book or meditating. 4. Turn off the TV and ALL electronics including video games, tablets, laptops, etc. 1 hour before sleep, and keep them out of the bedroom. 5. Turn off your cell phone and all notifications (new email and text alerts) or even better, leave your phone outside your room while you sleep. Studies have shown that a part of your brain continues to respond to certain lights and sounds even while you're still asleep. 6. Make your bedroom quiet, dark and cool. If you can't control the noise, try wearing earplugs or using a fan to block out other sounds. 7. Practice relaxation techniques. Try reading a book or meditating or drain your brain by writing a list of what you need to do the next day. 8. Don't nap unless you feel  sick: you'll have a better night's sleep. 9. Don't smoke, or quit if you do. Nicotine, alcohol, and marijuana can all keep you awake. Talk to your health care provider if you need help with substance use. 10. Most importantly, wake up at the same time every day (or within 1 hour of your usual wake up time) EVEN on the weekends. A regular wake up time promotes sleep hygiene and prevents sleep problems. 11. Reduce exposure to bright light in the last three hours of the day before going to sleep. Maintaining good sleep hygiene and having good sleep habits lower your risk of developing sleep problems. Getting better sleep can also improve your concentration and alertness. Try the simple steps in this guide. If you still have trouble getting enough rest, make an appointment with your health care provider.

## 2019-07-19 LAB — COMPLETE METABOLIC PANEL WITH GFR
AG Ratio: 1.8 (calc) (ref 1.0–2.5)
ALT: 41 U/L — ABNORMAL HIGH (ref 6–29)
AST: 30 U/L (ref 10–30)
Albumin: 4.6 g/dL (ref 3.6–5.1)
Alkaline phosphatase (APISO): 61 U/L (ref 31–125)
BUN: 8 mg/dL (ref 7–25)
CO2: 25 mmol/L (ref 20–32)
Calcium: 9.6 mg/dL (ref 8.6–10.2)
Chloride: 102 mmol/L (ref 98–110)
Creat: 0.62 mg/dL (ref 0.50–1.10)
GFR, Est African American: 131 mL/min/{1.73_m2} (ref >=60)
GFR, Est Non African American: 113 mL/min/{1.73_m2} (ref >=60)
Globulin: 2.5 g/dL (calc) (ref 1.9–3.7)
Glucose, Bld: 92 mg/dL (ref 65–99)
Potassium: 4 mmol/L (ref 3.5–5.3)
Sodium: 137 mmol/L (ref 135–146)
Total Bilirubin: 0.5 mg/dL (ref 0.2–1.2)
Total Protein: 7.1 g/dL (ref 6.1–8.1)

## 2019-07-19 LAB — VITAMIN D 25 HYDROXY (VIT D DEFICIENCY, FRACTURES): Vit D, 25-Hydroxy: 62 ng/mL (ref 30–100)

## 2019-07-19 LAB — CBC WITH DIFFERENTIAL/PLATELET
Absolute Monocytes: 594 cells/uL (ref 200–950)
Basophils Absolute: 18 cells/uL (ref 0–200)
Basophils Relative: 0.2 %
Eosinophils Absolute: 63 cells/uL (ref 15–500)
Eosinophils Relative: 0.7 %
HCT: 39.8 % (ref 35.0–45.0)
Hemoglobin: 13.3 g/dL (ref 11.7–15.5)
Lymphs Abs: 2970 cells/uL (ref 850–3900)
MCH: 28.5 pg (ref 27.0–33.0)
MCHC: 33.4 g/dL (ref 32.0–36.0)
MCV: 85.4 fL (ref 80.0–100.0)
MPV: 11.4 fL (ref 7.5–12.5)
Monocytes Relative: 6.6 %
Neutro Abs: 5355 cells/uL (ref 1500–7800)
Neutrophils Relative %: 59.5 %
Platelets: 199 10*3/uL (ref 140–400)
RBC: 4.66 10*6/uL (ref 3.80–5.10)
RDW: 13.2 % (ref 11.0–15.0)
Total Lymphocyte: 33 %
WBC: 9 10*3/uL (ref 3.8–10.8)

## 2019-07-19 LAB — HEMOGLOBIN A1C
Hgb A1c MFr Bld: 6.4 % of total Hgb — ABNORMAL HIGH (ref <5.7)
Mean Plasma Glucose: 137 (calc)
eAG (mmol/L): 7.6 (calc)

## 2019-07-19 LAB — LIPID PANEL
Cholesterol: 187 mg/dL (ref <200)
HDL: 48 mg/dL — ABNORMAL LOW (ref >=50)
LDL Cholesterol (Calc): 106 mg/dL (calc) — ABNORMAL HIGH
Non-HDL Cholesterol (Calc): 139 mg/dL (calc) — ABNORMAL HIGH (ref <130)
Total CHOL/HDL Ratio: 3.9 (calc) (ref <5.0)
Triglycerides: 209 mg/dL — ABNORMAL HIGH (ref <150)

## 2019-07-19 LAB — MAGNESIUM: Magnesium: 2 mg/dL (ref 1.5–2.5)

## 2019-07-19 LAB — TSH: TSH: 0.17 mIU/L — ABNORMAL LOW

## 2019-07-22 MED ORDER — ROSUVASTATIN CALCIUM 5 MG PO TABS: 5.0000 mg | ORAL_TABLET | Freq: Every day | ORAL | 3 refills | Status: DC

## 2019-08-22 NOTE — Progress Notes (Deleted)
   Subjective:    Patient ID: Lauren [name], female    DOB: Dec 10, 1980, 38 y.o.   MRN: [id number]  HPI 38 y.o. obese WF with history of anxiety, narcolepsy on stimulant, HTN recently off medications presents with tachycardia.   She had an elevated TSH last visit and her thyroid medication but cut down to 1 pill daily still but 1/2 on Sunday's.  Lab Results  Component Value Date   TSH 0.17 (L) 07/18/2019  No anemia.  Lab Results  Component Value Date   WBC 9.0 07/18/2019   HGB 13.3 07/18/2019   HCT 39.8 07/18/2019   MCV 85.4 07/18/2019   PLT 199 07/18/2019   Medications Current Outpatient Medications on File Prior to Visit  Medication Sig  . ALPRAZolam (XANAX) 0.5 MG tablet Take 0.5 mg by mouth as needed.   Marland Kitchen amphetamine-dextroamphetamine ([name]) 20 MG 24 hr capsule Take 20 mg by mouth daily.  . ARIPiprazole ([name]) 10 MG tablet Take one tablet by mouth daily (Patient taking differently: Take 10 mg by mouth daily. Take one tablet by mouth daily)  . Cholecalciferol (VITAMIN D3) 125 MCG (5000 UT) CAPS Take 1 capsule by mouth daily.  . Ferrous Sulfate 140 (45 Fe) MG TBCR Take 1 tablet by mouth daily.  Marland Kitchen L-Methylfolate-Algae (DEPLIN 15 PO) Take by mouth.  . levothyroxine (SYNTHROID) 150 MCG tablet TAKE 1 TABLET BY MOUTH EVERY DAY BEFORE BREAKFAST  . omeprazole (PRILOSEC) 20 MG capsule Take 20 mg by mouth daily.  . rosuvastatin (CRESTOR) 5 MG tablet Take 1 tablet (5 mg total) by mouth at bedtime.  . vitamin E 100 UNIT capsule Take by mouth daily.  Marland Kitchen vortioxetine HBr ([name]) 20 MG TABS tablet Take 20 mg by mouth daily.   No current facility-administered medications on file prior to visit.     Problem list She has Hypothyroidism; Paralysis, periodic secondary; Depression; Hypertension; Abnormal glucose; Hyperlipidemia LDL goal <100; Morbid obesity ([place]); Medication management; Paraparesis ([place]); Headache associated with sexual activity; Dizziness; Hepatomegaly;  Hepatic steatosis; Recurrent isolated sleep paralysis; Hypersomnia with sleep apnea; Obstructive sleep apnea treated with continuous positive airway pressure (CPAP); Fibromyalgia; Liver lesion, left lobe; Narcolepsy; and Carpal tunnel syndrome, bilateral on their problem list.    Review of Systems     Objective:   Physical Exam        Assessment & Plan:

## 2019-08-26 ENCOUNTER — Ambulatory Visit: Payer: Self-pay | Admitting: Physician Assistant

## 2019-09-16 ENCOUNTER — Telehealth: Payer: Self-pay | Admitting: *Deleted

## 2019-09-16 NOTE — Telephone Encounter (Signed)
Pt has a bump in vaginal area. Advised she needs to have it checked. Pt voiced understanding and call was transferred to front desk for appt. [place]

## 2019-09-16 NOTE — Telephone Encounter (Signed)
Patient states she has a bump in her vaginal that has been there for 3 weeks. She thought it was going away but it is still there.  Wants to know if she needs to be seen.

## 2019-09-18 ENCOUNTER — Ambulatory Visit: Payer: BC Managed Care – PPO | Admitting: Adult Health

## 2019-09-18 ENCOUNTER — Other Ambulatory Visit: Payer: Self-pay

## 2019-09-18 DIAGNOSIS — Z20822 Contact with and (suspected) exposure to covid-19: Secondary | ICD-10-CM

## 2019-09-19 LAB — NOVEL CORONAVIRUS, NAA: SARS-CoV-2, NAA: NOT DETECTED

## 2019-09-27 ENCOUNTER — Other Ambulatory Visit: Payer: Self-pay | Admitting: Adult Health

## 2019-10-02 ENCOUNTER — Ambulatory Visit: Payer: BC Managed Care – PPO | Admitting: Adult Health

## 2019-10-21 ENCOUNTER — Encounter: Payer: Self-pay | Admitting: Physician Assistant

## 2019-10-21 ENCOUNTER — Ambulatory Visit: Payer: BC Managed Care – PPO | Admitting: Physician Assistant

## 2019-10-21 ENCOUNTER — Other Ambulatory Visit: Payer: Self-pay

## 2019-10-21 VITALS — BP 122/90 | HR 78 | Temp 97.7°F | Wt 204.0 lb

## 2019-10-21 DIAGNOSIS — E039 Hypothyroidism, unspecified: Secondary | ICD-10-CM

## 2019-10-21 DIAGNOSIS — E785 Hyperlipidemia, unspecified: Secondary | ICD-10-CM | POA: Diagnosis not present

## 2019-10-21 DIAGNOSIS — I10 Essential (primary) hypertension: Secondary | ICD-10-CM

## 2019-10-21 DIAGNOSIS — R7309 Other abnormal glucose: Secondary | ICD-10-CM

## 2019-10-21 DIAGNOSIS — Z79899 Other long term (current) drug therapy: Secondary | ICD-10-CM | POA: Diagnosis not present

## 2019-10-21 DIAGNOSIS — R0600 Dyspnea, unspecified: Secondary | ICD-10-CM

## 2019-10-21 DIAGNOSIS — E559 Vitamin D deficiency, unspecified: Secondary | ICD-10-CM | POA: Diagnosis not present

## 2019-10-21 DIAGNOSIS — F3341 Major depressive disorder, recurrent, in partial remission: Secondary | ICD-10-CM

## 2019-10-21 DIAGNOSIS — R0609 Other forms of dyspnea: Secondary | ICD-10-CM

## 2019-10-21 MED ORDER — ALPRAZOLAM 0.5 MG PO TABS: 0.5000 mg | ORAL_TABLET | Freq: Every evening | ORAL | 0 refills | Status: DC | PRN

## 2019-10-21 MED ORDER — OMEPRAZOLE 40 MG PO CPDR: 40.0000 mg | DELAYED_RELEASE_CAPSULE | Freq: Every day | ORAL | 1 refills | Status: DC

## 2019-10-21 NOTE — Progress Notes (Signed)
FOLLOW UP  Assessment and Plan:   Dyspnea on exertion X 2 weeks She denies cough, wheezing She has some left upper thigh pain that is new but normal exam, no redness, warmth, swelling EKG WNL no ST changes, low risk with age- however has DM, obesity, and hyperlipidemia may refer if work up negative or if continues to have issues for holter/calcium coronary score/POST Will get CXR and get ddimer to rule out DVT/PE with dyspnea, left thigh pain, and occ palpitations.  ? Anxiety/GERD- check TSH, get on PPI that she is suppose to be on, and xanax given to take AS needed, follow up psych. -     EKG 12-Lead -     DG Chest 2 View; Future -     D-dimer, quantitative -     omeprazole (PRILOSEC) 40 MG capsule; Take 1 capsule (40 mg total) by mouth daily. -     ALPRAZolam (XANAX) 0.5 MG tablet; Take 1 tablet (0.5 mg total) by mouth at bedtime as needed.  Hypertension Well controlled off of medications at this time Monitor blood pressure at home; patient to call if consistently greater than 130/80  Continue DASH diet.   Reminder to go to the ER if any CP, SOB, nausea, dizziness, severe HA, changes vision/speech, left arm numbness and tingling and jaw pain.  Cholesterol Currently at LDL not goal; continue pravastatin; diet for trigs discussed, suggested omega 3 supplement Continue low cholesterol diet and exercise.  Check lipid panel.  May switch pravastatin to crestor to reach goal  Prediabetes Continue diet and exercise.  Perform daily foot/skin check, notify office of any concerning changes.  Check A1C  Hypothyroidism continue medications the same pending lab results reminded to take on an empty stomach 30-45mins before food.  check TSH level  Morbid Obesity with co morbidities Long discussion about weight loss, diet, and exercise Recommended diet heavy in fruits and veggies and low in animal meats, cheeses, and dairy products, appropriate calorie intake Discussed ideal weight for  height  Will follow up in 3 months  Vitamin D Def Below goal at last visit;  continue supplementation to maintain goal of 70-100 Check Vit D level  Depression/anxiety with sleep disturbance discussed good sleep habits Continue medications; follow up with psych  Lifestyle discussed: diet/exerise, sleep hygiene, stress management, hydration   Continue diet and meds as discussed. Further disposition pending results of labs. Discussed med's effects and SE's.   Over 30 minutes of exam, counseling, chart review, and critical decision making was performed.   Future Appointments  Date Time Provider Department Center  03/04/2020  3:30 PM Shawnie Dapper, NP [facility] None  04/15/2020  3:00 PM Quentin Mulling, PA-C [facility] None    ----------------------------------------------------------------------------------------------------------------------  HPI 38 y.o. female  presents for 3 month follow up on hypertension, cholesterol, prediabetes, morbid obesity, hypothyroidism and vitamin D deficiency.   She has fibromyalgia (reports diagnosed several years ago ~8 years), has seen rheumatology Dr. Corliss Skains remotely. She has hx of OSA on CPAP, had extensive problems with hypersomnia, following with neuro and on adderall for narcolepsy.   She has been extra tired recently, she has had some SOB with walking and wearing her mask which is new. Which started about 2 weeks ago.  She has had some chills, no fever. She has not had any wheezing, coughing. She has had some chest discomfort with deep breathing, states feels like "asthma" but she has never had it. Has had some palpitations on and off, does feel anxious.  She had woke up once in the night feeling short of breath She has had some cramping in her left upper leg, no swelling, redness or warmth.  She does not have any xanax, she would like to have some.   BMI is Body mass index is 38.55 kg/m., she has been working on diet and exercise. Wt Readings  from Last 3 Encounters:  10/21/19 204 lb (92.5 kg)  07/18/19 201 lb (91.2 kg)  06/21/19 200 lb (90.7 kg)   Her blood pressure has been controlled at home, today their BP is BP: 122/90  She does not workout. She denies chest pain, shortness of breath, dizziness.   She is on cholesterol medication Pravastatin 40 mg daily and denies myalgias. Her LDL cholesterol is not at goal; trigs are notably elevated. The cholesterol last visit was:   Lab Results  Component Value Date   CHOL 187 07/18/2019   HDL 48 (L) 07/18/2019   LDLCALC 106 (H) 07/18/2019   LDLDIRECT 114.9 04/30/2012   TRIG 209 (H) 07/18/2019   CHOLHDL 3.9 07/18/2019    She has been working on diet and exercise for prediabetes, and denies increased appetite, nausea, paresthesia of the feet, polydipsia, polyuria and visual disturbances. Last A1C in the office was:  Lab Results  Component Value Date   HGBA1C 6.4 (H) 07/18/2019   She is on thyroid medication. Her medication was changed last visit.  Currently taking 1 tab DAILY rather than 1/2 on Sunday Lab Results  Component Value Date   TSH 0.17 (L) 07/18/2019   Patient is on Vitamin D supplement.   Lab Results  Component Value Date   VD25OH 62 07/18/2019        Current Medications:  Current Outpatient Medications on File Prior to Visit  Medication Sig  . ALPRAZolam (XANAX) 0.5 MG tablet Take 0.5 mg by mouth as needed.   Marland Kitchen amphetamine-dextroamphetamine ([name]) 20 MG 24 hr capsule Take 20 mg by mouth daily.  . ARIPiprazole ([name]) 10 MG tablet Take one tablet by mouth daily (Patient taking differently: Take 10 mg by mouth daily. Take one tablet by mouth daily)  . Cholecalciferol (VITAMIN D3) 125 MCG (5000 UT) CAPS Take 1 capsule by mouth daily.  . Ferrous Sulfate 140 (45 Fe) MG TBCR Take 1 tablet by mouth daily.  Marland Kitchen L-Methylfolate-Algae (DEPLIN 15 PO) Take by mouth.  . levothyroxine (SYNTHROID) 150 MCG tablet TAKE 1 TABLET BY MOUTH EVERY DAY BEFORE BREAKFAST  .  omeprazole (PRILOSEC) 20 MG capsule Take 20 mg by mouth daily.  . pravastatin (PRAVACHOL) 40 MG tablet Take 1 tablet at Bedtime for Cholesterol  . rosuvastatin (CRESTOR) 5 MG tablet Take 1 tablet (5 mg total) by mouth at bedtime.  . vitamin E 100 UNIT capsule Take by mouth daily.  Marland Kitchen vortioxetine HBr ([name]) 20 MG TABS tablet Take 20 mg by mouth daily.   No current facility-administered medications on file prior to visit.      Allergies:  Allergies  Allergen Reactions  . Lamictal [Lamotrigine] Rash  . Doxycycline Other (See Comments)    GI UPSET  . Effexor [Venlafaxine] Other (See Comments)    DYSPHORIA  . Prednisone Other (See Comments)    FLUSHED  . Savella [Milnacipran Hcl] Other (See Comments)    NO RELIEF     Medical History:  Past Medical History:  Diagnosis Date  . Anemia   . Anxiety   . Constipation   . Depression   . Fibromyalgia   .  GERD (gastroesophageal reflux disease)   . Hemorrhoids   . HSV-1 infection   . Hypertension   . Hypothyroidism   . OSA on CPAP   . Other abnormal glucose   . Preeclampsia    Family history- Reviewed and unchanged Social history- Reviewed and unchanged   Review of Systems:  Review of Systems  Constitutional: Positive for malaise/fatigue. Negative for chills, fever and weight loss.  HENT: Negative for ear discharge, ear pain, hearing loss and tinnitus.   Eyes: Negative for blurred vision and double vision.  Respiratory: Positive for shortness of breath. Negative for cough, hemoptysis, sputum production and wheezing.   Cardiovascular: Positive for palpitations. Negative for chest pain, orthopnea, claudication and leg swelling.  Gastrointestinal: Negative for abdominal pain, blood in stool, constipation, diarrhea, heartburn, melena, nausea and vomiting.  Genitourinary: Negative.   Musculoskeletal: Negative for joint pain and myalgias.  Skin: Negative for rash.  Neurological: Negative for dizziness, tingling, sensory  change, weakness and headaches.  Endo/Heme/Allergies: Negative for polydipsia.  Psychiatric/Behavioral: Positive for depression. Negative for substance abuse and suicidal ideas. The patient is nervous/anxious. The patient does not have insomnia.   All other systems reviewed and are negative.    Physical Exam: BP 122/90   Pulse 78   Temp 97.7 F (36.5 C)   Wt 204 lb (92.5 kg)   BMI 38.55 kg/m  Wt Readings from Last 3 Encounters:  10/21/19 204 lb (92.5 kg)  07/18/19 201 lb (91.2 kg)  06/21/19 200 lb (90.7 kg)   General Appearance: Well nourished, in no apparent distress. Eyes: PERRLA, EOMs, conjunctiva no swelling or erythema Sinuses: No Frontal/maxillary tenderness ENT/Mouth: R Ext aud canals clear, TM without erythema, bulging. L ext aud canal inflamed/narrowed, mildly erythematous, tragus tenderness, no mastoid bogginess, no discharge. No erythema, swelling, or exudate on post pharynx.  Tonsils not swollen or erythematous. Hearing normal.  Neck: Supple, thyroid normal.  Respiratory: Respiratory effort normal, BS equal bilaterally without rales, rhonchi, wheezing or stridor.  Cardio: RRR with no MRGs. Brisk peripheral pulses without edema.  Abdomen: Soft, + BS.  Non tender, no guarding, rebound, hernias, masses. Lymphatics: Non tender without lymphadenopathy.  Musculoskeletal: Full ROM, 5/5 strength, Normal gait Skin: Warm, dry without rashes, lesions, ecchymosis.  Neuro: Cranial nerves intact. No cerebellar symptoms.  Psych: Awake and oriented X 3, normal affect, Insight and Judgment appropriate.    Quentin MullingAmanda Janisa Labus, PA-C 4:39 PM [facility]

## 2019-10-21 NOTE — Patient Instructions (Signed)
Go to the ER if any chest pain, shortness of breath, nausea, dizziness, severe HA, changes vision/speech  Get back on stomach medication, will send in for you.   INFORMATION ABOUT YOUR XRAY  Can walk into [address] for an Insurance account manager. They will have the order and take you back. You do not any paper work, I should get the result back today or tomorrow. This order is good for a year.  Can call [phone] to schedule an appointment if you wish.     Silent reflux: Not all heartburn burns...Marland KitchenMarland KitchenMarland Kitchen  What is LPR? Laryngopharyngeal reflux (LPR) or silent reflux is a condition in which acid that is made in the stomach travels up the esophagus (swallowing tube) and gets to the throat. Not everyone with reflux has a lot of heartburn or indigestion. In fact, many people with LPR never have heartburn. This is why LPR is called SILENT REFLUX, and the terms "Silent reflux" and "LPR" are often used interchangeably. Because LPR is silent, it is sometimes difficult to diagnose.  How can you tell if you have LPR?   Chronic hoarseness- Some people have hoarseness that comes and goes  throat clearing   Cough  It can cause shortness of breath and cause asthma like symptoms.  a feeling of a lump in the throat   difficulty swallowing  a problem with too much nose and throat drainage.   Some people will feel their esophagus spasm which feels like their heart beating hard and fast, this will usually be after a meal, at rest, or lying down at night.    How do I treat this? Treatment for LPR should be individualized, and your doctor will suggest the best treatment for you. Generally there are several treatments for LPR:  changing habits and diet to reduce reflux,   medications to reduce stomach acid, and   surgery to prevent reflux. Most people with LPR need to modify how and when they eat, as well as take some medication, to get well. Sometimes, nonprescription liquid antacids, such as  Maalox, Gelucil and Mylanta are recommended. When used, these antacids should be taken four times each day - one tablespoon one hour after each meal and before bedtime. Dietary and lifestyle changes alone are not often enough to control LPR - medications that reduce stomach acid are also usually needed. These must be prescribed by our doctor.   TIPS FOR REDUCING REFLUX AND LPR Control your LIFE-STYLE and your DIET!  If you use tobacco, QUIT.   Smoking makes you reflux. After every cigarette you have some LPR.   Don't wear clothing that is too tight, especially around the waist (trousers, corsets, belts).   Do not lie down just after eating...in fact, do not eat within three hours of bedtime.   You should be on a low-fat diet.   Limit your intake of red meat.   Limit your intake of butter.   Avoid fried foods.   Avoid chocolate   Avoid cheese.   Avoid eggs.  Specifically avoid caffeine (especially coffee and tea), soda pop (especially cola) and mints.   Avoid alcoholic beverages, particularly in the evening.  Shortness of Breath, Adult Shortness of breath is when a person has trouble breathing enough air or when a person feels like she or he is having trouble breathing in enough air. Shortness of breath could be a sign of a medical problem. Follow these instructions at home:   Pay attention to any changes in your  symptoms.  Do not use any products that contain nicotine or tobacco, such as cigarettes, e-cigarettes, and chewing tobacco.  Do not smoke. Smoking is a common cause of shortness of breath. If you need help quitting, ask your health care provider.  Avoid things that can irritate your airways, such as: ? Mold. ? Dust. ? Air pollution. ? Chemical fumes. ? Things that can cause allergy symptoms (allergens), if you have allergies.  Keep your living space clean and free of mold and dust.  Rest as needed. Slowly return to your usual activities.  Take  over-the-counter and prescription medicines only as told by your health care provider. This includes oxygen therapy and inhaled medicines.  Keep all follow-up visits as told by your health care provider. This is important. Contact a health care provider if:  Your condition does not improve as soon as expected.  You have a hard time doing your normal activities, even after you rest.  You have new symptoms. Get help right away if:  Your shortness of breath gets worse.  You have shortness of breath when you are resting.  You feel light-headed or you faint.  You have a cough that is not controlled with medicines.  You cough up blood.  You have pain with breathing.  You have pain in your chest, arms, shoulders, or abdomen.  You have a fever.  You cannot walk up stairs or exercise the way that you normally do. These symptoms may represent a serious problem that is an emergency. Do not wait to see if the symptoms will go away. Get medical help right away. Call your local emergency services (911 in the [place] Do not drive yourself to the hospital. Summary  Shortness of breath is when a person has trouble breathing enough air. It can be a sign of a medical problem.  Avoid things that irritate your lungs, such as smoking, pollution, mold, and dust.  Pay attention to changes in your symptoms and contact your health care provider if you have a hard time completing daily activities because of shortness of breath. This information is not intended to replace advice given to you by your health care provider. Make sure you discuss any questions you have with your health care provider. Document Released: 08/02/2001 Document Revised: 04/09/2018 Document Reviewed: 04/09/2018 Elsevier Patient Education  2020 [employer].

## 2019-10-22 ENCOUNTER — Telehealth (HOSPITAL_COMMUNITY): Payer: Self-pay | Admitting: *Deleted

## 2019-10-22 ENCOUNTER — Ambulatory Visit (HOSPITAL_COMMUNITY)
Admission: RE | Admit: 2019-10-22 | Discharge: 2019-10-22 | Disposition: A | Discharge status: Home / Self Care | Payer: BC Managed Care – PPO | Source: Ambulatory Visit | Attending: Family | Admitting: Family

## 2019-10-22 ENCOUNTER — Other Ambulatory Visit: Payer: Self-pay | Admitting: Physician Assistant

## 2019-10-22 ENCOUNTER — Telehealth: Payer: Self-pay

## 2019-10-22 DIAGNOSIS — R7989 Other specified abnormal findings of blood chemistry: Secondary | ICD-10-CM | POA: Insufficient documentation

## 2019-10-22 DIAGNOSIS — M79605 Pain in left leg: Secondary | ICD-10-CM

## 2019-10-22 LAB — COMPLETE METABOLIC PANEL WITH GFR
AG Ratio: 1.6 (calc) (ref 1.0–2.5)
ALT: 27 U/L (ref 6–29)
AST: 28 U/L (ref 10–30)
Albumin: 4.5 g/dL (ref 3.6–5.1)
Alkaline phosphatase (APISO): 56 U/L (ref 31–125)
BUN: 11 mg/dL (ref 7–25)
CO2: 25 mmol/L (ref 20–32)
Calcium: 9.5 mg/dL (ref 8.6–10.2)
Chloride: 105 mmol/L (ref 98–110)
Creat: 0.57 mg/dL (ref 0.50–1.10)
GFR, Est African American: 134 mL/min/{1.73_m2} (ref >=60)
GFR, Est Non African American: 116 mL/min/{1.73_m2} (ref >=60)
Globulin: 2.8 g/dL (calc) (ref 1.9–3.7)
Glucose, Bld: 84 mg/dL (ref 65–99)
Potassium: 4 mmol/L (ref 3.5–5.3)
Sodium: 140 mmol/L (ref 135–146)
Total Bilirubin: 0.4 mg/dL (ref 0.2–1.2)
Total Protein: 7.3 g/dL (ref 6.1–8.1)

## 2019-10-22 LAB — LIPID PANEL
Cholesterol: 196 mg/dL (ref <200)
HDL: 48 mg/dL — ABNORMAL LOW (ref >=50)
LDL Cholesterol (Calc): 115 mg/dL (calc) — ABNORMAL HIGH
Non-HDL Cholesterol (Calc): 148 mg/dL (calc) — ABNORMAL HIGH (ref <130)
Total CHOL/HDL Ratio: 4.1 (calc) (ref <5.0)
Triglycerides: 212 mg/dL — ABNORMAL HIGH (ref <150)

## 2019-10-22 LAB — CBC WITH DIFFERENTIAL/PLATELET
Absolute Monocytes: 551 cells/uL (ref 200–950)
Basophils Absolute: 32 cells/uL (ref 0–200)
Basophils Relative: 0.4 %
Eosinophils Absolute: 97 cells/uL (ref 15–500)
Eosinophils Relative: 1.2 %
HCT: 40 % (ref 35.0–45.0)
Hemoglobin: 13.3 g/dL (ref 11.7–15.5)
Lymphs Abs: 2892 cells/uL (ref 850–3900)
MCH: 28.2 pg (ref 27.0–33.0)
MCHC: 33.3 g/dL (ref 32.0–36.0)
MCV: 84.9 fL (ref 80.0–100.0)
MPV: 11.9 fL (ref 7.5–12.5)
Monocytes Relative: 6.8 %
Neutro Abs: 4528 cells/uL (ref 1500–7800)
Neutrophils Relative %: 55.9 %
Platelets: 183 10*3/uL (ref 140–400)
RBC: 4.71 10*6/uL (ref 3.80–5.10)
RDW: 12.8 % (ref 11.0–15.0)
Total Lymphocyte: 35.7 %
WBC: 8.1 10*3/uL (ref 3.8–10.8)

## 2019-10-22 LAB — HEMOGLOBIN A1C
Hgb A1c MFr Bld: 6.5 % of total Hgb — ABNORMAL HIGH (ref <5.7)
Mean Plasma Glucose: 140 (calc)
eAG (mmol/L): 7.7 (calc)

## 2019-10-22 LAB — MAGNESIUM: Magnesium: 2.1 mg/dL (ref 1.5–2.5)

## 2019-10-22 LAB — VITAMIN D 25 HYDROXY (VIT D DEFICIENCY, FRACTURES): Vit D, 25-Hydroxy: 48 ng/mL (ref 30–100)

## 2019-10-22 LAB — TSH: TSH: 0.65 mIU/L

## 2019-10-22 LAB — D-DIMER, QUANTITATIVE: D-Dimer, Quant: 0.99 mcg/mL FEU — ABNORMAL HIGH (ref <0.50)

## 2019-10-22 NOTE — Telephone Encounter (Signed)
The above patient or their representative was contacted and gave the following answers to these questions:         Do you have any of the following symptoms?    NO  Fever                    Cough                   Shortness of breath  Do  you have any of the following other symptoms?    muscle pain         vomiting,        diarrhea        rash         weakness        red eye        abdominal pain         bruising          bruising or bleeding              joint pain           severe headache    Have you been in contact with someone who was or has been sick in the past 2 weeks?  NO  Yes                 Unsure                         Unable to assess   Does the person that you were in contact with have any of the following symptoms?   Cough         shortness of breath           muscle pain         vomiting,            diarrhea            rash            weakness           fever            red eye           abdominal pain           bruising  or  bleeding                joint pain                severe headache                 COMMENTS OR ACTION PLAN FOR THIS PATIENT:   10/22/19 no to all question

## 2019-10-22 NOTE — Telephone Encounter (Signed)
Vascular center called to let you know that there wasn't any evidence of a blood clot in her left leg.

## 2019-10-23 ENCOUNTER — Ambulatory Visit: Payer: BC Managed Care – PPO | Admitting: Physician Assistant

## 2019-11-05 ENCOUNTER — Encounter: Payer: Self-pay | Admitting: Adult Health Nurse Practitioner

## 2019-11-05 ENCOUNTER — Other Ambulatory Visit: Payer: Self-pay

## 2019-11-05 ENCOUNTER — Ambulatory Visit: Payer: BC Managed Care – PPO | Admitting: Adult Health Nurse Practitioner

## 2019-11-05 VITALS — BP 124/80 | HR 97 | Temp 98.1°F | Wt 201.0 lb

## 2019-11-05 DIAGNOSIS — Z79899 Other long term (current) drug therapy: Secondary | ICD-10-CM | POA: Diagnosis not present

## 2019-11-05 DIAGNOSIS — G47419 Narcolepsy without cataplexy: Secondary | ICD-10-CM

## 2019-11-05 DIAGNOSIS — Z9989 Dependence on other enabling machines and devices: Secondary | ICD-10-CM

## 2019-11-05 DIAGNOSIS — R7309 Other abnormal glucose: Secondary | ICD-10-CM

## 2019-11-05 DIAGNOSIS — R5381 Other malaise: Secondary | ICD-10-CM

## 2019-11-05 DIAGNOSIS — E559 Vitamin D deficiency, unspecified: Secondary | ICD-10-CM

## 2019-11-05 DIAGNOSIS — E785 Hyperlipidemia, unspecified: Secondary | ICD-10-CM | POA: Diagnosis not present

## 2019-11-05 DIAGNOSIS — I10 Essential (primary) hypertension: Secondary | ICD-10-CM | POA: Diagnosis not present

## 2019-11-05 DIAGNOSIS — D509 Iron deficiency anemia, unspecified: Secondary | ICD-10-CM

## 2019-11-05 DIAGNOSIS — R0609 Other forms of dyspnea: Secondary | ICD-10-CM

## 2019-11-05 DIAGNOSIS — G4733 Obstructive sleep apnea (adult) (pediatric): Secondary | ICD-10-CM

## 2019-11-05 DIAGNOSIS — R0600 Dyspnea, unspecified: Secondary | ICD-10-CM

## 2019-11-05 DIAGNOSIS — K2100 Gastro-esophageal reflux disease with esophagitis, without bleeding: Secondary | ICD-10-CM

## 2019-11-05 DIAGNOSIS — R5383 Other fatigue: Secondary | ICD-10-CM

## 2019-11-05 DIAGNOSIS — F3341 Major depressive disorder, recurrent, in partial remission: Secondary | ICD-10-CM

## 2019-11-05 DIAGNOSIS — M797 Fibromyalgia: Secondary | ICD-10-CM

## 2019-11-05 DIAGNOSIS — E039 Hypothyroidism, unspecified: Secondary | ICD-10-CM

## 2019-11-05 NOTE — Patient Instructions (Addendum)
We will respond via MyChart in 1-3 days labs results.  I did not check your Vitamin D level today, it was low 48 last check.  Consider trying Pepsid (famotadine) take one tablet daily.  Do this IF you are taking TUMS on a regular basis.  Continue to monitor your shortness of breath.    Please seek immediate attention in the emergency room if you have chest pains, change in your shortness of breath or chest pains.  Continue taking Zyrtec daily and monitor rash.    Vit D  & Vit C 1,000 mg   are recommended to help protect  against the Covid-19 and other Corona viruses.    Also it's recommended  to take  Zinc 50 mg  to help  protect against the Covid-19   and best place to get  is also on [email or web address]  and don't pay more than 6-8 cents /pill !  ================================ Coronavirus (COVID-19) Are you at risk?  Are you at risk for the Coronavirus (COVID-19)?  To be considered HIGH RISK for Coronavirus (COVID-19), you have to meet the following criteria:  . Traveled to [place], [place], [place], [place] or [place]; or in the [place] to [place], [place], [place]  . or [place]; and have fever, cough, and shortness of breath within the last 2 weeks of travel OR . Been in close contact with a person diagnosed with COVID-19 within the last 2 weeks and have  . fever, cough,and shortness of breath .  . IF YOU DO NOT MEET THESE CRITERIA, YOU ARE CONSIDERED LOW RISK FOR COVID-19.  What to do if you are HIGH RISK for COVID-19?  Marland Kitchen If you are having a medical emergency, call 911. . Seek medical care right away. Before you go to a doctor's office, urgent care or emergency department, .  call ahead and tell them about your recent travel, contact with someone diagnosed with COVID-19  .  and your symptoms.  . You should receive instructions from your physician's office regarding next steps of care.  . When you arrive at healthcare provider, tell the healthcare staff  immediately you have returned from  . visiting [place], [place], [place], [place] or [place]; or traveled in the [place] to [place], [place],  . [place] or [place] in the last two weeks or you have been in close contact with a person diagnosed with  . COVID-19 in the last 2 weeks.   . Tell the health care staff about your symptoms: fever, cough and shortness of breath. . After you have been seen by a medical provider, you will be either: o Tested for (COVID-19) and discharged home on quarantine except to seek medical care if  o symptoms worsen, and asked to  - Stay home and avoid contact with others until you get your results (4-5 days)  - Avoid travel on public transportation if possible (such as bus, train, or airplane) or o Sent to the Emergency Department by EMS for evaluation, COVID-19 testing  and  o possible admission depending on your condition and test results.  What to do if you are LOW RISK for COVID-19?  Reduce your risk of any infection by using the same precautions used for avoiding the common cold or flu:  Marland Kitchen Wash your hands often with soap and warm water for at least 20 seconds.  If soap and water are not readily available,  . use an alcohol-based hand sanitizer with at least 60%  alcohol.  . If coughing or sneezing, cover your mouth and nose by coughing or sneezing into the elbow areas of your shirt or coat, .  into a tissue or into your sleeve (not your hands). . Avoid shaking hands with others and consider head nods or verbal greetings only. . Avoid touching your eyes, nose, or mouth with unwashed hands.  . Avoid close contact with people who are sick. . Avoid places or events with large numbers of people in one location, like concerts or sporting events. . Carefully consider travel plans you have or are making. . If you are planning any travel outside or inside the [place], visit the CDC's Travelers' Health webpage for the latest health notices. . If you have some  symptoms but not all symptoms, continue to monitor at home and seek medical attention  . if your symptoms worsen. . If you are having a medical emergency, call 911. >>>>>>>>>>>>>>>>>>>>>>> Preventive Care for Adults  A healthy lifestyle and preventive care can promote health and wellness. Preventive health guidelines for women include the following key practices.  A routine yearly physical is a good way to check with your health care provider about your health and preventive screening. It is a chance to share any concerns and updates on your health and to receive a thorough exam.  Visit your dentist for a routine exam and preventive care every 6 months. Brush your teeth twice a day and floss once a day. Good oral hygiene prevents tooth decay and gum disease.  The frequency of eye exams is based on your age, health, family medical history, use of contact lenses, and other factors. Follow your health care provider's recommendations for frequency of eye exams.  Eat a healthy diet. Foods like vegetables, fruits, whole grains, low-fat dairy products, and lean protein foods contain the nutrients you need without too many calories. Decrease your intake of foods high in solid fats, added sugars, and salt. Eat the right amount of calories for you. Get information about a proper diet from your health care provider, if necessary.  Regular physical exercise is one of the most important things you can do for your health. Most adults should get at least 150 minutes of moderate-intensity exercise (any activity that increases your heart rate and causes you to sweat) each week. In addition, most adults need muscle-strengthening exercises on 2 or more days a week.  Maintain a healthy weight. The body mass index (BMI) is a screening tool to identify possible weight problems. It provides an estimate of body fat based on height and weight. Your health care provider can find your BMI and can help you achieve or maintain  a healthy weight. For adults 20 years and older:  A BMI below 18.5 is considered underweight.  A BMI of 18.5 to 24.9 is normal.  A BMI of 25 to 29.9 is considered overweight.  A BMI of 30 and above is considered obese.  Maintain normal blood lipids and cholesterol levels by exercising and minimizing your intake of saturated fat. Eat a balanced diet with plenty of fruit and vegetables. Blood tests for lipids and cholesterol should begin at age 64 and be repeated every 5 years. If your lipid or cholesterol levels are high, you are over 50, or you are at high risk for heart disease, you may need your cholesterol levels checked more frequently. Ongoing high lipid and cholesterol levels should be treated with medicines if diet and exercise are not working.  If you smoke,  find out from your health care provider how to quit. If you do not use tobacco, do not start.  Lung cancer screening is recommended for adults aged 83-80 years who are at high risk for developing lung cancer because of a history of smoking. A yearly low-dose CT scan of the lungs is recommended for people who have at least a 30-pack-year history of smoking and are a current smoker or have quit within the past 15 years. A pack year of smoking is smoking an average of 1 pack of cigarettes a day for 1 year (for example: 1 pack a day for 30 years or 2 packs a day for 15 years). Yearly screening should continue until the smoker has stopped smoking for at least 15 years. Yearly screening should be stopped for people who develop a health problem that would prevent them from having lung cancer treatment.  High blood pressure causes heart disease and increases the risk of stroke. Your blood pressure should be checked at least every 1 to 2 years. Ongoing high blood pressure should be treated with medicines if weight loss and exercise do not work.  If you are 45-22 years old, ask your health care provider if you should take aspirin to prevent  strokes.  Diabetes screening involves taking a blood sample to check your fasting blood sugar level. This should be done once every 3 years, after age 8, if you are within normal weight and without risk factors for diabetes. Testing should be considered at a younger age or be carried out more frequently if you are overweight and have at least 1 risk factor for diabetes.  Breast cancer screening is essential preventive care for women. You should practice "breast self-awareness." This means understanding the normal appearance and feel of your breasts and may include breast self-examination. Any changes detected, no matter how small, should be reported to a health care provider. Women in their 25s and 30s should have a clinical breast exam (CBE) by a health care provider as part of a regular health exam every 1 to 3 years. After age 63, women should have a CBE every year. Starting at age 4, women should consider having a mammogram (breast X-ray test) every year. Women who have a family history of breast cancer should talk to their health care provider about genetic screening. Women at a high risk of breast cancer should talk to their health care providers about having an MRI and a mammogram every year.  Breast cancer gene (BRCA)-related cancer risk assessment is recommended for women who have family members with BRCA-related cancers. BRCA-related cancers include breast, ovarian, tubal, and peritoneal cancers. Having family members with these cancers may be associated with an increased risk for harmful changes (mutations) in the breast cancer genes BRCA1 and BRCA2. Results of the assessment will determine the need for genetic counseling and BRCA1 and BRCA2 testing.  Routine pelvic exams to screen for cancer are no longer recommended for nonpregnant women who are considered low risk for cancer of the pelvic organs (ovaries, uterus, and vagina) and who do not have symptoms. Ask your health care provider if a  screening pelvic exam is right for you.  If you have had past treatment for cervical cancer or a condition that could lead to cancer, you need Pap tests and screening for cancer for at least 20 years after your treatment. If Pap tests have been discontinued, your risk factors (such as having a new sexual partner) need to be reassessed to determine  if screening should be resumed. Some women have medical problems that increase the chance of getting cervical cancer. In these cases, your health care provider may recommend more frequent screening and Pap tests.  Colorectal cancer can be detected and often prevented. Most routine colorectal cancer screening begins at the age of 42 years and continues through age 48 years. However, your health care provider may recommend screening at an earlier age if you have risk factors for colon cancer. On a yearly basis, your health care provider may provide home test kits to check for hidden blood in the stool. Use of a small camera at the end of a tube, to directly examine the colon (sigmoidoscopy or colonoscopy), can detect the earliest forms of colorectal cancer. Talk to your health care provider about this at age 78, when routine screening begins.  Direct exam of the colon should be repeated every 5-10 years through age 89 years, unless early forms of pre-cancerous polyps or small growths are found.  Hepatitis C blood testing is recommended for all people born from [birth year] through 1965 and any individual with known risks for hepatitis C.  Pra  Osteoporosis is a disease in which the bones lose minerals and strength with aging. This can result in serious bone fractures or breaks. The risk of osteoporosis can be identified using a bone density scan. Women ages 49 years and over and women at risk for fractures or osteoporosis should discuss screening with their health care providers. Ask your health care provider whether you should take a calcium supplement or vitamin D to  reduce the rate of osteoporosis.  Menopause can be associated with physical symptoms and risks. Hormone replacement therapy is available to decrease symptoms and risks. You should talk to your health care provider about whether hormone replacement therapy is right for you.  Use sunscreen. Apply sunscreen liberally and repeatedly throughout the day. You should seek shade when your shadow is shorter than you. Protect yourself by wearing long sleeves, pants, a wide-brimmed hat, and sunglasses year round, whenever you are outdoors.  Once a month, do a whole body skin exam, using a mirror to look at the skin on your back. Tell your health care provider of new moles, moles that have irregular borders, moles that are larger than a pencil eraser, or moles that have changed in shape or color.  Stay current with required vaccines (immunizations).  Influenza vaccine. All adults should be immunized every year.  Tetanus, diphtheria, and acellular pertussis (Td, Tdap) vaccine. Pregnant women should receive 1 dose of Tdap vaccine during each pregnancy. The dose should be obtained regardless of the length of time since the last dose. Immunization is preferred during the 27th-36th week of gestation. An adult who has not previously received Tdap or who does not know her vaccine status should receive 1 dose of Tdap. This initial dose should be followed by tetanus and diphtheria toxoids (Td) booster doses every 10 years. Adults with an unknown or incomplete history of completing a 3-dose immunization series with Td-containing vaccines should begin or complete a primary immunization series including a Tdap dose. Adults should receive a Td booster every 10 years.  Varicella vaccine. An adult without evidence of immunity to varicella should receive 2 doses or a second dose if she has previously received 1 dose. Pregnant females who do not have evidence of immunity should receive the first dose after pregnancy. This first  dose should be obtained before leaving the health care facility. The second dose  should be obtained 4-8 weeks after the first dose.  Human papillomavirus (HPV) vaccine. Females aged 13-26 years who have not received the vaccine previously should obtain the 3-dose series. The vaccine is not recommended for use in pregnant females. However, pregnancy testing is not needed before receiving a dose. If a female is found to be pregnant after receiving a dose, no treatment is needed. In that case, the remaining doses should be delayed until after the pregnancy. Immunization is recommended for any person with an immunocompromised condition through the age of 21 years if she did not get any or all doses earlier. During the 3-dose series, the second dose should be obtained 4-8 weeks after the first dose. The third dose should be obtained 24 weeks after the first dose and 16 weeks after the second dose.  Zoster vaccine. One dose is recommended for adults aged 68 years or older unless certain conditions are present.  Measles, mumps, and rubella (MMR) vaccine. Adults born before [birth year] generally are considered immune to measles and mumps. Adults born in [birth year] or later should have 1 or more doses of MMR vaccine unless there is a contraindication to the vaccine or there is laboratory evidence of immunity to each of the three diseases. A routine second dose of MMR vaccine should be obtained at least 28 days after the first dose for students attending postsecondary schools, health care workers, or international travelers. People who received inactivated measles vaccine or an unknown type of measles vaccine during 1963-1967 should receive 2 doses of MMR vaccine. People who received inactivated mumps vaccine or an unknown type of mumps vaccine before 1979 and are at high risk for mumps infection should consider immunization with 2 doses of MMR vaccine. For females of childbearing age, rubella immunity should be determined. If there  is no evidence of immunity, females who are not pregnant should be vaccinated. If there is no evidence of immunity, females who are pregnant should delay immunization until after pregnancy. Unvaccinated health care workers born before [birth year] who lack laboratory evidence of measles, mumps, or rubella immunity or laboratory confirmation of disease should consider measles and mumps immunization with 2 doses of MMR vaccine or rubella immunization with 1 dose of MMR vaccine.  Pneumococcal 13-valent conjugate (PCV13) vaccine. When indicated, a person who is uncertain of her immunization history and has no record of immunization should receive the PCV13 vaccine. An adult aged 45 years or older who has certain medical conditions and has not been previously immunized should receive 1 dose of PCV13 vaccine. This PCV13 should be followed with a dose of pneumococcal polysaccharide (PPSV23) vaccine. The PPSV23 vaccine dose should be obtained at least 1 or more year(s) after the dose of PCV13 vaccine. An adult aged 22 years or older who has certain medical conditions and previously received 1 or more doses of PPSV23 vaccine should receive 1 dose of PCV13. The PCV13 vaccine dose should be obtained 1 or more years after the last PPSV23 vaccine dose.    Pneumococcal polysaccharide (PPSV23) vaccine. When PCV13 is also indicated, PCV13 should be obtained first. All adults aged 81 years and older should be immunized. An adult younger than age 73 years who has certain medical conditions should be immunized. Any person who resides in a nursing home or long-term care facility should be immunized. An adult smoker should be immunized. People with an immunocompromised condition and certain other conditions should receive both PCV13 and PPSV23 vaccines. People with human immunodeficiency virus (HIV) infection should  be immunized as soon as possible after diagnosis. Immunization during chemotherapy or radiation therapy should be avoided.  Routine use of PPSV23 vaccine is not recommended for American Indians, [place]atives, or people younger than 65 years unless there are medical conditions that require PPSV23 vaccine. When indicated, people who have unknown immunization and have no record of immunization should receive PPSV23 vaccine. One-time revaccination 5 years after the first dose of PPSV23 is recommended for people aged 19-64 years who have chronic kidney failure, nephrotic syndrome, asplenia, or immunocompromised conditions. People who received 1-2 doses of PPSV23 before age 61 years should receive another dose of PPSV23 vaccine at age 66 years or later if at least 5 years have passed since the previous dose. Doses of PPSV23 are not needed for people immunized with PPSV23 at or after age 19 years.  Preventive Services / Frequency   Ages 46 to 55 years  Blood pressure check.  Lipid and cholesterol check.  Lung cancer screening. / Every year if you are aged 77-80 years and have a 30-pack-year history of smoking and currently smoke or have quit within the past 15 years. Yearly screening is stopped once you have quit smoking for at least 15 years or develop a health problem that would prevent you from having lung cancer treatment.  Clinical breast exam.** / Every year after age 24 years.   BRCA-related cancer risk assessment.** / For women who have family members with a BRCA-related cancer (breast, ovarian, tubal, or peritoneal cancers).  Mammogram.** / Every year beginning at age 70 years and continuing for as long as you are in good health. Consult with your health care provider.  Pap test.** / Every 3 years starting at age 53 years through age 63 or 33 years with a history of 3 consecutive normal Pap tests.  HPV screening.** / Every 3 years from ages 30 years through ages 49 to 65 years with a history of 3 consecutive normal Pap tests.  Fecal occult blood test (FOBT) of stool. / Every year beginning at age 83 years and  continuing until age 14 years. You may not need to do this test if you get a colonoscopy every 10 years.  Flexible sigmoidoscopy or colonoscopy.** / Every 5 years for a flexible sigmoidoscopy or every 10 years for a colonoscopy beginning at age 54 years and continuing until age 60 years.  Hepatitis C blood test.** / For all people born from [birth year] through 1967 and any individual with known risks for hepatitis C.  Skin self-exam. / Monthly.  Influenza vaccine. / Every year.  Tetanus, diphtheria, and acellular pertussis (Tdap/Td) vaccine.** / Consult your health care provider. Pregnant women should receive 1 dose of Tdap vaccine during each pregnancy. 1 dose of Td every 10 years.  Varicella vaccine.** / Consult your health care provider. Pregnant females who do not have evidence of immunity should receive the first dose after pregnancy.  Zoster vaccine.** / 1 dose for adults aged 10 years or older.  Pneumococcal 13-valent conjugate (PCV13) vaccine.** / Consult your health care provider.  Pneumococcal polysaccharide (PPSV23) vaccine.** / 1 to 2 doses if you smoke cigarettes or if you have certain conditions.  Meningococcal vaccine.** / Consult your health care provider.  Hepatitis A vaccine.** / Consult your health care provider.  Hepatitis B vaccine.** / Consult your health care provider. Screening for abdominal aortic aneurysm (AAA)  by ultrasound is recommended for people over 50 who have history of high blood pressure or who are current or  former smokers. ++++++++++++++++++ Recommend Adult Low Dose Aspirin or  coated  Aspirin 81 mg daily  To reduce risk of Colon Cancer 40 %,  Skin Cancer 26 % ,  Melanoma 46%  and  Pancreatic cancer 60% +++++++++++++++++++ Vitamin D goal  is between 70-100.  Please make sure that you are taking your Vitamin D as directed.  It is very important as a natural anti-inflammatory  helping hair, skin, and nails, as well as reducing stroke and heart  attack risk.  It helps your bones and helps with mood. It also decreases numerous cancer risks so please take it as directed.  Low Vit D is associated with a 200-300% higher risk for CANCER  and 200-300% higher risk for HEART   ATTACK  &  STROKE.   .....................................Marland Kitchen It is also associated with higher death rate at younger ages,  autoimmune diseases like Rheumatoid arthritis, Lupus, Multiple Sclerosis.    Also many other serious conditions, like depression, Alzheimer's Dementia, infertility, muscle aches, fatigue, fibromyalgia - just to name a few. ++++++++++++++++++ Recommend the book "The END of DIETING" by Dr Excell Seltzer  & the book "The END of DIABETES " by Dr Excell Seltzer At [email or web address] - get book & Audio CD's    Being diabetic has a  300% increased risk for heart attack, stroke, cancer, and alzheimer- type vascular dementia. It is very important that you work harder with diet by avoiding all foods that are white. Avoid white rice (brown & wild rice is OK), white potatoes (sweetpotatoes in moderation is OK), White bread or wheat bread or anything made out of white flour like bagels, donuts, rolls, buns, biscuits, cakes, pastries, cookies, pizza crust, and pasta (made from white flour & egg whites) - vegetarian pasta or spinach or wheat pasta is OK. Multigrain breads like Arnold's or [employer], or multigrain sandwich thins or flatbreads.  Diet, exercise and weight loss can reverse and cure diabetes in the early stages.  Diet, exercise and weight loss is very important in the control and prevention of complications of diabetes which affects every system in your body, ie. Brain - dementia/stroke, eyes - glaucoma/blindness, heart - heart attack/heart failure, kidneys - dialysis, stomach - gastric paralysis, intestines - malabsorption, nerves - severe painful neuritis, circulation - gangrene & loss of a leg(s), and finally cancer and Alzheimers.    I recommend avoid fried &  greasy foods,  sweets/candy, white rice (brown or wild rice or Quinoa is OK), white potatoes (sweet potatoes are OK) - anything made from white flour - bagels, doughnuts, rolls, buns, biscuits,white and wheat breads, pizza crust and traditional pasta made of white flour & egg white(vegetarian pasta or spinach or wheat pasta is OK).  Multi-grain bread is OK - like multi-grain flat bread or sandwich thins. Avoid alcohol in excess. Exercise is also important.    Eat all the vegetables you want - avoid meat, especially red meat and dairy - especially cheese.  Cheese is the most concentrated form of trans-fats which is the worst thing to clog up our arteries. Veggie cheese is OK which can be found in the fresh produce section at [employer] or [employer] or [employer]  ++++++++++++++++++++++ DASH Eating Plan  DASH stands for "Dietary Approaches to Stop Hypertension."   The DASH eating plan is a healthy eating plan that has been shown to reduce high blood pressure (hypertension). Additional health benefits may include reducing the risk of type 2 diabetes mellitus, heart disease, and stroke. The DASH  eating plan may also help with weight loss. WHAT DO I NEED TO KNOW ABOUT THE DASH EATING PLAN? For the DASH eating plan, you will follow these general guidelines:  Choose foods with a percent daily value for sodium of less than 5% (as listed on the food label).  Use salt-free seasonings or herbs instead of table salt or sea salt.  Check with your health care provider or pharmacist before using salt substitutes.  Eat lower-sodium products, often labeled as "lower sodium" or "no salt added."  Eat fresh foods.  Eat more vegetables, fruits, and low-fat dairy products.  Choose whole grains. Look for the word "whole" as the first word in the ingredient list.  Choose fish   Limit sweets, desserts, sugars, and sugary drinks.  Choose heart-healthy fats.  Eat veggie cheese   Eat more home-cooked food  and less restaurant, buffet, and fast food.  Limit fried foods.  Cook foods using methods other than frying.  Limit canned vegetables. If you do use them, rinse them well to decrease the sodium.  When eating at a restaurant, ask that your food be prepared with less salt, or no salt if possible.                      WHAT FOODS CAN I EAT? Read Dr Fara Olden Fuhrman's books on The End of Dieting & The End of Diabetes  Grains Whole grain or whole wheat bread. Brown rice. Whole grain or whole wheat pasta. Quinoa, bulgur, and whole grain cereals. Low-sodium cereals. Corn or whole wheat flour tortillas. Whole grain cornbread. Whole grain crackers. Low-sodium crackers.  Vegetables Fresh or frozen vegetables (raw, steamed, roasted, or grilled). Low-sodium or reduced-sodium tomato and vegetable juices. Low-sodium or reduced-sodium tomato sauce and paste. Low-sodium or reduced-sodium canned vegetables.   Fruits All fresh, canned (in natural juice), or frozen fruits.  Protein Products  All fish and seafood.  Dried beans, peas, or lentils. Unsalted nuts and seeds. Unsalted canned beans.  Dairy Low-fat dairy products, such as skim or 1% milk, 2% or reduced-fat cheeses, low-fat ricotta or cottage cheese, or plain low-fat yogurt. Low-sodium or reduced-sodium cheeses.  Fats and Oils Tub margarines without trans fats. Light or reduced-fat mayonnaise and salad dressings (reduced sodium). Avocado. Safflower, olive, or canola oils. Natural peanut or almond butter.  Other Unsalted popcorn and pretzels. The items listed above may not be a complete list of recommended foods or beverages. Contact your dietitian for more options.  ++++++++++++++++++  WHAT FOODS ARE NOT RECOMMENDED? Grains/ White flour or wheat flour White bread. White pasta. White rice. Refined cornbread. Bagels and croissants. Crackers that contain trans fat.  Vegetables  Creamed or fried vegetables. Vegetables in a . Regular canned  vegetables. Regular canned tomato sauce and paste. Regular tomato and vegetable juices.  Fruits Dried fruits. Canned fruit in light or heavy syrup. Fruit juice.  Meat and Other Protein Products Meat in general - RED meat & White meat.  Fatty cuts of meat. Ribs, chicken wings, all processed meats as bacon, sausage, bologna, salami, fatback, hot dogs, bratwurst and packaged luncheon meats.  Dairy Whole or 2% milk, cream, half-and-half, and cream cheese. Whole-fat or sweetened yogurt. Full-fat cheeses or blue cheese. Non-dairy creamers and whipped toppings. Processed cheese, cheese spreads, or cheese curds.  Condiments Onion and garlic salt, seasoned salt, table salt, and sea salt. Canned and packaged gravies. Worcestershire sauce. Tartar sauce. Barbecue sauce. Teriyaki sauce. Soy sauce, including reduced sodium. Steak sauce. Fish  sauce. Oyster sauce. Cocktail sauce. Horseradish. Ketchup and mustard. Meat flavorings and tenderizers. Bouillon cubes. Hot sauce. Tabasco sauce. Marinades. Taco seasonings. Relishes.  Fats and Oils Butter, stick margarine, lard, shortening and bacon fat. Coconut, palm kernel, or palm oils. Regular salad dressings.  Pickles and olives. Salted popcorn and pretzels.  The items listed above may not be a complete list of foods and beverages to avoid.

## 2019-11-05 NOTE — Progress Notes (Signed)
FOLLOW UP  Assessment and Plan:    Lauren [name] was seen today for follow-up and other.  Diagnoses and all orders for this visit:  Hypothyroidism, unspecified type Taking levothyroxine 150 mcg daily Reminder to take on an empty stomach 30-31mins before first meal of the day. No antacid medications for 4 hours. -     TSH  Essential hypertension Controlled by diet & exercise Monitor blood pressure at home; call if consistently over 130/80 Continue DASH diet.   Reminder to go to the ER if any CP, SOB, nausea, dizziness, severe HA, changes vision/speech, left arm numbness and tingling and jaw pain. -     CBC with Diff -     COMPLETE METABOLIC PANEL WITH GFR -     Magnesium  Hyperlipidemia LDL goal <100 Continue medications:Pravastatin 40mg  Discussed dietary and exercise modifications Low fat diet -     Lipid Profile  Abnormal glucose Discussed dietary and exercise modifications -     Hemoglobin A1c ([facility])  Vitamin D deficiency Continue supplementation Taking Vitamin D 5,000 IU daily  Fibromyalgia Managing at this time  Primary narcolepsy without cataplexy Follows with neurology Doing well Continue [name] 20mg  daily in am.  Hypercalcemia Will check CMP  Gastroesophageal reflux disease with esophagitis without hemorrhage  Obstructive sleep apnea treated with continuous positive airway pressure (CPAP) Continue CPAP/BiPAP, using nightly for at least 8 hours  Helping with daytime fatigue Weight loss still advised Discussed mask & tubing hygeine  Recurrent major depressive disorder, in partial remission ([facility]) Continue medications: follow up with psych Discussed stress management techniques  Discussed, increase water,intake & good sleep hygiene  Discussed increasing exercise & vegetables in diet   Iron deficiency anemia, unspecified iron deficiency anemia type -     Iron,Total/Total Iron Binding Cap  Malaise and fatigue -     Vitamin D -     Vitamin B12        -       Iron panel  Dyspnea on exertion Improved from last OV Intermittent Discussed possible etiologies Anemia?   Continue diet and meds as discussed. Further disposition pending results of labs. Discussed med's effects and SE's.   Over 30 minutes of exam, counseling, chart review, and critical decision making was performed.   Future Appointments  Date Time Provider [place]  11/05/2019  2:00 PM Garnet Sierras, NP [facility] None  03/04/2020  3:30 PM Lomax, Amy, NP [facility] None  04/15/2020  3:00 PM Vicie Mutters, PA-C [facility] None    ----------------------------------------------------------------------------------------------------------------------  HPI 38 y.o. female  presents for 3 month follow up on HTN, HLD, prediabetes, OSA wi/ morbid obesity, hypothyroidism, depression and vitamin D deficiency.   She has fibromyalgia (reports diagnosed several years ago ~8 years), has seen rheumatology Dr. Estanislado Pandy remotely. She has hx of OSA on CPAP, had extensive problems with hypersomnia, following with neuro and on adderall for narcolepsy. Has not had a recent VO with them.  She reports she is doing well on this regiment and plans to schedule a follow up   She has continued with with fatigue symptoms.  She was last seen on 10/21/19 and had SOB with walking and wearing her mask which was new and started two weeks previous to the appointment. She had some chest discomfort and palpitations at that time, EKG unremarkable.  Chest xray was order, patient did get this imaging.  DVT ruled out related to left leg pain. She was given rx for xanax and prilosec.  She has used  the xanax twice at bedtime which has helped with her sleep. She started the prilosec and reports she started having a rash.  It is from head to toe and very itchy.  Once she stopped taking the medication the rash began to resolve.     BMI is There is no height or weight on file to calculate BMI., she has been  working on diet and exercise. Wt Readings from Last 3 Encounters:  10/21/19 204 lb (92.5 kg)  07/18/19 201 lb (91.2 kg)  06/21/19 200 lb (90.7 kg)   Her blood pressure has been controlled at home, today their BP is    She does not workout. She denies chest pain, shortness of breath, dizziness.   She is on cholesterol medication Pravastatin 40 mg daily and denies myalgias. Her LDL cholesterol is not at goal; trigs are notably elevated. The cholesterol last visit was:   Lab Results  Component Value Date   CHOL 196 10/21/2019   HDL 48 (L) 10/21/2019   LDLCALC 115 (H) 10/21/2019   LDLDIRECT 114.9 04/30/2012   TRIG 212 (H) 10/21/2019   CHOLHDL 4.1 10/21/2019    She has been working on diet and exercise for prediabetes, and denies increased appetite, nausea, paresthesia of the feet, polydipsia, polyuria and visual disturbances. Last A1C in the office was:  Lab Results  Component Value Date   HGBA1C 6.5 (H) 10/21/2019   She is on thyroid medication. Her medication was changed last visit.  Currently taking 1 tab DAILY rather than 1/2 on Sunday Lab Results  Component Value Date   TSH 0.65 10/21/2019   Patient is on Vitamin D supplement.   Lab Results  Component Value Date   VD25OH 48 10/21/2019        Current Medications:  Current Outpatient Medications on File Prior to Visit  Medication Sig  . ALPRAZolam (XANAX) 0.5 MG tablet Take 1 tablet (0.5 mg total) by mouth at bedtime as needed.  Marland Kitchen. amphetamine-dextroamphetamine ([name]) 20 MG 24 hr capsule Take 20 mg by mouth daily.  . ARIPiprazole ([name]) 10 MG tablet Take one tablet by mouth daily (Patient taking differently: Take 10 mg by mouth daily. Take one tablet by mouth daily)  . Cholecalciferol (VITAMIN D3) 125 MCG (5000 UT) CAPS Take 1 capsule by mouth daily.  . Ferrous Sulfate 140 (45 Fe) MG TBCR Take 1 tablet by mouth daily.  Marland Kitchen. L-Methylfolate-Algae (DEPLIN 15 PO) Take by mouth.  . levothyroxine (SYNTHROID) 150 MCG  tablet TAKE 1 TABLET BY MOUTH EVERY DAY BEFORE BREAKFAST  . omeprazole (PRILOSEC) 40 MG capsule Take 1 capsule (40 mg total) by mouth daily.  . pravastatin (PRAVACHOL) 40 MG tablet Take 1 tablet at Bedtime for Cholesterol  . rosuvastatin (CRESTOR) 5 MG tablet Take 1 tablet (5 mg total) by mouth at bedtime.  . vitamin E 100 UNIT capsule Take by mouth daily.  Marland Kitchen. vortioxetine HBr ([name]) 20 MG TABS tablet Take 20 mg by mouth daily.   No current facility-administered medications on file prior to visit.     Allergies:  Allergies  Allergen Reactions  . Lamictal [Lamotrigine] Rash  . Doxycycline Other (See Comments)    GI UPSET  . Effexor [Venlafaxine] Other (See Comments)    DYSPHORIA  . Prednisone Other (See Comments)    FLUSHED  . Savella [Milnacipran Hcl] Other (See Comments)    NO RELIEF     Medical History:  Past Medical History:  Diagnosis Date  . Anemia   .  Anxiety   . Constipation   . Depression   . Fibromyalgia   . GERD (gastroesophageal reflux disease)   . Hemorrhoids   . HSV-1 infection   . Hypertension   . Hypothyroidism   . OSA on CPAP   . Other abnormal glucose   . Preeclampsia    Family history- Reviewed and unchanged Social history- Reviewed and unchanged   Review of Systems:  Review of Systems  Constitutional: Negative for chills, diaphoresis, fever, malaise/fatigue and weight loss.  HENT: Negative for ear discharge, ear pain, hearing loss and tinnitus.   Eyes: Negative for blurred vision and double vision.  Respiratory: Negative for cough, hemoptysis, sputum production, shortness of breath and wheezing.   Cardiovascular: Negative for chest pain, palpitations, orthopnea, claudication and leg swelling.  Gastrointestinal: Negative for abdominal pain, blood in stool, constipation, diarrhea, heartburn, melena, nausea and vomiting.  Genitourinary: Negative.  Negative for dysuria, flank pain, frequency, hematuria and urgency.  Musculoskeletal: Negative  for joint pain and myalgias.  Skin: Negative for rash.  Neurological: Negative for dizziness, tingling, sensory change, weakness and headaches.  Endo/Heme/Allergies: Negative for polydipsia.  Psychiatric/Behavioral: Positive for depression. Negative for substance abuse and suicidal ideas. The patient is nervous/anxious. The patient does not have insomnia.   All other systems reviewed and are negative.   Eye exam June 2020 -potential glaucoma?  She could not remember   Physical Exam: There were no vitals taken for this visit. Wt Readings from Last 3 Encounters:  10/21/19 204 lb (92.5 kg)  07/18/19 201 lb (91.2 kg)  06/21/19 200 lb (90.7 kg)   General Appearance: Well nourished, in no apparent distress. Eyes: PERRLA, EOMs, conjunctiva no swelling or erythema Sinuses: No Frontal/maxillary tenderness ENT/Mouth: TM without erythema, bulging. No rythematous, no tragus tenderness, no mastoid bogginess, no discharge. No erythema, swelling, or exudate on post pharynx.  Tonsils not swollen or erythematous. Hearing normal.  Neck: Supple, thyroid normal.  Respiratory: Respiratory effort normal, BS equal bilaterally without rales, rhonchi, wheezing or stridor.  Cardio: RRR with no MRGs. Brisk peripheral pulses without edema.  Abdomen: Soft, + BS.  Non tender, no guarding, rebound, hernias, masses. Lymphatics: Non tender without lymphadenopathy.  Musculoskeletal: Full ROM, 5/5 strength, Normal gait Skin: Warm, dry without rashes, lesions, ecchymosis.  Neuro: Cranial nerves intact. No cerebellar symptoms.  Psych: Awake and oriented X 3, normal affect, Insight and Judgment appropriate.    Elder Negus, NP 14:46 PM [facility]

## 2019-11-06 ENCOUNTER — Ambulatory Visit: Payer: BC Managed Care – PPO | Admitting: Physician Assistant

## 2019-11-06 LAB — CBC WITH DIFFERENTIAL/PLATELET
Absolute Monocytes: 374 cells/uL (ref 200–950)
Basophils Absolute: 23 cells/uL (ref 0–200)
Basophils Relative: 0.3 %
Eosinophils Absolute: 47 cells/uL (ref 15–500)
Eosinophils Relative: 0.6 %
HCT: 39.8 % (ref 35.0–45.0)
Hemoglobin: 13.2 g/dL (ref 11.7–15.5)
Lymphs Abs: 2340 cells/uL (ref 850–3900)
MCH: 28.7 pg (ref 27.0–33.0)
MCHC: 33.2 g/dL (ref 32.0–36.0)
MCV: 86.5 fL (ref 80.0–100.0)
MPV: 11.8 fL (ref 7.5–12.5)
Monocytes Relative: 4.8 %
Neutro Abs: 5015 cells/uL (ref 1500–7800)
Neutrophils Relative %: 64.3 %
Platelets: 166 10*3/uL (ref 140–400)
RBC: 4.6 10*6/uL (ref 3.80–5.10)
RDW: 12.9 % (ref 11.0–15.0)
Total Lymphocyte: 30 %
WBC: 7.8 10*3/uL (ref 3.8–10.8)

## 2019-11-06 LAB — COMPLETE METABOLIC PANEL WITH GFR
AG Ratio: 1.8 (calc) (ref 1.0–2.5)
ALT: 20 U/L (ref 6–29)
AST: 18 U/L (ref 10–30)
Albumin: 4.4 g/dL (ref 3.6–5.1)
Alkaline phosphatase (APISO): 55 U/L (ref 31–125)
BUN: 10 mg/dL (ref 7–25)
CO2: 20 mmol/L (ref 20–32)
Calcium: 9.6 mg/dL (ref 8.6–10.2)
Chloride: 108 mmol/L (ref 98–110)
Creat: 0.98 mg/dL (ref 0.50–1.10)
GFR, Est African American: 84 mL/min/{1.73_m2} (ref >=60)
GFR, Est Non African American: 72 mL/min/{1.73_m2} (ref >=60)
Globulin: 2.5 g/dL (calc) (ref 1.9–3.7)
Glucose, Bld: 192 mg/dL — ABNORMAL HIGH (ref 65–99)
Potassium: 4.1 mmol/L (ref 3.5–5.3)
Sodium: 145 mmol/L (ref 135–146)
Total Bilirubin: 0.3 mg/dL (ref 0.2–1.2)
Total Protein: 6.9 g/dL (ref 6.1–8.1)

## 2019-11-06 LAB — TSH: TSH: 0.32 mIU/L — ABNORMAL LOW

## 2019-11-06 LAB — LIPID PANEL
Cholesterol: 167 mg/dL (ref <200)
HDL: 47 mg/dL — ABNORMAL LOW (ref >=50)
LDL Cholesterol (Calc): 90 mg/dL (calc)
Non-HDL Cholesterol (Calc): 120 mg/dL (calc) (ref <130)
Total CHOL/HDL Ratio: 3.6 (calc) (ref <5.0)
Triglycerides: 209 mg/dL — ABNORMAL HIGH (ref <150)

## 2019-11-06 LAB — IRON, TOTAL/TOTAL IRON BINDING CAP
%SAT: 10 % (calc) — ABNORMAL LOW (ref 16–45)
Iron: 34 ug/dL — ABNORMAL LOW (ref 40–190)
TIBC: 333 mcg/dL (calc) (ref 250–450)

## 2019-11-06 LAB — VITAMIN B12: Vitamin B-12: 503 pg/mL (ref 200–1100)

## 2019-11-06 LAB — HEMOGLOBIN A1C
Hgb A1c MFr Bld: 6.3 % of total Hgb — ABNORMAL HIGH (ref <5.7)
Mean Plasma Glucose: 134 (calc)
eAG (mmol/L): 7.4 (calc)

## 2019-11-06 LAB — MAGNESIUM: Magnesium: 2.1 mg/dL (ref 1.5–2.5)

## 2019-11-12 ENCOUNTER — Encounter: Payer: Self-pay | Admitting: Adult Health Nurse Practitioner

## 2019-11-12 ENCOUNTER — Ambulatory Visit (INDEPENDENT_AMBULATORY_CARE_PROVIDER_SITE_OTHER): Payer: BC Managed Care – PPO | Admitting: Adult Health Nurse Practitioner

## 2019-11-12 ENCOUNTER — Other Ambulatory Visit: Payer: Self-pay

## 2019-11-12 VITALS — BP 110/70 | HR 96 | Temp 97.5°F | Wt 200.0 lb

## 2019-11-12 DIAGNOSIS — I10 Essential (primary) hypertension: Secondary | ICD-10-CM

## 2019-11-12 DIAGNOSIS — R590 Localized enlarged lymph nodes: Secondary | ICD-10-CM

## 2019-11-12 DIAGNOSIS — L729 Follicular cyst of the skin and subcutaneous tissue, unspecified: Secondary | ICD-10-CM | POA: Diagnosis not present

## 2019-11-12 DIAGNOSIS — K2100 Gastro-esophageal reflux disease with esophagitis, without bleeding: Secondary | ICD-10-CM | POA: Diagnosis not present

## 2019-11-12 NOTE — Progress Notes (Signed)
Assessment and Plan:  Diagnoses and all orders for this visit:  Scalp cyst Warm moist compress TID Monitor area  Adenopathy, cervical Discussed ibuprofen 80mg  Q8 OR 600mg  Q6, take with food Continue to monitor for any new symptoms  Essential hypertension Monitor blood pressure Monitor blood pressure at home; call if consistently over 130/80 Continue DASH diet.   Reminder to go to the ER if any CP, SOB, nausea, dizziness, severe HA, changes vision/speech, left arm numbness and tingling and jaw pain.  Gastroesophageal reflux disease with esophagitis without hemorrhage Doing well at this time Take OTC pepcid if increasing symptoms    Further disposition pending results of labs. Discussed med's effects and SE's.   Over 30 minutes of exam, counseling, chart review, and critical decision making was performed.   Future Appointments  Date Time Provider Department Center  12/18/2019  4:00 PM [facility] NURSE [facility] None  03/04/2020  3:30 PM 12/20/2019, NP [facility] None  04/15/2020  3:00 PM Shawnie Dapper, PA-C [facility] None    ------------------------------------------------------------------------------------------------------------------   HPI 38 y.o.female presents for knot on the right side of the back of her neck.  She first noticed this five days ago.  She denies any tenderness or open area.  There is also a knot on the back of her head that has been there for years, 5?  She has been watching this and now reports it has increased in size and tenderness.  She reports the edges are what is tender to touch.    Denies any fevers or chills, otalgia, jaw pain, post nasal drip, headaches, neck pain, rhinitis, cough, sore throat, shortness of breath.   Past Medical History:  Diagnosis Date  . Anemia   . Anxiety   . Constipation   . Depression   . Fibromyalgia   . GERD (gastroesophageal reflux disease)   . Hemorrhoids   . HSV-1 infection   . Hypertension   . Hypothyroidism    . OSA on CPAP   . Other abnormal glucose   . Preeclampsia      Allergies  Allergen Reactions  . Lamictal [Lamotrigine] Rash  . Doxycycline Other (See Comments)    GI UPSET  . Effexor [Venlafaxine] Other (See Comments)    DYSPHORIA  . Prednisone Other (See Comments)    FLUSHED  . Savella [Milnacipran Hcl] Other (See Comments)    NO RELIEF    Current Outpatient Medications on File Prior to Visit  Medication Sig  . ALPRAZolam (XANAX) 0.5 MG tablet Take 1 tablet (0.5 mg total) by mouth at bedtime as needed.  Quentin Mulling amphetamine-dextroamphetamine ([name]) 20 MG 24 hr capsule Take 20 mg by mouth daily.  . ARIPiprazole ([name]) 10 MG tablet Take one tablet by mouth daily (Patient taking differently: Take 10 mg by mouth daily. Takes 2.5mg  tablet daily)  . Cholecalciferol (VITAMIN D3) 125 MCG (5000 UT) CAPS Take 1 capsule by mouth daily.  . Ferrous Sulfate 140 (45 Fe) MG TBCR Take 1 tablet by mouth daily.  41 L-Methylfolate-Algae (DEPLIN 15 PO) Take by mouth.  . levothyroxine (SYNTHROID) 150 MCG tablet TAKE 1 TABLET BY MOUTH EVERY DAY BEFORE BREAKFAST  . pravastatin (PRAVACHOL) 40 MG tablet Take 1 tablet at Bedtime for Cholesterol  . rosuvastatin (CRESTOR) 5 MG tablet Take 1 tablet (5 mg total) by mouth at bedtime.  . vitamin E 100 UNIT capsule Take by mouth daily.  Marland Kitchen vortioxetine HBr ([name]) 20 MG TABS tablet Take 20 mg by mouth daily.   No current  facility-administered medications on file prior to visit.    ROS: all negative except above.   Physical Exam:  BP 110/70   Pulse 96   Temp (!) 97.5 F (36.4 C)   Wt 200 lb (90.7 kg)   SpO2 98%   BMI 37.79 kg/m   General Appearance: Well nourished, in no apparent distress. Eyes: PERRLA, EOMs, conjunctiva no swelling or erythema Sinuses: No Frontal/maxillary tenderness ENT/Mouth: Ext aud canals clear, TMs without erythema, bulging. No erythema, swelling, or exudate on post pharynx.  Tonsils not swollen or erythematous.  Hearing normal.  Neck: Supple, thyroid normal.  Respiratory: Respiratory effort normal, BS equal bilaterally without rales, rhonchi, wheezing or stridor.  Cardio: RRR with no MRGs. Brisk peripheral pulses without edema.  Abdomen: Soft, + BS.  Non tender, no guarding, rebound, hernias, masses. Lymphatics: Right posterior cervical lymphadenopathy, tender to palpation.  Non tender remaining lymph without adenopathy.  Musculoskeletal: Full ROM, 5/5 strength, normal gait.  Skin: Warm, dry without rashes, lesions, ecchymosis. Firm, non-moveable pea size area, tender to palpation.  No erythema noted. Neuro: Cranial nerves intact. Normal muscle tone, no cerebellar symptoms. Sensation intact.  Psych: Awake and oriented X 3, normal affect, Insight and Judgment appropriate.     Garnet Sierras, NP 4:01 PM [facility]

## 2019-11-12 NOTE — Patient Instructions (Addendum)
   Warm moist compress to the area three times a day.  Check the area to see if this changes or brings anything to the surface.  Have someone check the area for you.    Ibuprofen 800mg  every 8 hours with food for one week. Please take this food.  Monitor the area on the right side of your neck.  This is a swollen lymph node.  Should you have any new or worsening symptoms please let us know.

## 2019-11-13 ENCOUNTER — Ambulatory Visit: Payer: BC Managed Care – PPO | Admitting: Physician Assistant

## 2019-11-21 ENCOUNTER — Encounter: Payer: Self-pay | Admitting: Physician Assistant

## 2019-11-21 ENCOUNTER — Ambulatory Visit: Payer: BC Managed Care – PPO | Admitting: Physician Assistant

## 2019-11-21 ENCOUNTER — Other Ambulatory Visit: Payer: Self-pay

## 2019-11-21 VITALS — BP 118/74 | HR 97 | Temp 97.9°F | Ht 61.0 in | Wt 199.0 lb

## 2019-11-21 DIAGNOSIS — M791 Myalgia, unspecified site: Secondary | ICD-10-CM

## 2019-11-21 DIAGNOSIS — R42 Dizziness and giddiness: Secondary | ICD-10-CM | POA: Diagnosis not present

## 2019-11-21 DIAGNOSIS — E039 Hypothyroidism, unspecified: Secondary | ICD-10-CM

## 2019-11-21 DIAGNOSIS — R590 Localized enlarged lymph nodes: Secondary | ICD-10-CM

## 2019-11-21 DIAGNOSIS — H9203 Otalgia, bilateral: Secondary | ICD-10-CM | POA: Diagnosis not present

## 2019-11-21 MED ORDER — AZITHROMYCIN 250 MG PO TABS: ORAL_TABLET | ORAL | 1 refills | Status: AC

## 2019-11-21 MED ORDER — MECLIZINE HCL 25 MG PO TABS: ORAL_TABLET | ORAL | 0 refills | Status: DC

## 2019-11-21 NOTE — Patient Instructions (Addendum)
Take the iron capsules with vitamin C 500mg  a day, can do twice a day for 2-4 weeks as tolerated, can take with probiotic to avoid constipation Can take with pepcid if you get heart burn  Try to take it 4 hours after your thyroid medication.   YOU CAN CALL TO MAKE AN ULTRASOUND..  I have put in an order for an ultrasound for you to have You can set them up at your convenience by calling this number [phone] You will likely have the ultrasound at [address] [facility] [address]  If you have any issues call our office and we will set this up for you.    Your ears and sinuses are connected by the eustachian tube. When your sinuses are inflamed, this can close off the tube and cause fluid to collect in your middle ear. This can then cause dizziness, popping, clicking, ringing, and echoing in your ears. This is often NOT an infection and does NOT require antibiotics, it is caused by inflammation so the treatments help the inflammation. This can take a long time to get better so please be patient.  Here are things you can do to help with this: - Try the Flonase or Nasonex. Remember to spray each nostril twice towards the outer part of your eye.  Do not sniff but instead pinch your nose and tilt your head back to help the medicine get into your sinuses.  The best time to do this is at bedtime.Stop if you get blurred vision or nose bleeds.  -While drinking fluids, pinch and hold nose close and swallow, to help open eustachian tubes to drain fluid behind ear drums. -Please pick one of the over the counter allergy medications below and take it once daily for allergies.  It will also help with fluid behind ear drums. Claritin or loratadine cheapest but likely the weakest  Zyrtec or certizine at night because it can make you sleepy The strongest is allegra or fexafinadine  Cheapest at [employer], [employer], [employer] -can use decongestant over the counter, please do not use if you have high blood pressure or  certain heart conditions.   if worsening HA, changes vision/speech, imbalance, weakness go to the ER    [place] is now requiring appointments for testing, you should be able to request an appointment by going to [facility] or by texting "COVID" to [phone].  You can also call [phone] for more information about community testing.   There are also several urgent cares that are testing, the PCR send out is a better test than the rapid test.  The pt's will remain in the car and wear a mask when going for testing.The staff will come to the car to perform testing. The pt's will need to bring an ID. People are getting results in 24-72 hours.  During this time please quarantine you and your family.   [address] [facility] Entrance)  [place]- [address] [address]  [place]-[address] [address]    To stop home isolation IF you test positive, you will need all three of the statements below.  1) You need to be fever free for 3 days WITHOUT tylenol.  2) Your symptoms such as cough, shortness of breath, diarrhea, etc need to improve.  3) It needs to be at least 10 days since your symptoms first appeared    After you stop home isolation, please continue to wear a mask in public and  continue hand washing and contact precautions.   Things to do if you test positive: Please do vitamin C 1000mg  twice a day- stop if you have any worsening heart burn or diarrhea.   You can take tylenol for fevers above 101 if you feel uncomfortable. Remember a fever itself is not bad, the tylenol is more for your comfort. See the max of tylenol below.  Please do breathing exercises. Make sure that you are taking deep breaths and trying to walk around the room as much as possible.  Please lay "prone" or on your belly for up to 4 hours a day, you can split this up to 30 minute or 1 hour increments but this helps get oxygen to certain places  of your lungs.  Please get on 81 mg of aspirin unless you are unable to tolerate this or have a contraindication.  Please pump your feet like your are pedaling a bike to prevent clots in your legs.   Push fluids with Gatorade or electrolyte replacement in addition to water, aim for 100-120 oz a day if no contraindications.  If you have worsening shortness of breath, unable to complete full sentences, are panting, please contact the office immediately for call 911. Let them know you are being monitored for coronavirus.

## 2019-11-21 NOTE — Progress Notes (Signed)
Subjective:    Patient ID: Lauren [name], female    DOB: 30-Nov-1980, 38 y.o.   MRN: [id number]  HPI 38 y.o. WF presents with ear pain. She states Monday she felt like a "ton of bricks" hit her. Bilateral ears feel like there is pressure, right worse than left. She has brain fog, HA sometimes, dizziness, body aches, chills. No known exposure to COVID.   She cut back on her abilify to 2.5mg , 2 weeks before christmas.  She has been on crestor x 2 months. She is having muscle aches.  She is still on thyroid, take 1 pill daily but 1/2 pill 2 days a week, changed last visit.  Lab Results  Component Value Date   TSH 0.32 (L) 11/05/2019    Her iron was low 2 weeks ago, she has not increased her iron.   Blood pressure 118/74, pulse 97, temperature 97.9 F (36.6 C), height 5\' 1"  (1.549 m), weight 199 lb (90.3 kg), SpO2 99 %.  Medications  Current Outpatient Medications (Endocrine & Metabolic):  .  levothyroxine (SYNTHROID) 150 MCG tablet, TAKE 1 TABLET BY MOUTH EVERY DAY BEFORE BREAKFAST  Current Outpatient Medications (Cardiovascular):  .  rosuvastatin (CRESTOR) 5 MG tablet, Take 1 tablet (5 mg total) by mouth at bedtime.    Current Outpatient Medications (Hematological):   Ferrous Sulfate 140 (45 Fe) MG TBCR, Take 1 tablet by mouth daily.  Current Outpatient Medications (Other):  Marland Kitchen  ALPRAZolam (XANAX) 0.5 MG tablet, Take 1 tablet (0.5 mg total) by mouth at bedtime as needed. Marland Kitchen  amphetamine-dextroamphetamine ([name]) 20 MG 24 hr capsule, Take 20 mg by mouth daily. .  ARIPiprazole ([name]) 10 MG tablet, Take one tablet by mouth daily (Patient taking differently: Take 10 mg by mouth daily. Takes 2.5mg  tablet daily) .  Cholecalciferol (VITAMIN D3) 125 MCG (5000 UT) CAPS, Take 1 capsule by mouth daily. Marland Kitchen  L-Methylfolate-Algae (DEPLIN 15 PO), Take by mouth. .  vitamin E 100 UNIT capsule, Take by mouth daily. Marland Kitchen  vortioxetine HBr ([name]) 20 MG TABS tablet, Take 20 mg by  mouth daily. Marland Kitchen  azithromycin (ZITHROMAX) 250 MG tablet, Take 2 tablets (500 mg) on  Day 1,  followed by 1 tablet (250 mg) once daily on Days 2 through 5. .  meclizine (ANTIVERT) 25 MG tablet, 1/2-1 pill up to 3 times daily for motion sickness/dizziness  Problem list She has Hypothyroidism; Paralysis, periodic secondary; Depression; Hypertension; Abnormal glucose; Hyperlipidemia LDL goal <100; Morbid obesity (HCC); Medication management; Paraparesis (HCC); Headache associated with sexual activity; Dizziness; Hepatomegaly; Hepatic steatosis; Recurrent isolated sleep paralysis; Hypersomnia with sleep apnea; Obstructive sleep apnea treated with continuous positive airway pressure (CPAP); Fibromyalgia; Liver lesion, left lobe; Narcolepsy; and Carpal tunnel syndrome, bilateral on their problem list.  Review of Systems  Constitutional: Positive for chills and fatigue. Negative for activity change, appetite change, diaphoresis, fever and unexpected weight change.  HENT: Positive for ear pain, postnasal drip and sinus pressure. Negative for congestion, dental problem, drooling, ear discharge, facial swelling, hearing loss, mouth sores, nosebleeds, rhinorrhea, sinus pain, sneezing, sore throat, tinnitus, trouble swallowing and voice change.   Eyes: Negative.   Respiratory: Positive for shortness of breath. Negative for apnea, cough, choking, chest tightness, wheezing and stridor.   Cardiovascular: Negative.   Gastrointestinal: Negative.   Genitourinary: Negative.   Musculoskeletal: Positive for arthralgias and myalgias. Negative for back pain, gait problem, joint swelling, neck pain and neck stiffness.  Skin: Negative.  Negative for rash.  Neurological:  Positive for dizziness and headaches. Negative for tremors, seizures, syncope, facial asymmetry, speech difficulty, weakness, light-headedness and numbness.       Objective:   Physical Exam Vitals and nursing note reviewed.  Constitutional:       General: She is not in acute distress.    Appearance: She is well-developed. She is not diaphoretic.  HENT:     Head: Normocephalic and atraumatic.     Right Ear: Hearing and ear canal normal. Tympanic membrane has decreased mobility.     Left Ear: Hearing and ear canal normal. Tympanic membrane has decreased mobility.     Mouth/Throat:     Pharynx: No oropharyngeal exudate.  Eyes:     General: No scleral icterus.    Conjunctiva/sclera: Conjunctivae normal.     Pupils: Pupils are equal, round, and reactive to light.  Neck:     Thyroid: No thyroid mass, thyromegaly or thyroid tenderness.     Vascular: No JVD.  Cardiovascular:     Rate and Rhythm: Normal rate and regular rhythm.     Heart sounds: Normal heart sounds. No murmur. No friction rub. No gallop.   Pulmonary:     Effort: Pulmonary effort is normal. No respiratory distress.     Breath sounds: Normal breath sounds. No wheezing or rales.  Chest:     Chest wall: No tenderness.  Abdominal:     General: Bowel sounds are normal. There is no distension.     Palpations: Abdomen is soft. There is no mass.     Tenderness: There is no abdominal tenderness. There is no guarding or rebound.  Musculoskeletal:        General: Normal range of motion.     Cervical back: Normal range of motion and neck supple. No spinous process tenderness or muscular tenderness.  Lymphadenopathy:     Cervical: Cervical adenopathy present.     Right cervical: Posterior cervical adenopathy present. No superficial or deep cervical adenopathy.    Left cervical: No superficial, deep or posterior cervical adenopathy.  Skin:    General: Skin is warm and dry.  Neurological:     Mental Status: She is alert and oriented to person, place, and time.     Cranial Nerves: No cranial nerve deficit.     Sensory: No sensory deficit.     Coordination: Coordination normal.  Psychiatric:        Mood and Affect: Mood is depressed.        Speech: Speech normal.         Behavior: Behavior is slowed.        Thought Content: Thought content normal.        Judgment: Judgment normal.           Assessment & Plan:   With general complaints. I have also instructed the patient to get tested for COVID and quarantine, she given information.  Posterior cervical adenopathy -     CBC with Diff -     Sedimentation rate -     Epstein-Barr virus VCA antibody panel -     US Soft Tissue Head/Neck; Future - rule out mono, check US neck  Myalgia -     CK - recent switch from pravastatin to crestor, will check CPK  Ear pain, bilateral -     azithromycin (ZITHROMAX) 250 MG tablet; Take 2 tablets (500 mg) on  Day 1,  followed by 1 tablet (250 mg) once daily on Days 2 through 5. Given information about effusion  Vertigo -     COMPLETE METABOLIC PANEL WITH GFR -     TSH -     meclizine (ANTIVERT) 25 MG tablet; 1/2-1 pill up to 3 times daily for motion sickness/dizziness ? From her ears/effusion, normal neuro, will give mecilzine ER precautions discussed  Hypothyroidism, unspecified type -     TSH - with recent change in medication, will recheck.   The patient was advised to call immediately if she has any concerning symptoms in the interval. The patient voices understanding of current treatment options and is in agreement with the current care plan.The patient knows to call the clinic with any problems, questions or concerns or go to the ER if any further progression of symptoms.

## 2019-11-25 LAB — CBC WITH DIFFERENTIAL/PLATELET
Absolute Monocytes: 348 cells/uL (ref 200–950)
Basophils Absolute: 12 cells/uL (ref 0–200)
Basophils Relative: 0.2 %
Eosinophils Absolute: 42 cells/uL (ref 15–500)
Eosinophils Relative: 0.7 %
HCT: 36.9 % (ref 35.0–45.0)
Hemoglobin: 12.1 g/dL (ref 11.7–15.5)
Lymphs Abs: 2388 cells/uL (ref 850–3900)
MCH: 28.2 pg (ref 27.0–33.0)
MCHC: 32.8 g/dL (ref 32.0–36.0)
MCV: 86 fL (ref 80.0–100.0)
MPV: 11.6 fL (ref 7.5–12.5)
Monocytes Relative: 5.8 %
Neutro Abs: 3210 cells/uL (ref 1500–7800)
Neutrophils Relative %: 53.5 %
Platelets: 189 10*3/uL (ref 140–400)
RBC: 4.29 10*6/uL (ref 3.80–5.10)
RDW: 12.6 % (ref 11.0–15.0)
Total Lymphocyte: 39.8 %
WBC: 6 10*3/uL (ref 3.8–10.8)

## 2019-11-25 LAB — EPSTEIN-BARR VIRUS VCA ANTIBODY PANEL
EBV NA IgG: 562 U/mL — ABNORMAL HIGH
EBV VCA IgG: 61.5 U/mL — ABNORMAL HIGH
EBV VCA IgM: <36 U/mL

## 2019-11-25 LAB — COMPLETE METABOLIC PANEL WITH GFR
AG Ratio: 1.7 (calc) (ref 1.0–2.5)
ALT: 19 U/L (ref 6–29)
AST: 16 U/L (ref 10–30)
Albumin: 4.2 g/dL (ref 3.6–5.1)
Alkaline phosphatase (APISO): 59 U/L (ref 31–125)
BUN: 9 mg/dL (ref 7–25)
CO2: 29 mmol/L (ref 20–32)
Calcium: 9.1 mg/dL (ref 8.6–10.2)
Chloride: 102 mmol/L (ref 98–110)
Creat: 0.72 mg/dL (ref 0.50–1.10)
GFR, Est African American: 121 mL/min/{1.73_m2} (ref >=60)
GFR, Est Non African American: 105 mL/min/{1.73_m2} (ref >=60)
Globulin: 2.5 g/dL (calc) (ref 1.9–3.7)
Glucose, Bld: 218 mg/dL — ABNORMAL HIGH (ref 65–99)
Potassium: 4.1 mmol/L (ref 3.5–5.3)
Sodium: 138 mmol/L (ref 135–146)
Total Bilirubin: 0.3 mg/dL (ref 0.2–1.2)
Total Protein: 6.7 g/dL (ref 6.1–8.1)

## 2019-11-25 LAB — SEDIMENTATION RATE: Sed Rate: 14 mm/h (ref 0–20)

## 2019-11-25 LAB — TSH: TSH: 0.58 mIU/L

## 2019-11-25 LAB — CK: Total CK: 62 U/L (ref 29–143)

## 2019-11-26 ENCOUNTER — Ambulatory Visit
Admission: RE | Admit: 2019-11-26 | Discharge: 2019-11-26 | Disposition: A | Discharge status: Home / Self Care | Payer: BC Managed Care – PPO | Source: Ambulatory Visit | Attending: Physician Assistant | Admitting: Physician Assistant

## 2019-11-26 DIAGNOSIS — R590 Localized enlarged lymph nodes: Secondary | ICD-10-CM

## 2019-12-02 NOTE — Progress Notes (Signed)
Subjective:    Patient ID: Lauren [name], female    DOB: 05/23/1981, 39 y.o.   MRN: [id number]  HPI 39 y.o. WF presents for follow up. She was seen last visit for ear pain and possible cervical adenopathy and fatigue.  Her US soft tissue neck was normal, states she feels that a knot is on her left side.  She did mention that she felt she had axillary fullness.  Normal MGM 04/02/2019.   She feels she has a "lump" on her left upper AB.  Negative muscle enzymes, no tick exposure. Her random sugars have been elevated and last A1C was 6.3. Lab Results  Component Value Date   HGBA1C 6.3 (H) 11/05/2019   She is back on her [name] 5 mg recently, states she is feeling better.   She is still on thyroid, was normal last visit.  Lab Results  Component Value Date   TSH 0.58 11/21/2019    Her iron was low 2 weeks ago, she is on 2 a day with vitamin C.  Lab Results  Component Value Date   IRON 34 (L) 11/05/2019   TIBC 333 11/05/2019   FERRITIN 85 04/10/2019    Blood pressure 130/86, pulse 91, temperature (!) 97.4 F (36.3 C), height 5\' 1"  (1.549 m), weight 200 lb 6.4 oz (90.9 kg), SpO2 99 %.  Medications  Current Outpatient Medications (Endocrine & Metabolic):  .  levothyroxine (SYNTHROID) 150 MCG tablet, TAKE 1 TABLET BY MOUTH EVERY DAY BEFORE BREAKFAST  Current Outpatient Medications (Cardiovascular):  .  rosuvastatin (CRESTOR) 5 MG tablet, Take 1 tablet (5 mg total) by mouth at bedtime.    Current Outpatient Medications (Hematological):   Ferrous Sulfate 140 (45 Fe) MG TBCR, Take 1 tablet by mouth daily.  Current Outpatient Medications (Other):  Marland Kitchen  ALPRAZolam (XANAX) 0.5 MG tablet, Take 1 tablet (0.5 mg total) by mouth at bedtime as needed. Marland Kitchen  amphetamine-dextroamphetamine ([name]) 20 MG 24 hr capsule, Take 20 mg by mouth daily. .  ARIPiprazole ([name]) 10 MG tablet, Take one half tablet by mouth daily, 5 mg daily .  Armodafinil 250 MG tablet, Take 1 tablet (250  mg total) by mouth daily. .  Cholecalciferol (VITAMIN D3) 125 MCG (5000 UT) CAPS, Take 1 capsule by mouth daily. Marland Kitchen  L-Methylfolate-Algae (DEPLIN 15 PO), Take by mouth. .  meclizine (ANTIVERT) 25 MG tablet, 1/2-1 pill up to 3 times daily for motion sickness/dizziness .  vitamin E 100 UNIT capsule, Take by mouth daily. Marland Kitchen  vortioxetine HBr ([name]) 20 MG TABS tablet, Take 20 mg by mouth daily.  Problem list She has Hypothyroidism; Paralysis, periodic secondary; Depression; Hypertension; Abnormal glucose; Hyperlipidemia LDL goal <100; Morbid obesity (HCC); Medication management; Paraparesis (HCC); Headache associated with sexual activity; Dizziness; Hepatomegaly; Hepatic steatosis; Recurrent isolated sleep paralysis; Hypersomnia with sleep apnea; Obstructive sleep apnea treated with continuous positive airway pressure (CPAP); Fibromyalgia; Liver lesion, left lobe; Narcolepsy; Carpal tunnel syndrome, bilateral; and Major depressive disorder, recurrent episode, moderate (HCC) on their problem list.  Review of Systems  Constitutional: Positive for fatigue. Negative for activity change, appetite change, chills, diaphoresis, fever and unexpected weight change.  HENT: Negative for congestion, dental problem, drooling, ear discharge, ear pain, facial swelling, hearing loss, mouth sores, nosebleeds, postnasal drip, rhinorrhea, sinus pressure, sinus pain, sneezing, sore throat, tinnitus, trouble swallowing and voice change.   Eyes: Negative.   Respiratory: Negative for apnea, cough, choking, chest tightness, shortness of breath, wheezing and stridor.   Cardiovascular: Negative.  Gastrointestinal: Negative.   Genitourinary: Negative.   Musculoskeletal: Positive for arthralgias and myalgias. Negative for back pain, gait problem, joint swelling, neck pain and neck stiffness.  Skin: Negative.  Negative for rash.  Neurological: Positive for dizziness and headaches. Negative for tremors, seizures, syncope,  facial asymmetry, speech difficulty, weakness, light-headedness and numbness.       Objective:   Physical Exam Vitals and nursing note reviewed.  Constitutional:      General: She is not in acute distress.    Appearance: She is well-developed. She is not diaphoretic.  HENT:     Head: Normocephalic and atraumatic.     Right Ear: Hearing and ear canal normal. Tympanic membrane has normal mobility.     Left Ear: Hearing and ear canal normal. Tympanic membrane has normal mobility.     Mouth/Throat:     Pharynx: No oropharyngeal exudate.  Eyes:     General: No scleral icterus.    Conjunctiva/sclera: Conjunctivae normal.     Pupils: Pupils are equal, round, and reactive to light.  Neck:     Thyroid: No thyroid mass, thyromegaly or thyroid tenderness.     Vascular: No JVD.  Cardiovascular:     Rate and Rhythm: Normal rate and regular rhythm.     Heart sounds: Normal heart sounds. No murmur. No friction rub. No gallop.   Pulmonary:     Effort: Pulmonary effort is normal. No respiratory distress.     Breath sounds: Normal breath sounds. No wheezing or rales.  Chest:     Chest wall: No tenderness.  Abdominal:     General: Bowel sounds are normal. There is no distension.     Palpations: Abdomen is soft. There is no splenomegaly or mass.     Tenderness: There is no abdominal tenderness. There is no guarding or rebound.     Comments: Left upper AB with superficial mobile lipoma  Musculoskeletal:        General: Normal range of motion.     Cervical back: Normal range of motion and neck supple. No spinous process tenderness or muscular tenderness.  Lymphadenopathy:     Head:     Right side of head: No submental, submandibular, tonsillar, preauricular, posterior auricular or occipital adenopathy.     Left side of head: No submental, submandibular, tonsillar, preauricular, posterior auricular or occipital adenopathy.     Cervical: Cervical adenopathy present.     Right cervical: Posterior  cervical adenopathy present. No superficial or deep cervical adenopathy.    Left cervical: No superficial, deep or posterior cervical adenopathy.     Upper Body:     Right upper body: No supraclavicular or axillary adenopathy.     Left upper body: No supraclavicular or axillary adenopathy.  Skin:    General: Skin is warm and dry.  Neurological:     Mental Status: She is alert and oriented to person, place, and time.     Cranial Nerves: No cranial nerve deficit.     Sensory: No sensory deficit.     Coordination: Coordination normal.  Psychiatric:        Mood and Affect: Mood is depressed.        Speech: Speech normal.        Behavior: Behavior is slowed.        Thought Content: Thought content normal.        Judgment: Judgment normal.        Assessment & Plan:  Erline was seen today for other.  Diagnoses and all orders for this visit:  Major depressive disorder, recurrent episode, moderate ([place]) Continue follow with psych, getting back on [name] has helped some of the symptoms.   Posterior cervical adenopathy -     Serum protein electrophoresis with reflex -     Ambulatory referral to ENT - patient still has palpable adenopathy, will refer to ENT for further evaluation.       The patient was advised to call immediately if she has any concerning symptoms in the interval. The patient voices understanding of current treatment options and is in agreement with the current care plan.The patient knows to call the clinic with any problems, questions or concerns or go to the ER if any further progression of symptoms.

## 2019-12-03 ENCOUNTER — Encounter: Payer: Self-pay | Admitting: Neurology

## 2019-12-03 ENCOUNTER — Other Ambulatory Visit: Payer: Self-pay

## 2019-12-03 ENCOUNTER — Ambulatory Visit: Payer: BC Managed Care – PPO | Admitting: Neurology

## 2019-12-03 VITALS — BP 140/83 | HR 101 | Temp 97.7°F | Ht 61.0 in | Wt 198.0 lb

## 2019-12-03 DIAGNOSIS — F331 Major depressive disorder, recurrent, moderate: Secondary | ICD-10-CM | POA: Insufficient documentation

## 2019-12-03 DIAGNOSIS — G47421 Narcolepsy in conditions classified elsewhere with cataplexy: Secondary | ICD-10-CM

## 2019-12-03 MED ORDER — ARMODAFINIL 250 MG PO TABS: 250.0000 mg | ORAL_TABLET | Freq: Every day | ORAL | 5 refills | Status: DC

## 2019-12-03 MED ORDER — ARIPIPRAZOLE 10 MG PO TABS: ORAL_TABLET | ORAL | 2 refills | Status: DC

## 2019-12-03 NOTE — Progress Notes (Addendum)
SLEEP MEDICINE CLINIC   Provider:  Larey Seat, M D  Primary Care Physician:  Unk Pinto, MD   Referring Provider: Unk Pinto, MD   Chief Complaint  Patient presents with  . Follow-up    pt alone, rm 11. 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 i was able to review her chart and inform her that due to apnea being present we were unable to complete MSLT study to diagnose narcolepsy. we had to treat the apnea first.   . Other    pt states she has been using the machine and she doesn express some difficulty with it. DME [employer]. Her psych MD started her on [name] but this has not been completelty resolving her daytime sleepiness. advised the patient the process of completing a MSLT to confirm narcolepsy and she doesnt feel that she would be able to wean off abilify and other medications for that.    HPI:  Lauren [name] is a 39 y.o. female , was seen here as in a referral from Dr. Melford Aase for a sleep evaluation. 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  [name] underwent a sleep study on 19 January 2018 at the time she was treated with Xanax, Pristiq, Latuda Pravachol and Synthroid.  She also had a BMI of 37 and an Epworth sleepiness scale was endorsed at 11 points.  She experienced temporary spells of weakness and daily hypersomnia.  Her AHI was 3.1 her RDI was 3.1 the REM AHI was 7.5 usually these levels of sleep apnea are not considered clinically significant.  However there was a quite loud primary snoring noted and the patient still had insomnia and hypersomnia concerns.  The patient decided to try out CPAP, and she has been compliant she has been using the machine 27 out of 30 days with a 76% compliance for hours average user time 5 hours 33 minutes [employer] is between 5 and 12 cmH2O with 3 cm EPR and her AHI was reduced to 0.3 which is a  further successful reduction of an already mild apnea it did not have any effect on her sleepiness.  A repeat laboratory test for review with lymphocytic antigen HLA DQ A1 and DQ B1 was obtained on 13 January by Howell Rucks and she to turn positive for both alleles alleles.  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   We discuss the process how to obtain a valid MSLT ;she ried to wean off latuda and failed, she couldn't work , not able to function. She is not sure she likes her CPAP and given the w baseline AHI I am not convinced it can change much. However, the diagnosis of OSA can help [place] to start her on modafinil.  She has been using [name] with Dr Malachy Moan order, she has seen Vicie Mutters, PA.    I will order modafinil or Armodafinil.    I will establish a weaning schedule for her REM supressant medications.   If she cannot use CPAP 75-80% of the time compliantly , it is not worth to continue.       I had the pleasure of meeting with [name] before about 6-1/2 years ago she had undergone a sleep study at [facility] sleep.  She has been followed by Dr. Jannifer Franklin at the time who also ordered a sleep study.  In the meantime she has been followed by her primary care physician, and has seen Dr. Adele Schilder.  She has a diagnosis of hypothyroidism, fibromyalgia, excessive fatigue.  She reports that she had been under more stress at work and at home.  The patient is also a mother of 2 with her youngest son will be 29 years old.  After the birth of either she experienced temporary spells of weakness.  With each of the deliveries she had received magnesium, which can be the most likely cause for perinatal weakness.  Chief complaint according to patient : "depression, leaden fatigue, weight gain, and myalgia" I sometimes feel it coming down on me and I just need to sleep, have taken 2 naps while waiting here."  Sleep habits are as follows: The patient usually goes to bed around 8:30 PM, she  takes Xanax at bedtime and is promptly asleep within 20-30 minutes.  The bedroom is cool, quiet and dark.  She prefers to sleep on her side or prone , and sleeps on one pillow.  I sleeps through the night, wakes up at 6 AM, on weekends she stays much longer in bed -until 10 AM. She estimates 9 hours of sleep on average. She wakes without headaches or dry mouth. She dreams a lot, has one or none bathroom break. Her husband noted her just to have started to snore, and suspected apnea.  She naps frequently in daytime. She works as a Pharmacist, hospital in [employer]. Her naps are 3-5 minutes long.  Excessive daytime sleepiness and fatigue, 3-5 minute power naps, wakes up from her own snoring. Sleep paralysis in the past, none recent. Immediate dreams when napping. She cannot recall cataplectic spells.    Sleep medical history and family sleep history:  Excessive daytime sleepiness and fatigue, 3-5 minute power naps, wakes up from her own snoring. Mother is sleepy, sons are not.   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 Daughters  age 71  and 66. No smoking history, No ETOH, caffeine - two a day.   Review of Systems: Out of a complete 14 system review, the patient complains of only the following symptoms, and all other reviewed systems are negative.  snoring, weight gain, fatigued.   Epworth score  12 on adderall- - but with naps and sleep attacks.  , Fatigue severity score 60  , depression score n/a -   How likely are you to doze in the following situations: 0 = not likely, 1 = slight chance, 2 = moderate chance, 3 = high chance  Sitting and Reading? Watching Television? Sitting inactive in a public place (theater or meeting)? Lying down in the afternoon when circumstances permit? Sitting and talking to someone? Sitting quietly after lunch without alcohol? In a car, while stopped for a few minutes in traffic? As a passenger in a car for an hour without a break?  Total =  18 off adderall if not allowed to nap!      Social History   Socioeconomic History  . Marital status: Married    Spouse name: Not on file  . Number of children: 2  . Years of education: Not on file  . Highest education level: Not on file  Occupational History  . Occupation: Product manager: [employer]  Tobacco Use  . Smoking status: Never Smoker  . Smokeless tobacco: Never Used  Substance and Sexual Activity  . Alcohol use: Yes    Alcohol/week: 0.0 standard  drinks    Comment: occ  . Drug use: No  . Sexual activity: Yes    Partners: Male    Birth control/protection: None, Surgical    Comment: husband has had a vasectomy  Other Topics Concern  . Not on file  Social History Narrative   4 caffeine drinks daily    Social Determinants of Health   Financial Resource Strain:   . Difficulty of Paying Living Expenses: Not on file  Food Insecurity:   . Worried About Charity fundraiser in the Last Year: Not on file  . Ran Out of Food in the Last Year: Not on file  Transportation Needs:   . Lack of Transportation (Medical): Not on file  . Lack of Transportation (Non-Medical): Not on file  Physical Activity:   . Days of Exercise per Week: Not on file  . Minutes of Exercise per Session: Not on file  Stress:   . Feeling of Stress : Not on file  Social Connections:   . Frequency of Communication with Friends and Family: Not on file  . Frequency of Social Gatherings with Friends and Family: Not on file  . Attends Religious Services: Not on file  . Active Member of Clubs or Organizations: Not on file  . Attends Archivist Meetings: Not on file  . Marital Status: Not on file  Intimate Partner Violence:   . Fear of Current or Ex-Partner: Not on file  . Emotionally Abused: Not on file  . Physically Abused: Not on file  . Sexually Abused: Not on file    Family History  Problem Relation Age of Onset  . Hypothyroidism Mother   . Cervical cancer  Mother   . Bipolar disorder Mother   . Diabetes Mother   . Colon polyps Mother   . Bipolar disorder Brother   . Kidney disease Brother   . Alcohol abuse Brother   . Hypothyroidism Daughter   . Heart disease Maternal Grandmother   . Diabetes Maternal Grandmother   . Cancer Maternal Grandmother        SKIN  . Hyperlipidemia Maternal Grandmother   . Hypertension Maternal Grandmother   . Hypothyroidism Maternal Grandmother   . Lung cancer Maternal Grandfather   . Heart disease Maternal Aunt   . Diabetes Maternal Aunt   . Diabetes Cousin   . Colon cancer Neg Hx     Past Medical History:  Diagnosis Date  . Anemia   . Anxiety   . Constipation   . Depression   . Fibromyalgia   . GERD (gastroesophageal reflux disease)   . Hemorrhoids   . HSV-1 infection   . Hypertension   . Hypothyroidism   . OSA on CPAP   . Other abnormal glucose   . Preeclampsia     Past Surgical History:  Procedure Laterality Date  . CHOLECYSTECTOMY  2006  . TONSILLECTOMY    . WISDOM TOOTH EXTRACTION      Current Outpatient Medications  Medication Sig Dispense Refill  . ALPRAZolam (XANAX) 0.5 MG tablet Take 1 tablet (0.5 mg total) by mouth at bedtime as needed. 30 tablet 0  . amphetamine-dextroamphetamine ([name]) 20 MG 24 hr capsule Take 20 mg by mouth daily.    . ARIPiprazole ([name]) 10 MG tablet Take one tablet by mouth daily (Patient taking differently: Take 10 mg by mouth daily. Takes 2.74m tablet daily) 30 tablet 2  . Cholecalciferol (VITAMIN D3) 125 MCG (5000 UT) CAPS Take 1 capsule by mouth  daily.    . Ferrous Sulfate 140 (45 Fe) MG TBCR Take 1 tablet by mouth daily.    Marland Kitchen L-Methylfolate-Algae (DEPLIN 15 PO) Take by mouth.    . levothyroxine (SYNTHROID) 150 MCG tablet TAKE 1 TABLET BY MOUTH EVERY DAY BEFORE BREAKFAST 30 tablet 5  . meclizine (ANTIVERT) 25 MG tablet 1/2-1 pill up to 3 times daily for motion sickness/dizziness 30 tablet 0  . rosuvastatin (CRESTOR) 5 MG tablet Take 1  tablet (5 mg total) by mouth at bedtime. 90 tablet 3  . vitamin E 100 UNIT capsule Take by mouth daily.    Marland Kitchen vortioxetine HBr ([name]) 20 MG TABS tablet Take 20 mg by mouth daily.     No current facility-administered medications for this visit.    Allergies as of 12/03/2019 - Review Complete 12/03/2019  Allergen Reaction Noted  . Lamictal [lamotrigine] Rash 02/11/2016  . Doxycycline Other (See Comments) 09/19/2013  . Effexor [venlafaxine] Other (See Comments) 09/19/2013  . Prednisone Other (See Comments) 09/19/2013  . Savella [milnacipran hcl] Other (See Comments) 09/19/2013  . Prilosec [omeprazole] Itching 11/21/2019    Vitals: BP 140/83   Pulse (!) 101   Temp 97.7 F (36.5 C)   Ht 5' 1"  (1.549 m)   Wt 198 lb (89.8 kg)   BMI 37.41 kg/m  Last Weight:  Wt Readings from Last 1 Encounters:  12/03/19 198 lb (89.8 kg)   TDV:VOHY mass index is 37.41 kg/m.     Last Height:   Ht Readings from Last 1 Encounters:  12/03/19 5' 1"  (1.549 m)    Physical exam:  General: The patient is awake, alert and appears not in acute distress. The patient is well groomed. Head: Normocephalic, atraumatic. Neck is supple. Mallampati 5 and pale mucosa. ,  neck circumference: 15,5 " Nasal airflow patent , Retrognathia is not seen. Wore braces and retainer, now bruxism mouth guard.  Cardiovascular:  Regular rate and rhythm, without  murmurs or carotid bruit, and without distended neck veins. Respiratory: Lungs are clear to auscultation. Skin:  Without evidence of edema, or rash Trunk: BMI is 37 .2 Neurologic exam : The patient is awake and alert, oriented to place and time.     Attention span & concentration ability appears normal.  Speech is slowed but  fluent, meek-  without dysarthria, dysphonia or aphasia.  Mood and affect are appropriate.  Cranial nerves: Intact sense of taste and smell. Pupils are equal and briskly reactive to light. Funduscopic exam without evidence of pallor or  edema. Extraocular movements  in vertical and horizontal planes intact and without nystagmus. Visual fields by finger perimetry are intact. Hearing to finger rub intact. Facial sensation intact to fine touch. Facial motor strength is symmetric and tongue and uvula move midline. Shoulder shrug was symmetrical.  Motor exam:  Normal tone, muscle bulk and symmetric strength in all extremities. Sensory:  Fine touch, pinprick and vibration were tested in all extremities. Proprioception tested in the upper extremities was normal. Coordination: Rapid alternating movements in the fingers/hands was normal. Finger-to-nose maneuver  normal without evidence of ataxia, dysmetria or tremor. Gait and station: Patient walks without assistive device . Turns with 3 Steps.  Deep tendon reflexes: in the  upper and lower extremities are symmetric and intact. Babinski maneuver response is downgoing   Assessment:  After physical and neurologic examination, review of laboratory studies,  Personal review of imaging studies, reports of other /same  Imaging studies, results of polysomnography and / or neurophysiology testing and  pre-existing records as far as provided in visit., my assessment is ;  Mrs. Bonner had a period of insomnia when her husband snored, who now is on CPAP. She has become more fatigued as more she sleeps, and still is excessively daytime sleepy.   1) snoring, mild  OSA -Mrs. Swafford has several risk factors for the presence of obstructive sleep apnea, her neck circumference approaches 16 inches, she has a high-grade narrowing of the upper airway, her mucous lining in her mouth appears pale, she also has bruxism.  She does not present with red prognathia.  Her BMI is above 35 and will be categorized as morbid obesity, a further risk factor for the presence of sleep apnea.    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 Patient will need to undergo MSLT . Her HLA test was bi-allelic positive for 2 alleles .    The patient was advised of the nature of the diagnosed disorder , the treatment options and the  risks for general health and wellness arising from not treating the condition.   I spent more than 25  minutes of face to face time and preparation time with the patient.  Greater than 50% of time was spent in counseling and coordination of care. We have discussed the diagnosis and differential and I answered the patient's questions.    Plan:  Treatment plan and additional workup :  Continue for now CPAP. Modafinil.  MSLT for summer 2021.     Larey Seat, MD 04/08/8241, 9:98 PM  Certified in Neurology by ABPN Certified in [place] by [facility] [address], [place], [place] [address]

## 2019-12-03 NOTE — Patient Instructions (Signed)
In preparation for a MSLT mean sleep latency test to be valid I suggest that the patient goes to [name] 1/2 tablets the equivalent of 2.5 mg alternating with 5 mg for 1 week then goes to the 2.5 mg daily, followed by every other day 2.5 mg after 3 weeks of the exact schedule she should be able to discontinue the [name] for 3 weeks before MSLT.  The same for [name] 10 mg alternating with 5 mg for 1 week then 5 mg daily for 1 week then 5 mg every other day for another week before discontinuing the medication.  [name] can be stopped 7 days prior to the sleep study and does not have to be weaned Deplin does not have to be weaned, Xanax can be stopped 1 week prior and does not have to be on a decreased schedule either.    To help her bridge over I also wrote for modafinil or armodafinil 200 mg daily this is also a medication she can stop 10 to 14 days before a sleep study does not have to wean off however there should be no trace in her system at the time when an MSLT is performed.

## 2019-12-04 ENCOUNTER — Ambulatory Visit (INDEPENDENT_AMBULATORY_CARE_PROVIDER_SITE_OTHER): Payer: BC Managed Care – PPO | Admitting: Physician Assistant

## 2019-12-04 ENCOUNTER — Encounter: Payer: Self-pay | Admitting: Physician Assistant

## 2019-12-04 ENCOUNTER — Other Ambulatory Visit: Payer: BC Managed Care – PPO

## 2019-12-04 ENCOUNTER — Other Ambulatory Visit: Payer: Self-pay

## 2019-12-04 VITALS — BP 130/86 | HR 91 | Temp 97.4°F | Ht 61.0 in | Wt 200.4 lb

## 2019-12-04 DIAGNOSIS — F331 Major depressive disorder, recurrent, moderate: Secondary | ICD-10-CM

## 2019-12-04 DIAGNOSIS — R590 Localized enlarged lymph nodes: Secondary | ICD-10-CM

## 2019-12-04 NOTE — Patient Instructions (Signed)
Lipoma  A lipoma is a noncancerous (benign) tumor that is made up of fat cells. This is a very common type of soft-tissue growth. Lipomas are usually found under the skin (subcutaneous). They may occur in any tissue of the body that contains fat. Common areas for lipomas to appear include the back, arms, shoulders, buttocks, and thighs. Lipomas grow slowly, and they are usually painless. Most lipomas do not cause problems and do not require treatment. What are the causes? The cause of this condition is not known. What increases the risk? You are more likely to develop this condition if:  You are 40-60 years old.  You have a family history of lipomas. What are the signs or symptoms? A lipoma usually appears as a small, round bump under the skin. In most cases, the lump will:  Feel soft or rubbery.  Not cause pain or other symptoms. However, if a lipoma is located in an area where it pushes on nerves, it can become painful or cause other symptoms. How is this diagnosed? A lipoma can usually be diagnosed with a physical exam. You may also have tests to confirm the diagnosis and to rule out other conditions. Tests may include:  Imaging tests, such as a CT scan or an MRI.  Removal of a tissue sample to be looked at under a microscope (biopsy). How is this treated? Treatment for this condition depends on the size of the lipoma and whether it is causing any symptoms.  For small lipomas that are not causing problems, no treatment is needed.  If a lipoma is bigger or it causes problems, surgery may be done to remove the lipoma. Lipomas can also be removed to improve appearance. Most often, the procedure is done after applying a medicine that numbs the area (local anesthetic).  Liposuction may be done to reduce the size of the lipoma before it is removed through surgery, or it may be done to remove the lipoma. Lipomas are removed with this method in order to limit incision size and scarring. A  liposuction tube is inserted through a small incision into the lipoma, and the contents of the lipoma are removed through the tube with suction. Follow these instructions at home:  Watch your lipoma for any changes.  Keep all follow-up visits as told by your health care provider. This is important. Contact a health care provider if:  Your lipoma becomes larger or hard.  Your lipoma becomes painful, red, or increasingly swollen. These could be signs of infection or a more serious condition. Get help right away if:  You develop tingling or numbness in an area near the lipoma. This could indicate that the lipoma is causing nerve damage. Summary  A lipoma is a noncancerous tumor that is made up of fat cells.  Most lipomas do not cause problems and do not require treatment.  If a lipoma is bigger or it causes problems, surgery may be done to remove the lipoma.  Contact a health care provider if your lipoma becomes larger or hard, or if it becomes painful, red, or increasingly swollen. Pain, redness, and swelling could be signs of infection or a more serious condition. This information is not intended to replace advice given to you by your health care provider. Make sure you discuss any questions you have with your health care provider. Document Revised: 06/24/2019 Document Reviewed: 06/24/2019 Elsevier Patient Education  2020 [employer]  

## 2019-12-05 ENCOUNTER — Telehealth: Payer: Self-pay | Admitting: Neurology

## 2019-12-05 NOTE — Telephone Encounter (Signed)
PA completed through cover my meds/[facility]. [id number] Will wait for response

## 2019-12-05 NOTE — Telephone Encounter (Signed)
PA approved for the patient until 12/04/2020 through [facility]

## 2019-12-06 ENCOUNTER — Encounter: Payer: Self-pay | Admitting: Adult Health

## 2019-12-06 ENCOUNTER — Other Ambulatory Visit: Payer: Self-pay

## 2019-12-06 ENCOUNTER — Other Ambulatory Visit: Payer: Self-pay | Admitting: Adult Health

## 2019-12-06 ENCOUNTER — Ambulatory Visit: Payer: BC Managed Care – PPO | Admitting: Adult Health

## 2019-12-06 DIAGNOSIS — N898 Other specified noninflammatory disorders of vagina: Secondary | ICD-10-CM

## 2019-12-06 DIAGNOSIS — R8290 Unspecified abnormal findings in urine: Secondary | ICD-10-CM

## 2019-12-06 DIAGNOSIS — N949 Unspecified condition associated with female genital organs and menstrual cycle: Secondary | ICD-10-CM

## 2019-12-06 LAB — PROTEIN ELECTROPHORESIS, SERUM, WITH REFLEX
Albumin ELP: 4.1 g/dL (ref 3.8–4.8)
Alpha 1: 0.3 g/dL (ref 0.2–0.3)
Alpha 2: 0.8 g/dL (ref 0.5–0.9)
Beta 2: 0.3 g/dL (ref 0.2–0.5)
Beta Globulin: 0.5 g/dL (ref 0.4–0.6)
Gamma Globulin: 0.9 g/dL (ref 0.8–1.7)
Total Protein: 6.9 g/dL (ref 6.1–8.1)

## 2019-12-06 NOTE — Progress Notes (Signed)
Virtual Visit via Telephone Note  I connected with Lauren [name] on 12/06/19 at 08:45 AM by telephone and verified that I am speaking with the correct person using two identifiers.  Location: Patient: work Provider: [name] office   I discussed the limitations, risks, security and privacy concerns of performing an evaluation and management service by telephone and the availability of in person appointments. I also discussed with the patient that there may be a patient responsible charge related to this service. The patient expressed understanding and agreed to proceed.   History of Present Illness:  BP 124/88   Pulse 98   Temp 97.9 F (36.6 C)   Wt 200 lb (90.7 kg)   SpO2 99%   BMI 37.79 kg/m   39 y.o. female with hx of morbid obesity, abnormal glucose, depression, fibromyalgia, sleep disorders messaged to report new onset very foul odor of urine x 2 days and was called to evaluate further. She was unanble to present for evaluation during typical business hours and consented to telephone and lab evaluation.   She reports yesterday noted some strong urine odor, otherwise no other symptoms. This AM she reports urine odor was worse, quite severe "like a dirty diaper." She reports some sediment but typical for her in AM; otherwise yellow, non-cloudy, no blood. She denies dysuria, but does endorse has had some perineal burning intermittently and a sense of feeling "bruised" in that area. She does report mild intermittent vaginal discharge over the last month, small amount, white/pale yellow color. She is married, no new sexual partners, denies hx of STIs. She does endorse hx of recurrent BV.   She denies recent pain with intercourse, abdominal/pelvic pain, n/v/c/d, flank pain; she does endorse some lower back pain at night when lying down. She denies dysuria, urgency, frequency. She has not tried any interventions for her symptoms.   She has notably been seen recently in office by  another provider for ? Post cervical adenopathy, had CBC, CMP, ESR which were essentially normal other than elevated serum glucose (218); she did complete a course of azithromycin   Current Outpatient Medications on File Prior to Visit  Medication Sig Dispense Refill  . ALPRAZolam (XANAX) 0.5 MG tablet Take 1 tablet (0.5 mg total) by mouth at bedtime as needed. 30 tablet 0  . amphetamine-dextroamphetamine ([name]) 20 MG 24 hr capsule Take 20 mg by mouth daily.    . ARIPiprazole ([name]) 10 MG tablet Take one half tablet by mouth daily, 5 mg daily 30 tablet 2  . Armodafinil 250 MG tablet Take 1 tablet (250 mg total) by mouth daily. 30 tablet 5  . Cholecalciferol (VITAMIN D3) 125 MCG (5000 UT) CAPS Take 1 capsule by mouth daily.    . Ferrous Sulfate 140 (45 Fe) MG TBCR Take 1 tablet by mouth daily.    Marland Kitchen L-Methylfolate-Algae (DEPLIN 15 PO) Take by mouth.    . levothyroxine (SYNTHROID) 150 MCG tablet TAKE 1 TABLET BY MOUTH EVERY DAY BEFORE BREAKFAST 30 tablet 5  . meclizine (ANTIVERT) 25 MG tablet 1/2-1 pill up to 3 times daily for motion sickness/dizziness 30 tablet 0  . rosuvastatin (CRESTOR) 5 MG tablet Take 1 tablet (5 mg total) by mouth at bedtime. 90 tablet 3  . vitamin E 100 UNIT capsule Take by mouth daily.    Marland Kitchen vortioxetine HBr ([name]) 20 MG TABS tablet Take 20 mg by mouth daily.     No current facility-administered medications on file prior to visit.  Allergies:  Allergies  Allergen Reactions  . Lamictal [Lamotrigine] Rash  . Doxycycline Other (See Comments)    GI UPSET  . Effexor [Venlafaxine] Other (See Comments)    DYSPHORIA  . Prednisone Other (See Comments)    FLUSHED  . Savella [Milnacipran Hcl] Other (See Comments)    NO RELIEF  . 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 Surgical History:  She  has a past surgical history that includes Cholecystectomy (2006); Tonsillectomy; and Wisdom tooth extraction. Family History:  [name] history includes Alcohol abuse in her brother; Bipolar disorder in her brother and mother; Cancer in her maternal grandmother; Cervical cancer in her mother; Colon polyps in her mother; Diabetes in her cousin, maternal aunt, maternal grandmother, and mother; Heart disease in her maternal aunt and maternal grandmother; Hyperlipidemia in her maternal grandmother; Hypertension in her maternal grandmother; Hypothyroidism in her daughter, maternal grandmother, and mother; Kidney disease in her brother; Lung cancer in her maternal grandfather. Social History:   reports that she has never smoked. She has never used smokeless tobacco. She reports current alcohol use. She reports that she does not use drugs.     Observations/Objective:  General : Well sounding patient in no apparent distress HEENT: no hoarseness, no cough for duration of visit Lungs: speaks in complete sentences, no audible wheezing, no apparent distress Neurological: alert, oriented x 3 Psychiatric: pleasant, judgement appropriate   Assessment and Plan:  Lauren [name] was seen today for vaginal discharge.  Diagnoses and all orders for this visit:  Perineal discomfort in female Vaginal discharge Bad odor of urine Unable to be seen by provider but patient was able to come in for labs and VS  Will check UA and she provided a self swab for wet prep to evaluate for  UTI, BV, yeast No STI hx, no known new exposures though consider GC/Chl if below is negative  Will treat as indicated pending results; if negative but persistent symptoms advised she will need to be seen by provider for pelvic exam  Push fluids, avoid intercourse at this time until results and recommended treatments are complete Please go to the ER if you have any severe AB or flank pain, unable to hold down food/water, progressive fever/chills, confusion, large quantities of blood in urine.  -     Urinalysis w microscopic + reflex cultur -     WET PREP BY MOLECULAR PROB  Follow Up Instructions:  I discussed the assessment and treatment plan with the patient. The patient was provided an opportunity to ask questions and all were answered. The patient agreed with the plan and demonstrated an understanding of the instructions.   The patient was advised to call back or seek an in-person evaluation if the symptoms worsen or if the condition fails to improve as anticipated.  I provided 12 minutes of non-face-to-face time during this encounter.   Izora Ribas, NP

## 2019-12-09 ENCOUNTER — Encounter: Payer: Self-pay | Admitting: Neurology

## 2019-12-09 ENCOUNTER — Other Ambulatory Visit: Payer: Self-pay | Admitting: Adult Health

## 2019-12-09 LAB — URINALYSIS W MICROSCOPIC + REFLEX CULTURE
Bilirubin Urine: NEGATIVE
Hgb urine dipstick: NEGATIVE
Hyaline Cast: NONE SEEN /LPF
Ketones, ur: NEGATIVE
Nitrites, Initial: NEGATIVE
Protein, ur: NEGATIVE
RBC / HPF: NONE SEEN /HPF (ref 0–2)
Specific Gravity, Urine: 1.015 (ref 1.001–1.03)
pH: 7 (ref 5.0–8.0)

## 2019-12-09 LAB — WET PREP BY MOLECULAR PROBE
Candida species: NOT DETECTED
Gardnerella vaginalis: NOT DETECTED
MICRO NUMBER:: 10048212
SPECIMEN QUALITY:: ADEQUATE
Trichomonas vaginosis: NOT DETECTED

## 2019-12-09 LAB — CULTURE INDICATED

## 2019-12-09 LAB — URINE CULTURE
MICRO NUMBER:: 10049201
SPECIMEN QUALITY:: ADEQUATE

## 2019-12-09 MED ORDER — SULFAMETHOXAZOLE-TRIMETHOPRIM 800-160 MG PO TABS: 1.0000 | ORAL_TABLET | Freq: Two times a day (BID) | ORAL | 0 refills | Status: DC

## 2019-12-09 MED ORDER — TRIAMCINOLONE ACETONIDE 0.1 % EX OINT: TOPICAL_OINTMENT | CUTANEOUS | 0 refills | Status: DC

## 2019-12-16 ENCOUNTER — Other Ambulatory Visit: Payer: Self-pay | Admitting: Physician Assistant

## 2019-12-17 ENCOUNTER — Ambulatory Visit (INDEPENDENT_AMBULATORY_CARE_PROVIDER_SITE_OTHER): Payer: BC Managed Care – PPO | Admitting: Otolaryngology

## 2019-12-17 ENCOUNTER — Other Ambulatory Visit: Payer: Self-pay

## 2019-12-17 ENCOUNTER — Encounter (INDEPENDENT_AMBULATORY_CARE_PROVIDER_SITE_OTHER): Payer: Self-pay | Admitting: Otolaryngology

## 2019-12-17 VITALS — Temp 97.7°F

## 2019-12-17 DIAGNOSIS — R591 Generalized enlarged lymph nodes: Secondary | ICD-10-CM | POA: Diagnosis not present

## 2019-12-17 NOTE — Progress Notes (Signed)
HPI: Lauren [name] is a 39 y.o. female who presents is referred by Vicie Mutters, PA for evaluation of right neck node as well as complaints of some pain more posteriorly in the neck that radiates down into her shoulders.  She recently saw someone for a massage and this caused a lot of pain when they were massaging back in the neck. She first noticed these neck node back in November.  It has not been tender and has not significantly enlarged over the past 2 months. She recently underwent ultrasound which did not identify any significant adenopathy. Denies any sore throat or trouble swallowing.  No ear problems..  Past Medical History:  Diagnosis Date  . Anemia   . Anxiety   . Constipation   . Depression   . Fibromyalgia   . GERD (gastroesophageal reflux disease)   . Hemorrhoids   . HSV-1 infection   . Hypertension   . Hypothyroidism   . OSA on CPAP   . Other abnormal glucose   . Preeclampsia    Past Surgical History:  Procedure Laterality Date  . CHOLECYSTECTOMY  2006  . TONSILLECTOMY    . WISDOM TOOTH EXTRACTION     Social History   Socioeconomic History  . Marital status: Married    Spouse name: Not on file  . Number of children: 2  . Years of education: Not on file  . Highest education level: Not on file  Occupational History  . Occupation: Product manager: [employer]  Tobacco Use  . Smoking status: Never Smoker  . Smokeless tobacco: Never Used  Substance and Sexual Activity  . Alcohol use: Yes    Alcohol/week: 0.0 standard drinks    Comment: occ  . Drug use: No  . Sexual activity: Yes    Partners: Male    Birth control/protection: None, Surgical    Comment: husband has had a vasectomy  Other Topics Concern  . Not on file  Social History Narrative   4 caffeine drinks daily    Social Determinants of Health   Financial Resource Strain:   . Difficulty of Paying Living Expenses: Not on file  Food Insecurity:   . Worried About  Charity fundraiser in the Last Year: Not on file  . Ran Out of Food in the Last Year: Not on file  Transportation Needs:   . Lack of Transportation (Medical): Not on file  . Lack of Transportation (Non-Medical): Not on file  Physical Activity:   . Days of Exercise per Week: Not on file  . Minutes of Exercise per Session: Not on file  Stress:   . Feeling of Stress : Not on file  Social Connections:   . Frequency of Communication with Friends and Family: Not on file  . Frequency of Social Gatherings with Friends and Family: Not on file  . Attends Religious Services: Not on file  . Active Member of Clubs or Organizations: Not on file  . Attends Archivist Meetings: Not on file  . Marital Status: Not on file   Family History  Problem Relation Age of Onset  . Hypothyroidism Mother   . Cervical cancer Mother   . Bipolar disorder Mother   . Diabetes Mother   . Colon polyps Mother   . Bipolar disorder Brother   . Kidney disease Brother   . Alcohol abuse Brother   . Hypothyroidism Daughter   . Heart disease Maternal Grandmother   . Diabetes Maternal Grandmother   .  Cancer Maternal Grandmother        SKIN  . Hyperlipidemia Maternal Grandmother   . Hypertension Maternal Grandmother   . Hypothyroidism Maternal Grandmother   . Lung cancer Maternal Grandfather   . Heart disease Maternal Aunt   . Diabetes Maternal Aunt   . Diabetes Cousin   . Colon cancer Neg Hx    Allergies  Allergen Reactions  . Lamictal [Lamotrigine] Rash  . Doxycycline Other (See Comments)    GI UPSET  . Effexor [Venlafaxine] Other (See Comments)    DYSPHORIA  . Prednisone Other (See Comments)    FLUSHED  . Savella [Milnacipran Hcl] Other (See Comments)    NO RELIEF  . Prilosec [Omeprazole] Itching   Prior to Admission medications   Medication Sig Start Date End Date Taking? Authorizing Provider  ALPRAZolam Prudy Feeler) 0.5 MG tablet Take 1 tablet (0.5 mg total) by mouth at bedtime as needed.  10/21/19  Yes Quentin Mulling, PA-C  amphetamine-dextroamphetamine ([name]) 20 MG 24 hr capsule Take 20 mg by mouth daily.   Yes [provider]  ARIPiprazole ([name]) 10 MG tablet Take one half tablet by mouth daily, 5 mg daily 12/03/19  Yes Dohmeier, Porfirio Mylar, MD  Armodafinil 250 MG tablet Take 1 tablet (250 mg total) by mouth daily. 12/03/19  Yes Dohmeier, Porfirio Mylar, MD  Cholecalciferol (VITAMIN D3) 125 MCG (5000 UT) CAPS Take 1 capsule by mouth daily.   Yes [provider]  Ferrous Sulfate 140 (45 Fe) MG TBCR Take 1 tablet by mouth daily.   Yes [provider]  L-Methylfolate-Algae (DEPLIN 15 PO) Take by mouth.   Yes [provider]  levothyroxine (SYNTHROID) 150 MCG tablet TAKE 1 TABLET BY MOUTH EVERY DAY BEFORE BREAKFAST 12/16/19  Yes Quentin Mulling, PA-C  meclizine (ANTIVERT) 25 MG tablet 1/2-1 pill up to 3 times daily for motion sickness/dizziness 11/21/19  Yes Quentin Mulling, PA-C  rosuvastatin (CRESTOR) 5 MG tablet Take 1 tablet (5 mg total) by mouth at bedtime. 07/22/19 07/21/20 Yes Quentin Mulling, PA-C  sulfamethoxazole-trimethoprim (BACTRIM DS) 800-160 MG tablet Take 1 tablet by mouth 2 (two) times daily. 12/09/19  Yes Judd Gaudier, NP  triamcinolone ointment (KENALOG) 0.1 % Apply a small amount sparingly topically to area of discomfort two to three times a day as needed for 10-14 days. 12/09/19  Yes Judd Gaudier, NP  vitamin E 100 UNIT capsule Take by mouth daily.   Yes [provider]  vortioxetine HBr ([name]) 20 MG TABS tablet Take 20 mg by mouth daily.   Yes [provider]     Positive ROS: Otherwise negative  All other systems have been reviewed and were otherwise negative with the exception of those mentioned in the HPI and as above.  Physical Exam: Constitutional: Alert, well-appearing, no acute distress Ears: External ears without lesions or tenderness. Ear canals are clear bilaterally with intact, clear TMs.   Nasal: External nose without lesions. Septum with minimal deformity.. Clear nasal passages.  No signs of infection Oral: Lips and gums without lesions. Tongue and palate mucosa without lesions. Posterior oropharynx clear.  Tonsil regions appear benign bilaterally. Neck: Patient has a fairly easily palpable lymph node in the posterior right neck along the accessory chain of lymph nodes measures approximately 8 mm in size and is mobile it is consistent with benign lymphadenopathy.  No palpable adenopathy or masses in the posterior neck.  No palpable jugular lymphadenopathy on either side. Respiratory: Breathing comfortably  Skin: No facial/neck lesions or rash noted.  Procedures  Assessment: Right posterior neck node consistent with benign reactive lymphadenopathy Neck pain and discomfort is probably more musculoskeletal.  Plan: Reviewed with patient concerning right posterior neck lymphadenopathy that she is feeling.  If this enlarges she will follow-up for recheck as needed.  Otherwise reviewed with her concerning normal upper airway examination and that this is most consistent with reactive lymphadenopathy For the neck discomfort recommended warm compresses in use of NSAIDs.  If this worsens would recommend further evaluation with Ortho or neurosurgery concerning further evaluation of the neck.   Narda Bonds, MD   CC:

## 2019-12-18 ENCOUNTER — Ambulatory Visit: Payer: BC Managed Care – PPO

## 2020-01-01 ENCOUNTER — Encounter: Payer: Self-pay | Admitting: Adult Health

## 2020-01-01 ENCOUNTER — Ambulatory Visit (INDEPENDENT_AMBULATORY_CARE_PROVIDER_SITE_OTHER): Payer: BC Managed Care – PPO | Admitting: Adult Health

## 2020-01-01 ENCOUNTER — Other Ambulatory Visit: Payer: Self-pay

## 2020-01-01 VITALS — BP 124/82 | HR 89 | Temp 97.5°F | Wt 202.6 lb

## 2020-01-01 DIAGNOSIS — G5601 Carpal tunnel syndrome, right upper limb: Secondary | ICD-10-CM

## 2020-01-01 MED ORDER — METHYLPREDNISOLONE 4 MG PO TBPK: ORAL_TABLET | ORAL | 0 refills | Status: DC

## 2020-01-01 NOTE — Progress Notes (Signed)
Assessment and Plan:  Lauren [name] was seen today for hand problem.  Diagnoses and all orders for this visit:  Right carpal tunnel syndrome Hx of recurrent carpal tunnel; + R phalen's Wear brace/splint during day, look into ergonomic keyboard, avoid aggravating activities when possible Good response last year with medrol dosepak per Dr. Cleophas Dunker; will repeat If not resolving as expected recommend follow up with Dr. Cleophas Dunker who suggested EMGs and nerve conduction studies  -     methylPREDNISolone (MEDROL DOSEPAK) 4 MG TBPK tablet; Take as prescribed on package.  Further disposition pending results of labs. Discussed med's effects and SE's.   Over 15 minutes of exam, counseling, chart review, and critical decision making was performed.   Future Appointments  Date Time Provider Department Center  01/06/2020  3:50 PM Lazaro Arms, MD [facility]  04/01/2020  3:30 PM Dohmeier, Porfirio Mylar, MD [facility] None  04/15/2020  3:00 PM Quentin Mulling, PA-C [facility] None    ------------------------------------------------------------------------------------------------------------------   HPI BP 124/82   Pulse 89   Temp (!) 97.5 F (36.4 C)   Wt 202 lb 9.6 oz (91.9 kg)   SpO2 99%   BMI 38.28 kg/m   39 y.o.female R handed female with hx of bilateral carpal tunnel, fibromyalgia presents for evaluation due to 4 weeks of R hand intermittent aching/stiffness, 2 weeks of intermittent tingling in digits 1-4.   She reports wakes up with hand feeling normal, as day progresses has increasing sensation of aching/disfomfort, stiffness; hx of bil carpal tunnel, started wearing brace with no improvement, but started having intermittent tingling/numbness towards the end of the day. 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 Denies any symptoms at night. Denies neck pain, shoulder, elbow pain. Does endorse sense of weakness in hand at end of the day. Denies injury. Hasn't tried OTC medications.    Saw Dr. Cleophas Dunker in 05/2019, was prescribed medrol dose pack which she reports resolved her symptoms.   Past Medical History:  Diagnosis Date  . Anemia   . Anxiety   . Constipation   . Depression   . Fibromyalgia   . GERD (gastroesophageal reflux disease)   . Hemorrhoids   . HSV-1 infection   . Hypertension   . Hypothyroidism   . OSA on CPAP   . Other abnormal glucose   . Preeclampsia      Allergies  Allergen Reactions  . Lamictal [Lamotrigine] Rash  . Doxycycline Other (See Comments)    GI UPSET  . Effexor [Venlafaxine] Other (See Comments)    DYSPHORIA  . Prednisone Other (See Comments)    FLUSHED  . Savella [Milnacipran Hcl] Other (See Comments)    NO RELIEF  . Prilosec [Omeprazole] Itching    Current Outpatient Medications on File Prior to Visit  Medication Sig  . ALPRAZolam (XANAX) 0.5 MG tablet Take 1 tablet (0.5 mg total) by mouth at bedtime as needed.  Marland Kitchen amphetamine-dextroamphetamine ([name]) 20 MG 24 hr capsule Take 20 mg by mouth daily.  . ARIPiprazole ([name]) 10 MG tablet Take one half tablet by mouth daily, 5 mg daily  . Armodafinil 250 MG tablet Take 1 tablet (250 mg total) by mouth daily.  . Cholecalciferol (VITAMIN D3) 125 MCG (5000 UT) CAPS Take 1 capsule by mouth daily.  . Ferrous Sulfate 140 (45 Fe) MG TBCR Take 1 tablet by mouth daily.  Marland Kitchen L-Methylfolate-Algae (DEPLIN 15 PO) Take by mouth.  . levothyroxine (SYNTHROID) 150 MCG tablet TAKE 1 TABLET BY MOUTH EVERY DAY  BEFORE BREAKFAST  . meclizine (ANTIVERT) 25 MG tablet 1/2-1 pill up to 3 times daily for motion sickness/dizziness  . rosuvastatin (CRESTOR) 5 MG tablet Take 1 tablet (5 mg total) by mouth at bedtime.  . triamcinolone ointment (KENALOG) 0.1 % Apply a small amount sparingly topically to area of discomfort two to three times a day as needed for 10-14 days.  . vitamin E 100 UNIT capsule Take by mouth daily.  Marland Kitchen vortioxetine HBr ([name]) 20 MG TABS tablet Take 20 mg by mouth daily.   Marland Kitchen sulfamethoxazole-trimethoprim (BACTRIM DS) 800-160 MG tablet Take 1 tablet by mouth 2 (two) times daily.   No current facility-administered medications on file prior to visit.    ROS: all negative except above.   Physical Exam:  BP 124/82   Pulse 89   Temp (!) 97.5 F (36.4 C)   Wt 202 lb 9.6 oz (91.9 kg)   SpO2 99%   BMI 38.28 kg/m   General Appearance: Well nourished, in no apparent distress. Eyes: PERRLA, conjunctiva no swelling or erythema ENT/Mouth: Mask in place; Hearing normal.  Neck: Supple Respiratory: Respiratory effort normal, BS equal bilaterally without rales, rhonchi, wheezing or stridor.  Cardio: RRR with no MRGs. Brisk peripheral pulses without edema. Good capillary refill bil hands, warm to touch.  Musculoskeletal: Full ROM, 5/5 strength, normal gait. No palpable bony abnormality, cyst of R hand; grip symmetrical. Distal sensation intact. + phalen's of R  Skin: Warm, dry without rashes, lesions, ecchymosis.  Neuro: Cranial nerves intact. Normal muscle tone, no cerebellar symptoms. Sensation intact.  Psych: Awake and oriented X 3, normal affect, Insight and Judgment appropriate.     Izora Ribas, NP 4:45 PM [facility]

## 2020-01-01 NOTE — Patient Instructions (Signed)
Carpal Tunnel Syndrome  Carpal tunnel syndrome is a condition that causes pain in your hand and arm. The carpal tunnel is a narrow area located on the palm side of your wrist. Repeated wrist motion or certain diseases may cause swelling within the tunnel. This swelling pinches the main nerve in the wrist (median nerve). What are the causes? This condition may be caused by:  Repeated wrist motions.  Wrist injuries.  Arthritis.  A cyst or tumor in the carpal tunnel.  Fluid buildup during pregnancy. Sometimes the cause of this condition is not known. What increases the risk? The following factors may make you more likely to develop this condition:  Having a job, such as being a butcher or a cashier, that requires you to repeatedly move your wrist in the same motion.  Being a woman.  Having certain conditions, such as: ? Diabetes. ? Obesity. ? An underactive thyroid (hypothyroidism). ? Kidney failure. What are the signs or symptoms? Symptoms of this condition include:  A tingling feeling in your fingers, especially in your thumb, index, and middle fingers.  Tingling or numbness in your hand.  An aching feeling in your entire arm, especially when your wrist and elbow are bent for a long time.  Wrist pain that goes up your arm to your shoulder.  Pain that goes down into your palm or fingers.  A weak feeling in your hands. You may have trouble grabbing and holding items. Your symptoms may feel worse during the night. How is this diagnosed? This condition is diagnosed with a medical history and physical exam. You may also have tests, including:  Electromyogram (EMG). This test measures electrical signals sent by your nerves into the muscles.  Nerve conduction study. This test measures how well electrical signals pass through your nerves.  Imaging tests, such as X-rays, ultrasound, and MRI. These tests check for possible causes of your condition. How is this treated? This  condition may be treated with:  Lifestyle changes. It is important to stop or change the activity that caused your condition.  Doing exercise and activities to strengthen your muscles and bones (physical therapy).  Learning how to use your hand again after diagnosis (occupational therapy).  Medicines for pain and inflammation. This may include medicine that is injected into your wrist.  A wrist splint.  Surgery. Follow these instructions at home: If you have a splint:  Wear the splint as told by your health care provider. Remove it only as told by your health care provider.  Loosen the splint if your fingers tingle, become numb, or turn cold and blue.  Keep the splint clean.  If the splint is not waterproof: ? Do not let it get wet. ? Cover it with a watertight covering when you take a bath or shower. Managing pain, stiffness, and swelling   If directed, put ice on the painful area: ? If you have a removable splint, remove it as told by your health care provider. ? Put ice in a plastic bag. ? Place a towel between your skin and the bag. ? Leave the ice on for 20 minutes, 2-3 times per day. General instructions  Take over-the-counter and prescription medicines only as told by your health care provider.  Rest your wrist from any activity that may be causing your pain. If your condition is work related, talk with your employer about changes that can be made, such as getting a wrist pad to use while typing.  Do any exercises as told   by your health care provider, physical therapist, or occupational therapist.  Keep all follow-up visits as told by your health care provider. This is important. Contact a health care provider if:  You have new symptoms.  Your pain is not controlled with medicines.  Your symptoms get worse. Get help right away if:  You have severe numbness or tingling in your wrist or hand. Summary  Carpal tunnel syndrome is a condition that causes pain in  your hand and arm.  It is usually caused by repeated wrist motions.  Lifestyle changes and medicines are used to treat carpal tunnel syndrome. Surgery may be recommended.  Follow your health care provider's instructions about wearing a splint, resting from activity, keeping follow-up visits, and calling for help. This information is not intended to replace advice given to you by your health care provider. Make sure you discuss any questions you have with your health care provider. Document Revised: 03/16/2018 Document Reviewed: 03/16/2018 Elsevier Patient Education  2020 [employer]  

## 2020-01-06 ENCOUNTER — Encounter: Payer: Self-pay | Admitting: Obstetrics & Gynecology

## 2020-01-06 ENCOUNTER — Other Ambulatory Visit: Payer: Self-pay

## 2020-01-06 ENCOUNTER — Ambulatory Visit: Payer: BC Managed Care – PPO | Admitting: Obstetrics & Gynecology

## 2020-01-06 VITALS — BP 133/91 | HR 83 | Ht 61.0 in | Wt 202.0 lb

## 2020-01-06 DIAGNOSIS — N898 Other specified noninflammatory disorders of vagina: Secondary | ICD-10-CM | POA: Diagnosis not present

## 2020-01-06 NOTE — Progress Notes (Signed)
Chief Complaint  Patient presents with  . Gynecologic Exam    feels a popping sensation and then underwear are wet.       39 y.o. G2P2 Patient's last menstrual period was 12/09/2019. The current method of family planning is vasectomy.  Outpatient Encounter Medications as of 01/06/2020  Medication Sig  . ALPRAZolam (XANAX) 0.5 MG tablet Take 1 tablet (0.5 mg total) by mouth at bedtime as needed.  Marland Kitchen amphetamine-dextroamphetamine ([name]) 20 MG 24 hr capsule Take 20 mg by mouth daily.  . ARIPiprazole ([name]) 10 MG tablet Take one half tablet by mouth daily, 5 mg daily  . Armodafinil 250 MG tablet Take 1 tablet (250 mg total) by mouth daily.  . Cholecalciferol (VITAMIN D3) 125 MCG (5000 UT) CAPS Take 1 capsule by mouth daily.  . Ferrous Sulfate 140 (45 Fe) MG TBCR Take 1 tablet by mouth daily.  Marland Kitchen L-Methylfolate-Algae (DEPLIN 15 PO) Take by mouth.  . levothyroxine (SYNTHROID) 150 MCG tablet TAKE 1 TABLET BY MOUTH EVERY DAY BEFORE BREAKFAST  . meclizine (ANTIVERT) 25 MG tablet 1/2-1 pill up to 3 times daily for motion sickness/dizziness  . methylPREDNISolone (MEDROL DOSEPAK) 4 MG TBPK tablet Take as prescribed on package.  . rosuvastatin (CRESTOR) 5 MG tablet Take 1 tablet (5 mg total) by mouth at bedtime.  . triamcinolone ointment (KENALOG) 0.1 % Apply a small amount sparingly topically to area of discomfort two to three times a day as needed for 10-14 days.  . vitamin E 100 UNIT capsule Take by mouth daily.  Marland Kitchen vortioxetine HBr ([name]) 20 MG TABS tablet Take 20 mg by mouth daily.   No facility-administered encounter medications on file as of 01/06/2020.    Subjective Pt has had episodic 2 x per week instances of feeling a "pop" sensation then just a litlle fluid/water in panties.  Then that's it No pain No swelling No unusual bleeding associated Has happened during intercourse as well  Pt has a long history of experiencing Skene's gland emptying(sqyuirting) during  orgasms Past Medical History:  Diagnosis Date  . Anemia   . Anxiety   . Constipation   . Depression   . Fibromyalgia   . GERD (gastroesophageal reflux disease)   . Hemorrhoids   . HSV-1 infection   . Hypertension   . Hypothyroidism   . OSA on CPAP   . Other abnormal glucose   . Preeclampsia     Past Surgical History:  Procedure Laterality Date  . CHOLECYSTECTOMY  2006  . TONSILLECTOMY    . WISDOM TOOTH EXTRACTION      OB History    Gravida  2   Para  2   Term      Preterm      AB      Living  2     SAB      TAB      Ectopic      Multiple      Live Births              Allergies  Allergen Reactions  . Lamictal [Lamotrigine] Rash  . Doxycycline Other (See Comments)    GI UPSET  . Effexor [Venlafaxine] Other (See Comments)    DYSPHORIA  . Savella [Milnacipran Hcl] Other (See Comments)    NO RELIEF  . Prilosec [Omeprazole] Itching    Social History   Socioeconomic History  . Marital status: Married    Spouse name: Not on file  .  Number of children: 2  . Years of education: Not on file  . Highest education level: Not on file  Occupational History  . Occupation: Magazine features editor: [employer]  Tobacco Use  . Smoking status: Never Smoker  . Smokeless tobacco: Never Used  Substance and Sexual Activity  . Alcohol use: Yes    Alcohol/week: 0.0 standard drinks    Comment: occ  . Drug use: No  . Sexual activity: Yes    Partners: Male    Birth control/protection: None, Surgical    Comment: husband has had a vasectomy  Other Topics Concern  . Not on file  Social History Narrative   4 caffeine drinks daily    Social Determinants of Health   Financial Resource Strain:   . Difficulty of Paying Living Expenses: Not on file  Food Insecurity:   . Worried About Programme researcher, broadcasting/film/video in the Last Year: Not on file  . Ran Out of Food in the Last Year: Not on file  Transportation Needs:   . Lack of Transportation (Medical): Not  on file  . Lack of Transportation (Non-Medical): Not on file  Physical Activity:   . Days of Exercise per Week: Not on file  . Minutes of Exercise per Session: Not on file  Stress:   . Feeling of Stress : Not on file  Social Connections:   . Frequency of Communication with Friends and Family: Not on file  . Frequency of Social Gatherings with Friends and Family: Not on file  . Attends Religious Services: Not on file  . Active Member of Clubs or Organizations: Not on file  . Attends Banker Meetings: Not on file  . Marital Status: Not on file    Family History  Problem Relation Age of Onset  . Hypothyroidism Mother   . Cervical cancer Mother   . Bipolar disorder Mother   . Diabetes Mother   . Colon polyps Mother   . Bipolar disorder Brother   . Kidney disease Brother   . Alcohol abuse Brother   . Hypothyroidism Daughter   . Heart disease Maternal Grandmother   . Diabetes Maternal Grandmother   . Cancer Maternal Grandmother        SKIN  . Hyperlipidemia Maternal Grandmother   . Hypertension Maternal Grandmother   . Hypothyroidism Maternal Grandmother   . Lung cancer Maternal Grandfather   . Heart disease Maternal Aunt   . Diabetes Maternal Aunt   . Diabetes Cousin   . Colon cancer Neg Hx     Medications:       Current Outpatient Medications:  .  ALPRAZolam (XANAX) 0.5 MG tablet, Take 1 tablet (0.5 mg total) by mouth at bedtime as needed., Disp: 30 tablet, Rfl: 0 .  amphetamine-dextroamphetamine ([name]) 20 MG 24 hr capsule, Take 20 mg by mouth daily., Disp: , Rfl:  .  ARIPiprazole ([name]) 10 MG tablet, Take one half tablet by mouth daily, 5 mg daily, Disp: 30 tablet, Rfl: 2 .  Armodafinil 250 MG tablet, Take 1 tablet (250 mg total) by mouth daily., Disp: 30 tablet, Rfl: 5 .  Cholecalciferol (VITAMIN D3) 125 MCG (5000 UT) CAPS, Take 1 capsule by mouth daily., Disp: , Rfl:  .  Ferrous Sulfate 140 (45 Fe) MG TBCR, Take 1 tablet by mouth daily., Disp: ,  Rfl:  .  L-Methylfolate-Algae (DEPLIN 15 PO), Take by mouth., Disp: , Rfl:  .  levothyroxine (SYNTHROID) 150 MCG tablet, TAKE 1  TABLET BY MOUTH EVERY DAY BEFORE BREAKFAST, Disp: 90 tablet, Rfl: 1 .  meclizine (ANTIVERT) 25 MG tablet, 1/2-1 pill up to 3 times daily for motion sickness/dizziness, Disp: 30 tablet, Rfl: 0 .  methylPREDNISolone (MEDROL DOSEPAK) 4 MG TBPK tablet, Take as prescribed on package., Disp: 21 tablet, Rfl: 0 .  rosuvastatin (CRESTOR) 5 MG tablet, Take 1 tablet (5 mg total) by mouth at bedtime., Disp: 90 tablet, Rfl: 3 .  triamcinolone ointment (KENALOG) 0.1 %, Apply a small amount sparingly topically to area of discomfort two to three times a day as needed for 10-14 days., Disp: 30 g, Rfl: 0 .  vitamin E 100 UNIT capsule, Take by mouth daily., Disp: , Rfl:  .  vortioxetine HBr ([name]) 20 MG TABS tablet, Take 20 mg by mouth daily., Disp: , Rfl:   Objective Blood pressure (!) 133/91, pulse 83, height 5\' 1"  (1.549 m), weight 202 lb (91.6 kg), last menstrual period 12/09/2019.  General WDWN female NAD Vulva:  normal appearing vulva with no masses, tenderness or lesions Specifically no urethral diverticulum no Skene's gland enlargement no Gartner's duct cyst or other canal of Nuck remnant cyst or paravaginal cyst  I could not cause Skene's gland emptying with massage  Vagina:  normal mucosa, no discharge Cervix:  Normal no lesions Uterus:  normal size, contour, position, consistency, mobility, non-tender Adnexa: ovaries:present,  normal adnexa in size, nontender and no masses  I did massage the paraurethral tissue to stimulate skene's and did not see any "ejacualate" in real time.  Pt did note a few minutes after exam some release of fluid which makes me think it is the [facility] gland evacuating, no cyst Pertinent ROS No burning with urination, frequency or urgency No nausea, vomiting or diarrhea Nor fever chills or other constitutional symptoms   Labs or  studies     Impression Diagnoses this Encounter::   ICD-10-CM   1. 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  No Bartholin's or urethral diverticulum is present which i would think remain present. No paravaginal either    Established relevant diagnosis(es):   Plan/Recommendations: No orders of the defined types were placed in this encounter.   Labs or Scans Ordered: No orders of the defined types were placed in this encounter.   Management:: If symptomatically enlarges come in for exam but currently no cyst present  Follow up Return if symptoms worsen or fail to improve.        Face to face time:  15 minutes  Greater than 50% of the visit time was spent in counseling and coordination of care with the patient.  The summary and outline of the counseling and care coordination is summarized in the note above.   All questions were answered.

## 2020-01-07 ENCOUNTER — Other Ambulatory Visit: Payer: Self-pay | Admitting: Adult Health Nurse Practitioner

## 2020-01-07 ENCOUNTER — Other Ambulatory Visit: Payer: Self-pay | Admitting: Physician Assistant

## 2020-01-16 ENCOUNTER — Telehealth: Payer: Self-pay | Admitting: *Deleted

## 2020-01-16 NOTE — Telephone Encounter (Signed)
Pt advised she needs to schedule an appt and will call back to schedule. JSY

## 2020-01-16 NOTE — Telephone Encounter (Signed)
Pt left message that her periods have been getting heavier the past three months. This month has been worse, she is going through a lot of pads. She currently is taking Iron BID. Wants someone to call her back to discuss.

## 2020-01-16 NOTE — Telephone Encounter (Signed)
Periods seem to be getting heavier for the last 3-4 months. Period is heavy for the first 3 days and period usually lasts 5-7 days. Pt is not on any birth control; husband has had a vasectomy. Does she need appt or Megace? Please advise. Thanks!! JSY

## 2020-01-22 ENCOUNTER — Other Ambulatory Visit: Payer: Self-pay

## 2020-01-22 ENCOUNTER — Encounter: Payer: Self-pay | Admitting: Adult Health

## 2020-01-22 ENCOUNTER — Ambulatory Visit: Payer: BC Managed Care – PPO | Admitting: Adult Health

## 2020-01-22 VITALS — BP 136/90 | HR 90 | Ht 61.0 in | Wt 202.0 lb

## 2020-01-22 DIAGNOSIS — N921 Excessive and frequent menstruation with irregular cycle: Secondary | ICD-10-CM | POA: Diagnosis not present

## 2020-01-22 DIAGNOSIS — N852 Hypertrophy of uterus: Secondary | ICD-10-CM | POA: Diagnosis not present

## 2020-01-22 DIAGNOSIS — Z3202 Encounter for pregnancy test, result negative: Secondary | ICD-10-CM | POA: Diagnosis not present

## 2020-01-22 LAB — POCT URINE PREGNANCY: Preg Test, Ur: NEGATIVE

## 2020-01-22 NOTE — Progress Notes (Signed)
  Subjective:     Patient ID: York Grice, female   DOB: January 28, 1981, 39 y.o.   MRN: [id number]  HPI Florabel is a 39 year old white female,married, G2P2, in complaining of heavy periods, esp last 3 months. She take 2 iron tablets daily. Had normal CBC and TSH in December, A1c 6.3. Last pap in 2019, negative HPV and malignancy  PCP is Dr Oneta Rack.   Review of Systems For last 3 months, period feel like they gush, and changes pads every hour or so.  Last period started out spotting for 5 days then period for 6-7 days then brown then red again Last time had sex felt like he it something  Reviewed past medical,surgical, social and family history. Reviewed medications and allergies.     Objective:   Physical Exam BP 136/90 (BP Location: Left Arm, Patient Position: Sitting, Cuff Size: Normal)   Pulse 90   Ht 5\' 1"  (1.549 m)   Wt 202 lb (91.6 kg)   LMP 01/10/2020 (Exact Date)   BMI 38.17 kg/m  UPT is negative. Skin warm and dry.Pelvic: external genitalia is normal in appearance no lesions, vagina: scant discharge without odor,urethra has no lesions or masses noted, cervix: bulbous, uterus: felt to be enlarged and is mildly tender, adnexa: no masses + tenderness noted, L>R. Bladder is non tender and no masses felt.  Examination chaperoned by 01/12/2020 LPN    Assessment:     1. Urine pregnancy test negative  2. Menorrhagia with irregular cycle Will get GYN [place] in about a week or so  3. Uterine enlargement Will get GYN [place] and talk when results back    Plan:     Get GYN [place] in about a week or so

## 2020-01-23 ENCOUNTER — Ambulatory Visit: Payer: Self-pay

## 2020-01-23 ENCOUNTER — Encounter: Payer: Self-pay | Admitting: Orthopaedic Surgery

## 2020-01-23 ENCOUNTER — Ambulatory Visit: Payer: BC Managed Care – PPO | Admitting: Orthopaedic Surgery

## 2020-01-23 VITALS — Ht 61.0 in | Wt 202.0 lb

## 2020-01-23 DIAGNOSIS — G5603 Carpal tunnel syndrome, bilateral upper limbs: Secondary | ICD-10-CM

## 2020-01-23 DIAGNOSIS — M79642 Pain in left hand: Secondary | ICD-10-CM

## 2020-01-23 DIAGNOSIS — M79641 Pain in right hand: Secondary | ICD-10-CM | POA: Diagnosis not present

## 2020-01-23 DIAGNOSIS — M25521 Pain in right elbow: Secondary | ICD-10-CM

## 2020-01-23 NOTE — Progress Notes (Signed)
Office Visit Note   Patient: Lauren [name]           Date of Birth: 10-Sep-1981           MRN: [id number] Visit Date: 01/23/2020              Requested by: Lucky Cowboy, MD [address] [place],  [place] [address] PCP: Lucky Cowboy, MD   Assessment & Plan: Visit Diagnoses:  1. Pain in both hands   2. Pain in right elbow   3. Carpal tunnel syndrome, bilateral     Plan:  #1: EMGs of both the hands to rule out carpal tunnel syndrome #2: Follow back up after EMGs.  Follow-Up Instructions: Return after EMG's.   Orders:  Orders Placed This Encounter  Procedures  . XR Wrist Complete Right  . XR Wrist Complete Left  . XR Elbow 2 Views Right  . Ambulatory referral to Physical Medicine Rehab   No orders of the defined types were placed in this encounter.     Procedures: No procedures performed   Clinical Data: No additional findings.   Subjective: Chief Complaint  Patient presents with  . Left Wrist - Follow-up, Pain  . Right Wrist - Follow-up, Pain  Patient presents today for bilateral carpal tunnel pain. She was last here in July of 2020. She has been wearing removable splints at night and most of the day. She was given a medrol dosepak at her last visit and states that it helped, but then the pain returned about a week after finishing the medicine.   HPI  Lauren [name] is a very pleasant 39 year old white female who was previously seen back in July 2020 for bilateral carpal tunnel pain and numbness.  The numbness was mainly at nighttime but then it progressed to pain in her fingers and her wrist besides the numbness.  She also had pain that traversed proximally.  At that time she had been wearing a brace on her arms at night which was helpful. She did have a history of carpal tunnel syndrome during her pregnancies.  She also has a history of fibromyalgia and is prediabetic.  Her symptoms have been worsening..  Seen today for evaluation.  She did  have a prednisone Dosepak from her last visit which did help but then the pain returned and about a week after finishing the steroid.  Review of Systems  She does have a history of prediabetes.  Also history of fibromyalgia.  She also has history of hypothyroidism, hypertension, hyperlipidemia, obesity, hepatic steatosis, obstructive sleep apnea with use of CPAP.   Objective: Vital Signs: Ht 5\' 1"  (1.549 m)   Wt 202 lb (91.6 kg)   LMP 01/10/2020 (Exact Date)   BMI 38.17 kg/m   Physical Exam Constitutional:      Appearance: She is well-developed.  Eyes:     Pupils: Pupils are equal, round, and reactive to light.  Pulmonary:     Effort: Pulmonary effort is normal.  Skin:    General: Skin is warm and dry.  Neurological:     Mental Status: She is alert and oriented to person, place, and time.  Psychiatric:        Behavior: Behavior normal.     Ortho Exam  Exam today reveals loss of supination by about 30 to 45 degrees.  She does have full pronation bilaterally and symmetric.  She only has around 30 degrees of dorsi flexion.  About 40 degrees of palmar flexion.  She does have some tenderness to palpation diffusely about the wrists bilaterally.  May have a little bit of intra-articular swelling noted.  Limited tenderness at the elbows bilaterally Lateral epicondyle. Mild tinel's   Specialty Comments:  No specialty comments available.  Imaging: XR Wrist Complete Left  Result Date: 01/23/2020 X-rays reviewed of the left wrist with Dr. Durward Fortes does not show any occult pathology.  XR Wrist Complete Right  Result Date: 01/23/2020 Three-view x-ray of the right wrist reviewed with Dr. Durward Fortes does not reveal any occult pathology.  XR Elbow 2 Views Right  Result Date: 01/23/2020 2 view x-ray of the right elbow reviewed with Dr. Durward Fortes does not reveal any occult pathology.    PMFS History: Current Outpatient Medications  Medication Sig Dispense Refill  . ALPRAZolam (XANAX)  0.5 MG tablet TAKE 1 TABLET (0.5 MG TOTAL) BY MOUTH AT BEDTIME AS NEEDED. 30 tablet 0  . amphetamine-dextroamphetamine ([name]) 20 MG 24 hr capsule Take 20 mg by mouth daily.    . ARIPiprazole ([name]) 10 MG tablet Take one half tablet by mouth daily, 5 mg daily 30 tablet 2  . Cholecalciferol (VITAMIN D3) 125 MCG (5000 UT) CAPS Take 1 capsule by mouth daily.    . Ferrous Sulfate 140 (45 Fe) MG TBCR Take 1 tablet by mouth daily.    Marland Kitchen L-Methylfolate-Algae (DEPLIN 15 PO) Take by mouth.    . levothyroxine (SYNTHROID) 150 MCG tablet TAKE 1 TABLET BY MOUTH EVERY DAY BEFORE BREAKFAST 90 tablet 1  . rosuvastatin (CRESTOR) 5 MG tablet Take 1 tablet (5 mg total) by mouth at bedtime. 90 tablet 3  . vitamin E 100 UNIT capsule Take by mouth daily.    Marland Kitchen vortioxetine HBr ([name]) 20 MG TABS tablet Take 20 mg by mouth daily.     No current facility-administered medications for this visit.    Patient Active Problem List   Diagnosis Date Noted  . Menorrhagia with irregular cycle 01/22/2020  . Urine pregnancy test negative 01/22/2020  . Uterine enlargement 01/22/2020  . Major depressive disorder, recurrent episode, moderate ([place]) 12/03/2019  . Carpal tunnel syndrome, bilateral 06/21/2019  . 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 Liver lesion, left lobe 10/04/2018  . Fibromyalgia 06/04/2018  . Obstructive sleep apnea treated with continuous positive airway pressure (CPAP) 05/23/2018  . 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 Hypersomnia with sleep apnea 01/16/2018  . Hepatomegaly 12/21/2017  . Hepatic steatosis 12/21/2017  . Headache associated with sexual activity 03/08/2017  . Dizziness 03/08/2017  . Medication management 09/09/2015  . Abnormal glucose 06/02/2015  . Hyperlipidemia LDL goal <100 06/02/2015  . Morbid obesity ([place]) 06/02/2015  . Depression   . Hypertension   . Hypothyroidism 04/30/2012  . 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 Paraparesis ([place]) 02/02/2012   Past Medical  History:  Diagnosis Date  . Anemia   . Anxiety   . Constipation   . Depression   . Fibromyalgia   . GERD (gastroesophageal reflux disease)   . Hemorrhoids   . HSV-1 infection   . Hypertension   . Hypothyroidism   . OSA on CPAP   . Other abnormal glucose   . Preeclampsia     Family History  Problem Relation Age of Onset  . Hypothyroidism Mother   . Cervical cancer Mother   . Bipolar disorder Mother   . Diabetes Mother   . Colon polyps Mother   . Bipolar disorder Brother   . Kidney disease Brother   . Alcohol abuse Brother   .  Hypothyroidism Daughter   . Heart disease Maternal Grandmother   . Diabetes Maternal Grandmother   . Cancer Maternal Grandmother        SKIN  . Hyperlipidemia Maternal Grandmother   . Hypertension Maternal Grandmother   . Hypothyroidism Maternal Grandmother   . Lung cancer Maternal Grandfather   . Heart disease Maternal Aunt   . Diabetes Maternal Aunt   . Diabetes Cousin   . Colon cancer Neg Hx     Past Surgical History:  Procedure Laterality Date  . CHOLECYSTECTOMY  2006  . TONSILLECTOMY    . WISDOM TOOTH EXTRACTION     Social History   Occupational History  . Occupation: Magazine features editor: [employer]  Tobacco Use  . Smoking status: Never Smoker  . Smokeless tobacco: Never Used  Substance and Sexual Activity  . Alcohol use: Yes    Alcohol/week: 0.0 standard drinks    Comment: occ  . Drug use: No  . Sexual activity: Yes    Partners: Male    Birth control/protection: None, Surgical    Comment: husband has had a vasectomy

## 2020-01-28 ENCOUNTER — Encounter: Payer: Self-pay | Admitting: Orthopaedic Surgery

## 2020-01-28 ENCOUNTER — Other Ambulatory Visit: Payer: Self-pay

## 2020-01-28 ENCOUNTER — Ambulatory Visit: Payer: BC Managed Care – PPO | Admitting: Orthopaedic Surgery

## 2020-01-28 VITALS — Ht 61.0 in | Wt 202.0 lb

## 2020-01-28 DIAGNOSIS — M65312 Trigger thumb, left thumb: Secondary | ICD-10-CM | POA: Diagnosis not present

## 2020-01-28 DIAGNOSIS — G5603 Carpal tunnel syndrome, bilateral upper limbs: Secondary | ICD-10-CM | POA: Diagnosis not present

## 2020-01-28 NOTE — Progress Notes (Signed)
Office Visit Note   Patient: Lauren [name]           Date of Birth: Jan 21, 1981           MRN: [id number] Visit Date: 01/28/2020              Requested by: Unk Pinto, [address] [place],  [place] [address] PCP: Unk Pinto, MD   Assessment & Plan: Visit Diagnoses:  1. Carpal tunnel syndrome, bilateral   2. Trigger thumb, left thumb     Plan: Recent office evaluation was consistent with bilateral carpal tunnel syndrome.  Awaiting EMGs and nerve conduction studies in early April.  Just recently experience catching and triggering of her left thumb.  Has a painful nodule on the palmar aspect of the thumb near the metacarpal phalangeal joint and hesitation to flex and extend the IP joint consistent with trigger thumb.  Long discussion regarding treatment options.  Will apply a splint and use Voltaren gel.  If still no improvement consider local cortisone injection.  If she does have carpal tunnel syndrome as I suspect she does might be worthwhile to consider carpal tunnel release and trigger thumb release left hand at the same setting  Follow-Up Instructions: Return After EMGs and nerve conduction studies that were previously scheduled.   Orders:  No orders of the defined types were placed in this encounter.  No orders of the defined types were placed in this encounter.     Procedures: No procedures performed   Clinical Data: No additional findings.   Subjective: Chief Complaint  Patient presents with  . Left Hand - Pain  Patient presents today for left thumb pain. She said that it woke up her up last night from sleep with pain in her PIP joint in her left thumb. No known injury. She said that it feels tight, throbbing, and pops. She spoke with a doctor on call last night and was told to make a splint with cardboard until she could be evaluated today. She is not taking anything for pain.  Awaiting EMGs and nerve conduction studies in early  April for presumed diagnosis of carpal tunnel syndrome  HPI  Review of Systems  Constitutional: Positive for fatigue.  HENT: Negative for ear pain.   Eyes: Negative for pain.  Respiratory: Negative for shortness of breath.   Cardiovascular: Negative for leg swelling.  Gastrointestinal: Positive for constipation. Negative for diarrhea.  Endocrine: Negative for cold intolerance and heat intolerance.  Genitourinary: Negative for difficulty urinating.  Musculoskeletal: Negative for joint swelling.  Skin: Negative for rash.  Allergic/Immunologic: Negative for food allergies.  Neurological: Negative for weakness.  Hematological: Does not bruise/bleed easily.  Psychiatric/Behavioral: Positive for sleep disturbance.     Objective: Vital Signs: Ht 5\' 1"  (1.549 m)   Wt 202 lb (91.6 kg)   LMP 01/10/2020 (Exact Date)   BMI 38.17 kg/m   Physical Exam  Ortho Exam left thumb with painful nodule on the palmar aspect at the level of the metacarpal phalangeal joint.  Obvious triggering.  Thumb not swollen.  Neurologically intact.  No pain about the IP joint.  Specialty Comments:  No specialty comments available.  Imaging: No results found.   PMFS History: Patient Active Problem List   Diagnosis Date Noted  . Trigger thumb, left thumb 01/28/2020  . Menorrhagia with irregular cycle 01/22/2020  . Urine pregnancy test negative 01/22/2020  . Uterine enlargement 01/22/2020  . Major depressive disorder, recurrent episode, moderate ([place]) 12/03/2019  .  Carpal tunnel syndrome, bilateral 06/21/2019  . 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 Liver lesion, left lobe 10/04/2018  . Fibromyalgia 06/04/2018  . Obstructive sleep apnea treated with continuous positive airway pressure (CPAP) 05/23/2018  . 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 Hypersomnia with sleep apnea 01/16/2018  . Hepatomegaly 12/21/2017  . Hepatic steatosis 12/21/2017  . Headache associated with sexual activity 03/08/2017  .  Dizziness 03/08/2017  . Medication management 09/09/2015  . Abnormal glucose 06/02/2015  . Hyperlipidemia LDL goal <100 06/02/2015  . Morbid obesity ([facility]) 06/02/2015  . Depression   . Hypertension   . Hypothyroidism 04/30/2012  . 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 Paraparesis ([facility]) 02/02/2012   Past Medical History:  Diagnosis Date  . Anemia   . Anxiety   . Constipation   . Depression   . Fibromyalgia   . GERD (gastroesophageal reflux disease)   . Hemorrhoids   . HSV-1 infection   . Hypertension   . Hypothyroidism   . OSA on CPAP   . Other abnormal glucose   . Preeclampsia     Family History  Problem Relation Age of Onset  . Hypothyroidism Mother   . Cervical cancer Mother   . Bipolar disorder Mother   . Diabetes Mother   . Colon polyps Mother   . Bipolar disorder Brother   . Kidney disease Brother   . Alcohol abuse Brother   . Hypothyroidism Daughter   . Heart disease Maternal Grandmother   . Diabetes Maternal Grandmother   . Cancer Maternal Grandmother        SKIN  . Hyperlipidemia Maternal Grandmother   . Hypertension Maternal Grandmother   . Hypothyroidism Maternal Grandmother   . Lung cancer Maternal Grandfather   . Heart disease Maternal Aunt   . Diabetes Maternal Aunt   . Diabetes Cousin   . Colon cancer Neg Hx     Past Surgical History:  Procedure Laterality Date  . CHOLECYSTECTOMY  2006  . TONSILLECTOMY    . WISDOM TOOTH EXTRACTION     Social History   Occupational History  . Occupation: Magazine features editor: [employer]  Tobacco Use  . Smoking status: Never Smoker  . Smokeless tobacco: Never Used  Substance and Sexual Activity  . Alcohol use: Yes    Alcohol/week: 0.0 standard drinks    Comment: occ  . Drug use: No  . Sexual activity: Yes    Partners: Male    Birth control/protection: None, Surgical    Comment: husband has had a vasectomy

## 2020-01-29 ENCOUNTER — Ambulatory Visit (INDEPENDENT_AMBULATORY_CARE_PROVIDER_SITE_OTHER): Payer: BC Managed Care – PPO | Admitting: Physician Assistant

## 2020-01-29 ENCOUNTER — Other Ambulatory Visit: Payer: Self-pay

## 2020-01-29 ENCOUNTER — Encounter: Payer: Self-pay | Admitting: Physician Assistant

## 2020-01-29 ENCOUNTER — Ambulatory Visit: Payer: BC Managed Care – PPO

## 2020-01-29 VITALS — BP 124/86 | HR 82 | Temp 97.7°F | Wt 199.0 lb

## 2020-01-29 DIAGNOSIS — R110 Nausea: Secondary | ICD-10-CM | POA: Diagnosis not present

## 2020-01-29 DIAGNOSIS — Z79899 Other long term (current) drug therapy: Secondary | ICD-10-CM | POA: Diagnosis not present

## 2020-01-29 DIAGNOSIS — R5383 Other fatigue: Secondary | ICD-10-CM | POA: Diagnosis not present

## 2020-01-29 DIAGNOSIS — K2100 Gastro-esophageal reflux disease with esophagitis, without bleeding: Secondary | ICD-10-CM | POA: Diagnosis not present

## 2020-01-29 DIAGNOSIS — E611 Iron deficiency: Secondary | ICD-10-CM

## 2020-01-29 MED ORDER — ONDANSETRON HCL 4 MG PO TABS: 4.0000 mg | ORAL_TABLET | Freq: Every day | ORAL | 1 refills | Status: DC | PRN

## 2020-01-29 MED ORDER — PANTOPRAZOLE SODIUM 40 MG PO TBEC: 40.0000 mg | DELAYED_RELEASE_TABLET | Freq: Every day | ORAL | 1 refills | Status: DC

## 2020-01-29 MED ORDER — AMPHETAMINE-DEXTROAMPHET ER 10 MG PO CP24: 10.0000 mg | ORAL_CAPSULE | Freq: Every day | ORAL | 0 refills | Status: DC

## 2020-01-29 MED ORDER — AMPHETAMINE-DEXTROAMPHET ER 20 MG PO CP24: 20.0000 mg | ORAL_CAPSULE | Freq: Every day | ORAL | 0 refills | Status: DC

## 2020-01-29 NOTE — Patient Instructions (Signed)
HYPERTENSION INFORMATION  Monitor your blood pressure at home, please keep a record and bring that in with you to your next office visit.   Can bring in your cuff too.   Go to the ER if any CP, SOB, nausea, dizziness, severe HA, changes vision/speech  Testing/Procedures: HOW TO TAKE YOUR BLOOD PRESSURE:  Rest 5 minutes before taking your blood pressure.  Don't smoke or drink caffeinated beverages for at least 30 minutes before.  Take your blood pressure before (not after) you eat.  Sit comfortably with your back supported and both feet on the floor (don't cross your legs).  Elevate your arm to heart level on a table or a desk.  Use the proper sized cuff. It should fit smoothly and snugly around your bare upper arm. There should be enough room to slip a fingertip under the cuff. The bottom edge of the cuff should be 1 inch above the crease of the elbow.  Due to a recent study, SPRINT, we have changed our goal for the systolic or top blood pressure number. Ideally we want your top number at 120.  In the [facility] Trial, 5000 people were randomized to a goal BP of 120 and 5000 people were randomized to a goal BP of less than 140. The patients with the goal BP at 120 had LESS DEMENTIA, LESS HEART ATTACKS, AND LESS STROKES, AS WELL AS OVERALL DECREASED MORTALITY OR DEATH RATE.   There was another study that showed taking your blood pressure medications at night decrease cardiovascular events.  However if you are on a fluid pill, please take this in the morning.   If you are willing, our goal BP is the top number of 120.  Your most recent BP: BP: 124/86   Take your medications faithfully as instructed. Maintain a healthy weight. Get at least 150 minutes of aerobic exercise per week. Minimize salt intake. Minimize alcohol intake  DASH Eating Plan DASH stands for "Dietary Approaches to Stop Hypertension." The DASH eating plan is a healthy eating plan that has been shown to reduce high  blood pressure (hypertension). Additional health benefits may include reducing the risk of type 2 diabetes mellitus, heart disease, and stroke. The DASH eating plan may also help with weight loss. WHAT DO I NEED TO KNOW ABOUT THE DASH EATING PLAN? For the DASH eating plan, you will follow these general guidelines:  Choose foods with a percent daily value for sodium of less than 5% (as listed on the food label).  Use salt-free seasonings or herbs instead of table salt or sea salt.  Check with your health care provider or pharmacist before using salt substitutes.  Eat lower-sodium products, often labeled as "lower sodium" or "no salt added."  Eat fresh foods.  Eat more vegetables, fruits, and low-fat dairy products.  Choose whole grains. Look for the word "whole" as the first word in the ingredient list.  Choose fish and skinless chicken or [place] more often than red meat. Limit fish, poultry, and meat to 6 oz (170 g) each day.  Limit sweets, desserts, sugars, and sugary drinks.  Choose heart-healthy fats.  Limit cheese to 1 oz (28 g) per day.  Eat more home-cooked food and less restaurant, buffet, and fast food.  Limit fried foods.  Cook foods using methods other than frying.  Limit canned vegetables. If you do use them, rinse them well to decrease the sodium.  When eating at a restaurant, ask that your food be prepared with less salt,  or no salt if possible. WHAT FOODS CAN I EAT? Seek help from a dietitian for individual calorie needs. Grains Whole grain or whole wheat bread. Brown rice. Whole grain or whole wheat pasta. Quinoa, bulgur, and whole grain cereals. Low-sodium cereals. Corn or whole wheat flour tortillas. Whole grain cornbread. Whole grain crackers. Low-sodium crackers. Vegetables Fresh or frozen vegetables (raw, steamed, roasted, or grilled). Low-sodium or reduced-sodium tomato and vegetable juices. Low-sodium or reduced-sodium tomato sauce and paste. Low-sodium  or reduced-sodium canned vegetables.  Fruits All fresh, canned (in natural juice), or frozen fruits. Meat and Other Protein Products Ground beef (85% or leaner), grass-fed beef, or beef trimmed of fat. Skinless chicken or [place]. Ground chicken or [place]. Pork trimmed of fat. All fish and seafood. Eggs. Dried beans, peas, or lentils. Unsalted nuts and seeds. Unsalted canned beans. Dairy Low-fat dairy products, such as skim or 1% milk, 2% or reduced-fat cheeses, low-fat ricotta or cottage cheese, or plain low-fat yogurt. Low-sodium or reduced-sodium cheeses. Fats and Oils Tub margarines without trans fats. Light or reduced-fat mayonnaise and salad dressings (reduced sodium). Avocado. Safflower, olive, or canola oils. Natural peanut or almond butter. Other Unsalted popcorn and pretzels. The items listed above may not be a complete list of recommended foods or beverages. Contact your dietitian for more options. WHAT FOODS ARE NOT RECOMMENDED? Grains White bread. White pasta. White rice. Refined cornbread. Bagels and croissants. Crackers that contain trans fat. Vegetables Creamed or fried vegetables. Vegetables in a cheese sauce. Regular canned vegetables. Regular canned tomato sauce and paste. Regular tomato and vegetable juices. Fruits Dried fruits. Canned fruit in light or heavy syrup. Fruit juice. Meat and Other Protein Products Fatty cuts of meat. Ribs, chicken wings, bacon, sausage, bologna, salami, chitterlings, fatback, hot dogs, bratwurst, and packaged luncheon meats. Salted nuts and seeds. Canned beans with salt. Dairy Whole or 2% milk, cream, half-and-half, and cream cheese. Whole-fat or sweetened yogurt. Full-fat cheeses or blue cheese. Nondairy creamers and whipped toppings. Processed cheese, cheese spreads, or cheese curds. Condiments Onion and garlic salt, seasoned salt, table salt, and sea salt. Canned and packaged gravies. Worcestershire sauce. Tartar sauce. Barbecue sauce.  Teriyaki sauce. Soy sauce, including reduced sodium. Steak sauce. Fish sauce. Oyster sauce. Cocktail sauce. Horseradish. Ketchup and mustard. Meat flavorings and tenderizers. Bouillon cubes. Hot sauce. Tabasco sauce. Marinades. Taco seasonings. Relishes. Fats and Oils Butter, stick margarine, lard, shortening, ghee, and bacon fat. Coconut, palm kernel, or palm oils. Regular salad dressings. Other Pickles and olives. Salted popcorn and pretzels. The items listed above may not be a complete list of foods and beverages to avoid. Contact your dietitian for more information. WHERE CAN I FIND MORE INFORMATION? National Heart, Lung, and Blood Institute: [employer] Document Released: 10/27/2011 Document Revised: 03/24/2014 Document Reviewed: 09/11/2013 [facility] Patient Information 2015 [place], [place]. This information is not intended to replace advice given to you by your health care provider. Make sure you discuss any questions you have with your health care provider.

## 2020-01-29 NOTE — Progress Notes (Signed)
Subjective:    Patient ID: Lauren [name], female    DOB: 1981/06/18, 39 y.o.   MRN: [id number]  Cough Associated symptoms include headaches and myalgias.  Headache  Associated symptoms include coughing and numbness. Pertinent negatives include no back pain, dizziness, neck pain, seizures or weakness.  Anxiety Patient reports no dizziness.     39 y.o. right handed obese WF that has long standing history of anxiety, FM, OSA, HTN, hypothyroidism, depression, headaches, possible narcolepsy presents with a multitude of symptoms.   She had the COVID vaccine Feb 26th, states symptoms started since that time. She states she had fatigue, tongue tingling, and mouth was cold.   Nausea, very tired, and headaches.  She states she will have extreme fatigue, she will go to bed at 630 pm and sleep all night and still be tired, wants to crawl back in to bed.  She has been having nausea, states they have been trying to eat healthier, nothing better or worse. No vomiting. She will have upper AB cramping with the nausea. MRI AB 09/2018 normal other than unchanged lesion.    She has been having chest tightness occ. States she has some anxiety with Barbara Cower having issues.  She has had a constant headache, frontal headache, nothing worse and nothing better. No changes in vision, no changes in speech. She states when lying down with her eyes closed she will fee some vertigo.  Normal CT head 07/2010 MRI 2010  She has been having irregular heavy menses, has seen GYN and getting [place] today.   She has been on the iron but stopped 03/08. Colonoscopy 11/2017 Lab Results  Component Value Date   IRON 34 (L) 11/05/2019   TIBC 333 11/05/2019   FERRITIN 85 04/10/2019    Lab Results  Component Value Date   VITAMINB12 503 11/05/2019   Blood pressure 124/86, pulse 82, temperature 97.7 F (36.5 C), weight 199 lb (90.3 kg), last menstrual period 01/10/2020, SpO2 98 %.   Medications Current Outpatient  Medications on File Prior to Visit  Medication Sig  . ALPRAZolam (XANAX) 0.5 MG tablet TAKE 1 TABLET (0.5 MG TOTAL) BY MOUTH AT BEDTIME AS NEEDED.  Marland Kitchen amphetamine-dextroamphetamine ([name]) 20 MG 24 hr capsule Take 20 mg by mouth daily.  . ARIPiprazole ([name]) 10 MG tablet Take one half tablet by mouth daily, 5 mg daily  . Cholecalciferol (VITAMIN D3) 125 MCG (5000 UT) CAPS Take 1 capsule by mouth daily.  . Ferrous Sulfate 140 (45 Fe) MG TBCR Take 1 tablet by mouth daily.  Marland Kitchen L-Methylfolate-Algae (DEPLIN 15 PO) Take by mouth.  . levothyroxine (SYNTHROID) 150 MCG tablet TAKE 1 TABLET BY MOUTH EVERY DAY BEFORE BREAKFAST  . rosuvastatin (CRESTOR) 5 MG tablet Take 1 tablet (5 mg total) by mouth at bedtime.  . vitamin E 100 UNIT capsule Take by mouth daily.  Marland Kitchen vortioxetine HBr ([name]) 20 MG TABS tablet Take 20 mg by mouth daily.   No current facility-administered medications on file prior to visit.    Problem list She has Hypothyroidism; Paralysis, periodic secondary; Depression; Hypertension; Abnormal glucose; Hyperlipidemia LDL goal <100; Morbid obesity (HCC); Medication management; Paraparesis (HCC); Headache associated with sexual activity; Dizziness; Hepatomegaly; Hepatic steatosis; Recurrent isolated sleep paralysis; Hypersomnia with sleep apnea; Obstructive sleep apnea treated with continuous positive airway pressure (CPAP); Fibromyalgia; Liver lesion, left lobe; Narcolepsy; Carpal tunnel syndrome, bilateral; Major depressive disorder, recurrent episode, moderate (HCC); Menorrhagia with irregular cycle; Urine pregnancy test negative; Uterine enlargement; and Trigger thumb, left  thumb on their problem list.    Review of Systems  Constitutional: Negative.   HENT: Negative.   Respiratory: Positive for cough.   Cardiovascular: Negative.   Gastrointestinal: Negative.   Genitourinary: Negative.   Musculoskeletal: Positive for myalgias. Negative for arthralgias, back pain, gait  problem, joint swelling, neck pain and neck stiffness.  Neurological: Positive for numbness and headaches. Negative for dizziness, tremors, seizures, syncope, facial asymmetry, speech difficulty, weakness and light-headedness.       Objective:   Physical Exam Constitutional:      Appearance: She is obese.     Comments: Patient tearful  HENT:     Head: Normocephalic.     Nose:     Right Turbinates: Not enlarged.     Left Turbinates: Not enlarged.     Right Sinus: Frontal sinus tenderness present.     Left Sinus: Frontal sinus tenderness present.  Eyes:     Extraocular Movements: Extraocular movements intact.     Right eye: Normal extraocular motion.     Left eye: Normal extraocular motion.     Pupils: Pupils are equal.  Cardiovascular:     Rate and Rhythm: Normal rate and regular rhythm.     Heart sounds: Normal heart sounds.  Pulmonary:     Effort: Pulmonary effort is normal.     Breath sounds: Normal breath sounds.  Abdominal:     General: Bowel sounds are normal.     Palpations: Abdomen is soft.     Tenderness: There is abdominal tenderness (epigastric).  Musculoskeletal:        General: No swelling. Normal range of motion.     Cervical back: Normal range of motion and neck supple.  Lymphadenopathy:     Cervical: No cervical adenopathy.  Skin:    General: Skin is warm.     Findings: No rash.  Neurological:     Mental Status: She is alert.     Cranial Nerves: No cranial nerve deficit, dysarthria or facial asymmetry.     Sensory: No sensory deficit.     Motor: No weakness.     Coordination: Coordination normal.     Gait: Gait normal.     Deep Tendon Reflexes: Reflexes normal.  Psychiatric:        Mood and Affect: Mood is anxious (tearful).        Assessment & Plan:  Lauren [name] was seen today for cough, headache, fatigue, anxiety and chills.  Diagnoses and all orders for this visit:  Gastroesophageal reflux disease with esophagitis without hemorrhage + epigastric  tenderness, AB cramping, GERD, nausea- no nsaids, no ETOH, will check for h pylori before starting protonix, may need EGD -     H. pylori breath test -     pantoprazole (PROTONIX) 40 MG tablet; Take 1 tablet (40 mg total) by mouth daily.  Iron deficiency -     Iron,Total/Total Iron Binding Cap -     Ferritin ? From menses and if need EGD  Nausea -     CBC with Differential/Platelet -     COMPLETE METABOLIC PANEL WITH GFR -     H. pylori breath test - Zofran sent in -     pantoprazole (PROTONIX) 40 MG tablet; Take 1 tablet (40 mg total) by mouth daily. Check labs- ? If from GERD- will rule out H yplori and start on PPI  Fatigue, unspecified type -     TSH -     amphetamine-dextroamphetamine ([name]) 10 MG 24 hr  capsule; Take 1 capsule (10 mg total) by mouth daily.- to take at lunch with the 20 at breakfast- if insurance does not cover will do the fasting acting 10 mg to take at lunch.  Check labs for iron/thyroid She states she will have a "crash" feeling around 630- possible that the adderall is not working at that time or a withdrawal- will start on 10 mg at lunch to see if this helps - long discussion about the narcolepsy screening, she is very anxious about this, started to cry- I suggest CBT therapy and talk with Dr. Buddy Duty if this would be the best for her  Medication management -     Magnesium   Future Appointments  Date Time Provider [place]  01/29/2020  4:00 PM [facility] [place] [facility] None  02/25/2020  4:00 PM Magnus Sinning, MD [facility] None  04/01/2020  3:30 PM Dohmeier, Asencion Partridge, MD [facility] None  04/15/2020  3:00 PM Vicie Mutters, PA-C [facility] None

## 2020-01-30 LAB — COMPLETE METABOLIC PANEL WITH GFR
AG Ratio: 2 (calc) (ref 1.0–2.5)
ALT: 32 U/L — ABNORMAL HIGH (ref 6–29)
AST: 42 U/L — ABNORMAL HIGH (ref 10–30)
Albumin: 4.9 g/dL (ref 3.6–5.1)
Alkaline phosphatase (APISO): 66 U/L (ref 31–125)
BUN: 15 mg/dL (ref 7–25)
CO2: 27 mmol/L (ref 20–32)
Calcium: 10.1 mg/dL (ref 8.6–10.2)
Chloride: 103 mmol/L (ref 98–110)
Creat: 0.65 mg/dL (ref 0.50–1.10)
GFR, Est African American: 128 mL/min/{1.73_m2} (ref >=60)
GFR, Est Non African American: 110 mL/min/{1.73_m2} (ref >=60)
Globulin: 2.5 g/dL (calc) (ref 1.9–3.7)
Glucose, Bld: 101 mg/dL — ABNORMAL HIGH (ref 65–99)
Potassium: 4.2 mmol/L (ref 3.5–5.3)
Sodium: 139 mmol/L (ref 135–146)
Total Bilirubin: 0.4 mg/dL (ref 0.2–1.2)
Total Protein: 7.4 g/dL (ref 6.1–8.1)

## 2020-01-30 LAB — IRON, TOTAL/TOTAL IRON BINDING CAP
%SAT: 11 % (calc) — ABNORMAL LOW (ref 16–45)
Iron: 41 ug/dL (ref 40–190)
TIBC: 373 mcg/dL (calc) (ref 250–450)

## 2020-01-30 LAB — MAGNESIUM: Magnesium: 2.2 mg/dL (ref 1.5–2.5)

## 2020-01-30 LAB — CBC WITH DIFFERENTIAL/PLATELET
Absolute Monocytes: 542 cells/uL (ref 200–950)
Basophils Absolute: 17 cells/uL (ref 0–200)
Basophils Relative: 0.2 %
Eosinophils Absolute: 60 cells/uL (ref 15–500)
Eosinophils Relative: 0.7 %
HCT: 39.9 % (ref 35.0–45.0)
Hemoglobin: 13.2 g/dL (ref 11.7–15.5)
Lymphs Abs: 2554 cells/uL (ref 850–3900)
MCH: 29 pg (ref 27.0–33.0)
MCHC: 33.1 g/dL (ref 32.0–36.0)
MCV: 87.7 fL (ref 80.0–100.0)
MPV: 12.4 fL (ref 7.5–12.5)
Monocytes Relative: 6.3 %
Neutro Abs: 5427 cells/uL (ref 1500–7800)
Neutrophils Relative %: 63.1 %
Platelets: 186 10*3/uL (ref 140–400)
RBC: 4.55 10*6/uL (ref 3.80–5.10)
RDW: 13 % (ref 11.0–15.0)
Total Lymphocyte: 29.7 %
WBC: 8.6 10*3/uL (ref 3.8–10.8)

## 2020-01-30 LAB — H. PYLORI BREATH TEST: H. pylori Breath Test: NOT DETECTED

## 2020-01-30 LAB — FERRITIN: Ferritin: 82 ng/mL (ref 16–232)

## 2020-01-30 LAB — TSH: TSH: 1.16 mIU/L

## 2020-02-06 ENCOUNTER — Other Ambulatory Visit: Payer: Self-pay

## 2020-02-06 ENCOUNTER — Ambulatory Visit (INDEPENDENT_AMBULATORY_CARE_PROVIDER_SITE_OTHER): Payer: BC Managed Care – PPO

## 2020-02-06 ENCOUNTER — Other Ambulatory Visit: Payer: Self-pay | Admitting: Adult Health

## 2020-02-06 DIAGNOSIS — N83291 Other ovarian cyst, right side: Secondary | ICD-10-CM | POA: Diagnosis not present

## 2020-02-06 DIAGNOSIS — N921 Excessive and frequent menstruation with irregular cycle: Secondary | ICD-10-CM

## 2020-02-06 DIAGNOSIS — N83202 Unspecified ovarian cyst, left side: Secondary | ICD-10-CM

## 2020-02-06 NOTE — Progress Notes (Signed)
PELVIC US TA/TV: homogeneous anteverted uterus,wnl,EEC 12.6 mm,mult simple nabothian cysts,normal right ovary,simple left ovarian cyst 5 x 4.2 x 4.5 cm,no free fluid,no pain during ultrasound,ovaries appear mobile

## 2020-02-11 ENCOUNTER — Other Ambulatory Visit: Payer: Self-pay

## 2020-02-11 ENCOUNTER — Telehealth: Payer: Self-pay | Admitting: Orthopaedic Surgery

## 2020-02-11 MED ORDER — TRAMADOL HCL 50 MG PO TABS: ORAL_TABLET | ORAL | 0 refills | Status: DC

## 2020-02-11 NOTE — Telephone Encounter (Signed)
Please advise 

## 2020-02-11 NOTE — Telephone Encounter (Signed)
Pt called in said she would like a call back in regards to her trigger thumb, said she is having a lot of pain and is waiting to have a nerve test done for surgery but she would like to do something for the pain now.   [phone]

## 2020-02-11 NOTE — Telephone Encounter (Signed)
Not sure what she would like-splint finger so it will not trigger. Tramadol for pain. Awaiting NCV's to rule in or out carpal tunnel. Will consider surgery for both depending on results

## 2020-02-11 NOTE — Telephone Encounter (Signed)
Spoke with patient. Tramadol has been called into pharmacy. She is scheduled for her NCS on Friday.

## 2020-02-14 ENCOUNTER — Other Ambulatory Visit: Payer: Self-pay

## 2020-02-14 ENCOUNTER — Ambulatory Visit: Payer: BC Managed Care – PPO | Admitting: Physical Medicine and Rehabilitation

## 2020-02-14 DIAGNOSIS — R202 Paresthesia of skin: Secondary | ICD-10-CM

## 2020-02-14 NOTE — Progress Notes (Signed)
  Numeric Pain Rating Scale and Functional Assessment Average Pain 3   In the last MONTH (on 0-10 scale) has pain interfered with the following?  1. General activity like being  able to carry out your everyday physical activities such as walking, climbing stairs, carrying groceries, or moving a chair?  Rating(3)

## 2020-02-17 ENCOUNTER — Other Ambulatory Visit: Payer: Self-pay | Admitting: Physician Assistant

## 2020-02-17 DIAGNOSIS — Z1231 Encounter for screening mammogram for malignant neoplasm of breast: Secondary | ICD-10-CM

## 2020-02-17 NOTE — Progress Notes (Signed)
Lauren [name] - 39 y.o. female MRN [id number]  Date of birth: May 05, 1981  Office Visit Note: Visit Date: 02/14/2020 PCP: Lauren Pinto, MD Referred by: Lauren Pinto, MD  Subjective: Chief Complaint  Patient presents with  . Right Wrist - Pain  . Left Wrist - Pain  . Right Hand - Numbness  . Left Hand - Numbness   HPI: Lauren [name] is a 39 y.o. female who comes in today For electrodiagnostic study of both upper limbs at the request of Dr. Joni [name].  Patient is right-hand dominant with a history of several years of progressive worsening pain in the wrist with some paresthesia type numbness tingling particular on the right but in the third, fourth digits.  Symptoms on the left are more nondermatomal.  She gets some referral up in the arms with electrical-like sensations.  Her her situation is complicated by known fibromyalgia.  She has not had prior electrodiagnostic study.  She has not had prior carpal tunnel surgery.  She has been wearing wrist splints which seem to help.  Initial symptoms seem to occur more at night.  ROS Otherwise per HPI.  Assessment & Plan: Visit Diagnoses:  1. Paresthesia of skin     Plan: Impression: The above electrodiagnostic study is ABNORMAL and reveals evidence of a moderate right median nerve entrapment at the wrist (carpal tunnel syndrome) affecting sensory and motor components.   There is no significant electrodiagnostic evidence of any other focal nerve entrapment (normal Left side), brachial plexopathy or generalized peripheral neuropathy.   Recommendations: 1.  Follow-up with referring physician. 2.  Continue current management of symptoms. 3.  Continue use of resting splint at night-time and as needed during the day. 4.  Suggest surgical evaluation.   Meds & Orders: No orders of the defined types were placed in this encounter.   Orders Placed This Encounter  Procedures  . NCV with EMG (electromyography)      Follow-up: No follow-ups on file.   Procedures: No procedures performed  No notes on file   Clinical History: No specialty comments available.   She reports that she has never smoked. She has never used smokeless tobacco.  Recent Labs    07/18/19 1600 10/21/19 1657 11/05/19 1431  HGBA1C 6.4* 6.5* 6.3*    Objective:  VS:  HT:    WT:   BMI:     BP:   HR: bpm  TEMP: ( )  RESP:  Physical Exam Musculoskeletal:        General: No swelling, tenderness or deformity.     Comments: Inspection reveals no atrophy of the bilateral APB or FDI or hand intrinsics. There is no swelling, color changes, allodynia or dystrophic changes. There is 5 out of 5 strength in the bilateral wrist extension, finger abduction and long finger flexion. There is intact sensation to light touch in all dermatomal and peripheral nerve distributions.  There is a negative Hoffmann's test bilaterally.  Skin:    General: Skin is warm and dry.     Findings: No erythema or rash.  Neurological:     General: No focal deficit present.     Mental Status: She is alert and oriented to person, place, and time.     Motor: No weakness or abnormal muscle tone.     Coordination: Coordination normal.  Psychiatric:        Mood and Affect: Mood normal.        Behavior: Behavior normal.     Ortho  Exam Imaging: No results found.  Past Medical/Family/Surgical/Social History: Medications & Allergies reviewed per EMR, new medications updated. Patient Active Problem List   Diagnosis Date Noted  . Trigger thumb, left thumb 01/28/2020  . Menorrhagia with irregular cycle 01/22/2020  . Urine pregnancy test negative 01/22/2020  . Uterine enlargement 01/22/2020  . Major depressive disorder, recurrent episode, moderate (HCC) 12/03/2019  . Carpal tunnel syndrome, bilateral 06/21/2019  . 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 Liver lesion, left lobe 10/04/2018  . Fibromyalgia 06/04/2018  . Obstructive sleep apnea treated with continuous  positive airway pressure (CPAP) 05/23/2018  . 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 Hypersomnia with sleep apnea 01/16/2018  . Hepatomegaly 12/21/2017  . Hepatic steatosis 12/21/2017  . Headache associated with sexual activity 03/08/2017  . Dizziness 03/08/2017  . Medication management 09/09/2015  . Abnormal glucose 06/02/2015  . Hyperlipidemia LDL goal <100 06/02/2015  . Morbid obesity (HCC) 06/02/2015  . Depression   . Hypertension   . Hypothyroidism 04/30/2012  . 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 Paraparesis (HCC) 02/02/2012   Past Medical History:  Diagnosis Date  . Anemia   . Anxiety   . Constipation   . Depression   . Fibromyalgia   . GERD (gastroesophageal reflux disease)   . Hemorrhoids   . HSV-1 infection   . Hypertension   . Hypothyroidism   . OSA on CPAP   . Other abnormal glucose   . Preeclampsia    Family History  Problem Relation Age of Onset  . Hypothyroidism Mother   . Cervical cancer Mother   . Bipolar disorder Mother   . Diabetes Mother   . Colon polyps Mother   . Bipolar disorder Brother   . Kidney disease Brother   . Alcohol abuse Brother   . Hypothyroidism Daughter   . Heart disease Maternal Grandmother   . Diabetes Maternal Grandmother   . Cancer Maternal Grandmother        SKIN  . Hyperlipidemia Maternal Grandmother   . Hypertension Maternal Grandmother   . Hypothyroidism Maternal Grandmother   . Lung cancer Maternal Grandfather   . Heart disease Maternal Aunt   . Diabetes Maternal Aunt   . Diabetes Cousin   . Colon cancer Neg Hx    Past Surgical History:  Procedure Laterality Date  . CHOLECYSTECTOMY  2006  . TONSILLECTOMY    . WISDOM TOOTH EXTRACTION     Social History   Occupational History  . Occupation: Magazine features editor: [employer]  Tobacco Use  . Smoking status: Never Smoker  . Smokeless tobacco: Never Used  Substance and Sexual Activity  . Alcohol use: Yes    Alcohol/week: 0.0  standard drinks    Comment: occ  . Drug use: No  . Sexual activity: Yes    Partners: Male    Birth control/protection: None, Surgical    Comment: husband has had a vasectomy

## 2020-02-18 NOTE — Procedures (Signed)
EMG & NCV Findings: Evaluation of the right median motor nerve showed prolonged distal onset latency (4.7 ms) and decreased conduction velocity (Elbow-Wrist, 39 m/s).  The left median (across palm) sensory nerve showed prolonged distal peak latency (Palm, 2.1 ms).  The right median (across palm) sensory nerve showed prolonged distal peak latency (Wrist, 4.4 ms) and prolonged distal peak latency (Palm, 2.2 ms).  All remaining nerves (as indicated in the following tables) were within normal limits.  Left vs. Right side comparison data for the median motor nerve indicates abnormal L-R latency difference (1.1 ms) and abnormal L-R velocity difference (Elbow-Wrist, 11 m/s).  All remaining left vs. right side differences were within normal limits.    All examined muscles (as indicated in the following table) showed no evidence of electrical instability.    Impression: The above electrodiagnostic study is ABNORMAL and reveals evidence of a moderate right median nerve entrapment at the wrist (carpal tunnel syndrome) affecting sensory and motor components.   There is no significant electrodiagnostic evidence of any other focal nerve entrapment (normal Left side), brachial plexopathy or generalized peripheral neuropathy.   Recommendations: 1.  Follow-up with referring physician. 2.  Continue current management of symptoms. 3.  Continue use of resting splint at night-time and as needed during the day. 4.  Suggest surgical evaluation.  ___________________________ Laurence Spates FAAPMR Board Certified, American Board of Physical Medicine and Rehabilitation    Nerve Conduction Studies Anti Sensory Summary Table   Stim Site NR Peak (ms) Norm Peak (ms) P-T Amp (V) Norm P-T Amp Site1 Site2 Delta-P (ms) Dist (cm) Vel (m/s) Norm Vel (m/s)  Left Median Acr Palm Anti Sensory (2nd Digit)  30.2C  Wrist    3.5 <3.6 55.7 >10 Wrist Palm 1.4 0.0    Palm    *2.1 <2.0 50.6         Right Median Acr Palm Anti Sensory  (2nd Digit)  30.8C  Wrist    *4.4 <3.6 21.9 >10 Wrist Palm 2.2 0.0    Palm    *2.2 <2.0 19.7         Left Radial Anti Sensory (Base 1st Digit)  30C  Wrist    2.3 <3.1 21.1  Wrist Base 1st Digit 2.3 0.0    Right Radial Anti Sensory (Base 1st Digit)  30.4C  Wrist    2.2 <3.1 24.5  Wrist Base 1st Digit 2.2 0.0    Left Ulnar Anti Sensory (5th Digit)  30.2C  Wrist    3.4 <3.7 36.9 >15.0 Wrist 5th Digit 3.4 14.0 41 >38  Right Ulnar Anti Sensory (5th Digit)  30.7C  Wrist    3.2 <3.7 39.7 >15.0 Wrist 5th Digit 3.2 14.0 44 >38   Motor Summary Table   Stim Site NR Onset (ms) Norm Onset (ms) O-P Amp (mV) Norm O-P Amp Site1 Site2 Delta-0 (ms) Dist (cm) Vel (m/s) Norm Vel (m/s)  Left Median Motor (Abd Poll Brev)  29.8C  Wrist    3.6 <4.2 9.4 >5 Elbow Wrist 3.3 16.5 50 >50  Elbow    6.9  9.5         Right Median Motor (Abd Poll Brev)  30.4C    Martin-Gruber  Wrist    *4.7 <4.2 5.3 >5 Elbow Wrist 4.0 15.5 *39 >50  Elbow    8.7  5.1         Left Ulnar Motor (Abd Dig Min)  29.8C  Wrist    3.0 <4.2 11.0 >3 B Elbow Wrist 3.0  17.0 57 >53  B Elbow    6.0  11.1  A Elbow B Elbow 1.3 9.0 69 >53  A Elbow    7.3  11.0         Right Ulnar Motor (Abd Dig Min)  30.3C  Wrist    2.8 <4.2 12.0 >3 B Elbow Wrist 2.8 16.0 57 >53  B Elbow    5.6  12.7  A Elbow B Elbow 1.2 10.0 83 >53  A Elbow    6.8  12.7          EMG   Side Muscle Nerve Root Ins Act Fibs Psw Amp Dur Poly Recrt Int Dennie Bible Comment  Right Abd Poll Brev Median C8-T1 Nml Nml Nml Nml Nml 0 Nml Nml   Right 1stDorInt Ulnar C8-T1 Nml Nml Nml Nml Nml 0 Nml Nml   Right PronatorTeres Median C6-7 Nml Nml Nml Nml Nml 0 Nml Nml   Right Biceps Musculocut C5-6 Nml Nml Nml Nml Nml 0 Nml Nml   Right Deltoid Axillary C5-6 Nml Nml Nml Nml Nml 0 Nml Nml     Nerve Conduction Studies Anti Sensory Left/Right Comparison   Stim Site L Lat (ms) R Lat (ms) L-R Lat (ms) L Amp (V) R Amp (V) L-R Amp (%) Site1 Site2 L Vel (m/s) R Vel (m/s) L-R Vel (m/s)  Median Acr  Palm Anti Sensory (2nd Digit)  30.2C  Wrist 3.5 *4.4 0.9 55.7 21.9 60.7 Wrist Palm     Palm *2.1 *2.2 0.1 50.6 19.7 61.1       Radial Anti Sensory (Base 1st Digit)  30C  Wrist 2.3 2.2 0.1 21.1 24.5 13.9 Wrist Base 1st Digit     Ulnar Anti Sensory (5th Digit)  30.2C  Wrist 3.4 3.2 0.2 36.9 39.7 7.1 Wrist 5th Digit 41 44 3   Motor Left/Right Comparison   Stim Site L Lat (ms) R Lat (ms) L-R Lat (ms) L Amp (mV) R Amp (mV) L-R Amp (%) Site1 Site2 L Vel (m/s) R Vel (m/s) L-R Vel (m/s)  Median Motor (Abd Poll Brev)  29.8C  Wrist 3.6 *4.7 *1.1 9.4 5.3 43.6 Elbow Wrist 50 *39 *11  Elbow 6.9 8.7 1.8 9.5 5.1 46.3       Ulnar Motor (Abd Dig Min)  29.8C  Wrist 3.0 2.8 0.2 11.0 12.0 8.3 B Elbow Wrist 57 57 0  B Elbow 6.0 5.6 0.4 11.1 12.7 12.6 A Elbow B Elbow 69 83 14  A Elbow 7.3 6.8 0.5 11.0 12.7 13.4          Waveforms:

## 2020-02-19 ENCOUNTER — Other Ambulatory Visit: Payer: Self-pay

## 2020-02-19 ENCOUNTER — Encounter: Payer: Self-pay | Admitting: Orthopaedic Surgery

## 2020-02-19 ENCOUNTER — Ambulatory Visit: Payer: BC Managed Care – PPO | Admitting: Orthopaedic Surgery

## 2020-02-19 VITALS — Ht 61.0 in | Wt 199.0 lb

## 2020-02-19 DIAGNOSIS — G5603 Carpal tunnel syndrome, bilateral upper limbs: Secondary | ICD-10-CM | POA: Diagnosis not present

## 2020-02-19 DIAGNOSIS — M65312 Trigger thumb, left thumb: Secondary | ICD-10-CM

## 2020-02-19 NOTE — Progress Notes (Signed)
Office Visit Note   Patient: Lauren [name]           Date of Birth: 08/15/1981           MRN: [id number] Visit Date: 02/19/2020              Requested by: Lucky Cowboy, MD [address] [place],  [place] [address] PCP: Lucky Cowboy, MD   Assessment & Plan: Visit Diagnoses:  1. Carpal tunnel syndrome, bilateral   2. Trigger thumb, left thumb     Plan: Mrs. Rembert has evidence of moderate right carpal tunnel syndrome by EMGs and nerve conduction studies performed by Dr. Alvester Morin.  Study on the left was negative.  Continues to have trouble writing and sleeping on the right side despite time and splinting.  She like to proceed with surgery.  Also having a difficult time with a left trigger thumb.  She like to proceed with surgery at that thumb as well.  Will schedule  Follow-Up Instructions: Return We will schedule right carpal tunnel release and left trigger thumb release.   Orders:  No orders of the defined types were placed in this encounter.  No orders of the defined types were placed in this encounter.     Procedures: No procedures performed   Clinical Data: No additional findings.   Subjective: Chief Complaint  Patient presents with  . Right Hand - Follow-up    EMG review  . Left Hand - Follow-up  Patient presents today for a follow up on both her hands. She had an EMG study with Dr.Newton on 02/14/2020, and is here today to go over those results.  HPI  Review of Systems   Objective: Vital Signs: Ht 5\' 1"  (1.549 m)   Wt 199 lb (90.3 kg)   BMI 37.60 kg/m   Physical Exam Constitutional:      Appearance: She is well-developed.  Eyes:     Pupils: Pupils are equal, round, and reactive to light.  Pulmonary:     Effort: Pulmonary effort is normal.  Skin:    General: Skin is warm and dry.  Neurological:     Mental Status: She is alert and oriented to person, place, and time.  Psychiatric:        Behavior: Behavior normal.      Ortho Exam awake alert and oriented x3.  Comfortable sitting.  Positive Tinel's over the median nerve of the right wrist and to lesser extent the left wrist.  Good opposition of thumb to little finger bilaterally.  No loss of motion.  No dorsal wrist pain.  Painful nodule on the palmar aspect of the left thumb at the level of the metacarpal phalangeal joint.  Has been wearing a splint to that finger for over a month and does not have active triggering but is quite apprehensive.  Neurologically intact  Specialty Comments:  No specialty comments available.  Imaging: No results found.   PMFS History: Patient Active Problem List   Diagnosis Date Noted  . Trigger thumb, left thumb 01/28/2020  . Menorrhagia with irregular cycle 01/22/2020  . Urine pregnancy test negative 01/22/2020  . Uterine enlargement 01/22/2020  . Major depressive disorder, recurrent episode, moderate (H[facility]) 12/03/2019  . Carpal tunnel syndrome, bilateral 06/21/2019  . 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 Liver lesion, left lobe 10/04/2018  . Fibromyalgia 06/04/2018  . Obstructive sleep apnea treated with continuous positive airway pressure (CPAP) 05/23/2018  . 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 Hypersomnia with sleep  apnea 01/16/2018  . Hepatomegaly 12/21/2017  . Hepatic steatosis 12/21/2017  . Headache associated with sexual activity 03/08/2017  . Dizziness 03/08/2017  . Medication management 09/09/2015  . Abnormal glucose 06/02/2015  . Hyperlipidemia LDL goal <100 06/02/2015  . Morbid obesity ([facility]) 06/02/2015  . Depression   . Hypertension   . Hypothyroidism 04/30/2012  . 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 Paraparesis ([place]) 02/02/2012   Past Medical History:  Diagnosis Date  . Anemia   . Anxiety   . Constipation   . Depression   . Fibromyalgia   . GERD (gastroesophageal reflux disease)   . Hemorrhoids   . HSV-1 infection   . Hypertension   . Hypothyroidism   . OSA on CPAP   .  Other abnormal glucose   . Preeclampsia     Family History  Problem Relation Age of Onset  . Hypothyroidism Mother   . Cervical cancer Mother   . Bipolar disorder Mother   . Diabetes Mother   . Colon polyps Mother   . Bipolar disorder Brother   . Kidney disease Brother   . Alcohol abuse Brother   . Hypothyroidism Daughter   . Heart disease Maternal Grandmother   . Diabetes Maternal Grandmother   . Cancer Maternal Grandmother        SKIN  . Hyperlipidemia Maternal Grandmother   . Hypertension Maternal Grandmother   . Hypothyroidism Maternal Grandmother   . Lung cancer Maternal Grandfather   . Heart disease Maternal Aunt   . Diabetes Maternal Aunt   . Diabetes Cousin   . Colon cancer Neg Hx     Past Surgical History:  Procedure Laterality Date  . CHOLECYSTECTOMY  2006  . TONSILLECTOMY    . WISDOM TOOTH EXTRACTION     Social History   Occupational History  . Occupation: Product manager: [employer]  Tobacco Use  . Smoking status: Never Smoker  . Smokeless tobacco: Never Used  Substance and Sexual Activity  . Alcohol use: Yes    Alcohol/week: 0.0 standard drinks    Comment: occ  . Drug use: No  . Sexual activity: Yes    Partners: Male    Birth control/protection: None, Surgical    Comment: husband has had a vasectomy

## 2020-02-20 HISTORY — PX: CARPAL TUNNEL RELEASE: SHX101

## 2020-02-25 ENCOUNTER — Encounter: Payer: BC Managed Care – PPO | Admitting: Physical Medicine and Rehabilitation

## 2020-02-26 ENCOUNTER — Encounter: Payer: Self-pay | Admitting: Neurology

## 2020-02-26 ENCOUNTER — Telehealth: Payer: Self-pay | Admitting: Neurology

## 2020-02-26 ENCOUNTER — Other Ambulatory Visit: Payer: Self-pay | Admitting: Neurology

## 2020-02-26 DIAGNOSIS — Z9989 Dependence on other enabling machines and devices: Secondary | ICD-10-CM

## 2020-02-26 DIAGNOSIS — G4733 Obstructive sleep apnea (adult) (pediatric): Secondary | ICD-10-CM

## 2020-02-26 NOTE — Telephone Encounter (Signed)
New orders sent for the patient to [facility].

## 2020-02-26 NOTE — Telephone Encounter (Signed)
Pt called wanting to know if she can get a prescription for a new cpap mask. Please advise.

## 2020-02-27 ENCOUNTER — Other Ambulatory Visit: Payer: Self-pay | Admitting: Orthopedic Surgery

## 2020-02-27 DIAGNOSIS — G5601 Carpal tunnel syndrome, right upper limb: Secondary | ICD-10-CM

## 2020-02-27 DIAGNOSIS — M65311 Trigger thumb, right thumb: Secondary | ICD-10-CM

## 2020-02-27 MED ORDER — HYDROCODONE-ACETAMINOPHEN 5-325 MG PO TABS: 1.0000 | ORAL_TABLET | ORAL | 0 refills | Status: DC | PRN

## 2020-03-04 ENCOUNTER — Ambulatory Visit: Payer: BC Managed Care – PPO | Admitting: Family Medicine

## 2020-03-04 ENCOUNTER — Other Ambulatory Visit: Payer: Self-pay

## 2020-03-04 ENCOUNTER — Ambulatory Visit (INDEPENDENT_AMBULATORY_CARE_PROVIDER_SITE_OTHER): Payer: BC Managed Care – PPO | Admitting: Orthopaedic Surgery

## 2020-03-04 ENCOUNTER — Encounter: Payer: Self-pay | Admitting: Orthopaedic Surgery

## 2020-03-04 VITALS — Ht 61.0 in | Wt 199.0 lb

## 2020-03-04 DIAGNOSIS — M65312 Trigger thumb, left thumb: Secondary | ICD-10-CM

## 2020-03-04 DIAGNOSIS — G5603 Carpal tunnel syndrome, bilateral upper limbs: Secondary | ICD-10-CM

## 2020-03-04 NOTE — Progress Notes (Signed)
Office Visit Note   Patient: Lauren [name]           Date of Birth: [birth year]           MRN: [id number] Visit Date: 03/04/2020              Requested by: Unk Pinto, [address] [place],  [place] [address] PCP: Unk Pinto, MD   Assessment & Plan: Visit Diagnoses:  1. Trigger thumb, left thumb   2. Carpal tunnel syndrome, bilateral     Plan: Lauren [name] is 6 days status post right carpal tunnel release and release of a left trigger thumb.  Doing well without evidence of complication.  Dressings were removed and waterproof Band-Aids applied.  She will wear the splint to her right wrist.  I will see her back next week and plan removing the stitches.  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  She may have some difficulty and if she feels like it is going to be a problem she will call and we will keep her out of work another week  Follow-Up Instructions: Return in about 1 week (around 03/11/2020).   Orders:  No orders of the defined types were placed in this encounter.  No orders of the defined types were placed in this encounter.     Procedures: No procedures performed   Clinical Data: No additional findings.   Subjective: Chief Complaint  Patient presents with  . Right Wrist - Follow-up    Right carpal tunnel release 02/27/2020  . Left Hand - Follow-up  Patient presents today for follow up on her right wrist and left hand. She had surgery on 02/27/2020 for right carpal tunnel release and left thumb trigger finger release. She is now 6 days out from surgery. Patient states that she is doing well. She is not taking anything for pain.  HPI  Review of Systems   Objective: Vital Signs: Ht 5\' 1"  (1.549 m)   Wt 199 lb (90.3 kg)   BMI 37.60 kg/m   Physical Exam  Ortho Exam incisions to right wrist and left thumb are healing without problem.  Neurologically intact.  No swelling.  Feeling much better than her  right hand after the carpal tunnel release.  No active triggering of the left thumb. Specialty Comments:  No specialty comments available.  Imaging: No results found.   PMFS History: Patient Active Problem List   Diagnosis Date Noted  . Trigger thumb, left thumb 01/28/2020  . Menorrhagia with irregular cycle 01/22/2020  . Urine pregnancy test negative 01/22/2020  . Uterine enlargement 01/22/2020  . Major depressive disorder, recurrent episode, moderate ([place]) 12/03/2019  . Carpal tunnel syndrome, bilateral 06/21/2019  . 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 Liver lesion, left lobe 10/04/2018  . Fibromyalgia 06/04/2018  . Obstructive sleep apnea treated with continuous positive airway pressure (CPAP) 05/23/2018  . 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 Hypersomnia with sleep apnea 01/16/2018  . Hepatomegaly 12/21/2017  . Hepatic steatosis 12/21/2017  . Headache associated with sexual activity 03/08/2017  . Dizziness 03/08/2017  . Medication management 09/09/2015  . Abnormal glucose 06/02/2015  . Hyperlipidemia LDL goal <100 06/02/2015  . Morbid obesity ([place]) 06/02/2015  . Depression   . Hypertension   . Hypothyroidism 04/30/2012  . 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 Paraparesis ([place]) 02/02/2012   Past Medical History:  Diagnosis Date  . Anemia   . Anxiety   . Constipation   .  Depression   . Fibromyalgia   . GERD (gastroesophageal reflux disease)   . Hemorrhoids   . HSV-1 infection   . Hypertension   . Hypothyroidism   . OSA on CPAP   . Other abnormal glucose   . Preeclampsia     Family History  Problem Relation Age of Onset  . Hypothyroidism Mother   . Cervical cancer Mother   . Bipolar disorder Mother   . Diabetes Mother   . Colon polyps Mother   . Bipolar disorder Brother   . Kidney disease Brother   . Alcohol abuse Brother   . Hypothyroidism Daughter   . Heart disease Maternal Grandmother   . Diabetes Maternal Grandmother   . Cancer Maternal  Grandmother        SKIN  . Hyperlipidemia Maternal Grandmother   . Hypertension Maternal Grandmother   . Hypothyroidism Maternal Grandmother   . Lung cancer Maternal Grandfather   . Heart disease Maternal Aunt   . Diabetes Maternal Aunt   . Diabetes Cousin   . Colon cancer Neg Hx     Past Surgical History:  Procedure Laterality Date  . CHOLECYSTECTOMY  2006  . TONSILLECTOMY    . WISDOM TOOTH EXTRACTION     Social History   Occupational History  . Occupation: Magazine features editor: [employer]  Tobacco Use  . Smoking status: Never Smoker  . Smokeless tobacco: Never Used  Substance and Sexual Activity  . Alcohol use: Yes    Alcohol/week: 0.0 standard drinks    Comment: occ  . Drug use: No  . Sexual activity: Yes    Partners: Male    Birth control/protection: None, Surgical    Comment: husband has had a vasectomy

## 2020-03-05 ENCOUNTER — Telehealth: Payer: Self-pay | Admitting: Orthopaedic Surgery

## 2020-03-05 NOTE — Telephone Encounter (Signed)
Please advise if this is okay?

## 2020-03-05 NOTE — Telephone Encounter (Signed)
Called and left message for patient that work note is up front for pick up.

## 2020-03-05 NOTE — Telephone Encounter (Signed)
Ok for note 

## 2020-03-05 NOTE — Telephone Encounter (Signed)
Patient called requesting a note to take her out of work until her next appointment with Dr. Cleophas Dunker.  She stated that she tried to drive yesterday and was not able to.  Patient would like a call back when the note is ready for pick up.  CB#[phone].  Thank you.

## 2020-03-12 ENCOUNTER — Other Ambulatory Visit: Payer: Self-pay

## 2020-03-12 ENCOUNTER — Encounter: Payer: Self-pay | Admitting: Orthopaedic Surgery

## 2020-03-12 ENCOUNTER — Ambulatory Visit (INDEPENDENT_AMBULATORY_CARE_PROVIDER_SITE_OTHER): Payer: BC Managed Care – PPO | Admitting: Orthopaedic Surgery

## 2020-03-12 DIAGNOSIS — G5603 Carpal tunnel syndrome, bilateral upper limbs: Secondary | ICD-10-CM

## 2020-03-12 DIAGNOSIS — M65312 Trigger thumb, left thumb: Secondary | ICD-10-CM

## 2020-03-12 NOTE — Progress Notes (Signed)
Office Visit Note   Patient: Lauren [name]           Date of Birth: 1980/12/09           MRN: [id number] Visit Date: 03/12/2020              Requested by: Unk Pinto, [address] [place],  [place] [address] PCP: Unk Pinto, MD   Assessment & Plan: Visit Diagnoses:  1. Carpal tunnel syndrome, bilateral   2. Trigger thumb, left thumb     Plan: 2 weeks status post right carpal tunnel release and left trigger thumb release.  Doing quite well.  Remaining stitches were removed.  Will wear the splint for 2 weeks to the right wrist.  Okay to return to work tomorrow with a limited capacity which we have discussed.  Office 2 weeks  Follow-Up Instructions: Return in about 2 weeks (around 03/26/2020).   Orders:  No orders of the defined types were placed in this encounter.  No orders of the defined types were placed in this encounter.     Procedures: No procedures performed   Clinical Data: No additional findings.   Subjective: Chief Complaint  Patient presents with  . Right Wrist - Follow-up, Routine Post Op    Right carpal tunnel release DOS 02/27/2020 Left trigger thumb release DOS 02/27/2020  Patient presents today for follow up on her right wrist. She is now two weeks out from right wrist carpal tunnel release and left trigger thumb release. Her surgery was on 02/27/2020. She states that it feels like something is pulling when she stretches her hands out. She thinks it may be just coming from the stitches that are still in place. She has a wrist splint for the right side but left it at home.   HPI  Review of Systems   Objective: Vital Signs: There were no vitals taken for this visit.  Physical Exam  Ortho Exam right carpal tunnel incision and left trigger thumb incision healing without problem.  Remaining stitches removed.  No swelling.  Neurologically intact.  Normal sensation to all of the digits in both hands  Specialty Comments:    No specialty comments available.  Imaging: No results found.   PMFS History: Patient Active Problem List   Diagnosis Date Noted  . Trigger thumb, left thumb 01/28/2020  . Menorrhagia with irregular cycle 01/22/2020  . Urine pregnancy test negative 01/22/2020  . Uterine enlargement 01/22/2020  . Major depressive disorder, recurrent episode, moderate ([facility]) 12/03/2019  . Carpal tunnel syndrome, bilateral 06/21/2019  . 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 Liver lesion, left lobe 10/04/2018  . Fibromyalgia 06/04/2018  . Obstructive sleep apnea treated with continuous positive airway pressure (CPAP) 05/23/2018  . 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 Hypersomnia with sleep apnea 01/16/2018  . Hepatomegaly 12/21/2017  . Hepatic steatosis 12/21/2017  . Headache associated with sexual activity 03/08/2017  . Dizziness 03/08/2017  . Medication management 09/09/2015  . Abnormal glucose 06/02/2015  . Hyperlipidemia LDL goal <100 06/02/2015  . Morbid obesity ([facility]) 06/02/2015  . Depression   . Hypertension   . Hypothyroidism 04/30/2012  . 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 Paraparesis ([facility]) 02/02/2012   Past Medical History:  Diagnosis Date  . Anemia   . Anxiety   . Constipation   . Depression   . Fibromyalgia   . GERD (gastroesophageal reflux disease)   . Hemorrhoids   . HSV-1 infection   . Hypertension   . Hypothyroidism   .  OSA on CPAP   . Other abnormal glucose   . Preeclampsia     Family History  Problem Relation Age of Onset  . Hypothyroidism Mother   . Cervical cancer Mother   . Bipolar disorder Mother   . Diabetes Mother   . Colon polyps Mother   . Bipolar disorder Brother   . Kidney disease Brother   . Alcohol abuse Brother   . Hypothyroidism Daughter   . Heart disease Maternal Grandmother   . Diabetes Maternal Grandmother   . Cancer Maternal Grandmother        SKIN  . Hyperlipidemia Maternal Grandmother   . Hypertension Maternal Grandmother   .  Hypothyroidism Maternal Grandmother   . Lung cancer Maternal Grandfather   . Heart disease Maternal Aunt   . Diabetes Maternal Aunt   . Diabetes Cousin   . Colon cancer Neg Hx     Past Surgical History:  Procedure Laterality Date  . CHOLECYSTECTOMY  2006  . TONSILLECTOMY    . WISDOM TOOTH EXTRACTION     Social History   Occupational History  . Occupation: Magazine features editor: [employer]  Tobacco Use  . Smoking status: Never Smoker  . Smokeless tobacco: Never Used  Substance and Sexual Activity  . Alcohol use: Yes    Alcohol/week: 0.0 standard drinks    Comment: occ  . Drug use: No  . Sexual activity: Yes    Partners: Male    Birth control/protection: None, Surgical    Comment: husband has had a vasectomy

## 2020-03-23 ENCOUNTER — Other Ambulatory Visit: Payer: Self-pay

## 2020-03-23 ENCOUNTER — Other Ambulatory Visit: Payer: Self-pay | Admitting: Physician Assistant

## 2020-03-23 DIAGNOSIS — K2100 Gastro-esophageal reflux disease with esophagitis, without bleeding: Secondary | ICD-10-CM

## 2020-03-23 DIAGNOSIS — R110 Nausea: Secondary | ICD-10-CM

## 2020-03-23 DIAGNOSIS — R5383 Other fatigue: Secondary | ICD-10-CM

## 2020-03-23 MED ORDER — AMPHETAMINE-DEXTROAMPHET ER 10 MG PO CP24: 10.0000 mg | ORAL_CAPSULE | Freq: Every day | ORAL | 0 refills | Status: DC

## 2020-03-26 ENCOUNTER — Ambulatory Visit (INDEPENDENT_AMBULATORY_CARE_PROVIDER_SITE_OTHER): Payer: BC Managed Care – PPO | Admitting: Orthopaedic Surgery

## 2020-03-26 ENCOUNTER — Encounter: Payer: Self-pay | Admitting: Orthopaedic Surgery

## 2020-03-26 ENCOUNTER — Other Ambulatory Visit: Payer: Self-pay

## 2020-03-26 VITALS — Ht 61.0 in | Wt 199.0 lb

## 2020-03-26 DIAGNOSIS — M65312 Trigger thumb, left thumb: Secondary | ICD-10-CM

## 2020-03-26 DIAGNOSIS — G5603 Carpal tunnel syndrome, bilateral upper limbs: Secondary | ICD-10-CM

## 2020-03-26 NOTE — Progress Notes (Signed)
Office Visit Note   Patient: Lauren [name]           Date of Birth: 02-16-1981           MRN: [id number] Visit Date: 03/26/2020              Requested by: Lucky Cowboy, MD [address] [place],  [place] [address] PCP: Lucky Cowboy, MD   Assessment & Plan: Visit Diagnoses:  1. Carpal tunnel syndrome, bilateral   2. Trigger thumb, left thumb     Plan:  #1:  Continue to massage the scars #2:  Increase activities as tolerated #3:  Advil or aleve for stiffness   Follow-Up Instructions: No follow-ups on file.   Orders:  No orders of the defined types were placed in this encounter.  No orders of the defined types were placed in this encounter.     Procedures: No procedures performed   Clinical Data: No additional findings.   Subjective: Chief Complaint  Patient presents with  . Right Wrist - Follow-up    Right carpal tunnel release DOS 02/27/2020  . Left Hand - Follow-up    Left trigger thumb release DOS 02/27/2020  Patient presents today for a two week follow up on her right wrist wrist and left thumb. She had a right carpal tunnel release and left trigger thumb release one month ago. Her surgery was on 02/27/2020. She states that she is still really sore and questions if that is normal at this point. She takes Tylenol as needed. No numbness or tingling in either hand.   HPI  Review of Systems   Objective: Vital Signs: Ht 5\' 1"  (1.549 m)   Wt 199 lb (90.3 kg)   BMI 37.60 kg/m   Physical Exam  Ortho Exam  Specialty Comments:  No specialty comments available.  Imaging: No results found.   PMFS History: Patient Active Problem List   Diagnosis Date Noted  . Trigger thumb, left thumb 01/28/2020  . Menorrhagia with irregular cycle 01/22/2020  . Urine pregnancy test negative 01/22/2020  . Uterine enlargement 01/22/2020  . Major depressive disorder, recurrent episode, moderate ([facility]) 12/03/2019  . Carpal tunnel syndrome,  bilateral 06/21/2019  . 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 Liver lesion, left lobe 10/04/2018  . Fibromyalgia 06/04/2018  . Obstructive sleep apnea treated with continuous positive airway pressure (CPAP) 05/23/2018  . 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 Hypersomnia with sleep apnea 01/16/2018  . Hepatomegaly 12/21/2017  . Hepatic steatosis 12/21/2017  . Headache associated with sexual activity 03/08/2017  . Dizziness 03/08/2017  . Medication management 09/09/2015  . Abnormal glucose 06/02/2015  . Hyperlipidemia LDL goal <100 06/02/2015  . Morbid obesity ([facility]) 06/02/2015  . Depression   . Hypertension   . Hypothyroidism 04/30/2012  . 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 Paraparesis ([facility]) 02/02/2012   Past Medical History:  Diagnosis Date  . Anemia   . Anxiety   . Constipation   . Depression   . Fibromyalgia   . GERD (gastroesophageal reflux disease)   . Hemorrhoids   . HSV-1 infection   . Hypertension   . Hypothyroidism   . OSA on CPAP   . Other abnormal glucose   . Preeclampsia     Family History  Problem Relation Age of Onset  . Hypothyroidism Mother   . Cervical cancer Mother   . Bipolar disorder Mother   . Diabetes Mother   . Colon polyps Mother   . Bipolar disorder Brother   .  Kidney disease Brother   . Alcohol abuse Brother   . Hypothyroidism Daughter   . Heart disease Maternal Grandmother   . Diabetes Maternal Grandmother   . Cancer Maternal Grandmother        SKIN  . Hyperlipidemia Maternal Grandmother   . Hypertension Maternal Grandmother   . Hypothyroidism Maternal Grandmother   . Lung cancer Maternal Grandfather   . Heart disease Maternal Aunt   . Diabetes Maternal Aunt   . Diabetes Cousin   . Colon cancer Neg Hx     Past Surgical History:  Procedure Laterality Date  . CHOLECYSTECTOMY  2006  . TONSILLECTOMY    . WISDOM TOOTH EXTRACTION     Social History   Occupational History  . Occupation: Product manager: [employer]  Tobacco Use  . Smoking status: Never Smoker  . Smokeless tobacco: Never Used  Substance and Sexual Activity  . Alcohol use: Yes    Alcohol/week: 0.0 standard drinks    Comment: occ  . Drug use: No  . Sexual activity: Yes    Partners: Male    Birth control/protection: None, Surgical    Comment: husband has had a vasectomy

## 2020-03-30 ENCOUNTER — Encounter: Payer: Self-pay | Admitting: Neurology

## 2020-04-01 ENCOUNTER — Ambulatory Visit: Payer: BC Managed Care – PPO | Admitting: Neurology

## 2020-04-01 ENCOUNTER — Encounter: Payer: Self-pay | Admitting: Neurology

## 2020-04-01 ENCOUNTER — Other Ambulatory Visit: Payer: Self-pay

## 2020-04-01 ENCOUNTER — Other Ambulatory Visit: Payer: Self-pay | Admitting: Neurology

## 2020-04-01 VITALS — BP 114/82 | HR 89 | Temp 97.4°F | Ht 61.0 in | Wt 191.0 lb

## 2020-04-01 DIAGNOSIS — G47421 Narcolepsy in conditions classified elsewhere with cataplexy: Secondary | ICD-10-CM

## 2020-04-01 DIAGNOSIS — G4710 Hypersomnia, unspecified: Secondary | ICD-10-CM

## 2020-04-01 DIAGNOSIS — G47419 Narcolepsy without cataplexy: Secondary | ICD-10-CM

## 2020-04-01 DIAGNOSIS — R5383 Other fatigue: Secondary | ICD-10-CM

## 2020-04-01 MED ORDER — AMPHETAMINE-DEXTROAMPHETAMINE 10 MG PO TABS: 10.0000 mg | ORAL_TABLET | Freq: Every day | ORAL | 0 refills | Status: DC

## 2020-04-01 NOTE — Addendum Note (Signed)
Addended by: Melvyn Novas on: 04/01/2020 04:02 PM   Modules accepted: Orders

## 2020-04-01 NOTE — Addendum Note (Signed)
Addended by: Melvyn Novas on: 04/01/2020 03:57 PM   Modules accepted: Orders

## 2020-04-01 NOTE — Patient Instructions (Signed)
Narcolepsy Narcolepsy is a neurological disorder that causes people to fall asleep suddenly and without control (have sleep attacks) during the daytime. It is a lifelong disorder. Narcolepsy disrupts the sleep cycle at night, which then causes daytime sleepiness. What are the causes? The cause of narcolepsy is not fully understood, but it may be related to:  Low levels of hypocretin, a chemical (neurotransmitter) in the brain that controls sleep and wake cycles. Hypocretin imbalance may be caused by: ? Abnormal genes that are passed from parent to child (inherited). ? An autoimmune disease in which the body's defense system (immune system) attacks the brain cells that make hypocretin.  Infection, tumor, or injury in the area of the brain that controls sleep.  Exposure to poisons (toxins), such as heavy metals, pesticides, and secondhand smoke. What are the signs or symptoms? Symptoms of this condition include:  Excessive daytime sleepiness. This is the most common symptom and is usually the first symptom you will notice. This may affect your performance at work or school.  Sleep attacks. You may fall asleep in the middle of an activity, especially low-energy activities like reading or watching TV.  Feeling like you cannot think clearly and trouble focusing or remembering things. You may also feel depressed.  Sudden muscle weakness (cataplexy). When this occurs, your speech may become slurred, or your knees may buckle. Cataplexy is usually triggered by surprise, anger, fear, or laughter.  Losing the ability to speak or move (sleep paralysis). This may occur just as you start to fall asleep or wake up. You will be aware of the paralysis. It usually lasts for just a few seconds or minutes.  Seeing, hearing, tasting, smelling, or feeling things that are not real (hallucinations). Hallucinations may occur with sleep paralysis. They can happen when you are falling asleep, waking up, or  dozing.  Trouble staying asleep at night (insomnia) and restless sleep. How is this diagnosed? This condition may be diagnosed based on:  A physical exam to rule out any other problems that may be causing your symptoms. You may be asked to write down your sleeping patterns for several weeks in a sleep diary. This will help your health care provider make a diagnosis.  Sleep studies that measure how well your REM sleep is regulated. These tests also measure your heart rate, breathing, movement, and brain waves. These tests include: ? An overnight sleep study (polysomnogram). ? A daytime sleep study that is done while you take several naps during the day (multiple sleep latency test, MSLT). This test measures how quickly you fall asleep and how quickly you enter REM sleep.  Removal of spinal fluid to measure hypocretin levels. How is this treated? There is no cure for this condition, but treatment can help relieve symptoms. Treatment may include:  Lifestyle and sleeping strategies to help you cope with the condition, such as: ? Exercising regularly. ? Maintaining a regular sleep schedule. ? Avoiding caffeine and large meals before bed.  Medicines. These may include: ? Medicines that help keep you awake and alert (stimulants) to fight daytime sleepiness. ? Medicines that treat depression (antidepressants). These may be used to treat cataplexy. ? Sodium oxybate. This is a strong medicine to help you relax (sedative) that you may take at night. It can help control daytime sleepiness and cataplexy.  Other treatments may include mental health counseling or joining a support group. Follow these instructions at home: Sleeping habits   Get about 8 hours of sleep every night.  Go to   sleep and get up at about the same time every day.  Keep your bedroom dark, quiet, and comfortable.  When you feel very tired, take short naps. Schedule naps so that you take them at about the same time every  day.  Before bedtime: ? Avoid bright lights and screens. ? Relax. Try activities like reading or taking a warm bath. Activity  Get at least 20 minutes of exercise every day. This will help you sleep better at night and reduce daytime sleepiness.  Avoid exercising within 3 hours of bedtime.  Do not drive or use heavy machinery if you are sleepy. If possible, take a nap before driving.  Do not swim or go out on the water without a life jacket. Eating and drinking  Do not drink alcohol or caffeinated beverages within 4-5 hours of bedtime.  Do not eat a large meal before bedtime.  Eat meals at about the same times every day. General instructions   Take over-the-counter and prescription medicines only as told by your health care provider.  Keep a sleep diary as told by your health care provider.  Tell your employer or teachers that you have narcolepsy. You may be able to adjust your schedule to include time for naps.  Do not use any products that contain nicotine or tobacco, such as cigarettes, e-cigarettes, and chewing tobacco. If you need help quitting, ask your health care provider.  Keep all follow-up visits as told by your health care provider. This is important. Where to find more information  National Institute of Neurological Disorders: www.ninds.nih.gov Contact a health care provider if:  Your symptoms are not getting better.  You have increasingly high blood pressure (hypertension).  You have changes in your heart rhythm.  You are having a hard time determining what is real and what is not (psychosis). Get help right away if you:  Hurt yourself during a sleep attack or an attack of cataplexy.  Have chest pain.  Have trouble breathing. These symptoms may represent a serious problem that is an emergency. Do not wait to see if the symptoms will go away. Get medical help right away. Call your local emergency services (911 in the [place] Do not drive yourself to the  hospital. Summary  Narcolepsy is a neurological disorder that causes people to fall asleep suddenly, and without control, during the daytime (sleep attacks). It is a lifelong disorder.  There is no cure for this condition, but treatment can help relieve symptoms.  Go to sleep and get up at about the same time every day. Follow instructions about sleep and activities as told by your health care provider.  Take over-the-counter and prescription medicines only as told by your health care provider. This information is not intended to replace advice given to you by your health care provider. Make sure you discuss any questions you have with your health care provider. Document Revised: 06/19/2019 Document Reviewed: 06/19/2019 Elsevier Patient Education  2020 Elsevier Inc.  

## 2020-04-01 NOTE — Progress Notes (Signed)
SLEEP MEDICINE CLINIC   Provider:  Larey Seat, M D  Primary Care Physician:  Unk Pinto, MD   Referring Provider: Unk Pinto, MD   Chief Complaint  Patient presents with  . Follow-up    pt with husband, rm 40. presents today as a follow up. DME [facility] for machine and is working well. pt is still having concerns of daytime sleepiness. states that she will take the XR med in the morning which it helps but still feels tired. she will take the immediate release at lunch time and will again helps her stay alert until 7/7:30 pm but she feels the medication is allowing her to be able to function but she still feels tired.     HPI:   RV 04-01-2020 :  Lauren [name] is a 39 y.o. female , and seen here in a Rv  for a hypersomnia evaluation- RN : pt with husband, rm 61. presents today as a follow up. DME [facility] -CPAP  is working well. She is still having concerns of daytime sleepiness. states that she will take the [name]  in the morning which it helps but still feels tired.  she will take the immediate release at lunch time and will again helps her stay alert until 7/7:30 pm but she feels the medication is allowing her to be able to function but she still feels tired.   The patient has been an excellent user of CPAP she is using an auto titration device between 5 and 12 cm of water pressure with 3 cm EPR and her 95th percentile pressure is 7.3 cm water.  Residual AHI is 0.2 which is an excellent resolution of any apnea.  She is 100% compliant for 30 out of 30 days of use and 97% compliance with 4 hours of use.  She had only 1 day where she did not use 4 hours consecutively.  The average user time is 8 hours and 3 minutes at night there are very minor air leaks overall this is very effective therapy but the past to my concern and the patient's not helped to make her less sleepy in daytime.  She remains on [name] with an Epworth Sleepiness Scale result of 12 points  today. There has been no interval medical history-  She takes 20 mg [name] and 10 mg in PM.  She is a very light sleeper.  She feels that CPAP prevents her from waking up as frequently. She averages 8- 10 hours of sleep. She takes an iron supplement- her % saturation was only 10% in 10-2019.  Component 2 yr ago  DQA1*01:02 Positive   DQB1*06:02 Positive   Comment: Final Results:  DQA1*01:AXZVC,03:RV    MSLT may be next step now- she can wean off medication that depresses REM sleep.  We need to wean off [name], [name], [name],   In preparation for an MSLT during the summer break I would like for the patient to temporarily take [name] and extended release so that she can use it on a as needed basis and it would be sufficient for a valid MSLT to discontinue the use 8 days prior to the sleep test.  As to [name] the patient only uses 5 mg which is a health tablet at her currently prescribed dose of 10 mg tablets.  She can go to every other day 5 mg for 1 week and then should be able to stay off [name] for another 10 days prior to test.  It is  very important that we schedule the MSLT so that these steps can be planned correctly in advance, I have to speak to Dr. Robina Ade about the [name] and how to best be met and not as familiar with the medication and its half-life.  Since it is also a tablet it should be possible to cut the dose in half and go to every other day but I will confirm with Dr. Buddy Duty that this is what she permits me to do.  My goal is to have the MSLT and early mid June.  So that we have time to wean during the school summer break.    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  Lauren [name] underwent a sleep study on 19 January 2018 at the time she was treated with Xanax, Pristiq, Latuda Pravachol and Synthroid.  She also had a BMI of 37 and an Epworth sleepiness scale was endorsed at 11 points.  She experienced temporary spells of weakness and daily hypersomnia.  Her AHI  was 3.1/h  the REM AHI was 7.5 usually these levels of sleep apnea are not considered clinically significant.  However there was a quite loud primary snoring noted and the patient still had insomnia and hypersomnia concerns.  The patient decided to try out CPAP, and she has been compliant she has been using the machine 27 out of 30 days with a 76% compliance for hours average user time 5 hours 33 minutes AutoSet is between 5 and 12 cmH2O with 3 cm EPR and her AHI was reduced to 0.3 which is a further successful reduction of an already mild apnea it did not have any effect on her sleepiness.  A repeat laboratory test for review with lymphocytic antigen HLA DQ A1 and DQ B1 was obtained on 13 January by [facility] and she to turn positive for both alleles alleles.  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   We discuss the process how to obtain a valid MSLT ;she ried to wean off latuda and failed, she couldn't work , not able to function. She is not sure she likes her CPAP and given the w baseline AHI I am not convinced it can change much. However, the diagnosis of OSA can help [place] to start her on modafinil.  She has been using [name] with Dr Malachy Moan order, she has seen Vicie Mutters, PA.   I will order modafinil or Armodafinil. I will establish a weaning schedule for her REM supressant medications. If she cannot use CPAP 75-80% of the time compliantly , it is not worth to continue.    Dr. Jannifer Franklin patient ,  I had the pleasure of meeting with Lauren [name] before about 6-1/2 years ago she had undergone a sleep study at [facility] sleep.  She has been followed by Dr. Jannifer Franklin at the time who also ordered a sleep study.  In the meantime she has been followed by her primary care physician, and has seen Dr. Adele Schilder.  She has a diagnosis of hypothyroidism, fibromyalgia, excessive fatigue.  She reports that she had been under more stress at work and at home.  The patient is also a mother of 2 with her  youngest son will be 75 years old.  After the birth of either she experienced temporary spells of weakness.  With each of the deliveries she had received magnesium, which can be the most likely cause for perinatal weakness.  Chief complaint according to patient : "depression, leaden fatigue,  weight gain, and myalgia" I sometimes feel it coming down on me and I just need to sleep, have taken 2 naps while waiting here."  Sleep habits are as follows: The patient usually goes to bed around 8:30 PM, she takes Xanax at bedtime and is promptly asleep within 20-30 minutes.  The bedroom is cool, quiet and dark.  She prefers to sleep on her side or prone , and sleeps on one pillow.  I sleeps through the night, wakes up at 6 AM, on weekends she stays much longer in bed -until 10 AM. She estimates 9 hours of sleep on average. She wakes without headaches or dry mouth. She dreams a lot, has one or none bathroom break. Her husband noted her just to have started to snore, and suspected apnea.  She naps frequently in daytime. She works as a Pharmacist, hospital in [employer]. Her naps are 3-5 minutes long.  Excessive daytime sleepiness and fatigue, 3-5 minute power naps, wakes up from her own snoring. Sleep paralysis in the past, none recent. Immediate dreams when napping. She cannot recall cataplectic spells.    Sleep medical history and family sleep history:  Excessive daytime sleepiness and fatigue, 3-5 minute power naps, wakes up from her own snoring. Mother is sleepy, sons are not.   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 Daughters  age 61  and 60. No smoking history, No ETOH, caffeine - two a day.   Review of Systems: Out of a complete 14 system review, the patient complains of only the following symptoms, and all other reviewed systems are negative.  snoring, weight gain, fatigued.   Epworth score  12 on adderall- - but with naps and sleep attacks.  , Fatigue severity score 60  ,  depression score n/a -   How likely are you to doze in the following situations: 0 = not likely, 1 = slight chance, 2 = moderate chance, 3 = high chance  Sitting and Reading? Watching Television? Sitting inactive in a public place (theater or meeting)? Lying down in the afternoon when circumstances permit? Sitting and talking to someone? Sitting quietly after lunch without alcohol? In a car, while stopped for a few minutes in traffic? As a passenger in a car for an hour without a break?  Total = 12/ 24 on adderall if not allowed to nap!      Social History   Socioeconomic History  . Marital status: Married    Spouse name: Not on file  . Number of children: 2  . Years of education: Not on file  . Highest education level: Not on file  Occupational History  . Occupation: Product manager: [employer]  Tobacco Use  . Smoking status: Never Smoker  . Smokeless tobacco: Never Used  Substance and Sexual Activity  . Alcohol use: Yes    Alcohol/week: 0.0 standard drinks    Comment: occ  . Drug use: No  . Sexual activity: Yes    Partners: Male    Birth control/protection: None, Surgical    Comment: husband has had a vasectomy  Other Topics Concern  . Not on file  Social History Narrative   4 caffeine drinks daily    Social Determinants of Health   Financial Resource Strain:   . Difficulty of Paying Living Expenses:   Food Insecurity:   . Worried About Charity fundraiser in the Last Year:   . [place] in the  Last Year:   Transportation Needs:   . Film/video editor (Medical):   Marland Kitchen Lack of Transportation (Non-Medical):   Physical Activity:   . Days of Exercise per Week:   . Minutes of Exercise per Session:   Stress:   . Feeling of Stress :   Social Connections:   . Frequency of Communication with Friends and Family:   . Frequency of Social Gatherings with Friends and Family:   . Attends Religious Services:   . Active Member of Clubs or  Organizations:   . Attends Archivist Meetings:   Marland Kitchen Marital Status:   Intimate Partner Violence:   . Fear of Current or Ex-Partner:   . Emotionally Abused:   Marland Kitchen Physically Abused:   . Sexually Abused:     Family History  Problem Relation Age of Onset  . Hypothyroidism Mother   . Cervical cancer Mother   . Bipolar disorder Mother   . Diabetes Mother   . Colon polyps Mother   . Bipolar disorder Brother   . Kidney disease Brother   . Alcohol abuse Brother   . Hypothyroidism Daughter   . Heart disease Maternal Grandmother   . Diabetes Maternal Grandmother   . Cancer Maternal Grandmother        SKIN  . Hyperlipidemia Maternal Grandmother   . Hypertension Maternal Grandmother   . Hypothyroidism Maternal Grandmother   . Lung cancer Maternal Grandfather   . Heart disease Maternal Aunt   . Diabetes Maternal Aunt   . Diabetes Cousin   . Colon cancer Neg Hx     Past Medical History:  Diagnosis Date  . Anemia   . Anxiety   . Constipation   . Depression   . Fibromyalgia   . GERD (gastroesophageal reflux disease)   . Hemorrhoids   . HSV-1 infection   . Hypertension   . Hypothyroidism   . OSA on CPAP   . Other abnormal glucose   . Preeclampsia     Past Surgical History:  Procedure Laterality Date  . CHOLECYSTECTOMY  2006  . TONSILLECTOMY    . WISDOM TOOTH EXTRACTION      Current Outpatient Medications  Medication Sig Dispense Refill  . ALPRAZolam (XANAX) 0.5 MG tablet TAKE 1 TABLET (0.5 MG TOTAL) BY MOUTH AT BEDTIME AS NEEDED. 30 tablet 0  . amphetamine-dextroamphetamine ([name]) 10 MG 24 hr capsule Take 1 capsule (10 mg total) by mouth daily. 30 capsule 0  . amphetamine-dextroamphetamine ([name]) 20 MG 24 hr capsule Take 1 capsule (20 mg total) by mouth daily. 30 capsule 0  . ARIPiprazole ([name]) 10 MG tablet Take one half tablet by mouth daily, 5 mg daily 30 tablet 2  . Cholecalciferol (VITAMIN D3) 125 MCG (5000 UT) CAPS Take 1 capsule by mouth  daily.    . Ferrous Sulfate 140 (45 Fe) MG TBCR Take 1 tablet by mouth daily.    Marland Kitchen L-Methylfolate-Algae (DEPLIN 15 PO) Take by mouth.    . levothyroxine (SYNTHROID) 150 MCG tablet TAKE 1 TABLET BY MOUTH EVERY DAY BEFORE BREAKFAST 90 tablet 1  . pantoprazole (PROTONIX) 40 MG tablet TAKE 1 TABLET BY MOUTH EVERY DAY 30 tablet 1  . rosuvastatin (CRESTOR) 5 MG tablet Take 1 tablet (5 mg total) by mouth at bedtime. 90 tablet 3  . vitamin E 100 UNIT capsule Take by mouth daily.    Marland Kitchen vortioxetine HBr ([name]) 20 MG TABS tablet Take 20 mg by mouth daily.     No  current facility-administered medications for this visit.    Allergies as of 04/01/2020 - Review Complete 04/01/2020  Allergen Reaction Noted  . Lamictal [lamotrigine] Rash 02/11/2016  . Doxycycline Other (See Comments) 09/19/2013  . Effexor [venlafaxine] Other (See Comments) 09/19/2013  . Savella [milnacipran hcl] Other (See Comments) 09/19/2013  . Prilosec [omeprazole] Itching 11/21/2019    Vitals: BP 114/82   Pulse 89   Temp (!) 97.4 F (36.3 C)   Ht 5' 1"  (1.549 m)   Wt 191 lb (86.6 kg)   BMI 36.09 kg/m  Last Weight:  Wt Readings from Last 1 Encounters:  04/01/20 191 lb (86.6 kg)   DTO:IZTI mass index is 36.09 kg/m.     Last Height:   Ht Readings from Last 1 Encounters:  04/01/20 5' 1"  (1.549 m)    Physical exam:  General: The patient is awake, alert and appears not in acute distress. The patient is well groomed. Head: Normocephalic, atraumatic. Neck is supple. Mallampati 5 and pale mucosa. ,  neck circumference: 15,5 " Nasal airflow patent , Retrognathia is not seen. Wore braces and retainer, now bruxism mouth guard.  Cardiovascular:  Regular rate and rhythm, without  murmurs or carotid bruit, and without distended neck veins. Respiratory: Lungs are clear to auscultation. Skin:  Without evidence of edema, or rash Trunk: BMI is 37 .2 Neurologic exam : The patient is awake and alert, oriented to place and time.      Attention span & concentration ability appears normal.  Speech is slowed but  fluent, meek-  without dysarthria, dysphonia or aphasia.  Mood and affect are appropriate.  Cranial nerves: Intact sense of taste and smell. Pupils are equal and briskly reactive to light.  Facial motor strength is symmetric and tongue and uvula move midline. Shoulder shrug was symmetrical.  Motor exam:  Normal tone, muscle bulk and symmetric strength in all extremities. Sensory:  Deferred.  Coordination: Rapid alternating movements in the fingers/hands was normal. Finger-to-nose maneuver  normal without evidence of ataxia, dysmetria or tremor. Gait and station: Patient walks without assistive device. Turns with 3 Steps.  Deep tendon reflexes: in the  upper and lower extremities are  intact.   Assessment:  After physical and neurologic examination, review of laboratory studies,  Personal review of imaging studies, reports of other /same  Imaging studies, results of polysomnography and / or neurophysiology testing and pre-existing records as far as provided in visit., my assessment is ;   1) UARS snoring, mild OSA -Lauren [name] has noted more sleep continuity on CPAP.  Her residal AHI  is excellent . Her sleepiness didn't change . Her HLA test was bi-allelic positive for 2 alleles .   We plan for MSLT to follow.  I spent more than 25  minutes of face to face time and preparation time with the patient.  Greater than 50% of time was spent in counseling and coordination of care. We have discussed the diagnosis and differential and I answered the patient's questions.    Plan:  Treatment plan and additional workup :  weaning off medications. Dr Toy Care was called and will later today give input on the schedule. MSLT for summer 2021.     Larey Seat, MD 4/58/0998, 3:38 PM  Certified in Neurology by ABPN Certified in [place] by [facility] [address] [place], [place] [address]

## 2020-04-02 ENCOUNTER — Other Ambulatory Visit: Payer: Self-pay | Admitting: Neurology

## 2020-04-02 DIAGNOSIS — Z9989 Dependence on other enabling machines and devices: Secondary | ICD-10-CM

## 2020-04-02 DIAGNOSIS — G4710 Hypersomnia, unspecified: Secondary | ICD-10-CM

## 2020-04-02 DIAGNOSIS — G4733 Obstructive sleep apnea (adult) (pediatric): Secondary | ICD-10-CM

## 2020-04-02 DIAGNOSIS — G47419 Narcolepsy without cataplexy: Secondary | ICD-10-CM

## 2020-04-02 NOTE — Addendum Note (Signed)
Addended by: Judi Cong on: 04/02/2020 09:16 AM   Modules accepted: Orders

## 2020-04-07 ENCOUNTER — Telehealth: Payer: Self-pay

## 2020-04-07 NOTE — Telephone Encounter (Signed)
PA done on cover my meds for Amphetamine-Dextroamphetamine 10mg  tablets on cover my meds.Your demographic data has been sent to [facility] successfully. They will respond shortly with your clinical questions and you will be notified by email when available. You can also check for an update later by opening this request from your dashboard. Please do not fax or call [employer] to resubmit this request. If you need assistance, please chat with CoverMyMeds or call [facility] at [phone].

## 2020-04-08 ENCOUNTER — Ambulatory Visit (HOSPITAL_COMMUNITY)
Admission: EM | Admit: 2020-04-08 | Discharge: 2020-04-08 | Disposition: A | Discharge status: Home / Self Care | Payer: No Typology Code available for payment source | Attending: Family Medicine | Admitting: Family Medicine

## 2020-04-08 ENCOUNTER — Other Ambulatory Visit: Payer: Self-pay

## 2020-04-08 ENCOUNTER — Encounter (HOSPITAL_COMMUNITY): Payer: Self-pay

## 2020-04-08 ENCOUNTER — Ambulatory Visit
Admission: RE | Admit: 2020-04-08 | Discharge: 2020-04-08 | Disposition: A | Discharge status: Home / Self Care | Payer: BC Managed Care – PPO | Source: Ambulatory Visit | Attending: Physician Assistant | Admitting: Physician Assistant

## 2020-04-08 DIAGNOSIS — M25531 Pain in right wrist: Secondary | ICD-10-CM

## 2020-04-08 DIAGNOSIS — Z1231 Encounter for screening mammogram for malignant neoplasm of breast: Secondary | ICD-10-CM

## 2020-04-08 NOTE — ED Triage Notes (Signed)
Pt c/o acute onset pain to right wrist. Pt reports that she had carpal tunnel release surgery February 27, 2020 to right wrist. 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 Pt reports hearing and feeling a "pop" to right anterior wrist area.

## 2020-04-08 NOTE — Discharge Instructions (Addendum)
Begin wearing your wrist splint. You may take ibuprofen 800mg  three times daily with food to help with pain and inflammation.

## 2020-04-08 NOTE — Telephone Encounter (Signed)
[facility] [employer]  received a request from your provider for coverage of Amphetamine- Dextroamphetamine 10MG  OR TABS. As long as you remain covered by the [facility] and there are no changes to your plan benefits, this request is approved for the following time period: 04/08/2020 - 04/09/2023

## 2020-04-09 ENCOUNTER — Other Ambulatory Visit: Payer: Self-pay | Admitting: Family Medicine

## 2020-04-09 DIAGNOSIS — M25532 Pain in left wrist: Secondary | ICD-10-CM

## 2020-04-09 DIAGNOSIS — M25531 Pain in right wrist: Secondary | ICD-10-CM

## 2020-04-10 ENCOUNTER — Other Ambulatory Visit: Payer: Self-pay | Admitting: Adult Health Nurse Practitioner

## 2020-04-10 ENCOUNTER — Other Ambulatory Visit: Payer: Self-pay | Admitting: Radiology

## 2020-04-10 ENCOUNTER — Other Ambulatory Visit: Payer: Self-pay | Admitting: Family Medicine

## 2020-04-10 ENCOUNTER — Ambulatory Visit
Admission: RE | Admit: 2020-04-10 | Discharge: 2020-04-10 | Disposition: A | Discharge status: Home / Self Care | Payer: No Typology Code available for payment source | Source: Ambulatory Visit | Attending: Family Medicine | Admitting: Family Medicine

## 2020-04-10 DIAGNOSIS — M25531 Pain in right wrist: Secondary | ICD-10-CM

## 2020-04-11 NOTE — ED Provider Notes (Signed)
Lauren [name]   [id number] 04/08/20 Arrival Time: 8938  ASSESSMENT & PLAN:  1. Right wrist pain     No indication for imaging at this time. She has wrist splint to wear at home. Ibuprofen 800mg  TID with food.  Follow-up Information    Schedule an appointment as soon as possible for a visit  with [place].   Why: [address] [place], [place] [address]  [phone]         To schedule f/u as above. May f/u here as needed. Work note provided.   Reviewed expectations re: course of current medical issues. Questions answered. Outlined signs and symptoms indicating need for more acute intervention. Patient verbalized understanding. After Visit Summary given.  SUBJECTIVE: History from: patient. Lauren [name] is a 39 y.o. female who reports fairly persistent moderate pain of her right wrist. H/O carpal tunnel release on 02/27/2020. Reports student grabbed her wrist and pulled it behind her back today. "Felt a pop" and immediate pain that is continuing. Mild swelling. Symptoms have progressed to a point and plateaued since beginning. Aggravating factors: certain movements and grasping. Alleviating factors: have not been identified. Associated symptoms: none reported. Extremity sensation changes or weakness: none. Self treatment: has not tried OTC therapies.    Past Surgical History:  Procedure Laterality Date  . CARPAL TUNNEL RELEASE Right 02/2020  . CHOLECYSTECTOMY  2006  . TONSILLECTOMY    . WISDOM TOOTH EXTRACTION        OBJECTIVE:  Vitals:   04/08/20 1651  BP: 116/79  Pulse: 94  Resp: 18  Temp: 98.9 F (37.2 C)  TempSrc: Oral  SpO2: 98%    General appearance: alert; no distress HEENT: [place]; AT Neck: supple with FROM Resp: unlabored respirations Extremities: . RUE: warm with well perfused appearance; fairly well localized moderate tenderness over right volar wrist; healed surgical scar; without gross  deformities; swelling: minimal; bruising: minimal; wrist ROM: limited by reported pain CV: brisk extremity capillary refill of RUE; 2+ radial pulse of RUE. Skin: warm and dry; no visible rashes Neurologic: gait normal; normal sensation and strength of RUE Psychological: alert and cooperative; normal mood and affect     Allergies  Allergen Reactions  . Lamictal [Lamotrigine] Rash  . Doxycycline Other (See Comments)    GI UPSET  . Effexor [Venlafaxine] Other (See Comments)    DYSPHORIA  . Savella [Milnacipran Hcl] Other (See Comments)    NO RELIEF  . Prilosec [Omeprazole] Itching    Past Medical History:  Diagnosis Date  . Anemia   . Anxiety   . Constipation   . Depression   . Fibromyalgia   . GERD (gastroesophageal reflux disease)   . Hemorrhoids   . HSV-1 infection   . Hypertension   . Hypothyroidism   . OSA on CPAP   . Other abnormal glucose   . Preeclampsia    Social History   Socioeconomic History  . Marital status: Married    Spouse name: Not on file  . Number of children: 2  . Years of education: Not on file  . Highest education level: Not on file  Occupational History  . Occupation: Product manager: [employer]  Tobacco Use  . Smoking status: Never Smoker  . Smokeless tobacco: Never Used  Substance and Sexual Activity  . Alcohol use: Yes    Alcohol/week: 0.0 standard drinks    Comment: occ  . Drug use: No  . Sexual activity: Yes  Partners: Male    Birth control/protection: None, Surgical    Comment: husband has had a vasectomy  Other Topics Concern  . Not on file  Social History Narrative   4 caffeine drinks daily    Social Determinants of Health   Financial Resource Strain:   . Difficulty of Paying Living Expenses:   Food Insecurity:   . Worried About Programme researcher, broadcasting/film/video in the Last Year:   . Barista in the Last Year:   Transportation Needs:   . Freight forwarder (Medical):   Marland Kitchen Lack of Transportation  (Non-Medical):   Physical Activity:   . Days of Exercise per Week:   . Minutes of Exercise per Session:   Stress:   . Feeling of Stress :   Social Connections:   . Frequency of Communication with Friends and Family:   . Frequency of Social Gatherings with Friends and Family:   . Attends Religious Services:   . Active Member of Clubs or Organizations:   . Attends Banker Meetings:   Marland Kitchen Marital Status:    Family History  Problem Relation Age of Onset  . Hypothyroidism Mother   . Cervical cancer Mother   . Bipolar disorder Mother   . Diabetes Mother   . Colon polyps Mother   . Bipolar disorder Brother   . Kidney disease Brother   . Alcohol abuse Brother   . Hypothyroidism Daughter   . Heart disease Maternal Grandmother   . Diabetes Maternal Grandmother   . Cancer Maternal Grandmother        SKIN  . Hyperlipidemia Maternal Grandmother   . Hypertension Maternal Grandmother   . Hypothyroidism Maternal Grandmother   . Lung cancer Maternal Grandfather   . Heart disease Maternal Aunt   . Diabetes Maternal Aunt   . Diabetes Cousin   . Colon cancer Neg Hx    Past Surgical History:  Procedure Laterality Date  . CARPAL TUNNEL RELEASE Right 02/2020  . CHOLECYSTECTOMY  2006  . TONSILLECTOMY    . WISDOM TOOTH EXTRACTION        Mardella Layman, MD 04/11/20 1135

## 2020-04-14 NOTE — Progress Notes (Signed)
Complete Physical  Assessment and Plan:  Encounter for general adult medical examination with abnormal findings 1 year  Essential hypertension - continue medications, DASH diet, exercise and monitor at home. Call if greater than 130/80.  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   Hepatic steatosis -     COMPLETE METABOLIC PANEL WITH GFR Check labs, avoid tylenol, alcohol, weight loss advised.   Hepatomegaly Check labs, avoid tylenol, alcohol, weight loss advised.    Morbid obesity ([place] - follow up 3 months for progress monitoring - increase veggies, decrease carbs - long discussion about weight loss, diet, and exercise  Recurrent major depressive disorder, in partial remission ([place] Continue follow up Dr. KToy Care getting off meds for narcolepsy test For depression and narcolepsy  Hyperlipidemia LDL goal <100 -     Lipid panel check lipids decrease fatty foods increase activity.   Abnormal glucose -     Hemoglobin A1c Discussed disease progression and risks Discussed diet/exercise, weight management and risk modification Patient is on abilify  Medication management -     Magnesium  Fibromyalgia Check labs Increase movement  Liver lesion, left lobe Monitor  Myalgia ? FM, check labs Has had negative ANA, antiDNA etc Check RA, check labs Lower back pain  Vitamin D deficiency -     VITAMIN D 25 Hydroxy (Vit-D Deficiency, Fractures)  Iron deficiency -     Iron,Total/Total Iron Binding Cap -      Ferritin -     Vitamin B12  Vitamin D deficiency -     VITAMIN D 25 Hydroxy (Vit-D Deficiency, Fractures)  Liver disease -     MR Abdomen W Wo Contrast; Future- need repeat from last -     Hepatitis C antibody  Screening for HIV without presence of risk factors -     HIV Antibody (routine testing w rflx)  Pain in both lower extremities ? FM, ? pseudoclaudication -     DG Lumbar Spine Complete; Future -     HLA-B27 antigen -     RNP Antibody -     Rheumatoid factor    Discussed med's effects and SE's. Screening labs and tests as requested with regular follow-up as recommended. Future Appointments  Date Time [place] 05/11/2020  8:00 PM [facility] None  05/12/2020  7:00 AM [facility] None    HPI  39y.o. female  presents for a complete physical.  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   Her blood pressure has been controlled at home, today their BP is BP: 124/82.  She does workout. She denies chest pain, shortness of breath, dizziness.  BMI is Body mass index is 34.61 kg/m., she is working on diet and exercise. She has just started gluten diet, has not helped with energy yet but she is losing weight. .  Wt Readings from Last 3 Encounters:  04/15/20 189 lb 3.2 oz (85.8 kg)  04/01/20 191 lb (86.6 kg)  03/26/20 199 lb (90.3 kg)   She has fibromyalgia (reports diagnosed several years ago ~8 years), has seen rheumatology Dr. Estanislado Pandy remotely.    She has hx of OSA on sleep apnea, had extensive problems with hypersomnia, following with neuro. Has possible narcolepsy, going to have official test and will have to come off her psych meds, she has been reducing her meds for 3 weeks. She has been having chest tightness, some SOB with exertion, no CP, no palpitations. She never got the CXR that was put in.   Has depression/anxiety managed by Dr. Toy Care.  STATE THAT HER BODY HAS  BEEN ACHING WITH ANYTHING, MAINLY HER LEGS AND ARMS X 1 MONTH. Neg ANA, AntiDNA, no tick exposure. Normal PTH. Normal aldolase, CPK,   She has been having left side "stich" when she reaches around, she has leg cramps and foot cramps during intercourse. Magnesium has helped some.   Lab Results  Component Value Date   CALCIUM 10.1 01/29/2020   She has GI complaints, following with Dr. Hassell Done at [facility].  Had colonoscopy 11/22/2017, due 11/2022.  She had MRCP 12/2017 showes small posterolateral segment left liver lesion seen on previous CT measures 16 mm. Suggested MRI follow up 3-6 months, had [place] AB 03/2020 and needs repeat MRI.   She is on cholesterol medication and denies myalgias. Her cholesterol is at goal. The cholesterol last visit was:  Lab Results  Component Value Date   CHOL 167 11/05/2019   HDL 47 (L) 11/05/2019   LDLCALC 90 11/05/2019   LDLDIRECT 114.9 04/30/2012   TRIG 209 (H) 11/05/2019   CHOLHDL 3.6 11/05/2019  . She has been working on diet and exercise for prediabetes, she is not on bASA, she is not on ACE/ARB and denies foot ulcerations, hyperglycemia, hypoglycemia , increased appetite, nausea, paresthesia of the feet, polydipsia, polyuria, visual disturbances, vomiting and weight loss. Last A1C in the office was:  Lab Results  Component Value Date   HGBA1C 6.3 (H) 11/05/2019    Patient is on Vitamin D supplement.   Lab Results  Component Value Date   VD25OH 48 10/21/2019     She is on thyroid medication. Her medication was not changed last visit.  She is on 1 pill daily of 164m but 1/2 Tues, Thursday. Lab Results  Component Value Date   TSH 1.16 01/29/2020  .   Current Medications:  Current Outpatient Medications on File Prior to Visit  Medication Sig  . ALPRAZolam (XANAX) 0.5 MG tablet TAKE 1 TABLET BY MOUTH AT BEDTIME AS NEEDED.  .Marland Kitchenamphetamine-dextroamphetamine ([name]) 20 MG 24 hr capsule Take 1 capsule (20 mg total) by mouth daily.  . ARIPiprazole  ([name]) 10 MG tablet Take one half tablet by mouth daily, 5 mg daily  . Cholecalciferol (VITAMIN D3) 125 MCG (5000 UT) CAPS Take 1 capsule by mouth daily.  . Ferrous Sulfate 140 (45 Fe) MG TBCR Take 1 tablet by mouth daily.  .Marland KitchenL-Methylfolate-Algae (DEPLIN 15 PO) Take by mouth.  . levothyroxine (SYNTHROID) 150 MCG tablet TAKE 1 TABLET BY MOUTH EVERY DAY BEFORE BREAKFAST  . pantoprazole (PROTONIX) 40 MG tablet TAKE 1 TABLET BY MOUTH EVERY DAY  . rosuvastatin (CRESTOR) 5 MG tablet Take 1 tablet (5 mg total) by mouth at bedtime.  . vitamin E 100 UNIT capsule Take by mouth daily.  .Marland Kitchenvortioxetine HBr ([name]) 20 MG TABS tablet Take 20 mg by mouth  daily.   No current facility-administered medications on file prior to visit.    Health Maintenance:   Immunization History  Administered Date(s) Administered  . Influenza Inj Mdck Quad Pf 09/11/2018  . Influenza Split 09/09/2015  . Influenza,inj,Quad PF,6+ Mos 09/11/2018, 07/25/2019  . Influenza,inj,quad, With Preservative 09/19/2016  . Influenza-Unspecified 08/05/2013  . PFIZER SARS-COV-2 Vaccination 01/17/2020, 02/07/2020  . PPD Test 06/04/2018  . Td 11/21/1997  . Tdap 01/20/2011   Health Maintenance  Topic Date Due  . HIV Screening  Never done  . INFLUENZA VACCINE  06/21/2020  . PAP SMEAR-Modifier  12/28/2020  . TETANUS/TDAP  01/19/2021  . COLONOSCOPY  11/22/2022  . COVID-19 Vaccine  Completed   Tetanus: 2012 Flu vaccine:2020 Pap: 2019 neg HPV Dr. Hassell Done every 3 years MGM 03/2020 CT AB 05/2017 MRI AB 09/2018 NEEDS REPEAT Colonoscopy: 2019 Dr. Cindee Salt  Patient Care Team: Unk Pinto, MD as PCP - General (Internal Medicine) Macarthur Critchley, Brenas as Referring Physician (Optometry) Janyth Contes, MD as Consulting Physician (Obstetrics and Gynecology) Charlotte Crumb, MD as Consulting Physician (Orthopedic Surgery) Mariella Saa, MD (Neurology) Rolm Bookbinder, MD as Consulting Physician (Dermatology) Bo Merino, MD as Consulting Physician (Rheumatology) Kathlee Nations, MD as Consulting Physician (Psychiatry) Garald Balding, MD as Consulting Physician (Orthopedic Surgery)   Medical History:  Past Medical History:  Diagnosis Date  . Anemia   . Anxiety   . Constipation   . Depression   . Fibromyalgia   . GERD (gastroesophageal reflux disease)   . Hemorrhoids   . HSV-1 infection   . Hypertension   . Hypothyroidism   . OSA on CPAP   . Other abnormal glucose   . Preeclampsia    Allergies Allergies  Allergen Reactions  . Lamictal [Lamotrigine] Rash  . Doxycycline Other (See Comments)    GI UPSET  . Effexor [Venlafaxine] Other (See Comments)    DYSPHORIA  . Savella [Milnacipran Hcl] Other (See Comments)    NO RELIEF  . Prilosec [Omeprazole] Itching    SURGICAL HISTORY She  has a past surgical history that includes Cholecystectomy (2006); Tonsillectomy; Wisdom tooth extraction; and Carpal tunnel release (Right, 02/2020). FAMILY HISTORY Her family history includes Alcohol abuse in her brother; Bipolar disorder in her brother and mother; Cancer in her maternal grandmother; Cervical cancer in her mother; Colon polyps in her mother; Diabetes in her cousin, maternal aunt, maternal grandmother, and mother; Heart disease in her maternal aunt and maternal grandmother; Hyperlipidemia in her maternal grandmother; Hypertension in her maternal grandmother; Hypothyroidism in her daughter, maternal grandmother, and mother; Kidney disease in her brother; Lung cancer in her maternal grandfather. SOCIAL HISTORY She  reports that she has never smoked. She has never used smokeless tobacco. She reports current alcohol use. She reports that she does not use drugs.  ETOH 3 x a year max  Review of Systems: Review of Systems  Constitutional: Negative for chills, fever and malaise/fatigue.  Respiratory: Negative for cough, shortness of breath and wheezing.   Cardiovascular: Negative for chest pain,  palpitations and leg swelling.  Gastrointestinal: Negative for abdominal pain, blood in stool, constipation, diarrhea, heartburn and melena.  Genitourinary: Negative.   Musculoskeletal: Positive for myalgias.  Skin: Negative.   Neurological: Negative for dizziness, tingling, tremors, sensory change, speech change, focal weakness, seizures, loss of consciousness and headaches.  Psychiatric/Behavioral: Positive for depression. The patient is nervous/anxious. The patient does not have insomnia.     Physical Exam: Estimated body mass index is 34.61 kg/m as calculated from  the following:   Height as of this encounter: _0  (1.575 m).   Weight as of this encounter: 189 lb 3.2 oz (85.8 kg). BP 124/82   Pulse 84   Temp 98.1 F (36.7 C)   Ht _1  (1.575 m)   Wt 189 lb 3.2 oz (85.8 kg)   LMP 04/08/2020   SpO2 100%   BMI 34.61 kg/m   General Appearance: Well nourished well developed, in no apparent distress.  Eyes: PERRLA, EOMs, conjunctiva no swelling or erythema ENT/Mouth: Ear canals normal without obstruction, swelling, erythema, or discharge.  TMs normal bilaterally with no erythema, bulging, retraction, or loss of landmark.  Oropharynx moist and clear with no exudate, erythema, or swelling. Neck: Supple, thyroid normal. No bruits.  No cervical adenopathy Respiratory: Respiratory effort normal, Breath sounds clear A&P without wheeze, rhonchi, rales.   Cardio: RRR without murmurs, rubs or gallops. Brisk peripheral pulses without edema.  Chest: symmetric, with normal excursions Abdomen: Soft, nontender, no guarding, rebound, hernias, masses, or organomegaly.  Lymphatics: Non tender without lymphadenopathy.   Musculoskeletal: Full ROM all peripheral extremities,5/5 strength, and normal gait.  Skin: Warm, dry without rashes, lesions, ecchymosis. Neuro: Awake and oriented X 3, Cranial nerves intact, reflexes equal bilaterally. Normal muscle tone, no cerebellar symptoms. Sensation intact.   Psych:  Flat and depressed affect, Insight and Judgment appropriate.   EKG: WNL no changes.  Over 40 minutes of exam, counseling, chart review and critical decision making was performed  Vicie Mutters 3:15 PM [facility]

## 2020-04-15 ENCOUNTER — Ambulatory Visit (INDEPENDENT_AMBULATORY_CARE_PROVIDER_SITE_OTHER): Payer: BC Managed Care – PPO | Admitting: Physician Assistant

## 2020-04-15 ENCOUNTER — Encounter: Payer: Self-pay | Admitting: Physician Assistant

## 2020-04-15 ENCOUNTER — Ambulatory Visit
Admission: RE | Admit: 2020-04-15 | Discharge: 2020-04-15 | Disposition: A | Discharge status: Home / Self Care | Payer: BC Managed Care – PPO | Source: Ambulatory Visit | Attending: Physician Assistant | Admitting: Physician Assistant

## 2020-04-15 ENCOUNTER — Ambulatory Visit
Admission: RE | Admit: 2020-04-15 | Discharge: 2020-04-15 | Disposition: A | Discharge status: Home / Self Care | Payer: Self-pay | Source: Ambulatory Visit | Attending: Physician Assistant | Admitting: Physician Assistant

## 2020-04-15 ENCOUNTER — Other Ambulatory Visit: Payer: Self-pay

## 2020-04-15 VITALS — BP 124/82 | HR 84 | Temp 98.1°F | Ht 62.0 in | Wt 189.2 lb

## 2020-04-15 DIAGNOSIS — G8220 Paraplegia, unspecified: Secondary | ICD-10-CM

## 2020-04-15 DIAGNOSIS — Z114 Encounter for screening for human immunodeficiency virus [HIV]: Secondary | ICD-10-CM | POA: Diagnosis not present

## 2020-04-15 DIAGNOSIS — Z131 Encounter for screening for diabetes mellitus: Secondary | ICD-10-CM | POA: Diagnosis not present

## 2020-04-15 DIAGNOSIS — R160 Hepatomegaly, not elsewhere classified: Secondary | ICD-10-CM

## 2020-04-15 DIAGNOSIS — R7309 Other abnormal glucose: Secondary | ICD-10-CM

## 2020-04-15 DIAGNOSIS — Z130 Encounter for screening for diseases of the blood and blood-forming organs and certain disorders involving the immune mechanism: Secondary | ICD-10-CM

## 2020-04-15 DIAGNOSIS — I10 Essential (primary) hypertension: Secondary | ICD-10-CM | POA: Diagnosis not present

## 2020-04-15 DIAGNOSIS — Z9989 Dependence on other enabling machines and devices: Secondary | ICD-10-CM

## 2020-04-15 DIAGNOSIS — Z79899 Other long term (current) drug therapy: Secondary | ICD-10-CM | POA: Diagnosis not present

## 2020-04-15 DIAGNOSIS — Z0000 Encounter for general adult medical examination without abnormal findings: Secondary | ICD-10-CM

## 2020-04-15 DIAGNOSIS — Z1329 Encounter for screening for other suspected endocrine disorder: Secondary | ICD-10-CM | POA: Diagnosis not present

## 2020-04-15 DIAGNOSIS — M79604 Pain in right leg: Secondary | ICD-10-CM

## 2020-04-15 DIAGNOSIS — Z0001 Encounter for general adult medical examination with abnormal findings: Secondary | ICD-10-CM

## 2020-04-15 DIAGNOSIS — F3341 Major depressive disorder, recurrent, in partial remission: Secondary | ICD-10-CM

## 2020-04-15 DIAGNOSIS — Z136 Encounter for screening for cardiovascular disorders: Secondary | ICD-10-CM

## 2020-04-15 DIAGNOSIS — E039 Hypothyroidism, unspecified: Secondary | ICD-10-CM

## 2020-04-15 DIAGNOSIS — G4482 Headache associated with sexual activity: Secondary | ICD-10-CM

## 2020-04-15 DIAGNOSIS — M79605 Pain in left leg: Secondary | ICD-10-CM

## 2020-04-15 DIAGNOSIS — Z13220 Encounter for screening for lipoid disorders: Secondary | ICD-10-CM

## 2020-04-15 DIAGNOSIS — E611 Iron deficiency: Secondary | ICD-10-CM

## 2020-04-15 DIAGNOSIS — K769 Liver disease, unspecified: Secondary | ICD-10-CM

## 2020-04-15 DIAGNOSIS — F331 Major depressive disorder, recurrent, moderate: Secondary | ICD-10-CM

## 2020-04-15 DIAGNOSIS — R0609 Other forms of dyspnea: Secondary | ICD-10-CM

## 2020-04-15 DIAGNOSIS — E785 Hyperlipidemia, unspecified: Secondary | ICD-10-CM

## 2020-04-15 DIAGNOSIS — Z1389 Encounter for screening for other disorder: Secondary | ICD-10-CM

## 2020-04-15 DIAGNOSIS — E559 Vitamin D deficiency, unspecified: Secondary | ICD-10-CM | POA: Diagnosis not present

## 2020-04-15 DIAGNOSIS — G4733 Obstructive sleep apnea (adult) (pediatric): Secondary | ICD-10-CM

## 2020-04-15 DIAGNOSIS — M797 Fibromyalgia: Secondary | ICD-10-CM

## 2020-04-15 NOTE — Patient Instructions (Addendum)
INFORMATION ABOUT YOUR XRAY Lumbar and chest [facility] Can walk into [address] building for an Personal assistant. They will have the order and take you back. You do not any paper work, I should get the result back today or tomorrow. This order is good for a year.  Can call [phone] to schedule an appointment if you wish.   Know what a healthy weight is for you (roughly BMI <25) and aim to maintain this  Aim for 7+ servings of fruits and vegetables daily  65-80+ fluid ounces of water or unsweet tea for healthy kidneys  Limit to max 1 drink of alcohol per day; avoid smoking/tobacco  Limit animal fats in diet for cholesterol and heart health - choose grass fed whenever available  Avoid highly processed foods, and foods high in saturated/trans fats  Aim for low stress - take time to unwind and care for your mental health  Aim for 150 min of moderate intensity exercise weekly for heart health, and weights twice weekly for bone health  Aim for 7-9 hours of sleep daily

## 2020-04-16 ENCOUNTER — Other Ambulatory Visit: Payer: Self-pay | Admitting: Physician Assistant

## 2020-04-16 DIAGNOSIS — R0609 Other forms of dyspnea: Secondary | ICD-10-CM

## 2020-04-16 LAB — LIPID PANEL
Cholesterol: 139 mg/dL (ref <200)
HDL: 49 mg/dL — ABNORMAL LOW (ref >=50)
LDL Cholesterol (Calc): 68 mg/dL (calc)
Non-HDL Cholesterol (Calc): 90 mg/dL (calc) (ref <130)
Total CHOL/HDL Ratio: 2.8 (calc) (ref <5.0)
Triglycerides: 138 mg/dL (ref <150)

## 2020-04-16 LAB — MICROALBUMIN / CREATININE URINE RATIO
Creatinine, Urine: 144 mg/dL (ref 20–275)
Microalb Creat Ratio: 6 mcg/mg creat (ref <30)
Microalb, Ur: 0.8 mg/dL

## 2020-04-16 LAB — COMPLETE METABOLIC PANEL WITH GFR
AG Ratio: 2 (calc) (ref 1.0–2.5)
ALT: 18 U/L (ref 6–29)
AST: 15 U/L (ref 10–30)
Albumin: 4.7 g/dL (ref 3.6–5.1)
Alkaline phosphatase (APISO): 59 U/L (ref 31–125)
BUN: 11 mg/dL (ref 7–25)
CO2: 28 mmol/L (ref 20–32)
Calcium: 9.2 mg/dL (ref 8.6–10.2)
Chloride: 105 mmol/L (ref 98–110)
Creat: 0.8 mg/dL (ref 0.50–1.10)
GFR, Est African American: 106 mL/min/{1.73_m2} (ref >=60)
GFR, Est Non African American: 92 mL/min/{1.73_m2} (ref >=60)
Globulin: 2.4 g/dL (calc) (ref 1.9–3.7)
Glucose, Bld: 86 mg/dL (ref 65–99)
Potassium: 4.3 mmol/L (ref 3.5–5.3)
Sodium: 139 mmol/L (ref 135–146)
Total Bilirubin: 0.5 mg/dL (ref 0.2–1.2)
Total Protein: 7.1 g/dL (ref 6.1–8.1)

## 2020-04-16 LAB — HIV ANTIBODY (ROUTINE TESTING W REFLEX): HIV 1&2 Ab, 4th Generation: NONREACTIVE

## 2020-04-16 LAB — URINALYSIS, ROUTINE W REFLEX MICROSCOPIC
Bilirubin Urine: NEGATIVE
Glucose, UA: NEGATIVE
Hgb urine dipstick: NEGATIVE
Ketones, ur: NEGATIVE
Leukocytes,Ua: NEGATIVE
Nitrite: NEGATIVE
Protein, ur: NEGATIVE
Specific Gravity, Urine: 1.021 (ref 1.001–1.03)
pH: 7.5 (ref 5.0–8.0)

## 2020-04-16 LAB — CELIAC DISEASE COMPREHENSIVE PANEL WITH REFLEXES
(tTG) Ab, IgA: <1 U/mL
Immunoglobulin A: 91 mg/dL (ref 47–310)

## 2020-04-16 LAB — HEMOGLOBIN A1C
Hgb A1c MFr Bld: 5.5 % of total Hgb (ref <5.7)
Mean Plasma Glucose: 111 (calc)
eAG (mmol/L): 6.2 (calc)

## 2020-04-16 LAB — CBC WITH DIFFERENTIAL/PLATELET
Absolute Monocytes: 476 cells/uL (ref 200–950)
Basophils Absolute: 7 cells/uL (ref 0–200)
Basophils Relative: 0.1 %
Eosinophils Absolute: 42 cells/uL (ref 15–500)
Eosinophils Relative: 0.6 %
HCT: 40.4 % (ref 35.0–45.0)
Hemoglobin: 13.3 g/dL (ref 11.7–15.5)
Lymphs Abs: 2415 cells/uL (ref 850–3900)
MCH: 28.9 pg (ref 27.0–33.0)
MCHC: 32.9 g/dL (ref 32.0–36.0)
MCV: 87.6 fL (ref 80.0–100.0)
MPV: 12.1 fL (ref 7.5–12.5)
Monocytes Relative: 6.8 %
Neutro Abs: 4060 cells/uL (ref 1500–7800)
Neutrophils Relative %: 58 %
Platelets: 180 10*3/uL (ref 140–400)
RBC: 4.61 10*6/uL (ref 3.80–5.10)
RDW: 12.6 % (ref 11.0–15.0)
Total Lymphocyte: 34.5 %
WBC: 7 10*3/uL (ref 3.8–10.8)

## 2020-04-16 LAB — IRON, TOTAL/TOTAL IRON BINDING CAP
%SAT: 13 % (calc) — ABNORMAL LOW (ref 16–45)
Iron: 47 ug/dL (ref 40–190)
TIBC: 373 mcg/dL (calc) (ref 250–450)

## 2020-04-16 LAB — HEPATITIS C ANTIBODY
Hepatitis C Ab: NONREACTIVE
SIGNAL TO CUT-OFF: 0.01 (ref <1.00)

## 2020-04-16 LAB — HLA-B27 ANTIGEN: HLA-B27 Antigen: NEGATIVE

## 2020-04-16 LAB — INSULIN, RANDOM: Insulin: 11.4 u[IU]/mL

## 2020-04-16 LAB — VITAMIN B12: Vitamin B-12: 582 pg/mL (ref 200–1100)

## 2020-04-16 LAB — FERRITIN: Ferritin: 29 ng/mL (ref 16–232)

## 2020-04-16 LAB — MAGNESIUM: Magnesium: 2.4 mg/dL (ref 1.5–2.5)

## 2020-04-16 LAB — TSH: TSH: 0.58 mIU/L

## 2020-04-16 LAB — VITAMIN D 25 HYDROXY (VIT D DEFICIENCY, FRACTURES): Vit D, 25-Hydroxy: 72 ng/mL (ref 30–100)

## 2020-04-16 LAB — RHEUMATOID FACTOR: Rheumatoid fact SerPl-aCnc: <14 IU/mL (ref <14)

## 2020-04-16 LAB — RNP ANTIBODY: Ribonucleic Protein(ENA) Antibody, IgG: <1 AI

## 2020-04-16 LAB — T4, FREE: Free T4: 1.6 ng/dL (ref 0.8–1.8)

## 2020-04-21 ENCOUNTER — Other Ambulatory Visit: Payer: Self-pay

## 2020-04-21 ENCOUNTER — Ambulatory Visit (INDEPENDENT_AMBULATORY_CARE_PROVIDER_SITE_OTHER): Payer: No Typology Code available for payment source | Admitting: Orthopaedic Surgery

## 2020-04-21 ENCOUNTER — Encounter: Payer: Self-pay | Admitting: Orthopaedic Surgery

## 2020-04-21 DIAGNOSIS — M25531 Pain in right wrist: Secondary | ICD-10-CM | POA: Diagnosis not present

## 2020-04-21 NOTE — Progress Notes (Signed)
Office Visit Note   Patient: Lauren [name]           Date of Birth: Oct 09, 1981           MRN: [id number] Visit Date: 04/21/2020              Requested by: Unk Pinto, [address] [place] [address] [place],  [place] [address] PCP: Unk Pinto, MD   Assessment & Plan: Visit Diagnoses:  1. Pain in right wrist     Plan: Acute injury to right wrist sustained while on the job.  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  She was seen in the emergency room with negative films On 04/10/2020.  She has been wearing a volar wrist splint and actually feeling "better".  She is also several months status post right carpal tunnel release and not having any recurrence of her preoperative symptoms.  I suspect she has had a soft tissue injury to her wrist and as she is feeling better like her to wear the volar wrist splint for an additional 2 weeks.  We will give her a note for work regarding limitation of activities.  Consider MRI scan of the wrist if no improvement over the next several weeks Follow-Up Instructions: Return in about 2 weeks (around 05/05/2020).   Orders:  No orders of the defined types were placed in this encounter.  No orders of the defined types were placed in this encounter.     Procedures: No procedures performed   Clinical Data: No additional findings.   Subjective: Chief Complaint  Patient presents with  . Right Wrist - Pain  Patient presents today for her right wrist. She injured her wrist on 04-08-2020. A student was holding her wrist and twirling around her. She states that she felt a pop. Her pain is located all around her wrist. Picking things up does not seem to bother her wrist, but pushing down does hurt. She has been wearing her wrist brace and states that helps. She went to [facility] and had x-rays taken on the day she felt the pop. She initially had swelling in her palm, but now feels like it  may be only swollen posteriorly. She is taking Ibuprofen if needed. She had carpal tunnel release on 02-27-2020 on the right side. She is right hand dominant.  A referral note from [place]. clinic is included and evaluated  HPI  Review of Systems  Constitutional: Positive for fatigue.  HENT: Negative for ear pain.   Eyes: Negative for pain.  Respiratory: Negative for shortness of breath.   Cardiovascular: Negative for leg swelling.  Gastrointestinal: Positive for constipation. Negative for diarrhea.  Endocrine: Negative for cold intolerance.  Genitourinary: Negative for difficulty urinating.  Musculoskeletal: Positive for joint swelling.  Skin: Negative for rash.  Allergic/Immunologic: Negative for food allergies.  Neurological: Negative for weakness.  Hematological: Does not bruise/bleed easily.  Psychiatric/Behavioral: Positive for sleep disturbance.     Objective: Vital Signs: Ht 5\' 2"  (1.575 m)   Wt 189 lb (85.7 kg)   LMP 04/08/2020   BMI 34.57 kg/m   Physical Exam Constitutional:      Appearance: She is well-developed.  Eyes:     Pupils: Pupils are equal, round, and reactive to light.  Pulmonary:     Effort: Pulmonary effort is normal.  Skin:    General: Skin is warm and dry.  Neurological:     Mental Status:  She is alert and oriented to person, place, and time.  Psychiatric:        Behavior: Behavior normal.     Ortho Exam right hand and wrist with well-healed carpal tunnel scar.  No numbness or tingling in the radial 3 digits.  Occasionally has some tingling in the ulnar 2 digits but none today.  Good capillary refill.  No swelling of her fingers.  Some discomfort with motion of the wrist more dorsally than volarly.  Little bit of tenderness over the ulnar collateral ligament.  No skin changes.  No ecchymosis or erythema.  No popping or clicking.  No loss of wrist motion.  Does have some incomplete supination of the forearm that she has had for "a  long time  Specialty Comments:  No specialty comments available.  Imaging: No results found.   PMFS History: Patient Active Problem List   Diagnosis Date Noted  . Pain in right wrist 04/21/2020  . Trigger thumb, left thumb 01/28/2020  . Menorrhagia with irregular cycle 01/22/2020  . Urine pregnancy test negative 01/22/2020  . Uterine enlargement 01/22/2020  . Major depressive disorder, recurrent episode, moderate (HCC) 12/03/2019  . Carpal tunnel syndrome, bilateral 06/21/2019  . 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 Liver lesion, left lobe 10/04/2018  . Fibromyalgia 06/04/2018  . Obstructive sleep apnea treated with continuous positive airway pressure (CPAP) 05/23/2018  . 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 Hypersomnia with sleep apnea 01/16/2018  . Hepatomegaly 12/21/2017  . Hepatic steatosis 12/21/2017  . Headache associated with sexual activity 03/08/2017  . Dizziness 03/08/2017  . Medication management 09/09/2015  . Abnormal glucose 06/02/2015  . Hyperlipidemia LDL goal <100 06/02/2015  . Morbid obesity (HCC) 06/02/2015  . Depression   . Hypertension   . Hypothyroidism 04/30/2012  . 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 Paraparesis (HCC) 02/02/2012   Past Medical History:  Diagnosis Date  . Anemia   . Anxiety   . Constipation   . Depression   . Fibromyalgia   . GERD (gastroesophageal reflux disease)   . Hemorrhoids   . HSV-1 infection   . Hypertension   . Hypothyroidism   . OSA on CPAP   . Other abnormal glucose   . Preeclampsia     Family History  Problem Relation Age of Onset  . Hypothyroidism Mother   . Cervical cancer Mother   . Bipolar disorder Mother   . Diabetes Mother   . Colon polyps Mother   . Bipolar disorder Brother   . Kidney disease Brother   . Alcohol abuse Brother   . Hypothyroidism Daughter   . Heart disease Maternal Grandmother   . Diabetes Maternal Grandmother   . Cancer Maternal Grandmother        SKIN  .  Hyperlipidemia Maternal Grandmother   . Hypertension Maternal Grandmother   . Hypothyroidism Maternal Grandmother   . Lung cancer Maternal Grandfather   . Heart disease Maternal Aunt   . Diabetes Maternal Aunt   . Diabetes Cousin   . Colon cancer Neg Hx     Past Surgical History:  Procedure Laterality Date  . CARPAL TUNNEL RELEASE Right 02/2020  . CHOLECYSTECTOMY  2006  . TONSILLECTOMY    . WISDOM TOOTH EXTRACTION     Social History   Occupational History  . Occupation: Magazine features editor: [employer]  Tobacco Use  . Smoking status: Never Smoker  . Smokeless tobacco: Never Used  Substance and Sexual Activity  . Alcohol use: Yes  Alcohol/week: 0.0 standard drinks    Comment: occ  . Drug use: No  . Sexual activity: Yes    Partners: Male    Birth control/protection: None, Surgical    Comment: husband has had a vasectomy

## 2020-05-02 ENCOUNTER — Encounter: Payer: Self-pay | Admitting: Neurology

## 2020-05-04 NOTE — Telephone Encounter (Signed)
The following sleep aids can hopefully help without alteration of sleep architecture -   1)Melatonin, just take it up to the day of the study. 2) Unisom, has to be stopped 2 days prior.   Also:  no benzodiazepines, Ambien or Lunesta.

## 2020-05-11 ENCOUNTER — Ambulatory Visit (INDEPENDENT_AMBULATORY_CARE_PROVIDER_SITE_OTHER): Payer: BC Managed Care – PPO | Admitting: Neurology

## 2020-05-11 DIAGNOSIS — G4733 Obstructive sleep apnea (adult) (pediatric): Secondary | ICD-10-CM | POA: Diagnosis not present

## 2020-05-11 DIAGNOSIS — G4710 Hypersomnia, unspecified: Secondary | ICD-10-CM

## 2020-05-11 DIAGNOSIS — G47419 Narcolepsy without cataplexy: Secondary | ICD-10-CM

## 2020-05-12 ENCOUNTER — Other Ambulatory Visit: Payer: Self-pay

## 2020-05-12 ENCOUNTER — Ambulatory Visit (INDEPENDENT_AMBULATORY_CARE_PROVIDER_SITE_OTHER): Payer: BC Managed Care – PPO | Admitting: Neurology

## 2020-05-12 ENCOUNTER — Other Ambulatory Visit: Payer: Self-pay | Admitting: Neurology

## 2020-05-12 DIAGNOSIS — G4710 Hypersomnia, unspecified: Secondary | ICD-10-CM

## 2020-05-12 DIAGNOSIS — G47419 Narcolepsy without cataplexy: Secondary | ICD-10-CM

## 2020-05-12 DIAGNOSIS — G47421 Narcolepsy in conditions classified elsewhere with cataplexy: Secondary | ICD-10-CM

## 2020-05-12 NOTE — Addendum Note (Signed)
Addended by: Tamera Stands D on: 05/12/2020 04:54 PM   Modules accepted: Orders

## 2020-05-13 ENCOUNTER — Other Ambulatory Visit: Payer: Self-pay

## 2020-05-13 ENCOUNTER — Encounter: Payer: Self-pay | Admitting: Orthopaedic Surgery

## 2020-05-13 ENCOUNTER — Ambulatory Visit: Payer: BC Managed Care – PPO | Admitting: Orthopaedic Surgery

## 2020-05-13 ENCOUNTER — Ambulatory Visit (INDEPENDENT_AMBULATORY_CARE_PROVIDER_SITE_OTHER): Payer: No Typology Code available for payment source | Admitting: Orthopaedic Surgery

## 2020-05-13 VITALS — Ht 62.0 in | Wt 189.0 lb

## 2020-05-13 DIAGNOSIS — M25531 Pain in right wrist: Secondary | ICD-10-CM

## 2020-05-13 NOTE — Progress Notes (Signed)
Office Visit Note   Patient: Lauren [name]           Date of Birth: 04-06-1981           MRN: [id number] Visit Date: 05/13/2020              Requested by: Unk Pinto, [address] [place],  [place] [address] PCP: Unk Pinto, MD   Assessment & Plan: Visit Diagnoses:  1. 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  She is now wearing a volar wrist splint and relates that she is "much better".  She actually has removed the splint on occasion without any significant pain.  She also was recently status post right carpal tunnel release and it does not appear to have had any injury affecting that surgery.  May discontinue the splint as she is doing so well and return to see me in 2 weeks.  Hopefully at that time she really reached max medical improvement.  We will give her a note saying she can work full duty without restriction  Follow-Up Instructions: Return in about 2 weeks (around 05/27/2020).   Orders:  No orders of the defined types were placed in this encounter.  No orders of the defined types were placed in this encounter.     Procedures: No procedures performed   Clinical Data: No additional findings.   Subjective: Chief Complaint  Patient presents with  . 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  She had a violent twisting of her wrist with some local discomfort.  X-rays were negative.  She was placed in a volar wrist splint and relates that she is feeling much better.  Has not had any problems in reference to her right carpal tunnel release.  Still has some triggering of the right thumb but the wrist is is feeling "fine".  HPI  Review of Systems   Objective: Vital Signs: Ht 5\' 2"  (1.575 m)   Wt 189 lb (85.7 kg)   BMI 34.57 kg/m   Physical Exam Constitutional:       Appearance: She is well-developed.  Eyes:     Pupils: Pupils are equal, round, and reactive to light.  Pulmonary:     Effort: Pulmonary effort is normal.  Skin:    General: Skin is warm and dry.  Neurological:     Mental Status: She is alert and oriented to person, place, and time.  Psychiatric:        Behavior: Behavior normal.     Ortho Exam right hand without any local tenderness in the dorsum of the wrist.  No pain with either radial or ulnar deviation.  Negative Finkelstein's test.  Skin was without ecchymosis.  No erythema.  Neurologically intact.  Well-healed incision from her recent carpal tunnel surgery.  Good sensibility to the tips of the fingers and excellent capillary refill.  No tenderness over the distal radius or ulna.  Normal range of motion of her wrist in flexion and extension  Specialty Comments:  No specialty comments available.  Imaging: No results found.   PMFS History: Patient Active Problem List   Diagnosis Date Noted  . Pain in right wrist 04/21/2020  . Trigger thumb, left thumb 01/28/2020  . Menorrhagia with irregular cycle 01/22/2020  . Urine pregnancy test negative 01/22/2020  . Uterine enlargement  01/22/2020  . Major depressive disorder, recurrent episode, moderate (HCC) 12/03/2019  . Carpal tunnel syndrome, bilateral 06/21/2019  . 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 Liver lesion, left lobe 10/04/2018  . Fibromyalgia 06/04/2018  . Obstructive sleep apnea treated with continuous positive airway pressure (CPAP) 05/23/2018  . 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 Hypersomnia with sleep apnea 01/16/2018  . Hepatomegaly 12/21/2017  . Hepatic steatosis 12/21/2017  . Headache associated with sexual activity 03/08/2017  . Dizziness 03/08/2017  . Medication management 09/09/2015  . Abnormal glucose 06/02/2015  . Hyperlipidemia LDL goal <100 06/02/2015  . Morbid obesity (HCC) 06/02/2015  . Depression   . Hypertension   . Hypothyroidism 04/30/2012    . 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 Paraparesis (HCC) 02/02/2012   Past Medical History:  Diagnosis Date  . Anemia   . Anxiety   . Constipation   . Depression   . Fibromyalgia   . GERD (gastroesophageal reflux disease)   . Hemorrhoids   . HSV-1 infection   . Hypertension   . Hypothyroidism   . OSA on CPAP   . Other abnormal glucose   . Preeclampsia     Family History  Problem Relation Age of Onset  . Hypothyroidism Mother   . Cervical cancer Mother   . Bipolar disorder Mother   . Diabetes Mother   . Colon polyps Mother   . Bipolar disorder Brother   . Kidney disease Brother   . Alcohol abuse Brother   . Hypothyroidism Daughter   . Heart disease Maternal Grandmother   . Diabetes Maternal Grandmother   . Cancer Maternal Grandmother        SKIN  . Hyperlipidemia Maternal Grandmother   . Hypertension Maternal Grandmother   . Hypothyroidism Maternal Grandmother   . Lung cancer Maternal Grandfather   . Heart disease Maternal Aunt   . Diabetes Maternal Aunt   . Diabetes Cousin   . Colon cancer Neg Hx     Past Surgical History:  Procedure Laterality Date  . CARPAL TUNNEL RELEASE Right 02/2020  . CHOLECYSTECTOMY  2006  . TONSILLECTOMY    . WISDOM TOOTH EXTRACTION     Social History   Occupational History  . Occupation: Magazine features editor: [employer]  Tobacco Use  . Smoking status: Never Smoker  . Smokeless tobacco: Never Used  Vaping Use  . Vaping Use: Never used  Substance and Sexual Activity  . Alcohol use: Yes    Alcohol/week: 0.0 standard drinks    Comment: occ  . Drug use: No  . Sexual activity: Yes    Partners: Male    Birth control/protection: None, Surgical    Comment: husband has had a vasectomy     Valeria Batman, MD   Note - This record has been created using [employer].  Chart creation errors have been sought, but may not always  have been located. Such creation errors do not reflect on  the standard of  medical care.

## 2020-05-15 ENCOUNTER — Other Ambulatory Visit: Payer: Self-pay | Admitting: Adult Health

## 2020-05-15 DIAGNOSIS — K2100 Gastro-esophageal reflux disease with esophagitis, without bleeding: Secondary | ICD-10-CM

## 2020-05-15 DIAGNOSIS — R110 Nausea: Secondary | ICD-10-CM

## 2020-05-15 LAB — COMPREHENSIVE DRUG ANALYSIS,UR

## 2020-05-16 ENCOUNTER — Ambulatory Visit
Admission: RE | Admit: 2020-05-16 | Discharge: 2020-05-16 | Disposition: A | Discharge status: Home / Self Care | Payer: BC Managed Care – PPO | Source: Ambulatory Visit | Attending: Physician Assistant | Admitting: Physician Assistant

## 2020-05-16 ENCOUNTER — Other Ambulatory Visit: Payer: Self-pay

## 2020-05-16 DIAGNOSIS — K769 Liver disease, unspecified: Secondary | ICD-10-CM

## 2020-05-16 MED ORDER — GADOBENATE DIMEGLUMINE 529 MG/ML IV SOLN
17.0000 mL | Freq: Once | INTRAVENOUS | Status: AC | PRN
  Administered 2020-05-16: 17 mL via INTRAVENOUS

## 2020-05-21 ENCOUNTER — Telehealth: Payer: Self-pay | Admitting: Neurology

## 2020-05-21 ENCOUNTER — Encounter: Payer: Self-pay | Admitting: Neurology

## 2020-05-21 DIAGNOSIS — G4710 Hypersomnia, unspecified: Secondary | ICD-10-CM | POA: Insufficient documentation

## 2020-05-21 DIAGNOSIS — G4733 Obstructive sleep apnea (adult) (pediatric): Secondary | ICD-10-CM | POA: Insufficient documentation

## 2020-05-21 NOTE — Progress Notes (Signed)
Valid PSG on CPAP for MSLT to follow, REM sleep latency was still 60 minutes.    CC ; to Dr Janace Hoard, MD please

## 2020-05-21 NOTE — Procedures (Signed)
PATIENT'S NAME:  Lauren, [name] DOB:      02/22/1981      MR#:    [id number]     DATE OF RECORDING: 05/11/2020 REFERRING M.D.:  Lauren Pinto, MD, Lauren May, MD  Study Performed:   CPAP baseline study for MSLT to follow HISTORY:  Lauren [name] is returning for baseline study with CPAP prior to MSLT study. 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   The patient endorsed the Epworth Sleepiness Scale at 14 points.    The patient's weight 192 pounds with a height of 61 (inches), resulting in a BMI of 36.2 kg/m2.  The patient's neck circumference measured 15.5 inches.  CURRENT MEDICATIONS:  Advil, Pravachol, Synthroid    PROCEDURE:  This is a multichannel digital polysomnogram utilizing the SomnoStar 11.2 system.  Electrodes and sensors were applied and monitored per AASM Specifications.   EEG, EOG, Chin and Limb EMG, were sampled at 200 Hz.  ECG, Snore and Nasal Pressure, Thermal Airflow, Respiratory Effort, CPAP Flow and Pressure, Oximetry was sampled at 50 Hz. Digital video and audio were recorded.       CPAP was initiated under her mask type from home- at 7 cmH20 with heated humidity per AASM split night standards and pressure was advanced to 8 cmH20 because of snoring.  At a PAP pressure of 8 cmH20, there was a reduction of the AHI to 0.1 with improvement of sleep apnea.  Lights Out was at 20:52 and Lights On at 05:09. Total recording time (TRT) was 497.5 minutes, with a total sleep time (TST) of 447 minutes. The patient's sleep latency was 27.5 minutes. REM latency was 60 minutes.  The sleep efficiency was 89.8 %.    SLEEP ARCHITECTURE: WASO (Wake after sleep onset) was 23 minutes.  There were 21 minutes in Stage N1, 117 minutes Stage N2, 163 minutes  Stage N3 and 146 minutes in Stage REM.  The percentage of Stage N1 was 4.7%, Stage N2 was 26.2%, Stage N3 was 36.5% and Stage R (REM sleep) was 32.7%.  RESPIRATORY ANALYSIS:  There was a total of 1 respiratory event: 1 hypopnea.     The total APNEA/HYPOPNEA INDEX (AHI) was 0.1 /hour. The REM AHI was 0 /hour versus a non-REM AHI of .2 /hour.  The patient spent 173.5 minutes of total sleep time in the supine position and 274 minutes in non-supine. The supine AHI was 0.0, versus a non-supine AHI of 0.2.  OXYGEN SATURATION & C02:  The baseline 02 saturation was 98%, with the lowest being 93%. Time spent below 89% saturation equaled 0 minutes.  The arousals were noted as: 51 were spontaneous, 0 were associated with PLMs, and 0 were associated with respiratory events. The patient had a total of 0 Periodic Limb Movements. The Periodic Limb Movement (PLM) index was 0 and the PLM Arousal index was 0 /hour.   Audio and video analysis did not show any abnormal or unusual movements, behaviors, phonations or vocalizations.   EKG was in keeping with normal sinus rhythm (NSR).   DIAGNOSIS 1. Primary Snoring alleviated under 8 cm CPAP, no residual AHI noted.  2. Valid study for MSLT to continue. REM latency was 60 minutes.   A follow up appointment will be scheduled in the Sleep Clinic at [facility].   Please call [phone] with any questions.      I certify that I have reviewed the entire raw data recording prior to the issuance of this report in accordance with the Standards of Accreditation of the American Academy of Sleep Medicine (AASM)   Lauren [name], M.D. Diplomat, Tax adviser of Psychiatry and Neurology  Diplomat, Tax adviser of Sleep Medicine Market researcher, [employer] [facility] [employer]

## 2020-05-21 NOTE — Telephone Encounter (Signed)
Pt has called to report she has seen the results to her sleep study, pt is asking for a call for them to be discussed so she can get on medication.

## 2020-05-24 ENCOUNTER — Other Ambulatory Visit: Payer: Self-pay | Admitting: Neurology

## 2020-05-24 DIAGNOSIS — F331 Major depressive disorder, recurrent, moderate: Secondary | ICD-10-CM

## 2020-05-27 ENCOUNTER — Telehealth: Payer: Self-pay | Admitting: Neurology

## 2020-05-27 ENCOUNTER — Other Ambulatory Visit: Payer: Self-pay | Admitting: Neurology

## 2020-05-27 DIAGNOSIS — F331 Major depressive disorder, recurrent, moderate: Secondary | ICD-10-CM

## 2020-05-27 MED ORDER — ARIPIPRAZOLE 10 MG PO TABS: ORAL_TABLET | ORAL | 2 refills | Status: DC

## 2020-05-27 NOTE — Procedures (Signed)
  Name:  Lauren [name], Lauren [name] Reference [id number]  Study Date: 05/12/2020 Procedure #: [id number]  DOB: 1981-11-04    Protocol  This is a 13 channel Multiple Sleep Latency Test comprised of 5 channels of EEG (T3-Cz, Cz-T4, F4-M1, C4-M1, O2-M1), 3 channels of Chin EMG, 4 channels of EOG and 1 channel for ECG.   All channels were sampled at _0 .    This polysomnographic procedure is designed to evaluate (1) the complaint of excessive daytime sleepiness by quantifying the time required to fall asleep and (2) the possibility of narcolepsy by checking for abnormally short latencies to REM sleep.  Electrographic variables include EEG, EMG, EOG and ECG.  Patients are monitored throughout four or five 20-minute opportunities to sleep (naps) at two-hour intervals.  For each nap, the patient is allowed 20 minutes to fall asleep.  Once asleep, the patient is awakened after 15 minutes.  Between naps, the patient is kept as alert as possible.  A sleep latency of 20 minutes indicates that no sleep occurred.  Parametric Analysis  Total Number of Naps 5     NAP # Time of Nap  Sleep Latency (mins) REM Latency (mins) Sleep Time Percent Awake Time Percent  1 06:56 12.5 0 44 56   2 08:57 3 0 67  33   3 10:57 7.5 0 58  42   4 12:56 7.5 0 60  40   5 14:46 9.5 0 37  63    MSLT Summary of Naps  Sleepiness Index: 60  Mean Sleep Latency to all Five Naps: 8  Mean Sleep Latency to First Four Naps: 7.6  Mean Sleep Latency to First Three Naps: 7.7  Mean Sleep Latency to First Two Naps: 7.8  Number of Naps with REM Sleep: 0    Results from Preceding PSG Study  Sleep Onset Time 21:40 Sleep Efficiency (%) 89.8  Rise Time 05:09 Sleep Latency (min) 27.5  Total Sleep Time  447 min REM Latency (min) 60    I attest to having reviewed every epoch of the entire raw data recording prior to the issuance of this report in accordance with the Standards of the American Academy of Sleep Medicine.      Name:  [name], [name] Reference #:  [id number]  Study Date: 05/12/2020 DOB: Jun 11, 1981   IMPRESSION:  This multiple sleep latency test reveals a mean sleep latency of 8.0 minutes with 0 sleep periods during which REM sleep was recorded.  A total of 5 sleep periods were recorded.   REM sleep latency was 60 minutes in preceding PSG.  This study was preceded by an overnight polysomnogram with a total sleep time (TST) of 447 minutes.       RECOMMENDATIONS: This MSLT study is abnormal. 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 This study, in the proper clinical scenario, may still be consistent with a diagnosis of narcolepsy. Please note the positive HLA test.    Larey Seat, M.D. Diplomat, Tax adviser of Psychiatry and Neurology  Diplomat, Tax adviser of Sleep Medicine Market researcher, [employer] [facility] [employer]

## 2020-05-27 NOTE — Telephone Encounter (Signed)
Called patient to discuss sleep study results. No answer at this time. LVM for the patient to call back.  Will send a mychart message as well. 

## 2020-05-27 NOTE — Telephone Encounter (Signed)
-----   Message from Melvyn Novas, MD sent at 05/27/2020 10:08 AM EDT ----- Still no REM sleep onset in all naps, but sleep onset time averages less than 10 minutes- a documented high level of sleepiness.  I will treat as narcolepsy within the formulary her insurance will allow [place].

## 2020-05-27 NOTE — Progress Notes (Signed)
Still no REM sleep onset in all naps, but sleep onset time averages less than 10 minutes- a documented high level of sleepiness.  I will treat as narcolepsy within the formulary her insurance will allow [place].

## 2020-05-28 MED ORDER — SUNOSI 150 MG PO TABS: 150.0000 mg | ORAL_TABLET | ORAL | 0 refills | Status: DC

## 2020-05-28 MED ORDER — SUNOSI 75 MG PO TABS: 75.0000 mg | ORAL_TABLET | ORAL | 0 refills | Status: DC

## 2020-05-28 NOTE — Addendum Note (Signed)
Addended by: Judi Cong on: 05/28/2020 10:01 AM   Modules accepted: Orders

## 2020-06-03 ENCOUNTER — Other Ambulatory Visit: Payer: Self-pay | Admitting: Neurology

## 2020-06-03 ENCOUNTER — Telehealth: Payer: Self-pay | Admitting: Neurology

## 2020-06-03 MED ORDER — SUNOSI 150 MG PO TABS: 150.0000 mg | ORAL_TABLET | ORAL | 0 refills | Status: DC

## 2020-06-03 NOTE — Addendum Note (Signed)
Addended by: Judi Cong on: 06/03/2020 07:51 AM   Modules accepted: Orders

## 2020-06-03 NOTE — Telephone Encounter (Signed)
PA submitted for the patient through cover my meds/[facility] [employer] KEY: [id number] Should hear a response within 24 hrs.

## 2020-06-03 NOTE — Telephone Encounter (Signed)
Yes please send me a efill request with the right dosage she wants thanks

## 2020-06-03 NOTE — Telephone Encounter (Signed)
Script sent for the patient  

## 2020-06-04 ENCOUNTER — Encounter: Payer: Self-pay | Admitting: Orthopaedic Surgery

## 2020-06-04 ENCOUNTER — Ambulatory Visit (INDEPENDENT_AMBULATORY_CARE_PROVIDER_SITE_OTHER): Payer: No Typology Code available for payment source | Admitting: Orthopaedic Surgery

## 2020-06-04 ENCOUNTER — Other Ambulatory Visit: Payer: Self-pay

## 2020-06-04 VITALS — Ht 62.0 in | Wt 189.0 lb

## 2020-06-04 DIAGNOSIS — M25531 Pain in right wrist: Secondary | ICD-10-CM | POA: Diagnosis not present

## 2020-06-04 NOTE — Telephone Encounter (Signed)
PA approved for the medication for the pt through [employer]. Approved until 06/03/2021.

## 2020-06-04 NOTE — Progress Notes (Signed)
Office Visit Note   Patient: Lauren [name]           Date of Birth: 07/17/1981           MRN: [id number] Visit Date: 06/04/2020              Requested by: Lucky Cowboy, MD [address] [place],  [place] [address] PCP: Lucky Cowboy, MD   Assessment & Plan: Visit Diagnoses:  1. Pain in right wrist     Plan:  #1: At this time her symptoms are very minimal.  She still has a little bit of some discomfort in the volar aspect of the wrist.  She is very happy with her prescription.  Symptoms at this time.  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  If she has recurrence of her symptoms or worsening of her symptoms she will return at that time otherwise follow-up as needed.  Follow-Up Instructions: No follow-ups on file.   Orders:  No orders of the defined types were placed in this encounter.  No orders of the defined types were placed in this encounter.     Procedures: No procedures performed   Clinical Data: No additional findings.   Subjective: Chief Complaint  Patient presents with  . Right Wrist - Follow-up   HPI Patient presents today for follow up on her right wrist. She said that she is doing much better. No complaints. She needs a new work note today releasing her back to full duty.   Review of Systems  Constitutional: Positive for fatigue.  HENT: Negative for ear pain.   Eyes: Negative for pain.  Respiratory: Negative for shortness of breath.   Cardiovascular: Negative for leg swelling.  Gastrointestinal: Positive for constipation. Negative for diarrhea.  Endocrine: Negative for cold intolerance.  Genitourinary: Negative for difficulty urinating.  Musculoskeletal: Positive for joint swelling.  Skin: Negative for rash.  Allergic/Immunologic: Negative for food allergies.  Neurological: Negative for weakness.  Hematological: Does  not bruise/bleed easily.  Psychiatric/Behavioral: Positive for sleep disturbance.     Objective: Vital Signs: Ht 5\' 2"  (1.575 m)   Wt 189 lb (85.7 kg)   BMI 34.57 kg/m   Physical Exam Constitutional:      Appearance: Normal appearance. She is well-developed.  HENT:     Head: Normocephalic.  Eyes:     Pupils: Pupils are equal, round, and reactive to light.  Pulmonary:     Effort: Pulmonary effort is normal.  Skin:    General: Skin is warm and dry.  Neurological:     Mental Status: She is oriented to person, place, and time.  Psychiatric:        Mood and Affect: Mood normal.        Behavior: Behavior normal.        Thought Content: Thought content normal.        Judgment: Judgment normal.     Ortho Exam  Examination today reveals minimal amount of pain to palpation around the wrist.  She has 60degrees volar and dorsiflexion.  She supinates to almost 80 degrees and pronates to 70 degrees.  Neurovascular intact distally.    Specialty Comments:  No specialty comments available.  Imaging: No results found.   PMFS History: Current Outpatient Medications  Medication Sig Dispense Refill  . ALPRAZolam (XANAX) 0.5 MG tablet TAKE 1 TABLET BY MOUTH AT  BEDTIME AS NEEDED. 30 tablet 0  . amphetamine-dextroamphetamine ([name]) 20 MG 24 hr capsule Take 1 capsule (20 mg total) by mouth daily. 30 capsule 0  . ARIPiprazole ([name]) 10 MG tablet Take one half tablet by mouth daily, 5 mg daily 30 tablet 2  . Cholecalciferol (VITAMIN D3) 125 MCG (5000 UT) CAPS Take 1 capsule by mouth daily.    . Ferrous Sulfate 140 (45 Fe) MG TBCR Take 1 tablet by mouth daily.    Marland Kitchen L-Methylfolate-Algae (DEPLIN 15 PO) Take by mouth.    . levothyroxine (SYNTHROID) 150 MCG tablet TAKE 1 TABLET BY MOUTH EVERY DAY BEFORE BREAKFAST 90 tablet 1  . pantoprazole (PROTONIX) 40 MG tablet TAKE 1 TABLET BY MOUTH EVERY DAY 30 tablet 1  . rosuvastatin (CRESTOR) 5 MG tablet Take 1 tablet (5 mg total) by mouth at  bedtime. 90 tablet 3  . Solriamfetol HCl ([name]) 150 MG TABS Take 150 mg by mouth every morning. 30 tablet 0  . vitamin E 100 UNIT capsule Take by mouth daily.    Marland Kitchen vortioxetine HBr ([name]) 20 MG TABS tablet Take 20 mg by mouth daily.     No current facility-administered medications for this visit.    Patient Active Problem List   Diagnosis Date Noted  . Mild obstructive sleep apnea 05/21/2020  . Hypersomnia, persistent 05/21/2020  . Pain in right wrist 04/21/2020  . Trigger thumb, left thumb 01/28/2020  . Menorrhagia with irregular cycle 01/22/2020  . Urine pregnancy test negative 01/22/2020  . Uterine enlargement 01/22/2020  . Major depressive disorder, recurrent episode, moderate (HCC) 12/03/2019  . Carpal tunnel syndrome, bilateral 06/21/2019  . 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 Liver lesion, left lobe 10/04/2018  . Fibromyalgia 06/04/2018  . Obstructive sleep apnea treated with continuous positive airway pressure (CPAP) 05/23/2018  . 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 Hypersomnia with sleep apnea 01/16/2018  . Hepatomegaly 12/21/2017  . Hepatic steatosis 12/21/2017  . Headache associated with sexual activity 03/08/2017  . Dizziness 03/08/2017  . Medication management 09/09/2015  . Abnormal glucose 06/02/2015  . Hyperlipidemia LDL goal <100 06/02/2015  . Morbid obesity (HCC) 06/02/2015  . Depression   . Hypertension   . Hypothyroidism 04/30/2012  . 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 Paraparesis (HCC) 02/02/2012   Past Medical History:  Diagnosis Date  . Anemia   . Anxiety   . Constipation   . Depression   . Fibromyalgia   . GERD (gastroesophageal reflux disease)   . Hemorrhoids   . HSV-1 infection   . Hypertension   . Hypothyroidism   . OSA on CPAP   . Other abnormal glucose   . Preeclampsia     Family History  Problem Relation Age of Onset  . Hypothyroidism Mother   . Cervical cancer Mother   . Bipolar disorder Mother   . Diabetes  Mother   . Colon polyps Mother   . Bipolar disorder Brother   . Kidney disease Brother   . Alcohol abuse Brother   . Hypothyroidism Daughter   . Heart disease Maternal Grandmother   . Diabetes Maternal Grandmother   . Cancer Maternal Grandmother        SKIN  . Hyperlipidemia Maternal Grandmother   . Hypertension Maternal Grandmother   . Hypothyroidism Maternal Grandmother   . Lung cancer Maternal Grandfather   . Heart disease Maternal Aunt   . Diabetes Maternal Aunt   . Diabetes Cousin   . Colon cancer Neg Hx  Past Surgical History:  Procedure Laterality Date  . CARPAL TUNNEL RELEASE Right 02/2020  . CHOLECYSTECTOMY  2006  . TONSILLECTOMY    . WISDOM TOOTH EXTRACTION     Social History   Occupational History  . Occupation: Magazine features editor: [employer]  Tobacco Use  . Smoking status: Never Smoker  . Smokeless tobacco: Never Used  Vaping Use  . Vaping Use: Never used  Substance and Sexual Activity  . Alcohol use: Yes    Alcohol/week: 0.0 standard drinks    Comment: occ  . Drug use: No  . Sexual activity: Yes    Partners: Male    Birth control/protection: None, Surgical    Comment: husband has had a vasectomy

## 2020-06-08 NOTE — Progress Notes (Signed)
Subjective:    Patient ID: Lauren [name], female    DOB: 1981-07-27, 39 y.o.   MRN: [id number]  HPI 39 y.o. WF presents with cough x 4-5 days ago, started with hoarseness but then processed to sore throat, coughing and yesterday started to have productive thick, green mucus. Low grade temp. 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   No SOB, no wheezing, no CP, no palpitations, no leg swelling.   Blood pressure 128/84, pulse 65, temperature (!) 97.3 F (36.3 C), weight 189 lb (85.7 kg), SpO2 100 %.  Medications  Current Outpatient Medications (Endocrine & Metabolic):    levothyroxine (SYNTHROID) 150 MCG tablet, TAKE 1 TABLET BY MOUTH EVERY DAY BEFORE BREAKFAST  Current Outpatient Medications (Cardiovascular):    rosuvastatin (CRESTOR) 5 MG tablet, Take 1 tablet (5 mg total) by mouth at bedtime.    Current Outpatient Medications (Hematological):    Ferrous Sulfate 140 (45 Fe) MG TBCR, Take 1 tablet by mouth daily.  Current Outpatient Medications (Other):    ALPRAZolam (XANAX) 0.5 MG tablet, TAKE 1 TABLET BY MOUTH AT BEDTIME AS NEEDED.   ARIPiprazole ([name]) 10 MG tablet, Take one half tablet by mouth daily, 5 mg daily   Cholecalciferol (VITAMIN D3) 125 MCG (5000 UT) CAPS, Take 1 capsule by mouth daily.   L-Methylfolate-Algae (DEPLIN 15 PO), Take by mouth.   pantoprazole (PROTONIX) 40 MG tablet, TAKE 1 TABLET BY MOUTH EVERY DAY   Solriamfetol HCl ([name]) 150 MG TABS, Take 150 mg by mouth every morning.   vitamin E 100 UNIT capsule, Take by mouth daily.   vortioxetine HBr ([name]) 20 MG TABS tablet, Take 20 mg by mouth daily.  Problem list She has Hypothyroidism; Paralysis, periodic secondary; Depression; Hypertension; Abnormal glucose; Hyperlipidemia LDL goal <100; Morbid obesity (HCC); Medication management; Paraparesis (HCC); Headache associated with sexual activity; Dizziness; Hepatomegaly; Hepatic  steatosis; Recurrent isolated sleep paralysis; Hypersomnia with sleep apnea; Obstructive sleep apnea treated with continuous positive airway pressure (CPAP); Fibromyalgia; Liver lesion, left lobe; Narcolepsy; Carpal tunnel syndrome, bilateral; Major depressive disorder, recurrent episode, moderate (HCC); Menorrhagia with irregular cycle; Urine pregnancy test negative; Uterine enlargement; Trigger thumb, left thumb; Pain in right wrist; Mild obstructive sleep apnea; and Hypersomnia, persistent on their problem list.   Review of Systems  Constitutional: Positive for fatigue. Negative for chills and fever.  HENT: Positive for congestion, rhinorrhea and sore throat. Negative for dental problem, ear discharge, ear pain, nosebleeds, trouble swallowing and voice change.   Respiratory: Positive for cough. Negative for chest tightness, shortness of breath and wheezing.   Cardiovascular: Negative.   Gastrointestinal: Negative.   Genitourinary: Negative.   Musculoskeletal: Negative.   Neurological: Negative.        Objective:   Physical Exam Constitutional:      Appearance: She is well-developed.  HENT:     Head: Normocephalic and atraumatic.     Right Ear: External ear normal.     Nose:     Right Sinus: Maxillary sinus tenderness present. No frontal sinus tenderness.     Left Sinus: Maxillary sinus tenderness present. No frontal sinus tenderness.  Eyes:     Conjunctiva/sclera: Conjunctivae normal.  Cardiovascular:     Rate and Rhythm: Normal rate and regular rhythm.     Heart sounds: Normal heart sounds.  Pulmonary:     Effort: Pulmonary effort is normal. No respiratory distress.     Breath sounds: Normal breath sounds. No wheezing.  Abdominal:     General: Bowel sounds are normal.     Palpations: Abdomen is soft.  Musculoskeletal:     Cervical back: Normal range of motion and neck supple.  Lymphadenopathy:     Cervical: No cervical adenopathy.  Skin:    General: Skin is warm and dry.            Assessment & Plan:  Lauren [name] was seen today for acute visit, hoarse, cough and sore throat.  Diagnoses and all orders for this visit:  Cough  Other orders -     dexamethasone (DECADRON) 0.5 MG tablet; take 1 tablet PO BID for 3 days, then take 1 tablet PO for 4 days. -     benzonatate (TESSALON) 200 MG capsule; Take 1 capsule (200 mg total) by mouth 3 (three) times daily as needed for cough (Max: 600mg  per day).   Discussed the importance of avoiding unnecessary antibiotic therapy. Suggested symptomatic OTC remedies. Nasal saline spray for congestion. Nasal steroids, allergy pill, oral steroids sent in If not better or symptoms worsening let know.  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   Follow up as needed.

## 2020-06-09 ENCOUNTER — Encounter: Payer: Self-pay | Admitting: Physician Assistant

## 2020-06-09 ENCOUNTER — Other Ambulatory Visit: Payer: Self-pay

## 2020-06-09 ENCOUNTER — Ambulatory Visit (INDEPENDENT_AMBULATORY_CARE_PROVIDER_SITE_OTHER): Payer: BC Managed Care – PPO | Admitting: Physician Assistant

## 2020-06-09 VITALS — BP 128/84 | HR 65 | Temp 97.3°F | Wt 189.0 lb

## 2020-06-09 DIAGNOSIS — R059 Cough, unspecified: Secondary | ICD-10-CM

## 2020-06-09 DIAGNOSIS — R05 Cough: Secondary | ICD-10-CM | POA: Diagnosis not present

## 2020-06-09 MED ORDER — DEXAMETHASONE 0.5 MG PO TABS: ORAL_TABLET | ORAL | 0 refills | Status: DC

## 2020-06-09 MED ORDER — BENZONATATE 200 MG PO CAPS: 200.0000 mg | ORAL_CAPSULE | Freq: Three times a day (TID) | ORAL | 0 refills | Status: DC | PRN

## 2020-06-09 NOTE — Patient Instructions (Addendum)
Can schedule [facility] test [facility]  HOW TO TREAT VIRAL COUGH AND COLD SYMPTOMS:  -Symptoms usually last at least 1 week with the worst symptoms being around day 4.  - colds usually start with a sore throat and end with a cough, and the cough can take 2 weeks to get better.  -No antibiotics are needed for colds, flu, sore throats, cough, bronchitis UNLESS symptoms are longer than 7 days OR if you are getting better then get drastically worse.  -There are a lot of combination medications (Dayquil, Nyquil, Vicks 44, tyelnol cold and sinus, ETC). Please look at the ingredients on the back so that you are treating the correct symptoms and not doubling up on medications/ingredients.     Here are things you can do to help with this: - Try the Flonase or Nasonex. Remember to spray each nostril twice towards the outer part of your eye.  Do not sniff but instead pinch your nose and tilt your head back to help the medicine get into your sinuses.  The best time to do this is at bedtime.Stop if you get blurred vision or nose bleeds.   -Please pick one of the over the counter allergy medications below and take it once daily for allergies.  It will also help with fluid behind ear drums. Claritin or loratadine cheapest but likely the weakest  Zyrtec or certizine at night because it can make you sleepy The strongest is allegra or fexafinadine  Cheapest at [facility], [facility], [facility]  -can use decongestant over the counter, please do not use if you have high blood pressure or certain heart conditions.   if worsening HA, changes vision/speech, imbalance, weakness go to the ER  Make sure you are on an allergy pill, see below for more details. Please take the prednisone as directed below, this is NOT an antibiotic so you do NOT have to finish it. You can take it for a few days and stop it if you are doing better.   Please take the prednisone to help decrease inflammation and therefore decrease symptoms. Take it  it with food to avoid GI upset. It can cause increased energy but on the other hand it can make it hard to sleep at night so please take it AT NIGHT WITH DINNER, it takes 8-12 hours to start working so it will NOT affect your sleeping if you take it at night with your food!!  If you are diabetic it will increase your sugars so decrease carbs and monitor your sugars closely.     Medicines you can use  Nasal congestion  Little Remedies saline spray (aerosol/mist)- can try this, it is in the kids section - pseudoephedrine (Sudafed)- behind the counter, do not use if you have high blood pressure, medicine that have -D in them.  - phenylephrine (Sudafed PE) -Dextormethorphan + chlorpheniramine (Coridcidin HBP)- okay if you have high blood pressure -Oxymetazoline (Afrin) nasal spray- LIMIT to 3 days -Saline nasal spray -Neti pot (used distilled or bottled water)  Ear pain/congestion  -pseudoephedrine (sudafed) - Nasonex/flonase nasal spray  Fever  -Acetaminophen (Tyelnol) -Ibuprofen (Advil, motrin, aleve)  Sore Throat  -Acetaminophen (Tyelnol) -Ibuprofen (Advil, motrin, aleve) -Drink a lot of water -Gargle with salt water - Rest your voice (don't talk) -Throat sprays -Cough drops  Body Aches  -Acetaminophen (Tyelnol) -Ibuprofen (Advil, motrin, aleve)  Headache  -Acetaminophen (Tyelnol) -Ibuprofen (Advil, motrin, aleve) - Exedrin, Exedrin Migraine  Allergy symptoms (cough, sneeze, runny nose, itchy eyes) -Claritin or loratadine cheapest but  likely the weakest  -Zyrtec or certizine at night because it can make you sleepy -The strongest is allegra or fexafinadine  Cheapest at [facility], [facility], [facility]  Cough  -Dextromethorphan (Delsym)- medicine that has DM in it -Guafenesin (Mucinex/Robitussin) - cough drops - drink lots of water  Chest Congestion  -Guafenesin (Mucinex/Robitussin)  Red Itchy Eyes  - Naphcon-A  Upset Stomach  - Bland diet (nothing spicy, greasy, fried,  and high acid foods like tomatoes, oranges, berries) -OKAY- cereal, bread, soup, crackers, rice -Eat smaller more frequent meals -reduce caffeine, no alcohol -Loperamide (Imodium-AD) if diarrhea -Prevacid for heart burn  General health when sick  -Hydration -wash your hands frequently -keep surfaces clean -change pillow cases and sheets often -Get fresh air but do not exercise strenuously -Vitamin D, double up on it - Vitamin C -Zinc

## 2020-06-26 ENCOUNTER — Ambulatory Visit (INDEPENDENT_AMBULATORY_CARE_PROVIDER_SITE_OTHER): Payer: BC Managed Care – PPO | Admitting: Adult Health

## 2020-06-26 ENCOUNTER — Other Ambulatory Visit: Payer: Self-pay

## 2020-06-26 ENCOUNTER — Encounter: Payer: Self-pay | Admitting: Adult Health

## 2020-06-26 VITALS — BP 120/80 | HR 86 | Temp 97.7°F | Wt 192.0 lb

## 2020-06-26 DIAGNOSIS — M25512 Pain in left shoulder: Secondary | ICD-10-CM

## 2020-06-26 MED ORDER — CYCLOBENZAPRINE HCL 5 MG PO TABS
5.0000 mg | ORAL_TABLET | Freq: Three times a day (TID) | ORAL | 0 refills | Status: DC | PRN
Start: 2020-06-26 — End: 2020-08-12

## 2020-06-26 MED ORDER — METHYLPREDNISOLONE 4 MG PO TBPK: ORAL_TABLET | ORAL | 0 refills | Status: DC

## 2020-06-26 NOTE — Patient Instructions (Signed)
Shoulder Impingement Syndrome Rehab Ask your health care provider which exercises are safe for you. Do exercises exactly as told by your health care provider and adjust them as directed. It is normal to feel mild stretching, pulling, tightness, or discomfort as you do these exercises. Stop right away if you feel sudden pain or your pain gets worse. Do not begin these exercises until told by your health care provider. Stretching and range-of-motion exercise This exercise warms up your muscles and joints and improves the movement and flexibility of your shoulder. This exercise also helps to relieve pain and stiffness. Passive horizontal adduction In passive adduction, you use your other hand to move the injured arm toward your body. The injured arm does not move on its own. In this movement, your arm is moved across your body in the horizontal plane (horizontal adduction). 1. Sit or stand and pull your left / right elbow across your chest, toward your other shoulder. Stop when you feel a gentle stretch in the back of your shoulder and upper arm. ? Keep your arm at shoulder height. ? Keep your arm as close to your body as you comfortably can. 2. Hold for __________ seconds. 3. Slowly return to the starting position. Repeat __________ times. Complete this exercise __________ times a day. Strengthening exercises These exercises build strength and endurance in your shoulder. Endurance is the ability to use your muscles for a long time, even after they get tired. External rotation, isometric This is an exercise in which you press the back of your wrist against a door frame without moving your shoulder joint (isometric). 1. Stand or sit in a doorway, facing the door frame. 2. Bend your left / right elbow and place the back of your wrist against the door frame. Only the back of your wrist should be touching the frame. Keep your upper arm at your side. 3. Gently press your wrist against the door  frame, as if you are trying to push your arm away from your abdomen (external rotation). Press as hard as you are able without pain. ? Avoid shrugging your shoulder while you press your wrist against the door frame. Keep your shoulder blade tucked down toward the middle of your back. 4. Hold for __________ seconds. 5. Slowly release the tension, and relax your muscles completely before you repeat the exercise. Repeat __________ times. Complete this exercise __________ times a day. Internal rotation, isometric This is an exercise in which you press your palm against a door frame without moving your shoulder joint (isometric). 1. Stand or sit in a doorway, facing the door frame. 2. Bend your left / right elbow and place the palm of your hand against the door frame. Only your palm should be touching the frame. Keep your upper arm at your side. 3. Gently press your hand against the door frame, as if you are trying to push your arm toward your abdomen (internal rotation). Press as hard as you are able without pain. ? Avoid shrugging your shoulder while you press your hand against the door frame. Keep your shoulder blade tucked down toward the middle of your back. 4. Hold for __________ seconds. 5. Slowly release the tension, and relax your muscles completely before you repeat the exercise. Repeat __________ times. Complete this exercise __________ times a day. Scapular protraction, supine  1. Lie on your back on a firm surface (supine position). Hold a __________ weight in your left / right hand. 2. Raise your left /  right arm straight into the air so your hand is directly above your shoulder joint. 3. Push the weight into the air so your shoulder (scapula) lifts off the surface that you are lying on. The scapula will push up or forward (protraction). Do not move your head, neck, or back. 4. Hold for __________ seconds. 5. Slowly return to the starting position. Let your muscles relax completely  before you repeat this exercise. Repeat __________ times. Complete this exercise __________ times a day. Scapular retraction  1. Sit in a stable chair without armrests, or stand up. 2. Secure an exercise band to a stable object in front of you so the band is at shoulder height. 3. Hold one end of the exercise band in each hand. Your palms should face down. 4. Squeeze your shoulder blades together (retraction) and move your elbows slightly behind you. Do not shrug your shoulders upward while you do this. 5. Hold for __________ seconds. 6. Slowly return to the starting position. Repeat __________ times. Complete this exercise __________ times a day. Shoulder extension  1. Sit in a stable chair without armrests, or stand up. 2. Secure an exercise band to a stable object in front of you so the band is above shoulder height. 3. Hold one end of the exercise band in each hand. 4. Straighten your elbows and lift your hands up to shoulder height. 5. Squeeze your shoulder blades together and pull your hands down to the sides of your thighs (extension). Stop when your hands are straight down by your sides. Do not let your hands go behind your body. 6. Hold for __________ seconds. 7. Slowly return to the starting position. Repeat __________ times. Complete this exercise __________ times a day. This information is not intended to replace advice given to you by your health care provider. Make sure you discuss any questions you have with your health care provider. Document Revised: 03/01/2019 Document Reviewed: 12/03/2018 Elsevier Patient Education  2020 [employer]      Cyclobenzaprine tablets What is this medicine? CYCLOBENZAPRINE (sye kloe BEN za preen) is a muscle relaxer. It is used to treat muscle pain, spasms, and stiffness. This medicine may be used for other purposes; ask your health care provider or pharmacist if you have questions. COMMON BRAND NAME(S): Fexmid, Flexeril What should I  tell my health care provider before I take this medicine? They need to know if you have any of these conditions:  heart disease, irregular heartbeat, or previous heart attack  liver disease  thyroid problem  an unusual or allergic reaction to cyclobenzaprine, tricyclic antidepressants, lactose, other medicines, foods, dyes, or preservatives  pregnant or trying to get pregnant  breast-feeding How should I use this medicine? Take this medicine by mouth with a glass of water. Follow the directions on the prescription label. If this medicine upsets your stomach, take it with food or milk. Take your medicine at regular intervals. Do not take it more often than directed. Talk to your pediatrician regarding the use of this medicine in children. Special care may be needed. Overdosage: If you think you have taken too much of this medicine contact a poison control center or emergency room at once. NOTE: This medicine is only for you. Do not share this medicine with others. What if I miss a dose? If you miss a dose, take it as soon as you can. If it is almost time for your next dose, take only that dose. Do not take double or extra doses. What  may interact with this medicine? Do not take this medicine with any of the following medications:  MAOIs like Carbex, Eldepryl, Marplan, Nardil, and Parnate  narcotic medicines for cough  safinamide This medicine may also interact with the following medications:  alcohol  bupropion  antihistamines for allergy, cough and cold  certain medicines for anxiety or sleep  certain medicines for bladder problems like oxybutynin, tolterodine  certain medicines for depression like amitriptyline, fluoxetine, sertraline  certain medicines for Parkinson's disease like benztropine, trihexyphenidyl  certain medicines for seizures like phenobarbital, primidone  certain medicines for stomach problems like dicyclomine, hyoscyamine  certain medicines for  travel sickness like scopolamine  general anesthetics like halothane, isoflurane, methoxyflurane, propofol  ipratropium  local anesthetics like lidocaine, pramoxine, tetracaine  medicines that relax muscles for surgery  narcotic medicines for pain  phenothiazines like chlorpromazine, mesoridazine, prochlorperazine, thioridazine  verapamil This list may not describe all possible interactions. Give your health care provider a list of all the medicines, herbs, non-prescription drugs, or dietary supplements you use. Also tell them if you smoke, drink alcohol, or use illegal drugs. Some items may interact with your medicine. What should I watch for while using this medicine? Tell your doctor or health care professional if your symptoms do not start to get better or if they get worse. You may get drowsy or dizzy. Do not drive, use machinery, or do anything that needs mental alertness until you know how this medicine affects you. Do not stand or sit up quickly, especially if you are an older patient. This reduces the risk of dizzy or fainting spells. Alcohol may interfere with the effect of this medicine. Avoid alcoholic drinks. If you are taking another medicine that also causes drowsiness, you may have more side effects. Give your health care provider a list of all medicines you use. Your doctor will tell you how much medicine to take. Do not take more medicine than directed. Call emergency for help if you have problems breathing or unusual sleepiness. Your mouth may get dry. Chewing sugarless gum or sucking hard candy, and drinking plenty of water may help. Contact your doctor if the problem does not go away or is severe. What side effects may I notice from receiving this medicine? Side effects that you should report to your doctor or health care professional as soon as possible:  allergic reactions like skin rash, itching or hives, swelling of the face, lips, or tongue  breathing  problems  chest pain  fast, irregular heartbeat  hallucinations  seizures  unusually weak or tired Side effects that usually do not require medical attention (report to your doctor or health care professional if they continue or are bothersome):  headache  nausea, vomiting This list may not describe all possible side effects. Call your doctor for medical advice about side effects. You may report side effects to FDA at 1-800-FDA-1088. Where should I keep my medicine? Keep out of the reach of children. Store at room temperature between 15 and 30 degrees C (59 and 86 degrees F). Keep container tightly closed. Throw away any unused medicine after the expiration date. NOTE: This sheet is a summary. It may not cover all possible information. If you have questions about this medicine, talk to your doctor, pharmacist, or health care provider.  2020 Elsevier/Gold Standard (2018-10-10 12:49:26)

## 2020-06-26 NOTE — Progress Notes (Signed)
Assessment and Plan:  Lauren [name] was seen today for arm pain.  Diagnoses and all orders for this visit:  Acute pain of left shoulder Possible tendonitis, impingement; vague; exam is benign Educational material distributed. Gentle ROM exercises. Rest, ice, compression, and elevation (RICE) therapy. steroid and muscle relaxer per orders  Follow up in 3-5 days with progress, if improved but residual pain after steroid transition to NSAID Any worsening, numbness/tingling/weakness follow up and will pursue imaging vs follow up with established ortho -     methylPREDNISolone (MEDROL DOSEPAK) 4 MG TBPK tablet; Take as prescribed on package. -     cyclobenzaprine (FLEXERIL) 5 MG tablet; Take 1 tablet (5 mg total) by mouth 3 (three) times daily as needed for muscle spasms.  Further disposition pending results of labs. Discussed med's effects and SE's.   Over 30 minutes of exam, counseling, chart review, and critical decision making was performed.   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   She started noting 06/18/2020 some tenderness and aching in anterior upper arm, waxing and waning, never fully resolves. She reports throbbing at baseline, then intermittently with have sharp, shooting pain, will want to hold pressure and massage her muscles. She also reports L lateral neck pain, none midline, some shooting/radiating aching down to her fingers, denies numbness/tingling, weakness. She reports was having some pain with raising her arm above her shoulder, would wake her up at night.   She reports pain ranges 2-6/10; has  tried 500 mg tylenol without benefit, 400 mg ibuprofen without benefit. Has tried heat which felt good but didn't ease pain, and massage which seemed to make it worse. Had hydrocodone from previous surgery which allowed her to sleep.   She notes 5 days prior to onset she tripped and rolled in her yard "really didn't hit anything" - no immediate pain in the following days.   Past Medical History:  Diagnosis Date  . Anemia   . Anxiety   . Constipation   . Depression   . Fibromyalgia   . GERD (gastroesophageal reflux disease)   . Hemorrhoids   . HSV-1 infection   . Hypertension   . Hypothyroidism   . OSA on CPAP   . Other abnormal glucose   . Preeclampsia      Allergies  Allergen Reactions  . Lamictal [Lamotrigine] Rash  . Doxycycline Other (See Comments)    GI UPSET  . Effexor [Venlafaxine] Other (See Comments)    DYSPHORIA  . Savella [Milnacipran Hcl] Other (See Comments)    NO RELIEF  . Prilosec [Omeprazole] Itching    Current Outpatient Medications on File Prior to Visit  Medication Sig  . ALPRAZolam (XANAX) 0.5 MG tablet TAKE 1 TABLET BY MOUTH AT BEDTIME AS NEEDED.  Marland Kitchen ARIPiprazole ([name]) 10 MG tablet Take one half tablet by mouth daily, 5 mg daily  . Cholecalciferol (VITAMIN D3) 125 MCG (5000 UT) CAPS Take 1 capsule by mouth daily.  . Ferrous Sulfate 140 (45 Fe) MG TBCR Take 1 tablet by mouth daily.  . Iron-Vitamin C 65-125 MG TABS Take by mouth daily.  Marland Kitchen L-Methylfolate-Algae (DEPLIN 15 PO) Take by  mouth.  . levothyroxine (SYNTHROID) 150 MCG tablet TAKE 1 TABLET BY MOUTH EVERY DAY BEFORE BREAKFAST (Patient taking differently: Take one tablet on Mon, Tues. Thurs, Sat and on Sunday and Wednesdays)  . pantoprazole (PROTONIX) 40 MG tablet TAKE 1 TABLET BY MOUTH EVERY DAY  . rosuvastatin (CRESTOR) 5 MG tablet Take 1 tablet (5 mg total) by mouth at bedtime.  . Solriamfetol HCl ([name]) 150 MG TABS Take 150 mg by mouth every morning.  . vortioxetine HBr ([name])  20 MG TABS tablet Take 20 mg by mouth daily.  . benzonatate (TESSALON) 200 MG capsule Take 1 capsule (200 mg total) by mouth 3 (three) times daily as needed for cough (Max: 600mg  per day).  dexamethasone (DECADRON) 0.5 MG tablet take 1 tablet PO BID for 3 days, then take 1 tablet PO for 4 days.  . vitamin E 100 UNIT capsule Take by mouth daily. Takes 180mg    No current facility-administered medications on file prior to visit.    ROS: all negative except above.   Physical Exam:  BP 120/80   Pulse 86   Temp 97.7 F (36.5 C)   Wt 192 lb (87.1 kg)   SpO2 99%   BMI 35.12 kg/m   General Appearance: Well nourished, well dressed obese female in no apparent distress. Eyes: conjunctiva no swelling or erythema ENT/Mouth: Mask in place; Hearing normal.  Neck: Supple, thyroid normal.  Respiratory: Respiratory effort normal, BS equal bilaterally without rales, rhonchi, wheezing or stridor.  Cardio: RRR with no MRGs. Brisk peripheral pulses without edema.  Abdomen: Soft, + BS.  Non tender, no guarding, rebound, hernias, masses. Lymphatics: Non tender without lymphadenopathy.  Musculoskeletal: Full ROM neck, bilateral shoulder active ROM symmetrical, 5/5 strength, normal gait. Distal sensation and perfusion intact. Spurling's negative. Some tight neck trap and lateral musculature; no mildline spine tenderness, Neg Spurling's, Neer's. Hawkin's with upper arm pain, none in shoulder joint Skin: Warm, dry without rashes, lesions, ecchymosis.  Neuro: Cranial nerves intact. Normal muscle tone, Sensation intact.  Psych: Awake and oriented X 3, normal affect, Insight and Judgment appropriate.     Marland Kitchen, NP 10:53 AM [facility]

## 2020-07-01 ENCOUNTER — Other Ambulatory Visit: Payer: Self-pay

## 2020-07-01 ENCOUNTER — Ambulatory Visit (INDEPENDENT_AMBULATORY_CARE_PROVIDER_SITE_OTHER): Payer: BC Managed Care – PPO | Admitting: Adult Health

## 2020-07-01 ENCOUNTER — Encounter: Payer: Self-pay | Admitting: Adult Health

## 2020-07-01 VITALS — BP 124/90 | HR 84 | Temp 97.2°F | Resp 16 | Wt 194.6 lb

## 2020-07-01 DIAGNOSIS — M791 Myalgia, unspecified site: Secondary | ICD-10-CM | POA: Diagnosis not present

## 2020-07-01 DIAGNOSIS — R5383 Other fatigue: Secondary | ICD-10-CM | POA: Diagnosis not present

## 2020-07-01 DIAGNOSIS — R29818 Other symptoms and signs involving the nervous system: Secondary | ICD-10-CM

## 2020-07-01 DIAGNOSIS — R509 Fever, unspecified: Secondary | ICD-10-CM | POA: Diagnosis not present

## 2020-07-01 DIAGNOSIS — M6281 Muscle weakness (generalized): Secondary | ICD-10-CM | POA: Diagnosis not present

## 2020-07-01 NOTE — Patient Instructions (Addendum)
Myasthenia Gravis Myasthenia gravis (MG) is a long-term (chronic) condition that causes weakness in the muscles you can control (voluntary muscles). MG can affect any voluntary muscle. The muscles most often affected are the ones that control:  Eye movement.  Facial movements.  Swallowing. MG is a disease in which the body's disease-fighting system (immune system) attacks its own healthy tissues (autoimmune disease). When you have MG, your immune system makes proteins (antibodies) that block the chemical (acetylcholine) that your body needs to send nerve signals to your muscles. This causes muscle weakness. What are the causes? The exact cause of MG is not known. What increases the risk? The following factors may make you more likely to develop this condition:  Having an enlarged thymus gland. The thymus gland is located under the breastbone. It makes certain cells for the immune system.  Having a family history of MG. What are the signs or symptoms? Symptoms of MG may include:  Drooping eyelids.  Double vision.  Muscle weakness that gets worse with activity and gets better after rest.  Difficulty walking.  Trouble chewing and swallowing.  Trouble making facial expressions.  Slurred speech.  Weakness of the arms, hands, and legs. Sudden, severe difficulty breathing (myasthenic crisis) may develop after having:  An infection.  A fever.  A bad reaction to a medicine. Myasthenic crisis requires emergency breathing support. Sometimes symptoms of MG go away for a while (remission) and then come back later. How is this diagnosed? This condition may be diagnosed based on:  Your symptoms and medical history.  A physical exam.  Blood tests.  Tests of your muscle strength and function.  Imaging tests, such as a CT scan or an MRI. How is this treated? The goal of treatment is to improve muscle strength. Treatment may include:  Taking medicine.  Making lifestyle  changes that focus on saving your energy.  Doing physical therapy to gain strength.  Having surgery to remove the thymus gland (thymectomy). This may result in a long remission for some people.  Having a procedure to remove the acetylcholine antibodies (plasmapheresis).  Getting emergency breathing support, if you experience myasthenic crisis. If you experience remission, you may be able to stop treatment and then resume treatment when your symptoms return. Follow these instructions at home:   Take over-the-counter and prescription medicines only as told by your health care provider.  Get plenty of rest and sleep. Take frequent breaks to rest your eyes, especially when in bright light or working on a computer.  Maintain a healthy diet and a healthy weight. Work with your health care provider or a diet and nutrition specialist (dietitian) if you need help.  Do exercises as told by your health care provider or physical therapist.  Do not use any products that contain nicotine or tobacco, such as cigarettes and e-cigarettes. If you need help quitting, ask your health care provider.  Prevent infections by: ? Washing your hands often with soap and water. If soap and water are not available, use hand sanitizer. ? Avoiding contact with other people who are sick. ? Avoiding touching your eyes, nose, and mouth. ? Cleaning surfaces in your home that are touched often using a disinfectant.  Keep all follow-up visits as told by your health care provider. This is important. Contact a health care provider if:  Your symptoms change or get worse, especially after having a fever or infection. Get help right away if:  You have trouble breathing. Summary  Myasthenia gravis (MG) is   a long-term (chronic) condition that causes weakness in the muscles you can control (voluntary muscles).  A symptom of MG is muscle weakness that gets worse with activity and gets better after rest.  Sudden, severe  difficulty breathing (myasthenic crisis) may develop after having an infection, a fever, or a bad reaction to a medicine.  The goal of treatment is to improve muscle strength. Treatment may include medicines, lifestyle changes, physical therapy, surgery, plasmapheresis, or emergency breathing support. This information is not intended to replace advice given to you by your health care provider. Make sure you discuss any questions you have with your health care provider. Document Revised: 11/20/2017 Document Reviewed: 11/20/2017 Elsevier Patient Education  2020 [employer]  

## 2020-07-01 NOTE — Progress Notes (Signed)
Assessment and Plan:  Lauren [name] was seen today for follow-up and headache.  Diagnoses and all orders for this visit:  Transient neurological symptoms Possible atypical migraine, cannot r/o TIA entirely though typically with very well controlled BP, cholesterol, non-smoker, low risk factors Current neuro exam is normal other than some isolated weakness in quads and tibialis anterior, symmetrical; denies back pain; also lower extremities with hyperreflexia, though this appears ongoing per 2013 neuro note in care everywhere  Discussed possible CT or MRI of head, though without active sx, transient, question necessity at this time. After extended discussion with patient, reassurance that neuro exam does not suggest active CVA, she is reassure and requests to defer Check ESR, CPR, will also check MG panel, ongoing intermittent muscular weakness Notably she is currently on steroid taper -  She is well established with neuro, most recently Lauren [name] for longer history of vague neuro/muscular sx with extensive workup negative other than sleep disorder and narcolepsy as per HPI. She is agreeable to follow up with Lauren [name] to discuss further if persistent sx and lab workup unremarkable.  Will go to the ER if recurrent headache, persistent changes vision/speech, imbalance, weakness.  Fatigue, unspecified type Low grade fever -     CBC with Differential/Platelet -     COMPLETE METABOLIC PANEL WITH GFR -     TSH -     Urinalysis w microscopic + reflex cultur -     C-reactive protein -     Sedimentation rate  Muscular weakness -     CK -     Aldolase -     Myasthenia gravis panel 2  Muscle tenderness -     CK -     Aldolase -     C-reactive protein -     Sedimentation rate  Further disposition pending results of labs. Discussed med's effects and SE's.   Over 30 minutes of exam, counseling, chart review, and critical decision making was performed.   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 Notably recently was started on methylprednisolone taper for L shoulder pain, possible bursitis/tendonitis (improved).   Today she reports she had 2 episodes of L sided sharp pain in the back of her head and temple, sensation of heaviness/dull in left side of face, had trouble keeping eye open, mildly blurry vision, was struggling to speak clearly. Had first episode on Sunday 06/28/2020 while she was driving, sudden onset, lasted about 3 minutes then spontaneously resolved, then went home to lay down. She reports another episode last night while eating dinner with husband, lasted similarly about 3 min then resolved.   She also reports 1 month of just feeling very tired and "not right," reports low grade fevers in the evening, low 99s, was on her way to work today but had to turn around and went home.  She denies current headache, reports just vague sense of dulled sensation in face, still feels like she can't move her face symmetrically. She reports also having sensation of weakness  in bil legs, she feels possibly L > R, perhaps started 4 days ago and seems to be getting worse.  However on further investigation, has long history of episodic muscular weakness, preceding 2013, saw neuro in [facility] Lauren [name] in 2013 for muscle weakness, per notes she had several MRI brain to r/o MS and C spine MRI which were unremarkable. She has had numerous evaluations for vague neuromuscular sx without conclusive  dx (? Presumed fibromyalgia), Has had extensive lab workup, B12, copper, burgdorfi abi, rocky mtn spotted fever (? Neg vs positive, had 0.12 and  1.58 on same day, 04/30/2012), in 2017 had negative ANA, anti- DNA. In last year had normal TSH, epstein barr, serum protein elctrophoresis, TSH, CBC, CMP, UA, B12, HIV, LHA-B27, RNP antibody, RF, comprehensive drug panel. She had negative covid 19 on 06/09/2020.   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 Sh eis prescribed solriamfetol 150 mg. Also abilify 10 mg, trintellix 20 mg daily.   She has also been reporting aching and tenderness without notable injury of left upper arm, neck for ~2 weeks.   Lab Results  Component Value Date   TSH 0.58 04/15/2020   Today their BP is BP: 124/90 She denies chest pain, shortness of breath, dizziness, palpitations, dizziness, edema.   The cholesterol last visit was:   Lab Results  Component Value Date   CHOL 139 04/15/2020   HDL 49 (L) 04/15/2020   LDLCALC 68 04/15/2020   LDLDIRECT 114.9 04/30/2012   TRIG 138 04/15/2020   CHOLHDL 2.8 04/15/2020    Last A1C in the office was:  Lab Results  Component Value Date   HGBA1C 5.5 04/15/2020    Past Medical History:  Diagnosis Date   Anemia    Anxiety    Constipation    Depression    Fibromyalgia    GERD (gastroesophageal reflux disease)    Hemorrhoids    HSV-1 infection    Hypertension    Hypothyroidism    OSA on CPAP    Other abnormal glucose    Preeclampsia     Allergies:  Allergies  Allergen Reactions   Lamictal [Lamotrigine] Rash   Doxycycline Other (See Comments)    GI UPSET   Effexor [Venlafaxine] Other (See Comments)    DYSPHORIA   Savella [Milnacipran Hcl] Other (See Comments)    NO RELIEF   Prilosec [Omeprazole] Itching   Family History:  [name] history  includes Alcohol abuse in her brother; Bipolar disorder in her brother and mother; Cancer in her maternal grandmother; Cervical cancer in her mother; Colon polyps in her mother; Diabetes in her cousin, maternal aunt, maternal grandmother, and mother; Heart disease in her maternal aunt and maternal grandmother; Hyperlipidemia in her maternal grandmother; Hypertension in her maternal grandmother; Hypothyroidism in her daughter, maternal grandmother, and mother; Kidney disease in her brother; Lung cancer in her maternal grandfather.  Social History:   reports that she has never smoked. She has never used smokeless tobacco. She reports current alcohol use. She reports that she does not use drugs.   Allergies  Allergen Reactions   Lamictal [Lamotrigine] Rash   Doxycycline Other (See Comments)    GI UPSET   Effexor [Venlafaxine] Other (See Comments)    DYSPHORIA   Savella [Milnacipran Hcl] Other (See Comments)    NO RELIEF   Prilosec [Omeprazole] Itching    Current  Outpatient Medications on File Prior to Visit  Medication Sig   ALPRAZolam (XANAX) 0.5 MG tablet TAKE 1 TABLET BY MOUTH AT BEDTIME AS NEEDED.   ARIPiprazole ([name]) 10 MG tablet Take one half tablet by mouth daily, 5 mg daily   Cholecalciferol (VITAMIN D3) 125 MCG (5000 UT) CAPS Take 1 capsule by mouth daily.   cyclobenzaprine (FLEXERIL) 5 MG tablet Take 1 tablet (5 mg total) by mouth 3 (three) times daily as needed for muscle spasms.   Iron-Vitamin C 65-125 MG TABS Take by mouth daily.   L-Methylfolate-Algae (DEPLIN 15 PO) Take by mouth.   levothyroxine (SYNTHROID) 150 MCG tablet TAKE 1 TABLET BY MOUTH EVERY DAY BEFORE BREAKFAST (Patient taking differently: Take one tablet on Mon, Tues. Thurs, Sat and 74mg on Sunday and Wednesdays)   methylPREDNISolone (MEDROL DOSEPAK) 4 MG TBPK tablet Take as prescribed on package.   pantoprazole (PROTONIX) 40 MG tablet TAKE 1 TABLET BY MOUTH EVERY DAY   rosuvastatin (CRESTOR) 5  MG tablet Take 1 tablet (5 mg total) by mouth at bedtime.   Solriamfetol HCl ([name]) 150 MG TABS Take 150 mg by mouth every morning.   vitamin E 100 UNIT capsule Take by mouth daily. Takes 1893m  vortioxetine HBr ([name]) 20 MG TABS tablet Take 20 mg by mouth daily.   No current facility-administered medications on file prior to visit.    ROS: all negative except above.   Physical Exam:  BP 124/90    Pulse 84    Temp (!) 97.2 F (36.2 C)    Resp 16    Wt 194 lb 9.6 oz (88.3 kg)    BMI 35.59 kg/m     GENERAL: Well nourished, well hydrated, no acute distress.  ENT: Mouth: Good dentition.  Throat: Oropharynx clear. No lymphadenopathy. CARDIOVASCULAR: Regular rate and rhythm, no thrills or palpable murmurs, S1, S2, no murmur, no rubs or gallops. Carotid arteries: No carotid bruits.  RESPIRATORY: Clear to auscultation bilaterally, no wheezes, rhonci or rales ABDOMEN: Soft, non-tender, non-distended, bowel sounds present, no rebound or guarding EXTREMITIES: No rashes or lesions No peripheral edema, cyanosis, or clubbing MENTAL STATUS EXAM: Orientation: Alert and oriented to person, place and time.  Memory: Cooperative, follows commands well. Recent and remote memory normal.  Attention, concentration: Attention span and concentration are normal.  Language: Speech is clear and language is normal.  Fund of knowledge: Aware of current events, vocabulary appropriate for patient age.  CRANIAL NERVES: CN 2-12 intact MOTOR: Strength symmetrical and intact 5/5 upper extremities, neck; she does 4/5 bil quads and tibialis anterior, otherwise 5/5, symmetrical.  Muscle Tone: Tone and muscle bulk are normal in the upper and lower extremities. There are no fasciculations.  REFLEXES: upper extremities 2+ symmetrically, Patellar: (R): 3+ (L): 3+, Achilles: (R): 3+ (L): 3+ COORDINATION: Intact finger-to-nose, heel-to-shin, and rapid alternating movements. No tremor.  SENSATION: Intact to light  touch, monofilament. Negative Romberg test.  GAIT: Routine and tandem gait are normal.   AsIzora RibasNP 1:16 PM [facility]

## 2020-07-02 ENCOUNTER — Ambulatory Visit: Payer: BC Managed Care – PPO | Admitting: Pulmonary Disease

## 2020-07-02 LAB — COMPLETE METABOLIC PANEL WITH GFR
AG Ratio: 1.8 (calc) (ref 1.0–2.5)
ALT: 11 U/L (ref 6–29)
AST: 10 U/L (ref 10–30)
Albumin: 4.6 g/dL (ref 3.6–5.1)
Alkaline phosphatase (APISO): 58 U/L (ref 31–125)
BUN: 19 mg/dL (ref 7–25)
CO2: 26 mmol/L (ref 20–32)
Calcium: 9.9 mg/dL (ref 8.6–10.2)
Chloride: 103 mmol/L (ref 98–110)
Creat: 0.7 mg/dL (ref 0.50–1.10)
GFR, Est African American: 125 mL/min/{1.73_m2} (ref >=60)
GFR, Est Non African American: 108 mL/min/{1.73_m2} (ref >=60)
Globulin: 2.6 g/dL (calc) (ref 1.9–3.7)
Glucose, Bld: 131 mg/dL — ABNORMAL HIGH (ref 65–99)
Potassium: 4.5 mmol/L (ref 3.5–5.3)
Sodium: 137 mmol/L (ref 135–146)
Total Bilirubin: 0.4 mg/dL (ref 0.2–1.2)
Total Protein: 7.2 g/dL (ref 6.1–8.1)

## 2020-07-02 LAB — CBC WITH DIFFERENTIAL/PLATELET
Absolute Monocytes: 484 cells/uL (ref 200–950)
Basophils Absolute: 22 cells/uL (ref 0–200)
Basophils Relative: 0.2 %
Eosinophils Absolute: 11 cells/uL — ABNORMAL LOW (ref 15–500)
Eosinophils Relative: 0.1 %
HCT: 41.8 % (ref 35.0–45.0)
Hemoglobin: 13.8 g/dL (ref 11.7–15.5)
Lymphs Abs: 2068 cells/uL (ref 850–3900)
MCH: 28.9 pg (ref 27.0–33.0)
MCHC: 33 g/dL (ref 32.0–36.0)
MCV: 87.6 fL (ref 80.0–100.0)
MPV: 11.1 fL (ref 7.5–12.5)
Monocytes Relative: 4.4 %
Neutro Abs: 8415 cells/uL — ABNORMAL HIGH (ref 1500–7800)
Neutrophils Relative %: 76.5 %
Platelets: 180 10*3/uL (ref 140–400)
RBC: 4.77 10*6/uL (ref 3.80–5.10)
RDW: 13.2 % (ref 11.0–15.0)
Total Lymphocyte: 18.8 %
WBC: 11 10*3/uL — ABNORMAL HIGH (ref 3.8–10.8)

## 2020-07-02 LAB — URINALYSIS W MICROSCOPIC + REFLEX CULTURE
Bacteria, UA: NONE SEEN /HPF
Bilirubin Urine: NEGATIVE
Glucose, UA: NEGATIVE
Hgb urine dipstick: NEGATIVE
Hyaline Cast: NONE SEEN /LPF
Ketones, ur: NEGATIVE
Leukocyte Esterase: NEGATIVE
Nitrites, Initial: NEGATIVE
Protein, ur: NEGATIVE
RBC / HPF: NONE SEEN /HPF (ref 0–2)
Specific Gravity, Urine: 1.01 (ref 1.001–1.03)
Squamous Epithelial / HPF: NONE SEEN /HPF (ref <=5)
WBC, UA: NONE SEEN /HPF (ref 0–5)
pH: 6.5 (ref 5.0–8.0)

## 2020-07-02 LAB — SEDIMENTATION RATE: Sed Rate: 9 mm/h (ref 0–20)

## 2020-07-02 LAB — CK: Total CK: 41 U/L (ref 29–143)

## 2020-07-02 LAB — NO CULTURE INDICATED

## 2020-07-02 LAB — TSH: TSH: 2.14 mIU/L

## 2020-07-03 ENCOUNTER — Encounter: Payer: Self-pay | Admitting: Neurology

## 2020-07-03 DIAGNOSIS — G723 Periodic paralysis: Secondary | ICD-10-CM

## 2020-07-03 DIAGNOSIS — F325 Major depressive disorder, single episode, in full remission: Secondary | ICD-10-CM

## 2020-07-06 NOTE — Telephone Encounter (Signed)
I will order an MS specific MRI brain.

## 2020-07-07 ENCOUNTER — Telehealth: Payer: Self-pay | Admitting: Neurology

## 2020-07-07 LAB — MYASTHENIA GRAVIS PANEL 2
A CHR BINDING ABS: <0.3 nmol/L
ACHR Blocking Abs: <15 % Inhibition (ref <15)
Acetylchol Modul Ab: <1 % Inhibition

## 2020-07-07 LAB — ALDOLASE: Aldolase: 5.2 U/L (ref <=8.1)

## 2020-07-07 LAB — C-REACTIVE PROTEIN: CRP: 3.3 mg/L (ref <8.0)

## 2020-07-07 NOTE — Telephone Encounter (Signed)
Pt called wanting to know if provider has read her Mychart message. Pt states that she is needing to discuss her Solriamfetol HCl ([name]) 150 MG TABS She states she is having trouble concentrating, weakness in the legs and arms and is very tiered. Please advise.

## 2020-07-07 NOTE — Telephone Encounter (Signed)
Pt replied to [employer] stating a call was no longer necessary.

## 2020-07-07 NOTE — Telephone Encounter (Signed)
no to the covid questions MR Brain w/wo contrast Dr. Thomasene Lot Auth: [id number] (exp. 07/07/20 to 01/02/21). Patient is scheduled at [facility] for 07/08/20.

## 2020-07-08 ENCOUNTER — Ambulatory Visit: Payer: BC Managed Care – PPO

## 2020-07-08 DIAGNOSIS — G723 Periodic paralysis: Secondary | ICD-10-CM

## 2020-07-08 DIAGNOSIS — F325 Major depressive disorder, single episode, in full remission: Secondary | ICD-10-CM

## 2020-07-08 MED ORDER — GADOBENATE DIMEGLUMINE 529 MG/ML IV SOLN
20.0000 mL | Freq: Once | INTRAVENOUS | Status: AC | PRN
  Administered 2020-07-08: 20 mL via INTRAVENOUS

## 2020-07-09 ENCOUNTER — Other Ambulatory Visit: Payer: Self-pay | Admitting: Physician Assistant

## 2020-07-10 ENCOUNTER — Other Ambulatory Visit: Payer: Self-pay | Admitting: Neurology

## 2020-07-10 NOTE — Telephone Encounter (Signed)
There may be no organic reason for these symptoms- and neurologically we have been through all tests I knew to do in context with your symptoms.

## 2020-07-14 ENCOUNTER — Telehealth: Payer: Self-pay | Admitting: Neurology

## 2020-07-14 NOTE — Telephone Encounter (Signed)
Called the patient to advise of the apt that is available 3:30 pm today. I have placed on hold. Advised the patient to either call me back or send a mychart message if she can take the apt today. Can be a mychart visit

## 2020-07-16 ENCOUNTER — Ambulatory Visit (INDEPENDENT_AMBULATORY_CARE_PROVIDER_SITE_OTHER): Payer: BC Managed Care – PPO | Admitting: Physician Assistant

## 2020-07-16 ENCOUNTER — Other Ambulatory Visit: Payer: Self-pay

## 2020-07-16 ENCOUNTER — Encounter: Payer: Self-pay | Admitting: Physician Assistant

## 2020-07-16 VITALS — BP 120/80 | HR 75 | Temp 97.5°F | Ht 62.0 in | Wt 194.6 lb

## 2020-07-16 DIAGNOSIS — M2550 Pain in unspecified joint: Secondary | ICD-10-CM

## 2020-07-16 DIAGNOSIS — E785 Hyperlipidemia, unspecified: Secondary | ICD-10-CM

## 2020-07-16 DIAGNOSIS — R29818 Other symptoms and signs involving the nervous system: Secondary | ICD-10-CM

## 2020-07-16 DIAGNOSIS — F3341 Major depressive disorder, recurrent, in partial remission: Secondary | ICD-10-CM

## 2020-07-16 DIAGNOSIS — G4733 Obstructive sleep apnea (adult) (pediatric): Secondary | ICD-10-CM | POA: Diagnosis not present

## 2020-07-16 DIAGNOSIS — I10 Essential (primary) hypertension: Secondary | ICD-10-CM | POA: Diagnosis not present

## 2020-07-16 DIAGNOSIS — G47419 Narcolepsy without cataplexy: Secondary | ICD-10-CM

## 2020-07-16 DIAGNOSIS — M797 Fibromyalgia: Secondary | ICD-10-CM

## 2020-07-16 NOTE — Progress Notes (Signed)
FOLLOW UP  Assessment and Plan:   Primary narcolepsy without cataplexy Continue neuro follow up  Fibromyalgia Monitor  Transient neurological symptoms ? Component of atypical migraines ? Able to restart adderall FOR focus and possible some depression component  Hypertension Well controlled off of medications at this time Monitor blood pressure at home; patient to call if consistently greater than 130/80  Continue DASH diet.   Reminder to go to the ER if any CP, SOB, nausea, dizziness, severe HA, changes vision/speech, left arm numbness and tingling and jaw pain.  Cholesterol Currently at LDL not goal; continue pravastatin; diet for trigs discussed, suggested omega 3 supplement Continue low cholesterol diet and exercise.  Check lipid panel.  May switch pravastatin to crestor to reach goal  Prediabetes Continue diet and exercise.  Perform daily foot/skin check, notify office of any concerning changes.  Check A1C  Hypothyroidism continue medications the same pending lab results reminded to take on an empty stomach 30-14mns before food.  check TSH level  Morbid Obesity with co morbidities Long discussion about weight loss, diet, and exercise Recommended diet heavy in fruits and veggies and low in animal meats, cheeses, and dairy products, appropriate calorie intake Discussed ideal weight for height  Will follow up in 3 months  Vitamin D Def Below goal at last visit;  continue supplementation to maintain goal of 70-100 Check Vit D level  Depression/anxiety with sleep disturbance discussed good sleep habits Continue medications; follow up with psych  Lifestyle discussed: diet/exerise, sleep hygiene, stress management, hydration   Continue diet and meds as discussed. Further disposition pending results of labs. Discussed med's effects and SE's.   Over 30 minutes of exam, counseling, chart review, and critical decision making was performed.   Future Appointments   Date Time Provider [place] 08/12/2020  8:30 AM Dohmeier, CAsencion Partridge MD [facility] None    ----------------------------------------------------------------------------------------------------------------------  HPI 39y.o. female  presents for 3 month follow up on hypertension, cholesterol, prediabetes, morbid obesity, hypothyroidism and vitamin D deficiency.   Has history of neuromuscular sx without conclusive dx (? Presumed fibromyalgia), Has had extensive lab workup, B12, copper, burgdorfi abi, rocky mtn spotted fever, negative ANA, anti- DNA. In last year had normal TSH, epstein barr, serum protein elctrophoresis, TSH, CBC, CMP, UA, B12, HIV, LHA-B27, RNP antibody, RF, comprehensive drug panel, neg MG.   She will have blurry vision occ, normally does not get headaches. She did have episodes of feel like her left face was heavy. She will feel body twitches. No personal history of migraines/no family history of migraines. She had recent normal MRI brain.  She was diagnosed with mild sleep apnea, likely narcolepsy with positive lymphocytic antigen HLA DQ A1 and DQ B1 on 12/04/2019, had MSLT 05/12/2020 and is now on solriamfetol 150 mg x Middle of July.  She is back on abilify 10 mg and trintellix 20 mg daily- started back in beginning of July.  She continues to have fatigue. She has been treated for depression but states she did well off the ability/trintellix other than related to fatigue. She states she has been having worsening memory issues, having trouble finding words. She states she feels that her memory issues got worse after not being on the adderall.   She has had worsening joint pain, pain in her hands and feet a few weeks after starting on the sunosi per patient. She is going to follow up with her orthopedic. No visible swelling on her hands and feet. She had recent negative  CRP, aldolase, Myasthenia gravis panel August 11.   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   BMI is Body mass index is 35.59 kg/m., she has been working on diet and exercise. Wt Readings from Last 3 Encounters:  07/16/20 194 lb 9.6 oz (88.3 kg)  07/01/20 194 lb 9.6 oz (88.3 kg)  06/26/20 192 lb (87.1 kg)   Her blood pressure has been controlled at home, today their BP is BP: 120/80  She does not workout. She denies chest pain, shortness of breath, dizziness.   She is on cholesterol medication Rosuvastatin 5 mg daily and denies myalgias. Her LDL cholesterol is at goal. The cholesterol last visit was:   Lab Results  Component Value Date   CHOL 139 04/15/2020   HDL 49 (L) 04/15/2020   LDLCALC 68 04/15/2020   LDLDIRECT 114.9 04/30/2012   TRIG 138 04/15/2020   CHOLHDL 2.8 04/15/2020   Lab Results  Component Value Date   CKTOTAL 41 07/01/2020    She has been working on diet and exercise for prediabetes, and denies increased appetite, nausea, paresthesia of the feet, polydipsia, polyuria and visual disturbances. Last A1C in the office was:  Lab Results  Component Value Date   HGBA1C 5.5 04/15/2020   She is on thyroid medication. Her medication was changed last visit.  Currently taking 1 tab DAILY rather than 1/2 on Sunday and Wednesday Lab Results  Component Value Date   TSH 2.14 07/01/2020   Patient is on Vitamin D supplement.   Lab Results  Component Value Date   VD25OH 72 04/15/2020        Current Medications:  Current Outpatient Medications on File Prior to Visit  Medication Sig  . ALPRAZolam (XANAX) 0.5 MG tablet TAKE 1 TABLET BY MOUTH EVERY DAY AT BEDTIME AS NEEDED  . ARIPiprazole ([name]) 10 MG tablet Take one half tablet by mouth daily, 5 mg daily  . Cholecalciferol (VITAMIN D3) 125 MCG (5000 UT) CAPS Take 1 capsule by mouth daily.  . cyclobenzaprine (FLEXERIL) 5 MG tablet Take 1 tablet (5 mg total)  by mouth 3 (three) times daily as needed for muscle spasms.  . Iron-Vitamin C 65-125 MG TABS Take by mouth daily.  Marland Kitchen L-Methylfolate-Algae (DEPLIN 15 PO) Take by mouth.  . levothyroxine (SYNTHROID) 150 MCG tablet TAKE 1 TABLET BY MOUTH EVERY DAY BEFORE BREAKFAST (Patient taking differently: Take one tablet on Mon, Tues. Thurs, Sat and 39mg on Sunday and Wednesdays)  . methylPREDNISolone (MEDROL DOSEPAK) 4 MG TBPK tablet Take as prescribed on package.  . pantoprazole (PROTONIX) 40 MG tablet TAKE 1 TABLET BY MOUTH EVERY DAY  . rosuvastatin (CRESTOR) 5 MG tablet Take 1 tablet (5 mg total) by mouth at bedtime.  . [name] 150 MG TABS TAKE 150 MG BY MOUTH EVERY MORNING.  . vitamin E 100 UNIT capsule Take by mouth daily. Takes 1875m . vortioxetine HBr ([name]) 20 MG TABS tablet Take 20 mg by mouth daily.   No current facility-administered medications on file prior to visit.     Allergies:  Allergies  Allergen Reactions  . Lamictal [Lamotrigine] Rash  . Doxycycline Other (See Comments)    GI UPSET  . Effexor [Venlafaxine] Other (See Comments)    DYSPHORIA  . Savella [Milnacipran Hcl] Other (See Comments)  NO RELIEF  . Prilosec [Omeprazole] Itching     Medical History:  Past Medical History:  Diagnosis Date  . Anemia   . Anxiety   . Constipation   . Depression   . Fibromyalgia   . GERD (gastroesophageal reflux disease)   . Hemorrhoids   . HSV-1 infection   . Hypertension   . Hypothyroidism   . OSA on CPAP   . Other abnormal glucose   . Preeclampsia    Family history- Reviewed and unchanged Social history- Reviewed and unchanged   Review of Systems:  Review of Systems  Constitutional: Positive for malaise/fatigue. Negative for chills, fever and weight loss.  HENT: Negative for ear discharge, ear pain, hearing loss and tinnitus.   Eyes: Negative for blurred vision and double vision.  Respiratory: Positive for shortness of breath. Negative for cough, hemoptysis, sputum  production and wheezing.   Cardiovascular: Positive for palpitations. Negative for chest pain, orthopnea, claudication and leg swelling.  Gastrointestinal: Negative for abdominal pain, blood in stool, constipation, diarrhea, heartburn, melena, nausea and vomiting.  Genitourinary: Negative.   Musculoskeletal: Negative for joint pain and myalgias.  Skin: Negative for rash.  Neurological: Negative for dizziness, tingling, sensory change, weakness and headaches.  Endo/Heme/Allergies: Negative for polydipsia.  Psychiatric/Behavioral: Positive for depression. Negative for substance abuse and suicidal ideas. The patient is nervous/anxious. The patient does not have insomnia.   All other systems reviewed and are negative.    Physical Exam: BP 120/80   Pulse 75   Temp (!) 97.5 F (36.4 C)   Ht 5' 2"  (1.575 m)   Wt 194 lb 9.6 oz (88.3 kg)   SpO2 98%   BMI 35.59 kg/m  Wt Readings from Last 3 Encounters:  07/16/20 194 lb 9.6 oz (88.3 kg)  07/01/20 194 lb 9.6 oz (88.3 kg)  06/26/20 192 lb (87.1 kg)   General Appearance: Well nourished, in no apparent distress. Eyes: PERRLA, EOMs, conjunctiva no swelling or erythema Sinuses: No Frontal/maxillary tenderness ENT/Mouth: R Ext aud canals clear, TM without erythema, bulging. L ext aud canal inflamed/narrowed, mildly erythematous, tragus tenderness, no mastoid bogginess, no discharge. No erythema, swelling, or exudate on post pharynx.  Tonsils not swollen or erythematous. Hearing normal.  Neck: Supple, thyroid normal.  Respiratory: Respiratory effort normal, BS equal bilaterally without rales, rhonchi, wheezing or stridor.  Cardio: RRR with no MRGs. Brisk peripheral pulses without edema.  Abdomen: Soft, + BS.  Non tender, no guarding, rebound, hernias, masses. Lymphatics: Non tender without lymphadenopathy.  Musculoskeletal: Full ROM, 5/5 strength, Normal gait Skin: Warm, dry without rashes, lesions, ecchymosis.  Neuro: Cranial nerves intact. No  cerebellar symptoms.  Psych: Awake and oriented X 3, normal affect, Insight and Judgment appropriate.    Vicie Mutters, PA-C 3:47 PM [facility]

## 2020-07-22 ENCOUNTER — Encounter: Payer: Self-pay | Admitting: Orthopaedic Surgery

## 2020-07-22 ENCOUNTER — Other Ambulatory Visit: Payer: Self-pay

## 2020-07-22 ENCOUNTER — Ambulatory Visit: Payer: BC Managed Care – PPO | Admitting: Orthopaedic Surgery

## 2020-07-22 DIAGNOSIS — M65311 Trigger thumb, right thumb: Secondary | ICD-10-CM

## 2020-07-22 MED ORDER — METHYLPREDNISOLONE ACETATE 40 MG/ML IJ SUSP
20.0000 mg | INTRAMUSCULAR | Status: AC | PRN
  Administered 2020-07-22: 20 mg

## 2020-07-22 MED ORDER — LIDOCAINE HCL 1 % IJ SOLN
0.5000 mL | INTRAMUSCULAR | Status: AC | PRN
  Administered 2020-07-22: .5 mL

## 2020-07-22 NOTE — Progress Notes (Signed)
Office Visit Note   Patient: Lauren [name]           Date of Birth: 09/17/1981           MRN: [id number] Visit Date: 07/22/2020              Requested by: Lucky Cowboy, MD [address] [place],  [place] [address] PCP: Melvyn Novas, MD   Assessment & Plan: Visit Diagnoses:  1. Trigger thumb, right thumb     Plan: Status post successful left trigger thumb release.  A little bit of soreness but no significant pain or recurrent triggering.  Having similar symptoms of the right thumb.  After much discussion she preferred to have an injection rather than proceed with surgery.  Follow-Up Instructions: Return if symptoms worsen or fail to improve.   Orders:  Orders Placed This Encounter  Procedures  . Hand/UE Inj: R thumb A1   No orders of the defined types were placed in this encounter.     Procedures: Hand/UE Inj: R thumb A1 for trigger finger on 07/22/2020 4:02 PM Details: 27 G needle, volar approach Medications: 0.5 mL lidocaine 1 %; 20 mg methylPREDNISolone acetate 40 MG/ML      Clinical Data: No additional findings.   Subjective: Chief Complaint  Patient presents with  . Right Hand - Pain  . Left Hand - Pain  Patient presents today for her right thumb. Her right thumb has been bothering her since June. She said that it pops and locks up. Usually its worse in the morning, but today it has been all day. She also states that all her fingers and toes are sore. She thinks she may have arthritis in her digits. Her left thumb trigger finger release was on 02/27/2020. Her left thumb is doing well. She is right hand dominant.   HPI  Review of Systems   Objective: Vital Signs: Ht 5\' 2"  (1.575 m)   Wt 194 lb (88 kg)   BMI 35.48 kg/m   Physical Exam  Ortho Exam pain over up small nodule on the palmar aspect of the right thumb at the level of the metacarpal phalangeal joint.  Painful flexion of the IP joint.  Neurovascular exam  intact.  Specialty Comments:  No specialty comments available.  Imaging: No results found.   PMFS History: Patient Active Problem List   Diagnosis Date Noted  . Trigger thumb, right thumb 07/22/2020  . Mild obstructive sleep apnea 05/21/2020  . Hypersomnia, persistent 05/21/2020  . Pain in right wrist 04/21/2020  . Trigger thumb, left thumb 01/28/2020  . Menorrhagia with irregular cycle 01/22/2020  . Urine pregnancy test negative 01/22/2020  . Uterine enlargement 01/22/2020  . Major depressive disorder, recurrent episode, moderate (HCC) 12/03/2019  . Carpal tunnel syndrome, bilateral 06/21/2019  . 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 Liver lesion, left lobe 10/04/2018  . Fibromyalgia 06/04/2018  . Obstructive sleep apnea treated with continuous positive airway pressure (CPAP) 05/23/2018  . 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 Hypersomnia with sleep apnea 01/16/2018  . Hepatomegaly 12/21/2017  . Hepatic steatosis 12/21/2017  . Headache associated with sexual activity 03/08/2017  . Dizziness 03/08/2017  . Medication management 09/09/2015  . Abnormal glucose 06/02/2015  . Hyperlipidemia LDL goal <100 06/02/2015  . Morbid obesity (HCC) 06/02/2015  . Depression   . Hypertension   . Hypothyroidism 04/30/2012  . 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 Paraparesis (HCC) 02/02/2012   Past Medical History:  Diagnosis Date  . Anemia   .  Anxiety   . Constipation   . Depression   . Fibromyalgia   . GERD (gastroesophageal reflux disease)   . Hemorrhoids   . HSV-1 infection   . Hypertension   . Hypothyroidism   . OSA on CPAP   . Other abnormal glucose   . Preeclampsia     Family History  Problem Relation Age of Onset  . Hypothyroidism Mother   . Cervical cancer Mother   . Bipolar disorder Mother   . Diabetes Mother   . Colon polyps Mother   . Bipolar disorder Brother   . Kidney disease Brother   . Alcohol abuse Brother   . Hypothyroidism Daughter   . Heart  disease Maternal Grandmother   . Diabetes Maternal Grandmother   . Cancer Maternal Grandmother        SKIN  . Hyperlipidemia Maternal Grandmother   . Hypertension Maternal Grandmother   . Hypothyroidism Maternal Grandmother   . Lung cancer Maternal Grandfather   . Heart disease Maternal Aunt   . Diabetes Maternal Aunt   . Diabetes Cousin   . Colon cancer Neg Hx     Past Surgical History:  Procedure Laterality Date  . CARPAL TUNNEL RELEASE Right 02/2020  . CHOLECYSTECTOMY  2006  . TONSILLECTOMY    . WISDOM TOOTH EXTRACTION     Social History   Occupational History  . Occupation: Magazine features editor: [employer]  Tobacco Use  . Smoking status: Never Smoker  . Smokeless tobacco: Never Used  Vaping Use  . Vaping Use: Never used  Substance and Sexual Activity  . Alcohol use: Yes    Alcohol/week: 0.0 standard drinks    Comment: occ  . Drug use: No  . Sexual activity: Yes    Partners: Male    Birth control/protection: None, Surgical    Comment: husband has had a vasectomy

## 2020-07-23 ENCOUNTER — Encounter: Payer: Self-pay | Admitting: Orthopaedic Surgery

## 2020-07-23 ENCOUNTER — Ambulatory Visit: Payer: BC Managed Care – PPO | Admitting: Orthopaedic Surgery

## 2020-07-28 MED ORDER — AMPHETAMINE-DEXTROAMPHETAMINE 10 MG PO TABS: ORAL_TABLET | ORAL | 0 refills | Status: DC

## 2020-07-28 NOTE — Addendum Note (Signed)
Addended by: Judi Cong on: 07/28/2020 08:46 AM   Modules accepted: Orders

## 2020-07-29 ENCOUNTER — Other Ambulatory Visit: Payer: Self-pay | Admitting: Physician Assistant

## 2020-08-09 IMAGING — MR MR ABDOMEN WO/W CM
10 of 17 series · 27 of 48 positions shown · IV contrast (multihance)
Comparison: 01/06/2018

CLINICAL DATA: Follow-up indeterminate liver lesion. Hepatic
steatosis.

EXAM:
MRI ABDOMEN WITHOUT AND WITH CONTRAST
TECHNIQUE: Multiplanar multisequence MR imaging of the abdomen was performed
both before and after the administration of intravenous contrast.
CONTRAST:  17mL MULTIHANCE GADOBENATE DIMEGLUMINE 529 MG/ML IV SOLN

[Series 3: cor haste · coronal · 5.0mm · 0.74mm/px · 2 of 36 slices shown]
[im 1/36]
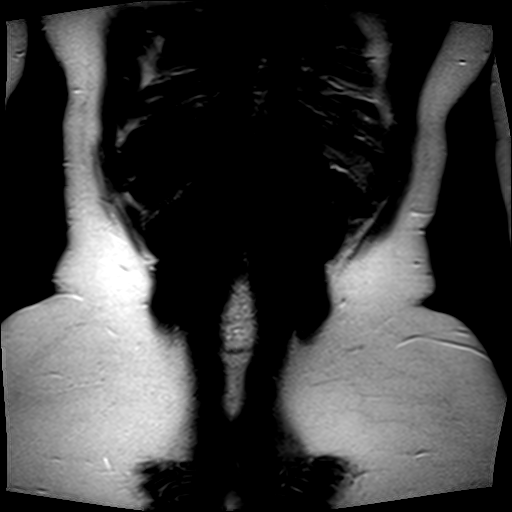
[im 36/36]
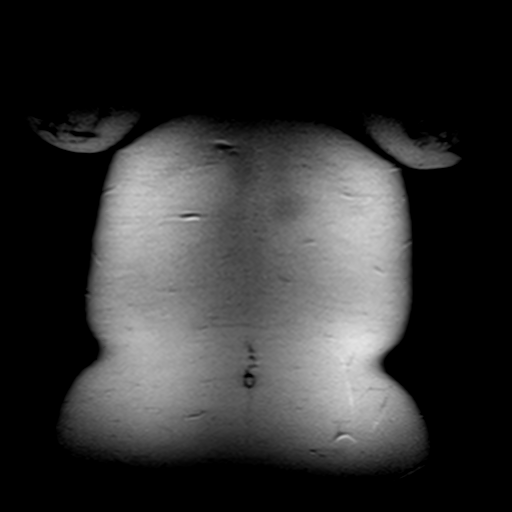

[Series 4: T1 · axial · 6.0mm · 0.74mm/px · z∈[-90,+128]mm · 4 of 68 slices shown]
[im 1/68]
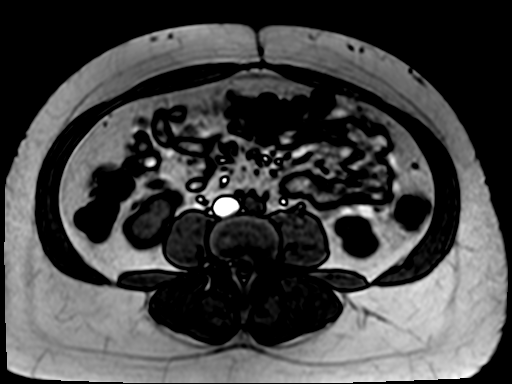
[im 23/68]
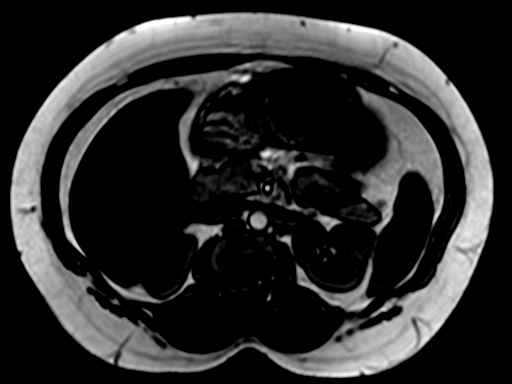
[im 45/68]
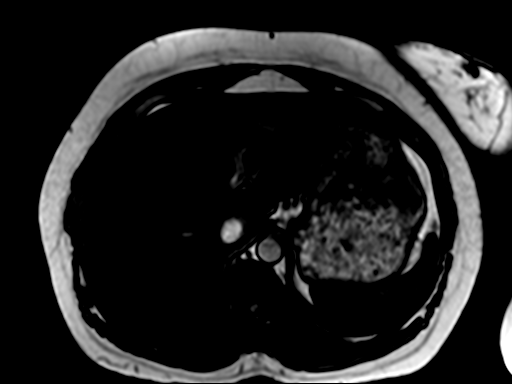
[im 68/68]
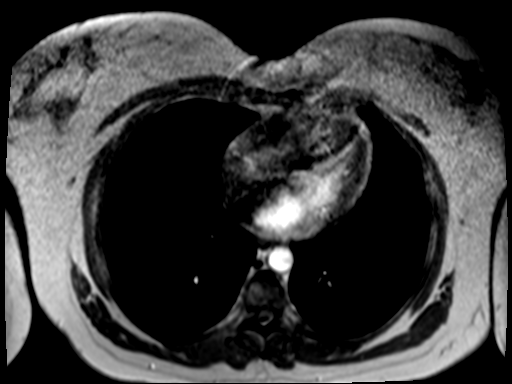

[Series 5: bSSFP · axial · 5.0mm · 0.74mm/px · z∈[-86,+134]mm · 3 of 45 slices shown]
[im 1/45]
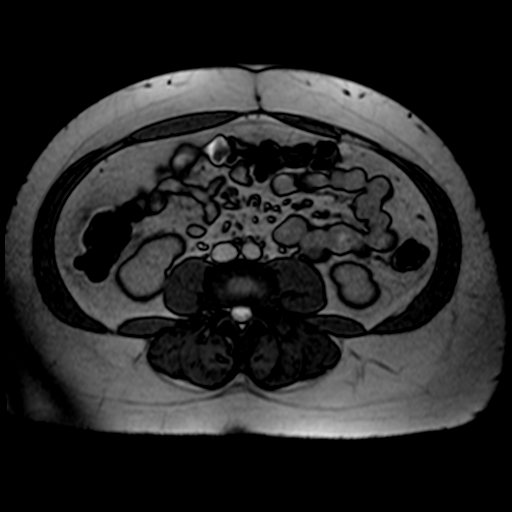
[im 23/45]
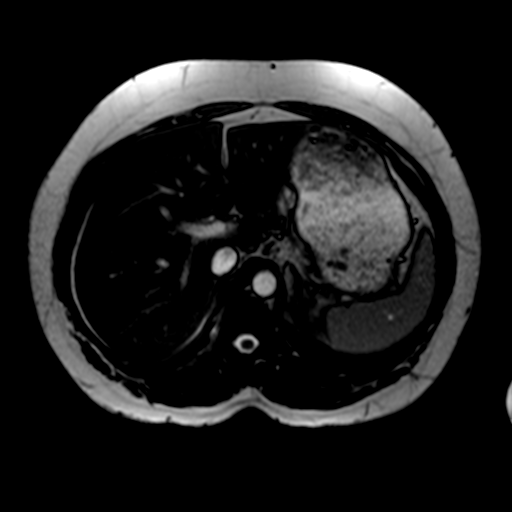
[im 45/45]
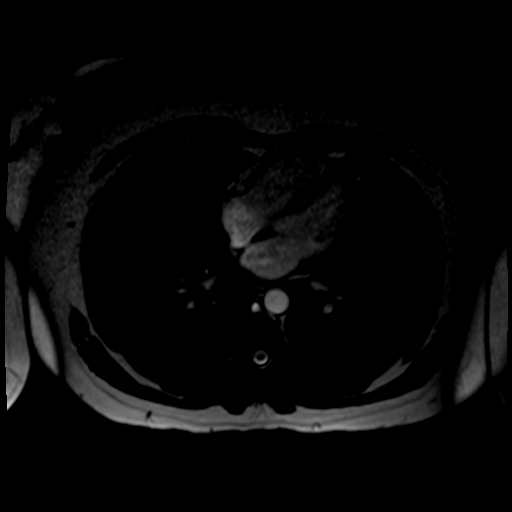

[Series 6: axial haste · axial · 6.0mm · 0.74mm/px · z∈[-97,+134]mm · 2 of 36 slices shown]
[im 1/36]
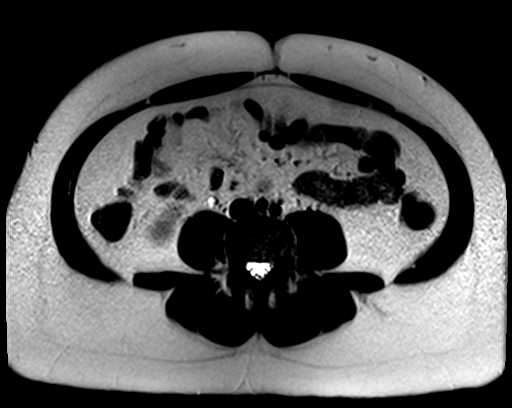
[im 36/36]
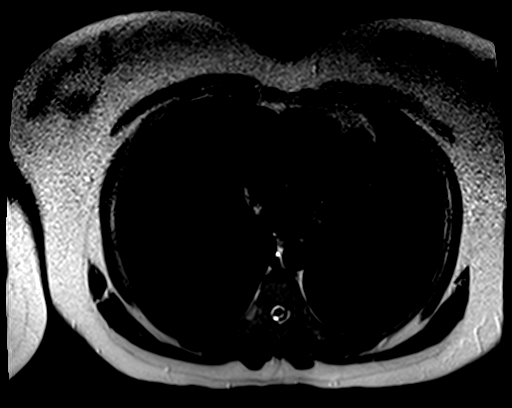

[Series 7: T2 · axial · 6.0mm · 1.12mm/px · z∈[-77,+168]mm · 2 of 35 slices shown]
[im 1/35]
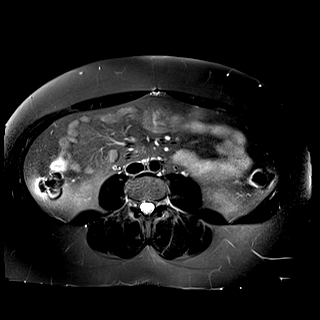
[im 35/35]
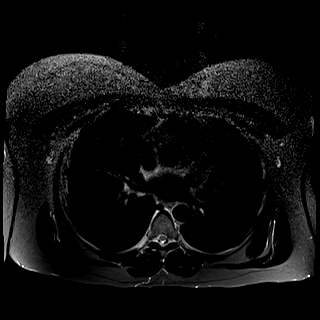

[Series 8: [id number]_trig · axial · 6.0mm · 1.98mm/px · z∈[-71,+167]mm · 4 of 102 slices shown]
[im 1/102]
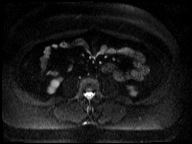
[im 34/102]
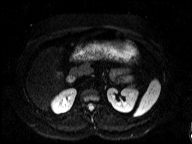
[im 68/102]
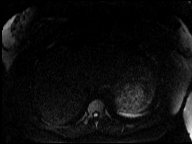
[im 102/102]
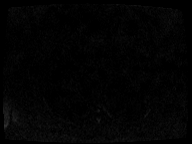

[Series 9: [id number]_trig_adc · axial · 6.0mm · 1.98mm/px · 1 of 34 slices shown]
[im 1/34]
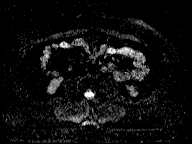

[Series 10: T1 dynamic · axial · non-contrast · 2.5mm · 0.74mm/px · z∈[-86,+131]mm · 3 of 88 slices shown]
[im 1/88]
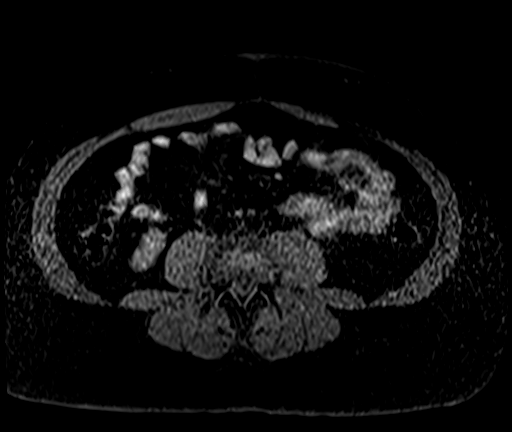
[im 44/88]
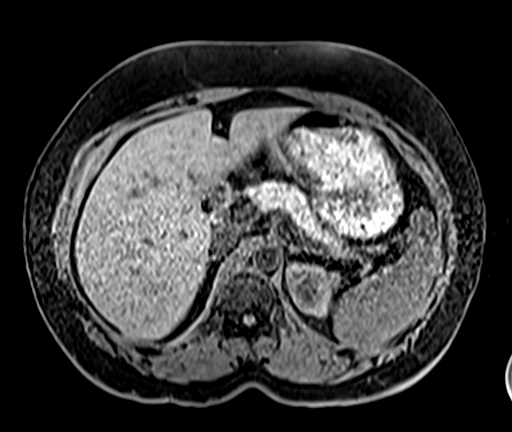
[im 88/88]
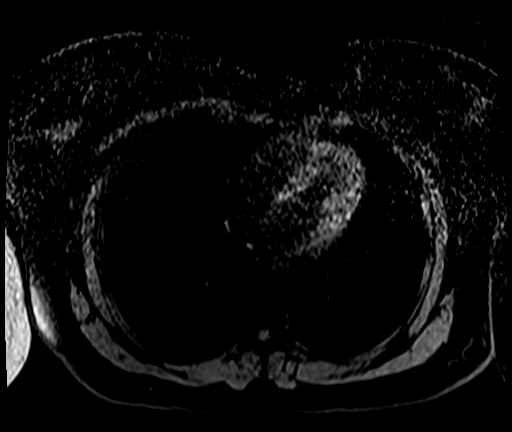

[Series 11: T1 dynamic post-contrast · axial · 2.5mm · 0.74mm/px · z∈[-86,+131]mm · 3 of 88 slices shown (1 of 2)]
[im 1/88]
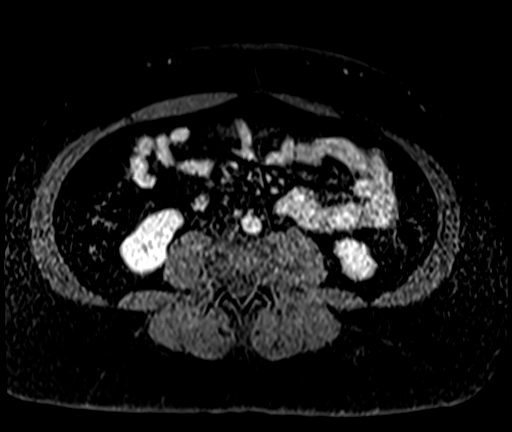
[im 44/88]
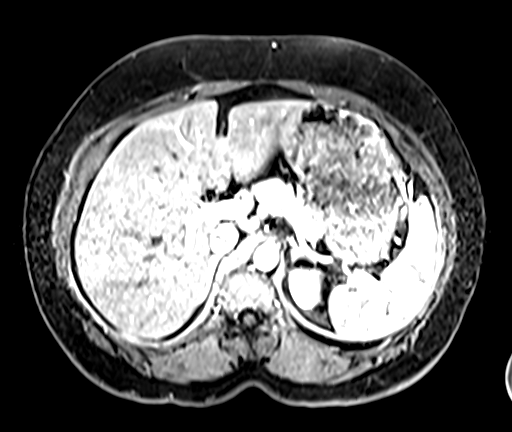
[im 88/88]
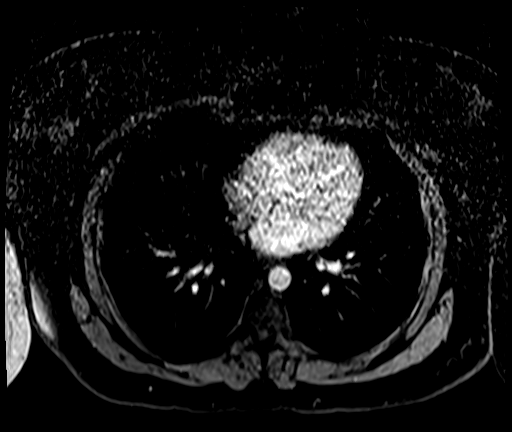

[Series 12: T1 dynamic post-contrast · axial · 2.5mm · 0.74mm/px · z∈[-86,+131]mm · 3 of 88 slices shown (2 of 2)]
[im 1/88]
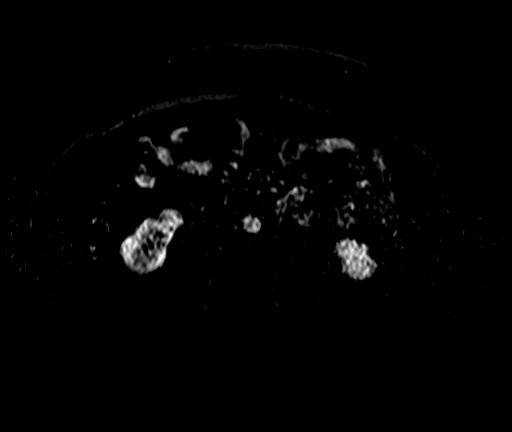
[im 44/88]
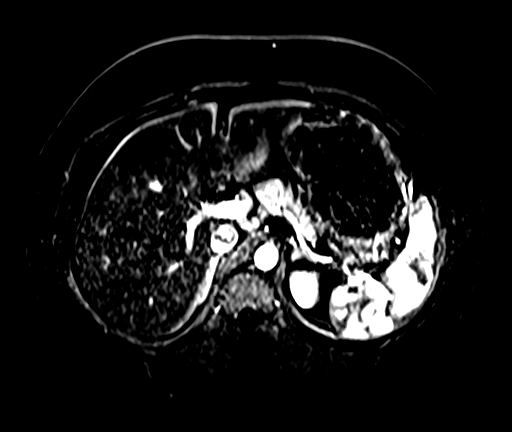
[im 88/88]
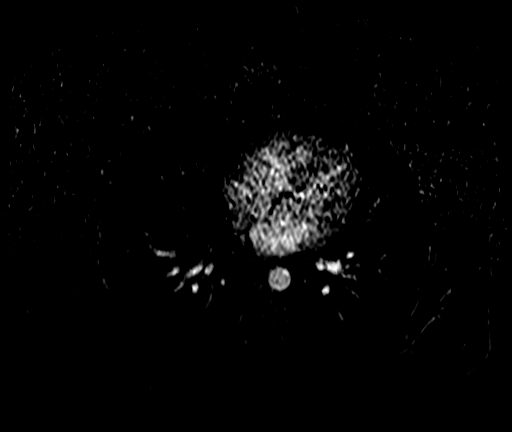

[27 of 48 positions shown; findings below may reference images not displayed]

FINDINGS: Lower chest: No acute findings.

Hepatobiliary: Diffuse hepatic steatosis again demonstrated. A small
hypervascular lesion is seen in segment 3 of the left lobe measuring
1.4 x 0.9 cm on image [DATE]. This lesion shows lack of contrast
washout and T2 isointensity to normal hepatic parenchyma. This is
decreased in size from 1.8 x 1.3 cm on previous study, and most
likely considerations include benign focal nodular hyperplasia and
hepatic adenoma. No other liver lesions are identified. Prior
cholecystectomy. No evidence of biliary obstruction.

Pancreas:  No mass or inflammatory changes.

Spleen:  Within normal limits in size and appearance.

Adrenals/Urinary Tract: No masses identified. No evidence of
hydronephrosis.

Stomach/Bowel: Visualized portion unremarkable.

Vascular/Lymphatic: No pathologically enlarged lymph nodes
identified. No abdominal aortic aneurysm.

Other:  None.

Musculoskeletal:  No suspicious bone lesions identified.
IMPRESSION: 1. Decreased size of 1.4 cm hypervascular lesion in segment 3 of the
left lobe, consistent with benign etiology such as focal nodular
hyperplasia or hepatic adenoma.
2. Stable diffuse hepatic steatosis.

## 2020-08-12 ENCOUNTER — Encounter: Payer: Self-pay | Admitting: Neurology

## 2020-08-12 ENCOUNTER — Ambulatory Visit: Payer: BC Managed Care – PPO | Admitting: Neurology

## 2020-08-12 ENCOUNTER — Other Ambulatory Visit: Payer: Self-pay | Admitting: Physician Assistant

## 2020-08-12 VITALS — BP 115/69 | HR 77 | Ht 62.0 in | Wt 189.5 lb

## 2020-08-12 DIAGNOSIS — G47419 Narcolepsy without cataplexy: Secondary | ICD-10-CM

## 2020-08-12 DIAGNOSIS — G47429 Narcolepsy in conditions classified elsewhere without cataplexy: Secondary | ICD-10-CM | POA: Insufficient documentation

## 2020-08-12 MED ORDER — AMPHETAMINE-DEXTROAMPHET ER 10 MG PO CP24: 10.0000 mg | ORAL_CAPSULE | Freq: Every day | ORAL | 0 refills | Status: DC

## 2020-08-12 MED ORDER — SUNOSI 150 MG PO TABS
150.0000 mg | ORAL_TABLET | ORAL | 5 refills | Status: DC
Start: 2020-08-12 — End: 2021-01-12

## 2020-08-12 NOTE — Progress Notes (Signed)
SLEEP MED[facility]INE CLINIC   Provider:  Larey Seat, M D  Primary Care Physician:  Larey Seat, MD   Referring Provider: Unk Pinto, MD   Chief Complaint  Patient presents with   Follow-up    Room 10. Alone. Reports having increased sleepiness during the day and difficulty with concentration.     HPI:   RV 08-12-2020: I have the pleasure of meeting with Lauren [name] today 3 months after she underwent a new attempt at a CPAP directed baseline polysomnography with an MSLT to follow.  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  She endorsed the Epworth Sleepiness Scale between 16 and 14 points at various times, she had no apnea using CPAP in the nightly study REM latency was still 60 minutes to my surprise, the multiple sleep latency test showed sleep onset with an average of 8 minutes which is a central sleep latency than desired however we were again unable to find any REM latencies or REM sleep onset.  Clinically the patient presents with symptoms that are very reminiscent of narcolepsy and there is a possibility that this is an idiopathic hypersomnia that still can convert to early REM onset.  The patient had appropriately weaned off all REM suppressing medication prior to the test.  We discussed today to continue [name] which gave her a better daytime control of sleepiness without inducing insomnia at night.  She still feels that after lunch her concentration and wakefulness is decreasing.   And we discussed today to add in [name] form to help her through the day.  We also discussed that [name] and the highway for now have a new indication for idiopathic hypersomnia and excessive daytime sleepiness in patients such as [name] for but have not been able to be diagnosed with narcolepsy by the gold standard test of MSLT.  I also discussed the medication in detail about how to receive by mail, and  that it has to be in a cool place away from food, but it has to be taken twice at night and that it induces a very deep sleep indeed sometimes with great difficulties to arouse the patient should that be any alarm or any situation coming up.  The patient lives with her spouse and teenage daughter and her 37 year old daughter and I think that her home environment is conducive to try anxiety with if the need arises.  So today I will add 10 mg XR [name] in addition to [facility] and I will let her tell me in 4 to 8 weeks if this is going well otherwise we will change her to [name] wave.     RV 04-01-2020 :  Lauren [name] is a 39 y.o. female , and seen here in a Rv  for a hypersomnia evaluation- RN : pt with husband, rm 31. presents today as a follow up. DME [facility] -CPAP  is working well. She is still having concerns of daytime sleepiness. states that she will take the [name]  in the morning which it helps but still feels tired.  she will take the immediate release at lunch time and will again helps her stay alert until 7/7:30 pm but she feels the medication is allowing her to be able to function but she still feels tired.   The patient has been an excellent user of CPAP she is using an auto titration device between 5 and 12 cm  of water pressure with 3 cm EPR and her 95th percentile pressure is 7.3 cm water.  Residual AHI is 0.2 which is an excellent resolution of any apnea.  She is 100% compliant for 30 out of 30 days of use and 97% compliance with 4 hours of use.  She had only 1 day where she did not use 4 hours consecutively.  The average user time is 8 hours and 3 minutes at night there are very minor air leaks overall this is very effective therapy but the past to my concern and the patient's not helped to make her less sleepy in daytime.  She remains on [name] with an Epworth Sleepiness Scale result of 12 points today. There has been no interval medical history-  She takes 20 mg [name]  and 10 mg in PM.  She is a very light sleeper.  She feels that CPAP prevents her from waking up as frequently. She averages 8- 10 hours of sleep. She takes an iron supplement- her % saturation was only 10% in 10-2019.  Component 2 yr ago  DQA1*01:02 Positive   DQB1*06:02 Positive   Comment: Final Results:  DQA1*01:AXZVC,03:RV    MSLT may be next step now- she can wean off medication that depresses REM sleep.  We need to wean off [name], [name], [name],   In preparation for an MSLT during the summer break I would like for the patient to temporarily take [name] and extended release so that she can use it on a as needed basis and it would be sufficient for a valid MSLT to discontinue the use 8 days prior to the sleep test.  As to [name] the patient only uses 5 mg which is a health tablet at her currently prescribed dose of 10 mg tablets.  She can go to every other day 5 mg for 1 week and then should be able to stay off [name] for another 10 days prior to test.  It is very important that we schedule the MSLT so that these steps can be planned correctly in advance, I have to speak to Dr. Robina Ade about the [name] and how to best be met and not as familiar with the medication and its half-life.  Since it is also a tablet it should be possible to cut the dose in half and go to every other day but I will confirm with Dr. Buddy Duty that this is what she permits me to do.  My goal is to have the MSLT and early mid June.  So that we have time to wean during the school summer break.    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  [name] underwent a sleep study on 19 January 2018 at the time she was treated with Xanax, Pristiq, Latuda Pravachol and Synthroid.  She also had a BMI of 37 and an Epworth sleepiness scale was endorsed at 11 points.  She experienced temporary spells of weakness and daily hypersomnia.  Her AHI was 3.1/h  the REM AHI was 7.5 usually these levels of sleep apnea are not  considered clinically significant.  However there was a quite loud primary snoring noted and the patient still had insomnia and hypersomnia concerns.  The patient decided to try out CPAP, and she has been compliant she has been using the machine 27 out of 30 days with a 76% compliance for hours average user time 5 hours 33 minutes AutoSet is between 5 and 12 cmH2O with 3 cm  EPR and her AHI was reduced to 0.3 which is a further successful reduction of an already mild apnea it did not have any effect on her sleepiness.  A repeat laboratory test for review with lymphocytic antigen HLA DQ A1 and DQ B1 was obtained on 13 January by [facility] and she to turn positive for both alleles alleles.  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   We discuss the process how to obtain a valid MSLT ;she ried to wean off latuda and failed, she couldn't work , not able to function. She is not sure she likes her CPAP and given the w baseline AHI I am not convinced it can change much. However, the diagnosis of OSA can help [place] to start her on modafinil.  She has been using [name] with Dr Malachy Moan order, she has seen Vicie Mutters, PA.   I will order modafinil or Armodafinil. I will establish a weaning schedule for her REM supressant medications. If she cannot use CPAP 75-80% of the time compliantly , it is not worth to continue.    Dr. Jannifer Franklin patient ,  I had the pleasure of meeting with [facility] before about 6-1/2 years ago she had undergone a sleep study at [facility] sleep.  She has been followed by Dr. Jannifer Franklin at the time who also ordered a sleep study.  In the meantime she has been followed by her primary care physician, and has seen Dr. Adele Schilder.  She has a diagnosis of hypothyroidism, fibromyalgia, excessive fatigue.  She reports that she had been under more stress at work and at home.  The patient is also a mother of 2 with her youngest son will be 66 years old.  After the birth of either she experienced  temporary spells of weakness.  With each of the deliveries she had received magnesium, which can be the most likely cause for perinatal weakness.  Chief complaint according to patient : "depression, leaden fatigue, weight gain, and myalgia" I sometimes feel it coming down on me and I just need to sleep, have taken 2 naps while waiting here."  Sleep habits are as follows: The patient usually goes to bed around 8:30 PM, she takes Xanax at bedtime and is promptly asleep within 20-30 minutes.  The bedroom is cool, quiet and dark.  She prefers to sleep on her side or prone , and sleeps on one pillow.  I sleeps through the night, wakes up at 6 AM, on weekends she stays much longer in bed -until 10 AM. She estimates 9 hours of sleep on average. She wakes without headaches or dry mouth. She dreams a lot, has one or none bathroom break. Her husband noted her just to have started to snore, and suspected apnea.  She naps frequently in daytime. She works as a Pharmacist, hospital in [employer]. Her naps are 3-5 minutes long.  Excessive daytime sleepiness and fatigue, 3-5 minute power naps, wakes up from her own snoring. Sleep paralysis in the past, none recent. Immediate dreams when napping. She cannot recall cataplectic spells.    Sleep medical history and family sleep history:  Excessive daytime sleepiness and fatigue, 3-5 minute power naps, wakes up from her own snoring. Mother is sleepy, sons are not.   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 Daughters  age 33  and 48. No smoking history, No ETOH, caffeine - two a day.   Review of Systems: Out of a complete 14 system review,  the patient complains of only the following symptoms, and all other reviewed systems are negative.  snoring, weight gain, fatigued.   Epworth score  12 on adderall- - but with naps and sleep attacks.  , Fatigue severity score 60  , depression score n/a -   How likely are you to doze in the following  situations: 0 = not likely, 1 = slight chance, 2 = moderate chance, 3 = high chance  Sitting and Reading? Watching Television? Sitting inactive in a public place (theater or meeting)? Lying down in the afternoon when circumstances permit? Sitting and talking to someone? Sitting quietly after lunch without alcohol? In a car, while stopped for a few minutes in traffic? As a passenger in a car for an hour without a break?  Total = 15/ 24 without [name] and   9 points on [name] if not allowed to nap! Will need to look next time at Epworth score in reflection of [name].       Social History   Socioeconomic History   Marital status: Married    Spouse name: Not on file   Number of children: 2   Years of education: Not on file   Highest education level: Not on file  Occupational History   Occupation: TEACHER    Employer: [employer]  Tobacco Use   Smoking status: Never Smoker   Smokeless tobacco: Never Used  Scientific laboratory technician Use: Never used  Substance and Sexual Activity   Alcohol use: Yes    Alcohol/week: 0.0 standard drinks    Comment: occ   Drug use: No   Sexual activity: Yes    Partners: Male    Birth control/protection: None, Surgical    Comment: husband has had a vasectomy  Other Topics Concern   Not on file  Social History Narrative   4 caffeine drinks daily    Social Determinants of Health   Financial Resource Strain:    Difficulty of Paying Living Expenses: Not on file  Food Insecurity:    Worried About Charity fundraiser in the Last Year: Not on file   [employer] of Food in the Last Year: Not on file  Transportation Needs:    Lack of Transportation (Medical): Not on file   Lack of Transportation (Non-Medical): Not on file  Physical Activity:    Days of Exercise per Week: Not on file   Minutes of Exercise per Session: Not on file  Stress:    Feeling of Stress : Not on file  Social Connections:    Frequency of  Communication with Friends and Family: Not on file   Frequency of Social Gatherings with Friends and Family: Not on file   Attends Religious Services: Not on file   Active Member of Clubs or Organizations: Not on file   Attends Archivist Meetings: Not on file   Marital Status: Not on file  Intimate Partner Violence:    Fear of Current or Ex-Partner: Not on file   Emotionally Abused: Not on file   Physically Abused: Not on file   Sexually Abused: Not on file    Family History  Problem Relation Age of Onset   Hypothyroidism Mother    Cervical cancer Mother    Bipolar disorder Mother    Diabetes Mother    Colon polyps Mother    Bipolar disorder Brother    Kidney disease Brother    Alcohol abuse Brother    Hypothyroidism Daughter  Heart disease Maternal Grandmother    Diabetes Maternal Grandmother    Cancer Maternal Grandmother        SKIN   Hyperlipidemia Maternal Grandmother    Hypertension Maternal Grandmother    Hypothyroidism Maternal Grandmother    Lung cancer Maternal Grandfather    Heart disease Maternal Aunt    Diabetes Maternal Aunt    Diabetes Cousin    Colon cancer Neg Hx     Past Medical History:  Diagnosis Date   Anemia    Anxiety    Constipation    Depression    Fibromyalgia    GERD (gastroesophageal reflux disease)    Hemorrhoids    HSV-1 infection    Hypertension    Hypothyroidism    OSA on CPAP    Other abnormal glucose    Preeclampsia     Past Surgical History:  Procedure Laterality Date   CARPAL TUNNEL RELEASE Right 02/2020   CHOLECYSTECTOMY  2006   TONSILLECTOMY     WISDOM TOOTH EXTRACTION      Current Outpatient Medications  Medication Sig Dispense Refill   ALPRAZolam (XANAX) 0.5 MG tablet TAKE 1 TABLET BY MOUTH EVERY DAY AT BEDTIME AS NEEDED 30 tablet 0   amphetamine-dextroamphetamine ([name]) 10 MG tablet 1 tablet at lunch time if needed for afternoon fatigue 30 tablet  0   ARIPiprazole ([name]) 10 MG tablet Take one half tablet by mouth daily, 5 mg daily 30 tablet 2   Cholecalciferol (VITAMIN D3) 125 MCG (5000 UT) CAPS Take 1 capsule by mouth daily.     Iron-Vitamin C 65-125 MG TABS Take by mouth daily.     L-Methylfolate-Algae (DEPLIN 15 PO) Take by mouth.     pantoprazole (PROTONIX) 40 MG tablet TAKE 1 TABLET BY MOUTH EVERY DAY 30 tablet 1   [name] 150 MG TABS TAKE 150 MG BY MOUTH EVERY MORNING. 30 tablet 5   SYNTHROID 150 MCG tablet TAKE 1 TABLET BY MOUTH EVERY DAY BEFORE BREAKFAST 30 tablet 5   vitamin E 100 UNIT capsule Take by mouth daily. Takes 111m     vortioxetine HBr ([name]) 20 MG TABS tablet Take 20 mg by mouth daily.     rosuvastatin (CRESTOR) 5 MG tablet Take 1 tablet (5 mg total) by mouth at bedtime. 90 tablet 3   No current facility-administered medications for this visit.    Allergies as of 08/12/2020 - Review Complete 08/12/2020  Allergen Reaction Noted   Lamictal [lamotrigine] Rash 02/11/2016   Doxycycline Other (See Comments) 09/19/2013   Effexor [venlafaxine] Other (See Comments) 09/19/2013   Savella [milnacipran hcl] Other (See Comments) 09/19/2013   Prilosec [omeprazole] Itching 11/21/2019    Vitals: BP 115/69    Pulse 77    Ht _0  (1.575 m)    Wt 189 lb 8 oz (86 kg)    BMI 34.66 kg/m  Last Weight:  Wt Readings from Last 1 Encounters:  08/12/20 189 lb 8 oz (86 kg)   BQAS:TMHDmass index is 34.66 kg/m.     Last Height:   Ht Readings from Last 1 Encounters:  08/12/20 _1  (1.575 m)    Physical exam:  General: The patient is awake, alert and appears not in acute distress. The patient is well groomed. Head: Normocephalic, atraumatic. Neck is supple. Mallampati 5 and pale mucosa. ,  neck circumference: 15,5 " Nasal airflow patent , Retrognathia is not seen. Wore braces and retainer, now bruxism mouth guard.  Cardiovascular:  Regular rate and rhythm, without  murmurs or carotid bruit, and without  distended neck veins. Respiratory: Lungs are clear to auscultation. Skin:  Without evidence of edema, or rash Trunk: BMI is 37 .2 Neurologic exam : The patient is awake and alert, oriented to place and time.     Attention span & concentration ability appears normal.  Speech is slowed but  fluent, meek-  without dysarthria, dysphonia or aphasia.  Mood and affect are appropriate.  Cranial nerves: Intact sense of taste and smell. Pupils are equal and briskly reactive to light.  Facial motor strength is symmetric and tongue and uvula move midline. Shoulder shrug was symmetrical.  Motor exam:  Normal tone, muscle bulk and symmetric strength in all extremities. Sensory:  Deferred.  Coordination: Rapid alternating movements in the fingers/hands was normal. Finger-to-nose maneuver  normal without evidence of ataxia, dysmetria or tremor. Gait and station: Patient walks without assistive device. Turns with 3 Steps.  Deep tendon reflexes: in the  upper and lower extremities are  intact.   Assessment:  After physical and neurologic examination, review of laboratory studies,  Personal review of imaging studies, reports of other /same  Imaging studies, results of polysomnography and / or neurophysiology testing and pre-existing records as far as provided in visit., my assessment is ;   1) UARS snoring, mild OSA -Mrs. Swafford has noted more sleep continuity on CPAP.  Her residal AHI  is excellent . Her sleepiness didn't change . Her HLA test was bi-allelic positive for 2 alleles .  MSLT had no REM sleep onsets in 5 naps.   Ideopathic Hypersomnia? .    I spent more than 25  minutes of face to face time and preparation time with the patient.  Greater than 50% of time was spent in counseling and coordination of care. We have discussed the diagnosis and differential and I answered the patient's questions.    Plan:  Treatment plan and additional workup :  Sunosis to continue and add adderall 10 mg XR.    Telephone RV in 4-6 weeks for check up and possible change to XYWAV . As discussed, [name]/ Donney Rankins has to be taken with very mindful caution: Taking [name]/ Xywav  correctly is key. This means, take it only when you are fully ready to fall asleep, while in bed and refrain from doing any other activities, even brushing  your teeth after taking your first dose. The second dose will be about 2-1/2-4 hours after his first dose.  You can go to the bathroom before your 2nd dose. Take your first dose, when actually IN BED, ready to sleep.  No sitting up in bed, NO reading, NO using the cell phone or computer, NO getting up to use the bathroom. Take care of everything BEFORE sleep time. Try NOT to skip the second dose as the [name]/ Donney Rankins is not going to stay in your system long enough with only one dose. Do not drink alcohol with [name]/ Donney Rankins . If you do drink Alcohol, you cannot take your [name]/ Xywav doses that night.    Larey Seat, MD 0/96/0454, 0:98 AM  Certified in Neurology by ABPN Certified in [place] by [facility] [address], [place], [place] [address]

## 2020-08-12 NOTE — Patient Instructions (Signed)
As discussed, [name]/ Trudee Kuster has to be taken with very mindful caution: Taking [name]/ Xywav  correctly is key. This means, take it only when you are fully ready to fall asleep, while in bed and refrain from doing any other activities, even brushing  your teeth after taking your first dose. The second dose will be about 2-1/2-4 hours after his first dose.  You can go to the bathroom before your 2nd dose. Take your first dose, when actually IN BED, ready to sleep.  No sitting up in bed, NO reading, NO using the cell phone or computer, NO getting up to use the bathroom. Take care of everything BEFORE sleep time. Try NOT to skip the second dose as the [name]/ Trudee Kuster is not going to stay in your system long enough with only one dose. Do not drink alcohol with [name]/ Trudee Kuster . If you do drink Alcohol, you cannot take your [name]/ Xywav doses that night.    XYWAV Calcium, Magnesium, Potassium, Sodium Oxybates oral solution What is this medicine? CALCIUM, MAGNESIUM, POTASSIUM, SODIUM OXYBATES (KAL see um, mag NEE zee um, poe TASS i um, SOE dee um OX i bates) is used to treat excessive sleepiness and cataplexy in patients with narcolepsy. Cataplexy causes a sudden muscle weakness due to a strong emotional response. This medicine may be used for other purposes; ask your health care provider or pharmacist if you have questions. COMMON BRAND NAME(S): Trudee Kuster What should I tell my health care provider before I take this medicine? They need to know if you have any of these conditions:  depression  history of drug or alcohol abuse problem  if you drink alcohol  liver disease  lung or breathing disease, like sleep apnea  mental illness  succinic semialdehyde dehydrogenase deficiency  suicidal thoughts, plans, or attempt; a previous suicide attempt by you or a family member  an unusual or allergic reaction to oxybate, other medicines, foods, dyes, or preservatives  pregnant or trying to get  pregnant  breast-feeding How should I use this medicine? Take this medicine by mouth. Follow the directions on the prescription label. Use a specially marked oral syringe, spoon, or dropper to measure each dose. Ask your pharmacist if you do not have one. Household spoons are not accurate. Mix the dose with water as directed. Take this medicine on an empty stomach, or at least 2 hours after food. Take your medicine at regular intervals. Do not take it more often than directed. Do not stop taking except on your doctor's advice. A special MedGuide will be given to you by the pharmacist with each prescription and refill. Be sure to read this information carefully each time. Talk to your pediatrician regarding the use of this medicine in children. While this drug may be prescribed for children as young as 7 years for selected conditions, precautions do apply. Overdosage: If you think you have taken too much of this medicine contact a poison control center or emergency room at once. NOTE: This medicine is only for you. Do not share this medicine with others. What if I miss a dose? If you miss a dose, skip it. Take your next dose at the normal time. Do not take extra or 2 doses at the same time to make up for the missed dose. What may interact with this medicine? Do not take this medicine with any of the following medications:  alcohol  medicines for sleep This medicine may also interact with the following medications:  antihistamines for allergy,  cough, and cold  certain medicines for depression, like amitriptyline, fluoxetine, sertraline  certain medicines for seizures like phenobarbital, primidone  divalproex sodium  general anesthetics like halothane, isoflurane, methoxyflurane, propofol  medicines that relax muscles for surgery  narcotic medicines for pain  phenothiazines like chlorpromazine, mesoridazine, prochlorperazine, thioridazine  valproate or valproic acid This list may not  describe all possible interactions. Give your health care provider a list of all the medicines, herbs, non-prescription drugs, or dietary supplements you use. Also tell them if you smoke, drink alcohol, or use illegal drugs. Some items may interact with your medicine. What should I watch for while using this medicine? Visit your doctor or health care professional for regular checks on your progress. Tell your healthcare professional if your symptoms do not start to get better or if they get worse. This medicine has a risk of abuse and dependence. Your health care provider will check you for this while you take this medicine. You may get drowsy or dizzy. This medicine causes sleep very quickly. You should only take your first dose at bedtime, while in bed. The second dose should be taken 2.5 to 4 hours after your first dose. Do not drive, use machinery, or do anything that needs mental alertness for at least 6 hours after taking this drug. Do not stand up or sit up quickly, especially if you are an older patient. This reduces the risk of dizzy or fainting spells. Alcohol may interfere with the effect of this medicine. Avoid alcoholic drinks. After taking this medicine, you may get up out of bed and do an activity that you do not know you are doing. The next morning, you may have no memory of this. Activities include driving a car ("sleep-driving"), making and eating food, talking on the phone, sexual activity, and sleep-walking. Serious injuries have occurred. Call your doctor right away if you find out you have done any of these activities. Do not take this medicine if you have used alcohol that evening. Do not take it if you have taken another medicine for sleep. The risk of doing these sleep-related activities is higher. If you or your family notice any changes in your behavior, such as new or worsening depression, thoughts of harming yourself, anxiety, other unusual or disturbing thoughts, or memory loss,  call your healthcare professional right away. What side effects may I notice from receiving this medicine? Side effects that you should report to your doctor or health care professional as soon as possible:  allergic reactions like skin rash, itching or hives, swelling of the face, lips, or tongue  anxiety  breathing problems  confusion  hallucinations  seizures  signs and symptoms of low blood pressure like dizziness; feeling faint or lightheaded, falls; unusually weak or tired  sleepwalking  suicidal thoughts, mood changes  vomiting Side effects that usually do not require medical attention (report these to your doctor or health care professional if they continue or are bothersome):  bedwetting  dizziness  drowsiness  headache  loss of appetite  nausea This list may not describe all possible side effects. Call your doctor for medical advice about side effects. You may report side effects to FDA at 1-800-FDA-1088. Where should I keep my medicine? Keep out of the reach of children. This medicine can be abused. Keep your medicine in a safe place to protect it from theft. Do not share this medicine with anyone. Selling or giving away this medicine is dangerous and against the law. Store at room  temperature between 15 and 30 degrees C (59 and 86 degrees F). Keep this medicine in the original container. Throw away any unused medicine after the expiration date. This medicine may cause accidental overdose and death if it is taken by other adults, children, or pets. Flush any unused medicine down the toilet or empty down the sink drain to reduce the chance of harm. Do not use the medicine after the expiration date. After preparing a dose of this medicine with water, the medicine should be taken within 24 hours or emptied down the sink drain. NOTE: This sheet is a summary. It may not cover all possible information. If you have questions about this medicine, talk to your doctor,  pharmacist, or health care provider.  2020 Elsevier/Gold Standard (2019-06-22 15:40:37)

## 2020-08-17 ENCOUNTER — Encounter: Payer: Self-pay | Admitting: Physician Assistant

## 2020-08-17 ENCOUNTER — Ambulatory Visit (INDEPENDENT_AMBULATORY_CARE_PROVIDER_SITE_OTHER): Payer: BC Managed Care – PPO | Admitting: Physician Assistant

## 2020-08-17 ENCOUNTER — Other Ambulatory Visit: Payer: Self-pay

## 2020-08-17 VITALS — BP 128/70 | HR 85 | Temp 97.4°F | Wt 194.0 lb

## 2020-08-17 DIAGNOSIS — Z1152 Encounter for screening for COVID-19: Secondary | ICD-10-CM | POA: Diagnosis not present

## 2020-08-17 DIAGNOSIS — R1013 Epigastric pain: Secondary | ICD-10-CM

## 2020-08-17 DIAGNOSIS — E611 Iron deficiency: Secondary | ICD-10-CM

## 2020-08-17 DIAGNOSIS — R110 Nausea: Secondary | ICD-10-CM | POA: Diagnosis not present

## 2020-08-17 LAB — CBC WITH DIFFERENTIAL/PLATELET
Absolute Monocytes: 605 cells/uL (ref 200–950)
Basophils Absolute: 22 cells/uL (ref 0–200)
Basophils Relative: 0.2 %
Eosinophils Absolute: 56 cells/uL (ref 15–500)
Eosinophils Relative: 0.5 %
HCT: 41.3 % (ref 35.0–45.0)
Hemoglobin: 13.7 g/dL (ref 11.7–15.5)
Lymphs Abs: 3304 cells/uL (ref 850–3900)
MCH: 29.2 pg (ref 27.0–33.0)
MCHC: 33.2 g/dL (ref 32.0–36.0)
MCV: 88.1 fL (ref 80.0–100.0)
MPV: 11.8 fL (ref 7.5–12.5)
Monocytes Relative: 5.4 %
Neutro Abs: 7213 cells/uL (ref 1500–7800)
Neutrophils Relative %: 64.4 %
Platelets: 171 10*3/uL (ref 140–400)
RBC: 4.69 10*6/uL (ref 3.80–5.10)
RDW: 12.6 % (ref 11.0–15.0)
Total Lymphocyte: 29.5 %
WBC: 11.2 10*3/uL — ABNORMAL HIGH (ref 3.8–10.8)

## 2020-08-17 LAB — POC COVID19 BINAXNOW: SARS Coronavirus 2 Ag: NEGATIVE

## 2020-08-17 LAB — COMPLETE METABOLIC PANEL WITH GFR
AG Ratio: 1.8 (calc) (ref 1.0–2.5)
ALT: 13 U/L (ref 6–29)
AST: 11 U/L (ref 10–30)
Albumin: 4.5 g/dL (ref 3.6–5.1)
Alkaline phosphatase (APISO): 56 U/L (ref 31–125)
BUN: 16 mg/dL (ref 7–25)
CO2: 27 mmol/L (ref 20–32)
Calcium: 9.3 mg/dL (ref 8.6–10.2)
Chloride: 102 mmol/L (ref 98–110)
Creat: 0.7 mg/dL (ref 0.50–1.10)
GFR, Est African American: 125 mL/min/{1.73_m2} (ref >=60)
GFR, Est Non African American: 108 mL/min/{1.73_m2} (ref >=60)
Globulin: 2.5 g/dL (calc) (ref 1.9–3.7)
Glucose, Bld: 168 mg/dL — ABNORMAL HIGH (ref 65–99)
Potassium: 4.2 mmol/L (ref 3.5–5.3)
Sodium: 137 mmol/L (ref 135–146)
Total Bilirubin: 0.3 mg/dL (ref 0.2–1.2)
Total Protein: 7 g/dL (ref 6.1–8.1)

## 2020-08-17 LAB — IRON, TOTAL/TOTAL IRON BINDING CAP
%SAT: 8 % (calc) — ABNORMAL LOW (ref 16–45)
Iron: 33 ug/dL — ABNORMAL LOW (ref 40–190)
TIBC: 392 mcg/dL (calc) (ref 250–450)

## 2020-08-17 LAB — LIPASE: Lipase: 53 U/L (ref 7–60)

## 2020-08-17 LAB — AMYLASE: Amylase: 47 U/L (ref 21–101)

## 2020-08-17 NOTE — Progress Notes (Signed)
Subjective:    Patient ID: Lauren [name], female    DOB: 1981/04/04, 39 y.o.   MRN: [id number]  HPI 39 y.o. WF with history of hx of fibromyalgia, sleep disorder/apnea, narcolepsy (followed by Dr. Vickey Huger), chronic fatigue, hypothyroidpresents with worsening fatigue x 1 week and stomach ache/gas x Saturday. Vaccinated. 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   She states she has been having teeth pain with chewing, chest tightness, pain with deep breath and tenderness with pushing on her chest. She has had stomach discomfort and feels "tight with standing". She has felt fevers off and on today per patient. 99.4 highest my mouth.   She has been in bed all weekend, more tired than usual.  She has had nausea but no vomiting. Did have GERD yesterday. No diarrhea, no constipation, has had BM daily without straining, no coughing, no wheezing, no SOB. Some mild sore throat.   She is on protonix as needed.   Colonoscopy 11/2017 with Dr. Caryl Never showed diverticulosis.   She had CT AB 07/20218 IMPRESSION: 1. No acute process in the abdomen or pelvis. No explanation for patient's symptoms. Normal appendix. 2. Hepatic steatosis and hepatomegaly. 11 mm focus of relative hyper attenuation in the left liver lobe is indeterminate. Although this could represent an area of fat sparing, it is not readily apparent back in 2012. as ultrasound characterization would likely not be possible secondary to the extent of steatosis, consider further evaluation with nonemergent outpatient pre and post contrast abdominal MRI. 3. Esophageal air fluid level suggests dysmotility or gastroesophageal reflux.  MRI AB 04/2020 IMPRESSION: 1. Decreased size of 1.4 cm hypervascular lesion in segment 3 of the left lobe, consistent with benign etiology such as focal nodular hyperplasia or hepatic adenoma. 2. Stable diffuse hepatic steatosis.  Lab  Results  Component Value Date   IRON 47 04/15/2020   TIBC 373 04/15/2020   FERRITIN 29 04/15/2020   Lab Results  Component Value Date   VITAMINB12 582 04/15/2020    Blood pressure 128/70, pulse 85, temperature (!) 97.4 F (36.3 C), weight 194 lb (88 kg), SpO2 98 %.  Medications  Current Outpatient Medications (Endocrine & Metabolic):  .  SYNTHROID 150 MCG tablet, TAKE 1 TABLET BY MOUTH EVERY DAY BEFORE BREAKFAST  Current Outpatient Medications (Cardiovascular):  .  rosuvastatin (CRESTOR) 5 MG tablet, Take    1 tablet    Daily     for Cholesterol    Current Outpatient Medications (Hematological):  Marland Kitchen  Iron-Vitamin C 65-125 MG TABS, Take by mouth daily.  Current Outpatient Medications (Other):  Marland Kitchen  ALPRAZolam (XANAX) 0.5 MG tablet, TAKE 1 TABLET BY MOUTH EVERY DAY AT BEDTIME AS NEEDED .  amphetamine-dextroamphetamine ([name]) 10 MG 24 hr capsule, Take 1 capsule (10 mg total) by mouth daily. .  ARIPiprazole ([name]) 10 MG tablet, Take one half tablet by mouth daily, 5 mg daily .  Cholecalciferol (VITAMIN D3) 125 MCG (5000 UT) CAPS, Take 1 capsule by mouth daily. Marland Kitchen  L-Methylfolate-Algae (DEPLIN 15 PO), Take by mouth. .  pantoprazole (PROTONIX) 40 MG tablet, TAKE 1 TABLET BY MOUTH EVERY DAY .  Solriamfetol HCl ([name]) 150 MG TABS, Take 150 mg by mouth every morning. .  vitamin E 100 UNIT capsule, Take by mouth daily. Takes 180mg  .  vortioxetine HBr ([name]) 20 MG TABS tablet, Take 20 mg by mouth daily.  Problem list She has Hypothyroidism; Paralysis,  periodic secondary; Depression; Hypertension; Abnormal glucose; Hyperlipidemia LDL goal <100; Morbid obesity (HCC); Medication management; Paraparesis (HCC); Headache associated with sexual activity; Dizziness; Hepatomegaly; Hepatic steatosis; Recurrent isolated sleep paralysis; Hypersomnia with sleep apnea; Obstructive sleep apnea treated with continuous positive airway pressure (CPAP); Fibromyalgia; Liver lesion, left lobe;  Narcolepsy; Carpal tunnel syndrome, bilateral; Major depressive disorder, recurrent episode, moderate (HCC); Menorrhagia with irregular cycle; Urine pregnancy test negative; Uterine enlargement; Trigger thumb, left thumb; Pain in right wrist; Mild obstructive sleep apnea; Hypersomnia, persistent; Trigger thumb, right thumb; and Narcolepsy due to underlying condition without cataplexy on their problem list.   Review of Systems  Constitutional: Positive for fatigue. Negative for chills and fever.  HENT: Positive for congestion, rhinorrhea and sore throat. Negative for dental problem, ear discharge, ear pain, nosebleeds, trouble swallowing and voice change.   Respiratory: Positive for chest tightness. Negative for cough, shortness of breath and wheezing.   Cardiovascular: Positive for chest pain. Negative for palpitations and leg swelling.  Gastrointestinal: Positive for abdominal pain and nausea. Negative for abdominal distention, anal bleeding, blood in stool, constipation, diarrhea, rectal pain and vomiting.  Genitourinary: Negative.   Musculoskeletal: Negative.   Neurological: Negative.        Objective:   Physical Exam General Appearance: Well nourished, in no apparent distress. Eyes: PERRLA, EOMs, conjunctiva no swelling or erythema Sinuses: No Frontal/maxillary tenderness ENT/Mouth: R Ext aud canals clear, TM without erythema, bulging. L ext aud canal inflamed/narrowed, mildly erythematous, tragus tenderness, no mastoid bogginess, no discharge. No erythema, swelling, or exudate on post pharynx.  Tonsils not swollen or erythematous. Hearing normal.  Neck: Supple, thyroid normal.  Respiratory: Respiratory effort normal, BS equal bilaterally without rales, rhonchi, wheezing or stridor.  Cardio: RRR with no MRGs. Brisk peripheral pulses without edema.  Abdomen: Soft, + BS.  + epigastric tenderness, no guarding, no rebound, hernias, masses. Lymphatics: Non tender without lymphadenopathy.   Musculoskeletal: Full ROM, 5/5 strength, Normal gait Skin: Warm, dry without rashes, lesions, ecchymosis.  Neuro: Cranial nerves intact. No cerebellar symptoms.  Psych: Awake and oriented X 3, normal affect, Insight and Judgment appropriate.        Assessment & Plan:  Epigastric pain  DDX GERD, ulcer , pancreatitis, viral gastroenteritis- COVID negative Likely more viral in nature with some HA, sinus issues  check labs, bland diet, small portions, increase H20 Start on her PPI daily Patient advised to go to the ER if the symptoms increase or worsen.    Anemia Check labs Has been on iron daily with vitamin C Still with fatigue No blood in stool ? Need EGD or iron transfusion for symptoms in the mean time

## 2020-08-18 ENCOUNTER — Telehealth: Payer: Self-pay | Admitting: Nurse Practitioner

## 2020-08-18 DIAGNOSIS — E611 Iron deficiency: Secondary | ICD-10-CM

## 2020-08-18 NOTE — Telephone Encounter (Signed)
Patient with ongoing iron def and fatigue, on oral iron with vitamin D, on protonix,  Following with Dr. Daphine Deutscher at [facility].  Had colonoscopy 11/22/2017, due 11/2022.  She had MRCP 12/2017, had MRI AB 04/2020 normal.   Going to send hemoccult card, will need to follow up with GI for EGD.   However, since patient has been on oral supplements and still having symptomatic iron def, will refer to hematology.

## 2020-08-18 NOTE — Telephone Encounter (Signed)
Received a new hem referral from Quentin Mulling, [place] for IDA. Ms. Troyer has been cld and scheduled to see Lacie on 10/4 at 145pm. Pt aware to arrive 20 minutes early.

## 2020-08-19 NOTE — Telephone Encounter (Signed)
Has been sent.

## 2020-08-24 ENCOUNTER — Other Ambulatory Visit: Payer: Self-pay

## 2020-08-24 ENCOUNTER — Inpatient Hospital Stay: Payer: BC Managed Care – PPO | Attending: Nurse Practitioner | Admitting: Nurse Practitioner

## 2020-08-24 VITALS — BP 131/77 | HR 80 | Temp 98.3°F | Resp 18 | Ht 62.0 in | Wt 195.3 lb

## 2020-08-24 DIAGNOSIS — G47419 Narcolepsy without cataplexy: Secondary | ICD-10-CM | POA: Insufficient documentation

## 2020-08-24 DIAGNOSIS — M797 Fibromyalgia: Secondary | ICD-10-CM | POA: Insufficient documentation

## 2020-08-24 DIAGNOSIS — E611 Iron deficiency: Secondary | ICD-10-CM

## 2020-08-24 DIAGNOSIS — I10 Essential (primary) hypertension: Secondary | ICD-10-CM | POA: Diagnosis not present

## 2020-08-24 DIAGNOSIS — F329 Major depressive disorder, single episode, unspecified: Secondary | ICD-10-CM | POA: Diagnosis not present

## 2020-08-24 DIAGNOSIS — Z808 Family history of malignant neoplasm of other organs or systems: Secondary | ICD-10-CM | POA: Diagnosis not present

## 2020-08-24 DIAGNOSIS — K760 Fatty (change of) liver, not elsewhere classified: Secondary | ICD-10-CM | POA: Insufficient documentation

## 2020-08-24 DIAGNOSIS — G4733 Obstructive sleep apnea (adult) (pediatric): Secondary | ICD-10-CM | POA: Diagnosis not present

## 2020-08-24 DIAGNOSIS — N920 Excessive and frequent menstruation with regular cycle: Secondary | ICD-10-CM | POA: Diagnosis not present

## 2020-08-24 DIAGNOSIS — E039 Hypothyroidism, unspecified: Secondary | ICD-10-CM | POA: Insufficient documentation

## 2020-08-24 DIAGNOSIS — D500 Iron deficiency anemia secondary to blood loss (chronic): Secondary | ICD-10-CM

## 2020-08-24 DIAGNOSIS — R5383 Other fatigue: Secondary | ICD-10-CM

## 2020-08-24 NOTE — Patient Instructions (Signed)

## 2020-08-24 NOTE — Progress Notes (Addendum)
[facility]  [phone] [phone]  Clinic New consult Note   Patient Care Team: Dohmeier, Porfirio Mylar, MD as PCP - General (Neurology) Fredrich Birks, OD as Referring Physician (Optometry) Sherian Rein, MD as Consulting Physician (Obstetrics and Gynecology) Dairl Ponder, MD as Consulting Physician (Orthopedic Surgery) Elvina Sidle, MD (Neurology) Venancio Poisson, MD as Consulting Physician (Dermatology) Pollyann Savoy, MD as Consulting Physician (Rheumatology) Cleotis Nipper, MD as Consulting Physician (Psychiatry) Valeria Batman, MD as Consulting Physician (Orthopedic Surgery) Date of Service: 08/24/2020   CHIEF COMPLAINTS/PURPOSE OF CONSULTATION:  Iron deficiency; referred by PCP Quentin Mulling, PA-C  HISTORY OF PRESENTING ILLNESS:  Lauren [name] 39 y.o. female with history of HTN, HL, OSA, hepatic steatosis, hypothyroidism, fibromyalgia, depression, and narcolepsy-like syndrome is here because of iron deficiency. She was found to have abnormal CBC from at least 04/2009 with a mild anemia Hg 11.8 and thrombocytopenia plt 126K. She had a normal CBC from 2013 - 2018. She was first found to have mild iron deficiency with low serum iron 32 and transferrin saturation 9% in 01/28/2015 that normalized in 06/02/2015, ferritin in 20's. She had autoimmune work up in 04/2016 showing normal ANA, ds DNA Ab, and sed rate. She had elevated copper level in 08/01/16 up to 400 mcg/L. She had a TSH of 0.310 in 09/2015 on synthroid that has fluctuated up to 7.09 over the years. She underwent a colonoscopy on 11/22/2017 by [facility], showing diverticulosis of the whole colon, internal hemorrhoids, 2 scars from prior polypectomies but otherwise normal. A repeat colonoscopy was recommended in 5 years. CBC, Iron, B12, and folate were normal in 12/21/17 until serum iron and saturation dropped again in 03/2019 - 10/2019  despite a normal ferritin of 85 and normal Hg. Iron improved in 01/2020 but was found to be low again on 08/17/20. She has been on oral iron for years, recently started taking with vit C. Due to abdominal symptoms she underwent MR abdominal on 05/16/20 that showed a probable benign hypervascular lesion in the left hepatic lobe and hepatic steatosis, otherwise negative.   Socially, she is married with 2 healthy daughters.  She works as a Runner, broadcasting/film/video.  She is independent with her ADLs, ambulates at work but is otherwise sedentary.  She denies alcohol, tobacco, or other drug use.  She denies known family history of anemia or blood disorders.  Her mother had cervical cancer.  She is up-to-date on her mammogram, Pap, and colonoscopies last done in 11/2017 per GI at [facility]  She is scheduled for an EGD on Monday 10/11 to evaluate epigastric pain.  Today, she presents by herself.  She is fatigued, remains functional but with effort.  She has mild DOE, no cough, chest pain.  She occasionally feels like she might pass out.  Last week she had an episode of low-grade fever with mild chills that resolved.  Denies obvious bleeding except heavy menstrual bleeding for 2 of her 5-day cycle that occurs monthly.  Denies history of bariatric surgery.  Celiac panel was negative on 03/2020.  He takes pantoprazole initially as needed but has recently started taking twice daily.  She takes oral iron with vitamin C once daily for many years, tolerates well.  Denies history of IV iron infusion.  He has never received but has donated blood in the past.  She does not take NSAIDs or aspirin, denies pica.   MEDICAL HISTORY:  Past Medical History:  Diagnosis Date  . Anemia   .  Anxiety   . Constipation   . Depression   . Fibromyalgia   . GERD (gastroesophageal reflux disease)   . Hemorrhoids   . HSV-1 infection   . Hypertension   . Hypothyroidism   . OSA on CPAP   . Other abnormal glucose   . Preeclampsia     SURGICAL  HISTORY: Past Surgical History:  Procedure Laterality Date  . CARPAL TUNNEL RELEASE Right 02/2020  . CHOLECYSTECTOMY  2006  . TONSILLECTOMY    . WISDOM TOOTH EXTRACTION      SOCIAL HISTORY: Social History   Socioeconomic History  . Marital status: Married    Spouse name: Not on file  . Number of children: 2  . Years of education: Not on file  . Highest education level: Not on file  Occupational History  . Occupation: Magazine features editor: [employer]  Tobacco Use  . Smoking status: Never Smoker  . Smokeless tobacco: Never Used  Vaping Use  . Vaping Use: Never used  Substance and Sexual Activity  . Alcohol use: Yes    Alcohol/week: 0.0 standard drinks    Comment: occ  . Drug use: No  . Sexual activity: Yes    Partners: Male    Birth control/protection: None, Surgical    Comment: husband has had a vasectomy  Other Topics Concern  . Not on file  Social History Narrative   4 caffeine drinks daily    Social Determinants of Health   Financial Resource Strain:   . Difficulty of Paying Living Expenses: Not on file  Food Insecurity:   . Worried About Programme researcher, broadcasting/film/video in the Last Year: Not on file  . Ran Out of Food in the Last Year: Not on file  Transportation Needs:   . Lack of Transportation (Medical): Not on file  . Lack of Transportation (Non-Medical): Not on file  Physical Activity:   . Days of Exercise per Week: Not on file  . Minutes of Exercise per Session: Not on file  Stress:   . Feeling of Stress : Not on file  Social Connections:   . Frequency of Communication with Friends and Family: Not on file  . Frequency of Social Gatherings with Friends and Family: Not on file  . Attends Religious Services: Not on file  . Active Member of Clubs or Organizations: Not on file  . Attends Banker Meetings: Not on file  . Marital Status: Not on file  Intimate Partner Violence:   . Fear of Current or Ex-Partner: Not on file  . Emotionally  Abused: Not on file  . Physically Abused: Not on file  . Sexually Abused: Not on file    FAMILY HISTORY: Family History  Problem Relation Age of Onset  . Hypothyroidism Mother   . Cervical cancer Mother   . Bipolar disorder Mother   . Diabetes Mother   . Colon polyps Mother   . Bipolar disorder Brother   . Kidney disease Brother   . Alcohol abuse Brother   . Hypothyroidism Daughter   . Heart disease Maternal Grandmother   . Diabetes Maternal Grandmother   . Cancer Maternal Grandmother        SKIN  . Hyperlipidemia Maternal Grandmother   . Hypertension Maternal Grandmother   . Hypothyroidism Maternal Grandmother   . Lung cancer Maternal Grandfather   . Heart disease Maternal Aunt   . Diabetes Maternal Aunt   . Diabetes Cousin   . Colon cancer Neg  Hx     ALLERGIES:  is allergic to lamictal [lamotrigine], doxycycline, effexor [venlafaxine], savella [milnacipran hcl], and prilosec [omeprazole].  MEDICATIONS:  Current Outpatient Medications  Medication Sig Dispense Refill  . ALPRAZolam (XANAX) 0.5 MG tablet TAKE 1 TABLET BY MOUTH EVERY DAY AT BEDTIME AS NEEDED 30 tablet 0  . amphetamine-dextroamphetamine ([name]) 10 MG 24 hr capsule Take 1 capsule (10 mg total) by mouth daily. 30 capsule 0  . ARIPiprazole ([name]) 10 MG tablet Take one half tablet by mouth daily, 5 mg daily 30 tablet 2  . Cholecalciferol (VITAMIN D3) 125 MCG (5000 UT) CAPS Take 1 capsule by mouth daily.    . Iron-Vitamin C 65-125 MG TABS Take by mouth daily.    Marland Kitchen L-Methylfolate-Algae (DEPLIN 15 PO) Take by mouth.    . pantoprazole (PROTONIX) 40 MG tablet TAKE 1 TABLET BY MOUTH EVERY DAY 30 tablet 1  . rosuvastatin (CRESTOR) 5 MG tablet Take    1 tablet    Daily     for Cholesterol 90 tablet 0  . Solriamfetol HCl ([name]) 150 MG TABS Take 150 mg by mouth every morning. 30 tablet 5  . SYNTHROID 150 MCG tablet TAKE 1 TABLET BY MOUTH EVERY DAY BEFORE BREAKFAST 30 tablet 5  . vitamin E 100 UNIT capsule Take  by mouth daily. Takes 180mg     . vortioxetine HBr ([name]) 20 MG TABS tablet Take 20 mg by mouth daily.     No current facility-administered medications for this visit.    REVIEW OF SYSTEMS:   Constitutional: Denies loss of appetite or unintentional weight loss (+) low-grade fever with chills x1 last week (+) fatigue Eyes: Denies blurriness of vision, double vision or watery eyes Ears, nose, mouth, throat, and face: Denies epistaxis, mucositis or sore throat Respiratory: Denies cough, dyspnea or wheezes (+) mild DOE Cardiovascular: Denies palpitation, chest discomfort or lower extremity swelling (+) pre-syncope Gastrointestinal:  Denies nausea, vomiting, constipation, diarrhea, or change in bowel habits, melena, or hematochezia (+) GERD (+) epigastric pain GU: Denies hematuria Skin: Denies abnormal skin rashes Lymphatics: Denies new lymphadenopathy or easy bruising Neurological:Denies numbness, tingling or new weaknesses (+) fibromyalgia Behavioral/Psych: (+) Anxiety/depression (+) mood is stable All other systems were reviewed with the patient and are negative.  PHYSICAL EXAMINATION:  Vitals:   08/24/20 1332  BP: 131/77  Pulse: 80  Resp: 18  Temp: 98.3 F (36.8 C)  SpO2: 100%   Filed Weights   08/24/20 1332  Weight: 195 lb 4.8 oz (88.6 kg)    GENERAL:alert, no distress and comfortable SKIN: No rash to exposed skin EYES:  sclera clear NECK: Without mass LYMPH:  no palpable cervical or supraclavicular lymphadenopathy  LUNGS: clear with normal breathing effort HEART: regular rate & rhythm, no lower extremity edema ABDOMEN:abdomen soft, non-tender and normal bowel sounds PSYCH: alert & oriented x 3 with fluent speech NEURO: no focal motor/sensory deficits  LABORATORY DATA:  I have reviewed the data as listed CBC Latest Ref Rng & Units 08/17/2020 07/01/2020 04/15/2020  WBC 3.8 - 10.8 Thousand/uL 11.2(H) 11.0(H) 7.0  Hemoglobin 11.7 - 15.5 g/dL 04/17/2020 05.6 97.9   Hematocrit 35 - 45 % 41.3 41.8 40.4  Platelets 140 - 400 Thousand/uL 171 180 180    CMP Latest Ref Rng & Units 08/17/2020 07/01/2020 04/15/2020  Glucose 65 - 99 mg/dL 04/17/2020) 165(V) 86  BUN 7 - 25 mg/dL 16 19 11   Creatinine 0.50 - 1.10 mg/dL 374(M 2.70  Sodium 135 - 146 mmol/L 137  137 139  Potassium 3.5 - 5.3 mmol/L 4.2 4.5 4.3  Chloride 98 - 110 mmol/L 102 103 105  CO2 20 - 32 mmol/L 27 26 28   Calcium 8.6 - 10.2 mg/dL 9.3 9.9 9.2  Total Protein 6.1 - 8.1 g/dL 7.0 7.2 7.1  Total Bilirubin 0.2 - 1.2 mg/dL 0.3 0.4 0.5  Alkaline Phos 38 - 126 U/L - - -  AST 10 - 30 U/L 11 10 15   ALT 6 - 29 U/L 13 11 18      RADIOGRAPHIC STUDIES: I have personally reviewed the radiological images as listed and agreed with the findings in the report. No results found.  ASSESSMENT & PLAN: 39 yo female with fibromyalgia, depression, HTN, hepatic steatosis, hypothyroidism, narcolepsy-like syndrome, OSA, menorrhagia   1. Iron deficiency without anemia  -We reviewed her medical record in detail, labs show a low serum iron and transferrin saturation, normal TIBC and ferritin, no anemia  -This is likely related to menorrhagia  -She tolerates oral iron once daily.  She is on a PPI which we discussed can inhibit iron absorption.  We are recommending to increase oral iron to BID, take with vitamin C, and take at least 2 hours before or after PPI -She was given written material for iron rich diet -She can follow-up with labs per PCP.  We likely do not need to see her in the future unless her iron deficiency worsens or fails to respond to oral iron, she develops intolerances to oral iron, or she develops anemia  2. Fatigue -Moderate to severe at times, multifactorial -Given her mild iron deficiency without anemia, this is likely not the source of her fatigue -Likely related to her other co-morbidities, lack of physical exercise, etc.  3.  Comorbidities: Fibromyalgia, depression, HTN, hepatic steatosis,  hypothyroidism, narcolepsy, OSA -Continue follow-up with PCP, rheumatology, neuro, psych  Plan: -Labs reviewed -Increase oral iron 2-3 times daily, take with vitamin C, separate at least 2 hours from PPI -Increase iron rich diet -Follow-up with PCP and other routine care team providers -Follow-up open  All questions were answered. The patient knows to call the clinic with any problems, questions or concerns.     Pollyann SamplesLacie K Rubena Roseman, NP 08/25/20   Addendum  I have seen the patient, examined her. I agree with the assessment and and plan and have edited the notes.   Lauren [name] is a 39 yo female with PMH of HTN, HL, OSA, hepatic steatosis, hypothyroidism, fibromyalgia, depression, and narcolepsy-like syndrome was referred for iron deficiency.  She has no anemia.  I have reviewed her lab, she has low serum iron and saturation, normal TIBC, normal ferritin.  Her iron deficiency is probably related to her in a wheelchair. Her iron deficiency is mild, and I do not think her fatigue is related to her iron deficiency.  I do not recommend IV iron, but encouraged her to increase oral iron from once daily to 2 or 3 times a day.  Constipation management reviewed with her.  All questions were answered, we will see her back as needed in the future.   Malachy MoodYan Feng  08/24/2020

## 2020-08-25 ENCOUNTER — Encounter: Payer: Self-pay | Admitting: Nurse Practitioner

## 2020-08-26 ENCOUNTER — Telehealth: Payer: Self-pay | Admitting: Internal Medicine

## 2020-08-26 NOTE — Telephone Encounter (Signed)
No 10/4 los °

## 2020-08-27 ENCOUNTER — Other Ambulatory Visit: Payer: Self-pay

## 2020-08-27 ENCOUNTER — Ambulatory Visit (INDEPENDENT_AMBULATORY_CARE_PROVIDER_SITE_OTHER): Payer: BC Managed Care – PPO | Admitting: Internal Medicine

## 2020-08-27 VITALS — BP 106/78 | HR 64 | Temp 97.2°F | Resp 16 | Ht 62.0 in | Wt 194.4 lb

## 2020-08-27 DIAGNOSIS — R0789 Other chest pain: Secondary | ICD-10-CM

## 2020-08-27 MED ORDER — DEXAMETHASONE 4 MG PO TABS: ORAL_TABLET | ORAL | 0 refills | Status: DC

## 2020-08-27 NOTE — Progress Notes (Signed)
History of Present Illness:     Patient is avery nice 39 yo MWF who was seen just 4 days ago at [facility] by Dr Concepcion Elk for c/o reflux and she was switched from Omeprazole to Pantoprazole. She presents today with c/o pain & tenderness of the Left antero-lateral-posterior chest wall. Non-exertional.  No Chest congestion or dyspnea.  On 10/02  at [facility] on [facility] she was seen for left chest wall discomfort & was told that she had a "pulled muscle" . She's taken Aleve 2-3 day with some benefit. Denies fever, dyspnea, cough, congestion. Positional changes exacerbate the discomfort.   Medications  .  SYNTHROID 150 MCG tablet, TAKE 1 TABLET BY MOUTH EVERY DAY BEFORE BREAKFAST .  rosuvastatin (CRESTOR) 5 MG tablet, Take    1 tablet    Daily     for Cholesterol .  Iron-Vitamin C 65-125 MG TABS, Take by mouth daily. Marland Kitchen  ALPRAZolam (XANAX) 0.5 MG tablet, TAKE 1 TABLET BY MOUTH EVERY DAY AT BEDTIME AS NEEDED .  amphetamine-dextroamphetamine ([name]) 10 MG 24 hr capsule, Take 1 capsule (10 mg total) by mouth daily. .  ARIPiprazole ([name]) 10 MG tablet, Take one half tablet by mouth daily, 5 mg daily .  Cholecalciferol (VITAMIN D3) 125 MCG (5000 UT) CAPS, Take 1 capsule by mouth daily. Marland Kitchen  L-Methylfolate-Algae (DEPLIN 15 PO), Take by mouth. .  pantoprazole (PROTONIX) 40 MG tablet, TAKE 1 TABLET BY MOUTH EVERY DAY .  Solriamfetol HCl ([name]) 150 MG TABS, Take 150 mg by mouth every morning. .  vitamin E 100 UNIT capsule, Take by mouth daily. Takes 180mg  .  vortioxetine HBr ([name]) 20 MG TABS tablet, Take 20 mg by mouth daily.  Problem list She has Hypothyroidism; Paralysis, periodic secondary; Depression; Hypertension; Abnormal glucose; Hyperlipidemia LDL goal <100; Morbid obesity (HCC); Medication management; Paraparesis (HCC); Headache associated with sexual activity; Dizziness; Hepatomegaly; Hepatic steatosis; Recurrent isolated sleep paralysis;  Hypersomnia with sleep apnea; Obstructive sleep apnea treated with continuous positive airway pressure (CPAP); Fibromyalgia; Liver lesion, left lobe; Narcolepsy; Carpal tunnel syndrome, bilateral; Major depressive disorder, recurrent episode, moderate (HCC); Menorrhagia with irregular cycle; Urine pregnancy test negative; Uterine enlargement; Trigger thumb, left thumb; Pain in right wrist; Mild obstructive sleep apnea; Hypersomnia, persistent; Trigger thumb, right thumb; and Narcolepsy due to underlying condition without cataplexy on their problem list.   Observations/Objective:  BP 106/78   Pulse 64   Temp (!) 97.2 F (36.2 C)   Resp 16   Ht 5\' 2"  (1.575 m)   Wt 194 lb 6.4 oz (88.2 kg)   SpO2 99%   BMI 35.56 kg/m   HEENT - WNL. Neck - supple.  Chest - Clear equal BS. (+) tender posterolateral Left chest about the R4-R5 ribs extending anterior to about the mid clavicular line.  Cor - Nl HS. RRR w/o sig MGR. PP 1(+). No edema. MS- FROM w/o deformities.  Gait Nl. Neuro -  Nl w/o focal abnormalities. Skin - No rash.  Assessment and Plan:  1. Chest wall pain  - dexamethasone (DECADRON) 4 MG tablet; Take 1 tab 3 x day - 3 days, then 2 x day - 3 days, then 1 tab daily  Dispense: 20 tablet; Refill: 0       I discussed the assessment and treatment plan with the patient. The patient was provided an opportunity to ask questions and all were answered. The patient agreed with the plan and demonstrated an understanding of the  instructions.    Kirtland Bouchard, MD

## 2020-08-29 ENCOUNTER — Encounter: Payer: Self-pay | Admitting: Internal Medicine

## 2020-09-21 ENCOUNTER — Encounter: Payer: Self-pay | Admitting: Neurology

## 2020-09-22 ENCOUNTER — Telehealth: Payer: BC Managed Care – PPO | Admitting: Family Medicine

## 2020-10-04 ENCOUNTER — Other Ambulatory Visit: Payer: Self-pay | Admitting: Adult Health

## 2020-10-04 ENCOUNTER — Encounter: Payer: Self-pay | Admitting: Neurology

## 2020-10-05 ENCOUNTER — Other Ambulatory Visit: Payer: Self-pay | Admitting: Neurology

## 2020-10-05 MED ORDER — TRAZODONE HCL 50 MG PO TABS: 25.0000 mg | ORAL_TABLET | Freq: Every evening | ORAL | 0 refills | Status: DC | PRN

## 2020-10-07 ENCOUNTER — Ambulatory Visit (INDEPENDENT_AMBULATORY_CARE_PROVIDER_SITE_OTHER): Payer: BC Managed Care – PPO | Admitting: Adult Health Nurse Practitioner

## 2020-10-07 ENCOUNTER — Other Ambulatory Visit: Payer: Self-pay

## 2020-10-07 ENCOUNTER — Encounter: Payer: Self-pay | Admitting: Adult Health Nurse Practitioner

## 2020-10-07 VITALS — BP 128/88 | HR 65 | Temp 98.6°F | Wt 193.0 lb

## 2020-10-07 DIAGNOSIS — R591 Generalized enlarged lymph nodes: Secondary | ICD-10-CM

## 2020-10-07 DIAGNOSIS — D649 Anemia, unspecified: Secondary | ICD-10-CM | POA: Diagnosis not present

## 2020-10-07 DIAGNOSIS — N6459 Other signs and symptoms in breast: Secondary | ICD-10-CM

## 2020-10-07 NOTE — Progress Notes (Signed)
Assessment and Plan: Lauren [name] was seen today for acute visit and edema.  Diagnoses and all orders for this visit:  Abnormal breast exam Lymphadenopathy -     CBC with Differential/Platelet -     COMPLETE METABOLIC PANEL WITH GFR -     MM DIAG BREAST TOMO BILATERAL; Future -     US BREAST LTD UNI LEFT INC AXILLA; Future -     US BREAST LTD UNI RIGHT INC AXILLA; Future Rule out breast malignancy Will contact patient with lab results for further treatment plan  Anemia, unspecified type Taking supplementation -     Iron,Total/Total Iron Binding Cap   Contact office with any new or worsening symptoms.  Further disposition pending results if labs check today. Discussed med's effects and SE's.   Over 30 minutes of face to face interview, exam, counseling, chart review, and critical decision making was performed.    Future Appointments  Date Time Provider Department Center  10/29/2020  4:00 PM Lucky Cowboy, MD [facility] None  02/09/2021  3:30 PM Dohmeier, Porfirio Mylar, MD [facility] None    ------------------------------------------------------------------------------------------------------------------   HPI 39 y.o.female presents for evaluation of lump under left arm.  It first started back in August, subsequent s/p fall she was seen for chest wall pain in different area.  She did a course of dexamethasone and it resolved.  Over the course of the past couple months she has noticed the spot under her left armpit is becoming increasingly tender and getting worse every day.  Moving her arm or adduction increases the pain.  She reports when she wakes up in the morning it is the most sore This does not wake her form sleep.  Reports that she can tell it is there.  She reports low grade temps that are 99.7 at highest and intermittent. She has not taken anything for this.  She reports she has been feeling bad, tired but was relating this to her low iron.  She is taking supplementation for this.     Notable ,she had SARS-COV2 booster vaccination 09/11/20.  Had symptoms prior to this.    Past Medical History:  Diagnosis Date  . Anemia   . Anxiety   . Constipation   . Depression   . Fibromyalgia   . GERD (gastroesophageal reflux disease)   . Hemorrhoids   . HSV-1 infection   . Hypertension   . Hypothyroidism   . OSA on CPAP   . Other abnormal glucose   . Preeclampsia      Allergies  Allergen Reactions  . Lamictal [Lamotrigine] Rash  . Doxycycline Other (See Comments)    GI UPSET  . Effexor [Venlafaxine] Other (See Comments)    DYSPHORIA  . Savella [Milnacipran Hcl] Other (See Comments)    NO RELIEF  . Prilosec [Omeprazole] Itching    Current Outpatient Medications on File Prior to Visit  Medication Sig  . ALPRAZolam (XANAX) 0.5 MG tablet Take 1/2 - 1 tablet      At Bedtime      ONLY if needed for Sleep    &  limit to 5 days /week to avoid Addiction & Dementia  . amphetamine-dextroamphetamine ([name]) 10 MG 24 hr capsule Take 1 capsule (10 mg total) by mouth daily.  . ARIPiprazole ([name]) 10 MG tablet Take one half tablet by mouth daily, 5 mg daily  . Cholecalciferol (VITAMIN D3) 125 MCG (5000 UT) CAPS Take 1 capsule by mouth daily.  . Iron-Vitamin C 65-125 MG TABS Take by  mouth daily.  Marland Kitchen L-Methylfolate-Algae (DEPLIN 15 PO) Take by mouth.  . pantoprazole (PROTONIX) 40 MG tablet TAKE 1 TABLET BY MOUTH EVERY DAY  . rosuvastatin (CRESTOR) 5 MG tablet Take    1 tablet    Daily     for Cholesterol  . Solriamfetol HCl ([name]) 150 MG TABS Take 150 mg by mouth every morning.  Marland Kitchen SYNTHROID 150 MCG tablet TAKE 1 TABLET BY MOUTH EVERY DAY BEFORE BREAKFAST  . vitamin E 100 UNIT capsule Take by mouth daily. Takes 180mg   . vortioxetine HBr ([name]) 20 MG TABS tablet Take 20 mg by mouth daily.  . traZODone (DESYREL) 50 MG tablet Take 0.5 tablets (25 mg total) by mouth at bedtime as needed for sleep. (Patient not taking: Reported on 10/07/2020)   No current  facility-administered medications on file prior to visit.    ROS: all negative except above.   Physical Exam:  BP 128/88   Pulse 65   Temp 98.6 F (37 C)   Wt 193 lb (87.5 kg)   SpO2 98%   BMI 35.30 kg/m   General Appearance: Well nourished, in no apparent distress. Eyes: PERRLA, EOMs, conjunctiva no swelling or erythema Sinuses: No Frontal/maxillary tenderness ENT/Mouth: Ext aud canals clear, TMs without erythema, bulging. No erythema, swelling, or exudate on post pharynx.  Tonsils not swollen or erythematous. Hearing normal.  Neck: Supple, thyroid normal.  Respiratory: Respiratory effort normal, BS equal bilaterally without rales, rhonchi, wheezing or stridor.  Cardio: RRR with no MRGs. Brisk peripheral pulses without edema.  Abdomen: Soft, + BS.  Non tender, no guarding, rebound, hernias, masses. Breast Lymphatics: Bilateral lymphadenopathy axilla, right approx 1.5cm at center tapering x4cm, tender.  Left axilla approx 1cm x3cm.  Right breast tenderness at 2-6 O'clock and Left breast tenderness 9 O'clock. Non tender without lymphadenopathy all other.   Musculoskeletal: Full ROM, 5/5 strength, normal gait.  Skin: Warm, dry without rashes, lesions, ecchymosis.  Neuro: Cranial nerves intact. Normal muscle tone, no cerebellar symptoms. Sensation intact.  Psych: Awake and oriented X 3, normal affect, Insight and Judgment appropriate.      10/09/2020, Elder Negus, DNP [facility] 10/07/2020  2:34 PM

## 2020-10-07 NOTE — Patient Instructions (Signed)
  We will contact you via MyChart with lab results form today.   HOW TO SCHEDULE Mammogram [facility]  7 a.m.-6:30 p.m., Monday 7 a.m.-5 p.m., Tuesday-Friday Schedule an appointment by calling [phone].

## 2020-10-08 ENCOUNTER — Other Ambulatory Visit: Payer: Self-pay | Admitting: Adult Health Nurse Practitioner

## 2020-10-08 DIAGNOSIS — R591 Generalized enlarged lymph nodes: Secondary | ICD-10-CM

## 2020-10-08 DIAGNOSIS — N6459 Other signs and symptoms in breast: Secondary | ICD-10-CM

## 2020-10-08 LAB — COMPLETE METABOLIC PANEL WITH GFR
AG Ratio: 2 (calc) (ref 1.0–2.5)
ALT: 17 U/L (ref 6–29)
AST: 15 U/L (ref 10–30)
Albumin: 4.6 g/dL (ref 3.6–5.1)
Alkaline phosphatase (APISO): 56 U/L (ref 31–125)
BUN: 15 mg/dL (ref 7–25)
CO2: 25 mmol/L (ref 20–32)
Calcium: 9.7 mg/dL (ref 8.6–10.2)
Chloride: 101 mmol/L (ref 98–110)
Creat: 0.7 mg/dL (ref 0.50–1.10)
GFR, Est African American: 125 mL/min/{1.73_m2} (ref >=60)
GFR, Est Non African American: 108 mL/min/{1.73_m2} (ref >=60)
Globulin: 2.3 g/dL (calc) (ref 1.9–3.7)
Glucose, Bld: 91 mg/dL (ref 65–99)
Potassium: 4.2 mmol/L (ref 3.5–5.3)
Sodium: 138 mmol/L (ref 135–146)
Total Bilirubin: 0.5 mg/dL (ref 0.2–1.2)
Total Protein: 6.9 g/dL (ref 6.1–8.1)

## 2020-10-08 LAB — IRON, TOTAL/TOTAL IRON BINDING CAP
%SAT: 16 % (calc) (ref 16–45)
Iron: 64 ug/dL (ref 40–190)
TIBC: 390 mcg/dL (calc) (ref 250–450)

## 2020-10-08 LAB — CBC WITH DIFFERENTIAL/PLATELET
Absolute Monocytes: 718 cells/uL (ref 200–950)
Basophils Absolute: 21 cells/uL (ref 0–200)
Basophils Relative: 0.2 %
Eosinophils Absolute: 42 cells/uL (ref 15–500)
Eosinophils Relative: 0.4 %
HCT: 41.1 % (ref 35.0–45.0)
Hemoglobin: 13.8 g/dL (ref 11.7–15.5)
Lymphs Abs: 3026 cells/uL (ref 850–3900)
MCH: 29.3 pg (ref 27.0–33.0)
MCHC: 33.6 g/dL (ref 32.0–36.0)
MCV: 87.3 fL (ref 80.0–100.0)
MPV: 11.9 fL (ref 7.5–12.5)
Monocytes Relative: 6.9 %
Neutro Abs: 6594 cells/uL (ref 1500–7800)
Neutrophils Relative %: 63.4 %
Platelets: 168 10*3/uL (ref 140–400)
RBC: 4.71 10*6/uL (ref 3.80–5.10)
RDW: 12.9 % (ref 11.0–15.0)
Total Lymphocyte: 29.1 %
WBC: 10.4 10*3/uL (ref 3.8–10.8)

## 2020-10-13 ENCOUNTER — Ambulatory Visit (HOSPITAL_COMMUNITY)
Admission: RE | Admit: 2020-10-13 | Discharge: 2020-10-13 | Disposition: A | Discharge status: Home / Self Care | Payer: BC Managed Care – PPO | Source: Ambulatory Visit | Attending: Adult Health Nurse Practitioner | Admitting: Adult Health Nurse Practitioner

## 2020-10-13 ENCOUNTER — Other Ambulatory Visit: Payer: Self-pay

## 2020-10-13 DIAGNOSIS — N6459 Other signs and symptoms in breast: Secondary | ICD-10-CM

## 2020-10-13 DIAGNOSIS — R591 Generalized enlarged lymph nodes: Secondary | ICD-10-CM | POA: Insufficient documentation

## 2020-10-14 ENCOUNTER — Other Ambulatory Visit: Payer: Self-pay | Admitting: Neurology

## 2020-10-14 DIAGNOSIS — F331 Major depressive disorder, recurrent, moderate: Secondary | ICD-10-CM

## 2020-10-18 ENCOUNTER — Other Ambulatory Visit: Payer: Self-pay | Admitting: Adult Health Nurse Practitioner

## 2020-10-18 DIAGNOSIS — R591 Generalized enlarged lymph nodes: Secondary | ICD-10-CM

## 2020-10-18 DIAGNOSIS — M25512 Pain in left shoulder: Secondary | ICD-10-CM

## 2020-10-18 MED ORDER — PREDNISONE 10 MG (21) PO TBPK: ORAL_TABLET | Freq: Every day | ORAL | 0 refills | Status: DC

## 2020-10-18 NOTE — Progress Notes (Signed)
Previous mammogram & ultrasound negative for acute abnormalities.  Continued reported edema dn discomfort, bilaterally.  Left should and neck pain.  Previous lab unremarkable.  Will send in prednisone taper pack and continue to monitor.  Patient agreeable to this plan.  Contact office with any new or worsening symptoms.  Lauren [name], Edrick Oh, DNP [facility] 10/18/2020  1:30 PM

## 2020-10-27 ENCOUNTER — Ambulatory Visit: Payer: BC Managed Care – PPO | Admitting: Internal Medicine

## 2020-10-29 ENCOUNTER — Ambulatory Visit (INDEPENDENT_AMBULATORY_CARE_PROVIDER_SITE_OTHER): Payer: BC Managed Care – PPO | Admitting: Internal Medicine

## 2020-10-29 ENCOUNTER — Other Ambulatory Visit: Payer: Self-pay

## 2020-10-29 VITALS — BP 114/76 | HR 95 | Temp 97.4°F | Resp 16 | Ht 62.0 in | Wt 193.2 lb

## 2020-10-29 DIAGNOSIS — Z79899 Other long term (current) drug therapy: Secondary | ICD-10-CM | POA: Diagnosis not present

## 2020-10-29 DIAGNOSIS — E782 Mixed hyperlipidemia: Secondary | ICD-10-CM | POA: Diagnosis not present

## 2020-10-29 DIAGNOSIS — E039 Hypothyroidism, unspecified: Secondary | ICD-10-CM | POA: Diagnosis not present

## 2020-10-29 DIAGNOSIS — R7309 Other abnormal glucose: Secondary | ICD-10-CM

## 2020-10-29 DIAGNOSIS — E559 Vitamin D deficiency, unspecified: Secondary | ICD-10-CM | POA: Diagnosis not present

## 2020-10-29 DIAGNOSIS — I10 Essential (primary) hypertension: Secondary | ICD-10-CM

## 2020-10-29 DIAGNOSIS — F319 Bipolar disorder, unspecified: Secondary | ICD-10-CM

## 2020-10-29 NOTE — Progress Notes (Signed)
History of Present Illness:       This very nice 39 y.o. MWF presents for 3 month follow up with HTN, HLD, Pre-Diabetes and Vitamin D Deficiency.       Patient is monitored expectantly for elevated BP and her BP has been controlled at home. Today's BP is at goal - 114/76. Patient has had no complaints of any cardiac type chest pain, palpitations, dyspnea / orthopnea / PND, dizziness, claudication, or dependent edema.       Hyperlipidemia is controlled with diet & meds. Patient denies myalgias or other med SE's. Last Lipids were at goal:  Lab Results  Component Value Date   CHOL 139 04/15/2020   HDL 49 (L) 04/15/2020   LDLCALC 68 04/15/2020   LDLDIRECT 114.9 04/30/2012   TRIG 138 04/15/2020   CHOLHDL 2.8 04/15/2020    Also, the patient has history of PreDiabetes and has had no symptoms of reactive hypoglycemia, diabetic polys, paresthesias or visual blurring.  Last A1c was normal & at goal:  Lab Results  Component Value Date   HGBA1C 5.5 04/15/2020       Further, the patient also has history of Vitamin D Deficiency and supplements vitamin D without any suspected side-effects. Last vitamin D was at goal:  Lab Results  Component Value Date   VD25OH 72 04/15/2020    Current Outpatient Medications on File Prior to Visit  Medication Sig  . ALPRAZolam (XANAX) 0.5 MG tablet Take 1/2 - 1 tablet      At Bedtime      ONLY if needed for Sleep    &  limit to 5 days /week to avoid Addiction & Dementia  . amphetamine-dextroamphetamine ([name]) 10 MG 24 hr capsule Take 1 capsule (10 mg total) by mouth daily.  . ARIPiprazole ([name]) 10 MG tablet TAKE 1/2 TABLET (5MG ) BY MOUTH DAILY  . Cholecalciferol (VITAMIN D3) 125 MCG (5000 UT) CAPS Take 1 capsule by mouth daily.  . Iron-Vitamin C 65-125 MG TABS Take by mouth daily.  L-Methylfolate-Algae (DEPLIN 15 PO) Take by mouth.  Marland Kitchen LITHIUM CARBONATE ER PO Take 450 mg by mouth at bedtime.  . pantoprazole (PROTONIX) 40 MG tablet TAKE  1 TABLET BY MOUTH EVERY DAY  . rosuvastatin (CRESTOR) 5 MG tablet Take    1 tablet    Daily     for Cholesterol  . Solriamfetol HCl ([name]) 150 MG TABS Take 150 mg by mouth every morning.  Marland Kitchen SYNTHROID 150 MCG tablet TAKE 1 TABLET BY MOUTH EVERY DAY BEFORE BREAKFAST  . vitamin E 100 UNIT capsule Take by mouth daily. Takes 180mg   . vortioxetine HBr ([name]) 20 MG TABS tablet Take 20 mg by mouth daily.  . traZODone (DESYREL) 50 MG tablet Take 0.5 tablets (25 mg total) by mouth at bedtime as needed for sleep. (Patient not taking: No sig reported)    PMHx:   Past Medical History:  Diagnosis Date  . Anemia   . Anxiety   . Constipation   . Depression   . Fibromyalgia   . GERD (gastroesophageal reflux disease)   . Hemorrhoids   . HSV-1 infection   . Hypertension   . Hypothyroidism   . OSA on CPAP   . Other abnormal glucose   . Preeclampsia     Immunization History  Administered Date(s) Administered  . Influenza Inj Mdck Quad Pf 09/11/2018, 09/11/2020  . Influenza Split 09/09/2015  . Influenza,inj,Quad PF,6+ Mos 09/11/2018, 07/25/2019  .  Influenza,inj,quad, With Preservative 09/19/2016  . Influenza-Unspecified 08/05/2013  . PFIZER SARS-COV-2 Vaccination 01/17/2020, 02/07/2020, 09/11/2020  . PPD Test 06/04/2018  . Td 11/21/1997  . Tdap 01/20/2011      FHx:    Reviewed / unchanged  SHx:    Reviewed / unchanged   Systems Review:  Constitutional: Denies fever, chills, wt changes, headaches, insomnia, fatigue, night sweats, change in appetite. Eyes: Denies redness, blurred vision, diplopia, discharge, itchy, watery eyes.  ENT: Denies discharge, congestion, post nasal drip, epistaxis, sore throat, earache, hearing loss, dental pain, tinnitus, vertigo, sinus pain, snoring.  CV: Denies chest pain, palpitations, irregular heartbeat, syncope, dyspnea, diaphoresis, orthopnea, PND, claudication or edema. Respiratory: denies cough, dyspnea, DOE, pleurisy, hoarseness, laryngitis,  wheezing.  Gastrointestinal: Denies dysphagia, odynophagia, heartburn, reflux, water brash, abdominal pain or cramps, nausea, vomiting, bloating, diarrhea, constipation, hematemesis, melena, hematochezia  or hemorrhoids. Genitourinary: Denies dysuria, frequency, urgency, nocturia, hesitancy, discharge, hematuria or flank pain. Musculoskeletal: Denies arthralgias, myalgias, stiffness, jt. swelling, pain, limping or strain/sprain.  Skin: Denies pruritus, rash, hives, warts, acne, eczema or change in skin lesion(s). Neuro: No weakness, tremor, incoordination, spasms, paresthesia or pain. Psychiatric: Denies confusion, memory loss or sensory loss. Endo: Denies change in weight, skin or hair change.  Heme/Lymph: No excessive bleeding, bruising or enlarged lymph nodes.  Physical Exam  BP 114/76   Pulse 95   Temp (!) 97.4 F (36.3 C)   Resp 16   Ht 5\' 2"  (1.575 m)   Wt 193 lb 3.2 oz (87.6 kg)   SpO2 99%   BMI 35.34 kg/m   Appears  well nourished, well groomed  and in no distress.  Eyes: PERRLA, EOMs, conjunctiva no swelling or erythema. Sinuses: No frontal/maxillary tenderness ENT/Mouth: EAC's clear, TM's nl w/o erythema, bulging. Nares clear w/o erythema, swelling, exudates. Oropharynx clear without erythema or exudates. Oral hygiene is good. Tongue normal, non obstructing. Hearing intact.  Neck: Supple. Thyroid not palpable. Car 2+/2+ without bruits, nodes or JVD. Chest: Respirations nl with BS clear & equal w/o rales, rhonchi, wheezing or stridor.  Cor: Heart sounds normal w/ regular rate and rhythm without sig. murmurs, gallops, clicks or rubs. Peripheral pulses normal and equal  without edema.  Abdomen: Soft & bowel sounds normal. Non-tender w/o guarding, rebound, hernias, masses or organomegaly.  Lymphatics: Unremarkable.  Musculoskeletal: Full ROM all peripheral extremities, joint stability, 5/5 strength and normal gait.  Skin: Warm, dry without exposed rashes, lesions or ecchymosis  apparent.  Neuro: Cranial nerves intact, reflexes equal bilaterally. Sensory-motor testing grossly intact. Tendon reflexes grossly intact.  Pysch: Alert & oriented x 3.  Insight and judgement nl & appropriate. No ideations.  Assessment and Plan:  1. Essential hypertension  - Continue medication, monitor blood pressure at home.  - Continue DASH diet.  Reminder to go to the ER if any CP,  SOB, nausea, dizziness, severe HA, changes vision/speech.  - CBC with Differential/Platelet - COMPLETE METABOLIC PANEL WITH GFR - Magnesium - TSH  2. Hyperlipidemia, mixed  - Continue diet/meds, exercise,& lifestyle modifications.  - Continue monitor periodic cholesterol/liver & renal functions   - Lipid panel - TSH  3. Abnormal glucose  - Continue diet, exercise  - Lifestyle modifications.  - Monitor appropriate labs.  - Hemoglobin A1c - Insulin, random  4. Vitamin D deficiency  - Continue supplementation.  - VITAMIN D 25 Hydroxy   5. Hypothyroidism  - TSH  6. Bipolar 1 disorder (HCC)  - Lithium level  7. Medication management  - CBC with  Differential/Platelet - COMPLETE METABOLIC PANEL WITH GFR - Magnesium - Lipid panel - TSH - Hemoglobin A1c - Insulin, random - VITAMIN D 25 Hydroxy - Lithium level       Discussed  regular exercise, BP monitoring, weight control to achieve/maintain BMI less than 25 and discussed med and SE's. Recommended labs to assess and monitor clinical status with further disposition pending results of labs.  I discussed the assessment and treatment plan with the patient. The patient was provided an opportunity to ask questions and all were answered. The patient agreed with the plan and demonstrated an understanding of the instructions.  I provided over 30 minutes of exam, counseling, chart review and  complex critical decision making.   Marinus Maw, MD

## 2020-10-29 NOTE — Patient Instructions (Addendum)
Due to recent changes in healthcare laws, you may see the results of your imaging and laboratory studies on MyChart before your provider has had a chance to review them.  We understand that in some cases there may be results that are confusing or concerning to you. Not all laboratory results come back in the same time frame and the provider may be waiting for multiple results in order to interpret others.  Please give us 48 hours in order for your provider to thoroughly review all the results before contacting the office for clarification of your results.     ++++++++++++++++++++++++++++++++++  Vit D  & Vit C 1,000 mg   are recommended to help protect  against the Covid-19 and other Corona viruses.    Also it's recommended  to take  Zinc 50 mg  to help  protect against the Covid-19   and best place to get  is also on [employer]  and don't pay more than 6-8 cents /pill !   ===================================== Coronavirus (COVID-19) Are you at risk?  Are you at risk for the Coronavirus (COVID-19)?  To be considered HIGH RISK for Coronavirus (COVID-19), you have to meet the following criteria:  . Traveled to [place], [place], [place], [place] or [place]; or in the [place] to [place], [place], [place]  . or [place]; and have fever, cough, and shortness of breath within the last 2 weeks of travel OR . Been in close contact with a person diagnosed with COVID-19 within the last 2 weeks and have  . fever, cough,and shortness of breath .  . IF YOU DO NOT MEET THESE CRITERIA, YOU ARE CONSIDERED LOW RISK FOR COVID-19.  What to do if you are HIGH RISK for COVID-19?  . If you are having a medical emergency, call 911. . Seek medical care right away. Before you go to a doctor's office, urgent care or emergency department, .  call ahead and tell them about your recent travel, contact with someone diagnosed with COVID-19  .  and your symptoms.  . You should receive instructions  from your physician's office regarding next steps of care.  . When you arrive at healthcare provider, tell the healthcare staff immediately you have returned from  . visiting [place], [place], [place], [place] or [place]; or traveled in the [place] to [place], [place],  . [place] or [place] in the last two weeks or you have been in close contact with a person diagnosed with  . COVID-19 in the last 2 weeks.   . Tell the health care staff about your symptoms: fever, cough and shortness of breath. . After you have been seen by a medical provider, you will be either: o Tested for (COVID-19) and discharged home on quarantine except to seek medical care if  o symptoms worsen, and asked to  - Stay home and avoid contact with others until you get your results (4-5 days)  - Avoid travel on public transportation if possible (such as bus, train, or airplane) or o Sent to the Emergency Department by EMS for evaluation, COVID-19 testing  and  o possible admission depending on your condition and test results.  What to do if you are LOW RISK for COVID-19?  Reduce your risk of any infection by using the same precautions used for avoiding the common cold or flu:  . Wash your hands often with soap and warm water for at least 20 seconds.  If soap and water   are not readily available,  . use an alcohol-based hand sanitizer with at least 60% alcohol.  . If coughing or sneezing, cover your mouth and nose by coughing or sneezing into the elbow areas of your shirt or coat, .  into a tissue or into your sleeve (not your hands). . Avoid shaking hands with others and consider head nods or verbal greetings only. . Avoid touching your eyes, nose, or mouth with unwashed hands.  . Avoid close contact with people who are sick. . Avoid places or events with large numbers of people in one location, like concerts or sporting events. . Carefully consider travel plans you have or are making. . If you are planning  any travel outside or inside the [place], visit the CDC's Travelers' Health webpage for the latest health notices. . If you have some symptoms but not all symptoms, continue to monitor at home and seek medical attention  . if your symptoms worsen. . If you are having a medical emergency, call 911.   ++++++++++++++++++++++++++++++++ Recommend Adult Low Dose Aspirin or  coated  Aspirin 81 mg daily  To reduce risk of Colon Cancer 40 %,  Skin Cancer 26 % ,  Melanoma 46%  and  Pancreatic cancer 60% ++++++++++++++++++++++++++++++++ Vitamin D goal  is between 70-100.  Please make sure that you are taking your Vitamin D as directed.  It is very important as a natural anti-inflammatory  helping hair, skin, and nails, as well as reducing stroke and heart attack risk.  It helps your bones and helps with mood. It also decreases numerous cancer risks so please take it as directed.  Low Vit D is associated with a 200-300% higher risk for CANCER  and 200-300% higher risk for HEART   ATTACK  &  STROKE.   ...................................... It is also associated with higher death rate at younger ages,  autoimmune diseases like Rheumatoid arthritis, Lupus, Multiple Sclerosis.    Also many other serious conditions, like depression, Alzheimer's Dementia, infertility, muscle aches, fatigue, fibromyalgia - just to name a few. ++++++++++++++++++++ Recommend the book "The END of DIETING" by Dr Joel Fuhrman  & the book "The END of DIABETES " by Dr Joel Fuhrman At [employer] - get book & Audio CD's    Being diabetic has a  300% increased risk for heart attack, stroke, cancer, and alzheimer- type vascular dementia. It is very important that you work harder with diet by avoiding all foods that are white. Avoid white rice (brown & wild rice is OK), white potatoes (sweetpotatoes in moderation is OK), White bread or wheat bread or anything made out of white flour like bagels, donuts, rolls, buns, biscuits, cakes,  pastries, cookies, pizza crust, and pasta (made from white flour & egg whites) - vegetarian pasta or spinach or wheat pasta is OK. Multigrain breads like Arnold's or Pepperidge Farm, or multigrain sandwich thins or flatbreads.  Diet, exercise and weight loss can reverse and cure diabetes in the early stages.  Diet, exercise and weight loss is very important in the control and prevention of complications of diabetes which affects every system in your body, ie. Brain - dementia/stroke, eyes - glaucoma/blindness, heart - heart attack/heart failure, kidneys - dialysis, stomach - gastric paralysis, intestines - malabsorption, nerves - severe painful neuritis, circulation - gangrene & loss of a leg(s), and finally cancer and Alzheimers.    I recommend avoid fried & greasy foods,  sweets/candy, white rice (brown or wild rice or Quinoa is OK), white potatoes (  sweet potatoes are OK) - anything made from white flour - bagels, doughnuts, rolls, buns, biscuits,white and wheat breads, pizza crust and traditional pasta made of white flour & egg white(vegetarian pasta or spinach or wheat pasta is OK).  Multi-grain bread is OK - like multi-grain flat bread or sandwich thins. Avoid alcohol in excess. Exercise is also important.    Eat all the vegetables you want - avoid meat, especially red meat and dairy - especially cheese.  Cheese is the most concentrated form of trans-fats which is the worst thing to clog up our arteries. Veggie cheese is OK which can be found in the fresh produce section at [employer] or [employer] or [employer]  +++++++++++++++++++++ DASH Eating Plan  DASH stands for "Dietary Approaches to Stop Hypertension."   The DASH eating plan is a healthy eating plan that has been shown to reduce high blood pressure (hypertension). Additional health benefits may include reducing the risk of type 2 diabetes mellitus, heart disease, and stroke. The DASH eating plan may also help with weight loss. WHAT DO I  NEED TO KNOW ABOUT THE DASH EATING PLAN? For the DASH eating plan, you will follow these general guidelines:  Choose foods with a percent daily value for sodium of less than 5% (as listed on the food label).  Use salt-free seasonings or herbs instead of table salt or sea salt.  Check with your health care provider or pharmacist before using salt substitutes.  Eat lower-sodium products, often labeled as "lower sodium" or "no salt added."  Eat fresh foods.  Eat more vegetables, fruits, and low-fat dairy products.  Choose whole grains. Look for the word "whole" as the first word in the ingredient list.  Choose fish   Limit sweets, desserts, sugars, and sugary drinks.  Choose heart-healthy fats.  Eat veggie cheese   Eat more home-cooked food and less restaurant, buffet, and fast food.  Limit fried foods.  Cook foods using methods other than frying.  Limit canned vegetables. If you do use them, rinse them well to decrease the sodium.  When eating at a restaurant, ask that your food be prepared with less salt, or no salt if possible.                      WHAT FOODS CAN I EAT? Read Dr Joel Fuhrman's books on The End of Dieting & The End of Diabetes  Grains Whole grain or whole wheat bread. Brown rice. Whole grain or whole wheat pasta. Quinoa, bulgur, and whole grain cereals. Low-sodium cereals. Corn or whole wheat flour tortillas. Whole grain cornbread. Whole grain crackers. Low-sodium crackers.  Vegetables Fresh or frozen vegetables (raw, steamed, roasted, or grilled). Low-sodium or reduced-sodium tomato and vegetable juices. Low-sodium or reduced-sodium tomato sauce and paste. Low-sodium or reduced-sodium canned vegetables.   Fruits All fresh, canned (in natural juice), or frozen fruits.  Protein Products  All fish and seafood.  Dried beans, peas, or lentils. Unsalted nuts and seeds. Unsalted canned beans.  Dairy Low-fat dairy products, such as skim or 1% milk, 2% or  reduced-fat cheeses, low-fat ricotta or cottage cheese, or plain low-fat yogurt. Low-sodium or reduced-sodium cheeses.  Fats and Oils Tub margarines without trans fats. Light or reduced-fat mayonnaise and salad dressings (reduced sodium). Avocado. Safflower, olive, or canola oils. Natural peanut or almond butter.  Other Unsalted popcorn and pretzels. The items listed above may not be a complete list of recommended foods or beverages. Contact your dietitian for more   options.  +++++++++++++++  WHAT FOODS ARE NOT RECOMMENDED? Grains/ White flour or wheat flour White bread. White pasta. White rice. Refined cornbread. Bagels and croissants. Crackers that contain trans fat.  Vegetables  Creamed or fried vegetables. Vegetables in a . Regular canned vegetables. Regular canned tomato sauce and paste. Regular tomato and vegetable juices.  Fruits Dried fruits. Canned fruit in light or heavy syrup. Fruit juice.  Meat and Other Protein Products Meat in general - RED meat & White meat.  Fatty cuts of meat. Ribs, chicken wings, all processed meats as bacon, sausage, bologna, salami, fatback, hot dogs, bratwurst and packaged luncheon meats.  Dairy Whole or 2% milk, cream, half-and-half, and cream cheese. Whole-fat or sweetened yogurt. Full-fat cheeses or blue cheese. Non-dairy creamers and whipped toppings. Processed cheese, cheese spreads, or cheese curds.  Condiments Onion and garlic salt, seasoned salt, table salt, and sea salt. Canned and packaged gravies. Worcestershire sauce. Tartar sauce. Barbecue sauce. Teriyaki sauce. Soy sauce, including reduced sodium. Steak sauce. Fish sauce. Oyster sauce. Cocktail sauce. Horseradish. Ketchup and mustard. Meat flavorings and tenderizers. Bouillon cubes. Hot sauce. Tabasco sauce. Marinades. Taco seasonings. Relishes.  Fats and Oils Butter, stick margarine, lard, shortening and bacon fat. Coconut, palm kernel, or palm oils. Regular salad  dressings.  Pickles and olives. Salted popcorn and pretzels.  The items listed above may not be a complete list of foods and beverages to avoid.  ============================== Lithium tablets or capsules What is this medicine? LITHIUM (LITH ee um) is used to prevent and treat the manic episodes caused by manic-depressive illness. This medicine may be used for other purposes; ask your health care provider or pharmacist if you have questions. COMMON BRAND NAME(S): Eskalith What should I tell my health care provider before I take this medicine? They need to know if you have any of these conditions:  active infection  breathing problems  Brugada Syndrome  dehydration (diarrhea or sweating)  diet low in salt  heart disease  high levels of calcium in the blood  history of irregular heartbeat  kidney disease  low level of potassium or sodium in the blood  parathyroid disease  problems urinating  thyroid disease  an unusual or allergic reaction to lithium, other medicines, foods, dyes, or preservatives  pregnant or trying to get pregnant  breast-feeding How should I use this medicine? Take this medicine by mouth with a glass of water. Follow the directions on the prescription label. Take after a meal or snack to avoid stomach upset. Take your doses at regular intervals. Do not take your medicine more often than directed. The amount of this medicine you take is very important. Taking more than the prescribed dose can cause serious side effects. Do not stop taking except on the advice of your doctor or health care professional. Talk to your pediatrician regarding the use of this medicine in children. Special care may be needed. While this drug may be prescribed for children as young as 7 years for selected conditions, precautions do apply. Overdosage: If you think you have taken too much of this medicine contact a poison control center or emergency room at once. NOTE: This  medicine is only for you. Do not share this medicine with others. What if I miss a dose? If you miss a dose, take it as soon as you can. If it is almost time for your next dose, take only that dose. Do not take double or extra doses. What may interact with this medicine? Do not  take this medicine with any of the following medications:  cisapride  dronedarone  pimozide  thioridazine This medicine may also interact with the following medications:  buspirone  caffeine  calcium iodide  carbamazepine  certain medicines for depression, anxiety, or psychotic disturbances  certain medicines for high blood pressure  certain medicines for migraine headache like almotriptan, eletriptan, frovatriptan, naratriptan, rizatriptan, sumatriptan, zolmitriptan  diuretics  fentanyl  linezolid  MAOIs like Carbex, Eldepryl, Marplan, Nardil, and Parnate  medicines that relax muscles for surgery  metronidazole  NSAIDs, medicines for pain and inflammation, like ibuprofen or naproxen  other medicines that prolong the QT interval (cause an abnormal heart rhythm) like dofetilide  phenytoin  potassium iodide, KI  sodium bicarbonate  sodium chloride  St. John's Wort  theophylline  tramadol  tryptophan  urea This list may not describe all possible interactions. Give your health care provider a list of all the medicines, herbs, non-prescription drugs, or dietary supplements you use. Also tell them if you smoke, drink alcohol, or use illegal drugs. Some items may interact with your medicine. What should I watch for while using this medicine? Visit your doctor or health care professional for regular checks on your progress. It can take several weeks of treatment before you start to get better. The amount of salt (sodium) in your body influences the effects of this medicine, and this medicine can increase salt loss from the body. Eat a normal diet that includes salt. Do not change to  salt substitutes. Avoid changes involving diet, or medications that include large amounts of sodium like sodium bicarbonate. Ask your doctor or health care professional for advice if you are not sure. Drink plenty of fluids while you are taking this medicine. Avoid drinks that contain caffeine, such as coffee, tea and colas. You will need extra fluids if you have diarrhea or sweat a lot. This will help prevent toxic effects from this medicine. Be careful not to get overheated during exercise, saunas, hot baths, and hot weather. Consult your doctor or health care professional if you have a high fever or persistent diarrhea. You may get drowsy or dizzy. Do not drive, use machinery, or do anything that needs mental alertness until you know how this medicine affects you. Do not stand or sit up quickly, especially if you are an older patient. This reduces the risk of dizzy or fainting spells. What side effects may I notice from receiving this medicine? Side effects that you should report to your doctor or health care professional as soon as possible:  allergic reactions like skin rash, itching or hives, swelling of the face, lips, or tongue  breathing problems  changes in emotions or moods  changes in vision  fingers or toes feel numb, cool, painful  hallucinations, loss of contact with reality  increased thirst  increased urination  mild tremor (hands)  persistent headache with or without blurred vision  signs and symptoms of a dangerous change in heartbeat or heart rhythm like chest pain; dizziness; fast, irregular heartbeat; palpitations; feeling faint or lightheaded; falls; breathing problems  signs and symptoms of lithium toxicity like diarrhea, vomiting, increased tremor, loss of balance or coordination, drowsiness, ringing of the ears, muscle weakness or twitching, slurred speech, confusion, seizures  unusually weak or tired Side effects that usually do not require medical attention  (report to your doctor or health care professional if they continue or are bothersome):  decreased appetite  mild thirst  nausea  stomach upset  tiredness This list  may not describe all possible side effects. Call your doctor for medical advice about side effects. You may report side effects to FDA at 1-800-FDA-1088. Where should I keep my medicine? Keep out of the reach of children. Store at room temperature between 15 and 30 degrees C (59 and 86 degrees F). Throw away any unused medicine after the expiration date. ==============================  Lithium Toxicity Lithium toxicity, which is also called lithium poisoning, is the condition of having too much lithium in the blood. Lithium is a medicine that is used to treat bipolar disorder. Lithium toxicity can be life-threatening. What are the causes? This condition is caused by:  Ingesting too much lithium, causing levels of lithium to rise in your body.  Taking lithium on a regular basis and: ? Having a condition that raises lithium levels in the body. ? Taking another medicine that raises lithium levels in the body.  Taking excess amounts of lithium in trying to take one's life (suicide attempt).  A decrease in kidney (renal) function because of dehydration, or as a side effect of another medicine. What increases the risk? This condition is more likely to develop in people:  Who are young or old.  Who have kidney disease.  Who have heart disease.  Who have dehydration that is caused by sweating or diarrhea.  Who have low sodium levels in the body. Certain medicines can increase the risk for this condition. They include:  Water pills (diuretics).  Certain medicines for high blood pressure.  Nonsteroidal anti-inflammatory drugs (NSAIDs). What are the signs or symptoms? Symptoms of mild to moderate lithium toxicity are:  Nausea and vomiting.  Diarrhea.  Drowsiness.  Thirst.  Muscle weakness.  Slurred  speech.  Lack of coordination.  Frequent urination.  Being restless (agitation). The symptoms of moderate to severe lithium toxicity are:  Blurred vision.  Giddiness.  Ringing in the ears.  Severe muscle spasms.  Seizures.  Abnormal heart rhythm.  Loss of consciousness or coma. How is this diagnosed? This condition may be diagnosed based on:  Your signs and symptoms.  Your use of lithium. You will be asked about how much lithium you take, how long you have been taking it, and when you took your last dose.  Blood tests. These are done to check the level of lithium in your blood. How is this treated? Treatment for this condition depends on how severe it is. It often involves special monitoring and hospitalization.  For mild or moderate toxicity, your dosage of lithium may be reduced or stopped.  For severe toxicity, lithium may be removed from your body. This is done in a hospital emergency department. It may involve: ? Gastric lavage. A tube is placed through your nose or mouth into your stomach. The tube is used to remove lithium that has not yet been digested. It may also be used to put medicines directly into your stomach to help stop lithium from being absorbed. ? Medicines that increase removal of lithium by your kidneys. ? Use of an artificial kidney to clean your blood (dialysis). This is usually done only in the most severe cases. Follow these instructions at home:      Take over-the-counter and prescription medicines only as told by your health care provider.  Drink enough fluid to keep your urine pale yellow.  If your toxicity was a result of an intentional overdose, work with a Psychologist, counselling (psychiatrist or counselor). How is this prevented? Lithium toxicity is preventable. To keep it  from recurring:  Take medicines only as told by your health care provider.  Drink enough fluid to keep your urine pale yellow.  Talk with your health  care provider before starting a low-salt (low-sodium) diet.  Have your blood lithium levels checked regularly.  Watch for signs and symptoms of lithium toxicity. If you have signs or symptoms, get treatment early. This can help keep severe symptoms from developing.  Always ask about the risk of interactions when starting a new medicine. Contact a health care provider if:  You have signs or symptoms of mild or moderate lithium toxicity after you receive treatment, even if your blood lithium level is normal. Get help right away if:  Your symptoms get worse.  You have signs or symptoms of severe lithium toxicity after you receive treatment. Summary  Lithium toxicity, which is also called lithium poisoning, is the condition of having too much lithium in your blood.  This either results from taking too much lithium or from changes that cause the medicine to be eliminated from your body more slowly.  Treatment depends on the severity of the condition but often involves special monitoring and hospitalization.

## 2020-10-30 ENCOUNTER — Encounter: Payer: Self-pay | Admitting: Neurology

## 2020-10-30 LAB — CBC WITH DIFFERENTIAL/PLATELET
Absolute Monocytes: 674 cells/uL (ref 200–950)
Basophils Absolute: 21 cells/uL (ref 0–200)
Basophils Relative: 0.2 %
Eosinophils Absolute: 75 cells/uL (ref 15–500)
Eosinophils Relative: 0.7 %
HCT: 42.4 % (ref 35.0–45.0)
Hemoglobin: 14.2 g/dL (ref 11.7–15.5)
Lymphs Abs: 3071 cells/uL (ref 850–3900)
MCH: 29.6 pg (ref 27.0–33.0)
MCHC: 33.5 g/dL (ref 32.0–36.0)
MCV: 88.5 fL (ref 80.0–100.0)
MPV: 11.5 fL (ref 7.5–12.5)
Monocytes Relative: 6.3 %
Neutro Abs: 6859 cells/uL (ref 1500–7800)
Neutrophils Relative %: 64.1 %
Platelets: 193 10*3/uL (ref 140–400)
RBC: 4.79 10*6/uL (ref 3.80–5.10)
RDW: 12.6 % (ref 11.0–15.0)
Total Lymphocyte: 28.7 %
WBC: 10.7 10*3/uL (ref 3.8–10.8)

## 2020-10-30 LAB — COMPLETE METABOLIC PANEL WITH GFR
AG Ratio: 1.8 (calc) (ref 1.0–2.5)
ALT: 16 U/L (ref 6–29)
AST: 12 U/L (ref 10–30)
Albumin: 4.6 g/dL (ref 3.6–5.1)
Alkaline phosphatase (APISO): 52 U/L (ref 31–125)
BUN: 15 mg/dL (ref 7–25)
CO2: 27 mmol/L (ref 20–32)
Calcium: 9.5 mg/dL (ref 8.6–10.2)
Chloride: 103 mmol/L (ref 98–110)
Creat: 0.74 mg/dL (ref 0.50–1.10)
GFR, Est African American: 117 mL/min/{1.73_m2} (ref >=60)
GFR, Est Non African American: 101 mL/min/{1.73_m2} (ref >=60)
Globulin: 2.6 g/dL (calc) (ref 1.9–3.7)
Glucose, Bld: 95 mg/dL (ref 65–99)
Potassium: 4.2 mmol/L (ref 3.5–5.3)
Sodium: 138 mmol/L (ref 135–146)
Total Bilirubin: 0.4 mg/dL (ref 0.2–1.2)
Total Protein: 7.2 g/dL (ref 6.1–8.1)

## 2020-10-30 LAB — HEMOGLOBIN A1C
Hgb A1c MFr Bld: 6 % of total Hgb — ABNORMAL HIGH (ref <5.7)
Mean Plasma Glucose: 126 mg/dL
eAG (mmol/L): 7 mmol/L

## 2020-10-30 LAB — INSULIN, RANDOM: Insulin: 13.5 u[IU]/mL

## 2020-10-30 LAB — TSH: TSH: 4.65 mIU/L — ABNORMAL HIGH

## 2020-10-30 LAB — LIPID PANEL
Cholesterol: 170 mg/dL (ref <200)
HDL: 58 mg/dL (ref >=50)
LDL Cholesterol (Calc): 88 mg/dL (calc)
Non-HDL Cholesterol (Calc): 112 mg/dL (calc) (ref <130)
Total CHOL/HDL Ratio: 2.9 (calc) (ref <5.0)
Triglycerides: 139 mg/dL (ref <150)

## 2020-10-30 LAB — MAGNESIUM: Magnesium: 2.1 mg/dL (ref 1.5–2.5)

## 2020-10-30 LAB — LITHIUM LEVEL: Lithium Lvl: <0.3 mmol/L — ABNORMAL LOW (ref 0.6–1.2)

## 2020-10-30 LAB — VITAMIN D 25 HYDROXY (VIT D DEFICIENCY, FRACTURES): Vit D, 25-Hydroxy: 67 ng/mL (ref 30–100)

## 2020-10-30 NOTE — Progress Notes (Signed)
========================================================== -   Test results slightly outside the reference range are not unusual. If there is anything important, I will review this with you,  otherwise it is considered normal test values.  If you have further questions,  please do not hesitate to contact me at the office or via My Chart.  ==========================================================  -  Lithium level very low - Please let Aram Beecham know Monday who your therapist Is to send copy of result ==========================================================  -  Total Chol = 170 and LDL 88 - Both  Excellent   - Very low risk for Heart Attack  / Stroke ========================================================  - Thyroid slight low - not enough to change dose and in fact may  be artifactual   - Please be sure Take  thyroid meds on an empty stomach with only  water for 30 minutes & no Antacid meds, Calcium or Magnesium for  4 hours & avoid Biotin ==========================================================  -  A1c back up again  in the borderline and  early or pre-diabetes range which has the same   300% increased risk for heart attack, stroke, cancer and   alzheimer- type vascular dementia as full blown diabetes.   But the good news is that diet, exercise with  weight loss can cure the early diabetes at this point. ==========================================================  -  Vitamin D = 67 - Great  -  at Goal  ==========================================================  -  All Else - CBC - Kidneys - Electrolytes - Liver - Magnesium & Thyroid    - all  Normal / OK ==========================================================

## 2020-11-01 ENCOUNTER — Encounter: Payer: Self-pay | Admitting: Internal Medicine

## 2020-11-10 ENCOUNTER — Other Ambulatory Visit: Payer: BC Managed Care – PPO

## 2020-11-19 ENCOUNTER — Other Ambulatory Visit: Payer: Self-pay | Admitting: Adult Health

## 2020-12-02 NOTE — Progress Notes (Signed)
Patient called endoscopy to cancel manometry for next Monday 12/07/20. I instructed patient to call the office to let them know and she states she already did. Will remove her from our schedule.

## 2020-12-03 ENCOUNTER — Ambulatory Visit (INDEPENDENT_AMBULATORY_CARE_PROVIDER_SITE_OTHER): Payer: BC Managed Care – PPO | Admitting: Adult Health Nurse Practitioner

## 2020-12-03 ENCOUNTER — Other Ambulatory Visit (HOSPITAL_COMMUNITY): Payer: BC Managed Care – PPO

## 2020-12-03 ENCOUNTER — Other Ambulatory Visit: Payer: Self-pay

## 2020-12-03 ENCOUNTER — Encounter: Payer: Self-pay | Admitting: Adult Health Nurse Practitioner

## 2020-12-03 VITALS — BP 124/86 | HR 86 | Temp 98.9°F | Ht 62.0 in | Wt 198.8 lb

## 2020-12-03 DIAGNOSIS — K582 Mixed irritable bowel syndrome: Secondary | ICD-10-CM | POA: Diagnosis not present

## 2020-12-03 DIAGNOSIS — Z1152 Encounter for screening for COVID-19: Secondary | ICD-10-CM | POA: Diagnosis not present

## 2020-12-03 LAB — POC COVID19 BINAXNOW: SARS Coronavirus 2 Ag: NEGATIVE

## 2020-12-03 MED ORDER — DICYCLOMINE HCL 10 MG PO CAPS: 10.0000 mg | ORAL_CAPSULE | Freq: Four times a day (QID) | ORAL | 0 refills | Status: DC

## 2020-12-03 NOTE — Progress Notes (Signed)
Assessment and Plan:  1. Encounter for screening for COVID-19 - POC COVID-19 BinaxNow  Irritable bowel syndrome with both constipation and diarrhea -     dicyclomine (BENTYL) 10 MG capsule; Take 1 capsule (10 mg total) by mouth 4 (four) times daily for 14 days. Increase water intake Once improved use benefiber OR metamucil to help bulk Increase water intake Increase activity Monitor hot flashes  Severe obesity (BMI 35.0-39.9) with comorbidity (HCC) Discussed dietary and exercise modifications   Contact office with any new or worsening symptoms  Further disposition pending results of labs. Discussed med's effects and SE's.   Over 30 minutes of face to face interview, exam, counseling, chart review, and critical decision making was performed.   Future Appointments  Date Time Provider Department Center  01/27/2021  4:00 PM Elder Negus, NP [facility] None  02/09/2021  3:30 PM Dohmeier, Porfirio Mylar, MD [facility] None    ------------------------------------------------------------------------------------------------------------------   HPI 40 y.o.female presents for symptoms that started at least one week ago.  She is having stomach pain and nauseated.  She has hot flashes at times and body aches. The body aches are mainly in her legs.  She reports some shaky at times.  She reports decrease in appetite. She is able to keep small foods down.  Reports she is drinking water but makes her more nauseated.  Her last bowel movement was today.  She reports some bloating.  She reports she has been constipated before but her stool is softy but she feels like she is not getting all of it out.  Past Medical History:  Diagnosis Date  . Anemia   . Anxiety   . Constipation   . Depression   . Fibromyalgia   . GERD (gastroesophageal reflux disease)   . Hemorrhoids   . HSV-1 infection   . Hypertension   . Hypothyroidism   . OSA on CPAP   . Other abnormal glucose   . Preeclampsia      Allergies   Allergen Reactions  . Lamictal [Lamotrigine] Rash  . Doxycycline Other (See Comments)    GI UPSET  . Effexor [Venlafaxine] Other (See Comments)    DYSPHORIA  . Savella [Milnacipran Hcl] Other (See Comments)    NO RELIEF  . Prilosec [Omeprazole] Itching    Current Outpatient Medications on File Prior to Visit  Medication Sig  . ALPRAZolam (XANAX) 0.5 MG tablet Take 1/2 - 1 tablet      At Bedtime      ONLY if needed for Sleep    &  limit to 5 days /week to avoid Addiction & Dementia  . amphetamine-dextroamphetamine ([name]) 10 MG 24 hr capsule Take 1 capsule (10 mg total) by mouth daily.  . ARIPiprazole ([name]) 10 MG tablet TAKE 1/2 TABLET (5MG ) BY MOUTH DAILY  . Cholecalciferol (VITAMIN D3) 125 MCG (5000 UT) CAPS Take 1 capsule by mouth daily.  . Iron-Vitamin C 65-125 MG TABS Take by mouth daily.  L-Methylfolate-Algae (DEPLIN 15 PO) Take by mouth.  Marland Kitchen LITHIUM CARBONATE ER PO Take 450 mg by mouth at bedtime.  . pantoprazole (PROTONIX) 40 MG tablet TAKE 1 TABLET BY MOUTH EVERY DAY  . rosuvastatin (CRESTOR) 5 MG tablet Take    1 tablet    Daily     for Cholesterol  . Solriamfetol HCl ([name]) 150 MG TABS Take 150 mg by mouth every morning.  Marland Kitchen SYNTHROID 150 MCG tablet TAKE 1 TABLET BY MOUTH EVERY DAY BEFORE BREAKFAST  . traZODone (DESYREL) 50  MG tablet Take 0.5 tablets (25 mg total) by mouth at bedtime as needed for sleep. (Patient not taking: No sig reported)  . vitamin E 100 UNIT capsule Take by mouth daily. Takes 180mg   . vortioxetine HBr ([name]) 20 MG TABS tablet Take 20 mg by mouth daily.   No current facility-administered medications on file prior to visit.    ROS: all negative except above.   Physical Exam:  BP 124/86   Pulse 86   Temp 98.9 F (37.2 C)   Ht 5\' 2"  (1.575 m)   Wt 198 lb 12.8 oz (90.2 kg)   SpO2 99%   BMI 36.36 kg/m   General Appearance: Well nourished, in no apparent distress. Eyes: PERRLA, EOMs, conjunctiva no swelling or  erythema Sinuses: No Frontal/maxillary tenderness ENT/Mouth: Ext aud canals clear, TMs without erythema, bulging. No erythema, swelling, or exudate on post pharynx.  Tonsils not swollen or erythematous. Hearing normal.  Neck: Supple, thyroid normal.  Respiratory: Respiratory effort normal, BS equal bilaterally without rales, rhonchi, wheezing or stridor.  Cardio: RRR with no MRGs. Brisk peripheral pulses without edema.  Abdomen: Soft, + BS, hyperactive.  LLQ tenderness, Non tender, no guarding, rebound, hernias, masses. Lymphatics: Non tender without lymphadenopathy.  Musculoskeletal: Full ROM, 5/5 strength, normal gait.  Skin: Warm, dry without rashes, lesions, ecchymosis.  Neuro: Cranial nerves intact. Normal muscle tone, no cerebellar symptoms. Sensation intact.  Psych: Awake and oriented X 3, normal affect, Insight and Judgment appropriate.    , , DNP [facility] 12/03/2020  3:45 PM

## 2020-12-03 NOTE — Patient Instructions (Addendum)
   Be sure to take Pantropazole (Protonix) every day.  Dicyclomine (Bentyl) take one tablet before meals and at bedtime as needed.  This can be for cramping, bloating symptoms.   Take Metamuceil daily to help add bulk to your stool for improved quality in bowel movements.

## 2020-12-07 ENCOUNTER — Encounter (HOSPITAL_COMMUNITY): Admission: RE | Payer: Self-pay | Source: Home / Self Care

## 2020-12-07 ENCOUNTER — Ambulatory Visit (HOSPITAL_COMMUNITY): Admission: RE | Admit: 2020-12-07 | Payer: BC Managed Care – PPO | Source: Home / Self Care | Admitting: General Surgery

## 2020-12-07 SURGERY — MANOMETRY, ANORECTAL

## 2020-12-31 ENCOUNTER — Other Ambulatory Visit (INDEPENDENT_AMBULATORY_CARE_PROVIDER_SITE_OTHER): Payer: BC Managed Care – PPO

## 2020-12-31 ENCOUNTER — Other Ambulatory Visit: Payer: Self-pay

## 2020-12-31 DIAGNOSIS — R410 Disorientation, unspecified: Secondary | ICD-10-CM

## 2020-12-31 DIAGNOSIS — E039 Hypothyroidism, unspecified: Secondary | ICD-10-CM | POA: Diagnosis not present

## 2020-12-31 DIAGNOSIS — Z79899 Other long term (current) drug therapy: Secondary | ICD-10-CM

## 2020-12-31 DIAGNOSIS — R42 Dizziness and giddiness: Secondary | ICD-10-CM

## 2020-12-31 DIAGNOSIS — F325 Major depressive disorder, single episode, in full remission: Secondary | ICD-10-CM

## 2021-01-01 ENCOUNTER — Other Ambulatory Visit: Payer: Self-pay | Admitting: Internal Medicine

## 2021-01-01 LAB — CBC WITH DIFFERENTIAL/PLATELET
Absolute Monocytes: 655 cells/uL (ref 200–950)
Basophils Absolute: 17 cells/uL (ref 0–200)
Basophils Relative: 0.2 %
Eosinophils Absolute: 59 cells/uL (ref 15–500)
Eosinophils Relative: 0.7 %
HCT: 42.1 % (ref 35.0–45.0)
Hemoglobin: 14.3 g/dL (ref 11.7–15.5)
Lymphs Abs: 2293 cells/uL (ref 850–3900)
MCH: 30 pg (ref 27.0–33.0)
MCHC: 34 g/dL (ref 32.0–36.0)
MCV: 88.3 fL (ref 80.0–100.0)
MPV: 11.8 fL (ref 7.5–12.5)
Monocytes Relative: 7.8 %
Neutro Abs: 5376 cells/uL (ref 1500–7800)
Neutrophils Relative %: 64 %
Platelets: 168 10*3/uL (ref 140–400)
RBC: 4.77 10*6/uL (ref 3.80–5.10)
RDW: 12.4 % (ref 11.0–15.0)
Total Lymphocyte: 27.3 %
WBC: 8.4 10*3/uL (ref 3.8–10.8)

## 2021-01-01 LAB — COMPLETE METABOLIC PANEL WITH GFR
AG Ratio: 1.8 (calc) (ref 1.0–2.5)
ALT: 23 U/L (ref 6–29)
AST: 20 U/L (ref 10–30)
Albumin: 4.7 g/dL (ref 3.6–5.1)
Alkaline phosphatase (APISO): 57 U/L (ref 31–125)
BUN: 9 mg/dL (ref 7–25)
CO2: 27 mmol/L (ref 20–32)
Calcium: 9.5 mg/dL (ref 8.6–10.2)
Chloride: 105 mmol/L (ref 98–110)
Creat: 0.67 mg/dL (ref 0.50–1.10)
GFR, Est African American: 126 mL/min/{1.73_m2} (ref >=60)
GFR, Est Non African American: 108 mL/min/{1.73_m2} (ref >=60)
Globulin: 2.6 g/dL (calc) (ref 1.9–3.7)
Glucose, Bld: 83 mg/dL (ref 65–99)
Potassium: 4 mmol/L (ref 3.5–5.3)
Sodium: 139 mmol/L (ref 135–146)
Total Bilirubin: 0.4 mg/dL (ref 0.2–1.2)
Total Protein: 7.3 g/dL (ref 6.1–8.1)

## 2021-01-01 LAB — TSH: TSH: 7.1 mIU/L — ABNORMAL HIGH

## 2021-01-01 LAB — LITHIUM LEVEL: Lithium Lvl: 0.3 mmol/L — ABNORMAL LOW (ref 0.6–1.2)

## 2021-01-02 ENCOUNTER — Other Ambulatory Visit: Payer: Self-pay | Admitting: Internal Medicine

## 2021-01-02 MED ORDER — LEVOTHYROXINE SODIUM 200 MCG PO TABS: 200.0000 ug | ORAL_TABLET | Freq: Every day | ORAL | 11 refills | Status: DC

## 2021-01-02 MED ORDER — SYNTHROID 200 MCG PO TABS
ORAL_TABLET | ORAL | 0 refills | Status: DC
Start: 2021-01-02 — End: 2021-04-13

## 2021-01-02 NOTE — Progress Notes (Signed)
========================================================== -   Test results slightly outside the reference range are not unusual. If there is anything important, I will review this with you,  otherwise it is considered normal test values.  If you have further questions,  please do not hesitate to contact me at the office or via My Chart.  ========================================================== ==========================================================  -  Lithium level very low - Recommend increase dose to                                                                          1 tablet 2 x /day  - AM  & PM   - then recheck level at 3/9 OV with Kyra  ========================================================== ==========================================================  -  CBC - OK - No Anemia & WBC - Normal implies No Infection  ========================================================== ==========================================================  -  TSH slightly elevated suggests Thyroid hormone level slightly low                (this frequently is a side effect of Lithium therapy)   - Sent new Rx for thyroid med to your [facility] store increasing dose                                                                              from 150 to 200 mcg Daily   - Also needs recheck at 3/9 OV w/ Kyra  ========================================================== ==========================================================  -  All Else -  Kidneys - Electrolytes - Liver  -     all else  Normal / OK ===========================================================

## 2021-01-05 ENCOUNTER — Other Ambulatory Visit: Payer: Self-pay | Admitting: Internal Medicine

## 2021-01-05 MED ORDER — LITHIUM CARBONATE ER 450 MG PO TBCR: EXTENDED_RELEASE_TABLET | ORAL | 0 refills | Status: DC

## 2021-01-06 ENCOUNTER — Other Ambulatory Visit: Payer: Self-pay

## 2021-01-06 ENCOUNTER — Encounter: Payer: Self-pay | Admitting: Adult Health Nurse Practitioner

## 2021-01-06 ENCOUNTER — Ambulatory Visit (INDEPENDENT_AMBULATORY_CARE_PROVIDER_SITE_OTHER): Payer: BC Managed Care – PPO | Admitting: Adult Health Nurse Practitioner

## 2021-01-06 VITALS — BP 126/84 | HR 72 | Temp 97.7°F | Wt 198.2 lb

## 2021-01-06 DIAGNOSIS — Z114 Encounter for screening for human immunodeficiency virus [HIV]: Secondary | ICD-10-CM | POA: Diagnosis not present

## 2021-01-06 DIAGNOSIS — R5381 Other malaise: Secondary | ICD-10-CM | POA: Diagnosis not present

## 2021-01-06 DIAGNOSIS — R410 Disorientation, unspecified: Secondary | ICD-10-CM | POA: Diagnosis not present

## 2021-01-06 DIAGNOSIS — R5383 Other fatigue: Secondary | ICD-10-CM

## 2021-01-06 NOTE — Progress Notes (Signed)
Assessment and Plan:  Lauren [name] was seen today for acute visit and altered mental status.  Diagnoses and all orders for this visit:  Malaise and fatigue -     Lithium level -     Sedimentation rate -     Vitamin B12 -     CK -     Heavy Metals Panel, Blood -     ANA -     Urinalysis w microscopic + reflex cultur  Confusion -     Lithium level -     Sedimentation rate -     RPR -     Vitamin B12 -     Heavy Metals Panel, Blood -     ANA -     Urinalysis w microscopic + reflex cultur  Screening for HIV (human immunodeficiency virus) -     HIV Antibody (routine testing w rflx)  Other orders -     REFLEXIVE URINE CULTURE  Brain fog of unknow origin.  Discussed follow up with neurology and also Dr Evelene Croon.  She is on Lithium, which level has not ben in therapeutic range, as well as depression, trintellix and abilify. Multiple vauge symptoms and unable to pin point origin.   Discussed this with patient and further follow up's.  Contact office with any new or worsening symptoms.   Further disposition pending results of labs. Discussed med's effects and SE's.   Over 30 minutes of exam, counseling, chart review, and critical decision making was performed.   Future Appointments  Date Time Provider Department Center  01/27/2021  4:00 PM Elder Negus, NP [facility] None  02/01/2021  2:30 PM Dohmeier, Porfirio Mylar, MD [facility] None  02/09/2021  3:30 PM Dohmeier, Porfirio Mylar, MD [facility] None    ------------------------------------------------------------------------------------------------------------------   HPI 40 y.o.female presents for evaluation of foggy heeded and fatigued.  She reports she has been fatigued but it is getting worse.  She reports that her skin is extra dry and she catches herself staring off.  She reports that her assistant at work told her she should go see someone.  She feels out of sorts and like she could possibly pass out.  She reports that she is sleeping well at  times. She reports her husband may be having seizures through the night that is waking her up at night. She follows with psychiatrist Dr Evelene Croon and she is taking   She has an upcoming appointment with Dr Dohmeier related to this same issue.  She is having facial flushing that is red.  07/08/20 MRI of brain, w/wo,  no abnormal lesions, unremarkable.  She has increasing symptoms with extensive vague complaints.  Concern for potential environmental exposure, toxicity?  Past Medical History:  Diagnosis Date  . Anemia   . Anxiety   . Constipation   . Depression   . Fibromyalgia   . GERD (gastroesophageal reflux disease)   . Hemorrhoids   . HSV-1 infection   . Hypertension   . Hypothyroidism   . OSA on CPAP   . Other abnormal glucose   . Preeclampsia      Allergies  Allergen Reactions  . Lamictal [Lamotrigine] Rash  . Doxycycline Other (See Comments)    GI UPSET  . Effexor [Venlafaxine] Other (See Comments)    DYSPHORIA  . Savella [Milnacipran Hcl] Other (See Comments)    NO RELIEF  . Prilosec [Omeprazole] Itching    Current Outpatient Medications on File Prior to Visit  Medication Sig  . ALPRAZolam (XANAX) 0.5 MG tablet  Take 1/2 - 1 tablet      At Bedtime      ONLY if needed for Sleep    &  limit to 5 days /week to avoid Addiction & Dementia  . amphetamine-dextroamphetamine ([name]) 10 MG 24 hr capsule Take 1 capsule (10 mg total) by mouth daily.  . ARIPiprazole ([name]) 10 MG tablet TAKE 1/2 TABLET (5MG ) BY MOUTH DAILY  . Cholecalciferol (VITAMIN D3) 125 MCG (5000 UT) CAPS Take 1 capsule by mouth daily.  dicyclomine (BENTYL) 10 MG capsule Take 1 capsule (10 mg total) by mouth 4 (four) times daily for 14 days.  . Iron-Vitamin C 65-125 MG TABS Take by mouth daily.  Marland Kitchen L-Methylfolate-Algae (DEPLIN 15 PO) Take by mouth.  . lithium carbonate (ESKALITH) 450 MG CR tablet Take  1 tablet  2 x /day - Stabilize Mood  . pantoprazole (PROTONIX) 40 MG tablet TAKE 1 TABLET BY  MOUTH EVERY DAY  . rosuvastatin (CRESTOR) 5 MG tablet TAKE 1 TABLET BY MOUTH EVERY DAY FOR CHOLESTEROL  . Solriamfetol HCl ([name]) 150 MG TABS Take 150 mg by mouth every morning.  Marland Kitchen SYNTHROID 200 MCG tablet Take  1 tablet  Daily  on an empty stomach with only water for 30 minutes & no Antacid meds, Calcium or Magnesium for 4 hours & avoid Biotin  . traZODone (DESYREL) 50 MG tablet Take 0.5 tablets (25 mg total) by mouth at bedtime as needed for sleep. (Patient not taking: No sig reported)  . vitamin E 100 UNIT capsule Take by mouth daily. Takes 180mg   . vortioxetine HBr ([name]) 20 MG TABS tablet Take 20 mg by mouth daily.   No current facility-administered medications on file prior to visit.    ROS: all negative except above.   Physical Exam:  BP 126/84   Pulse 72   Temp 97.7 F (36.5 C)   Wt 198 lb 3.2 oz (89.9 kg)   SpO2 99%   BMI 36.25 kg/m   General Appearance: Well nourished, in no apparent distress. Eyes: PERRLA, EOMs, conjunctiva no swelling or erythema Sinuses: No Frontal/maxillary tenderness ENT/Mouth: Ext aud canals clear, TMs without erythema, bulging. No erythema, swelling, or exudate on post pharynx.  Tonsils not swollen or erythematous. Hearing normal.  Neck: Supple, thyroid normal.  Respiratory: Respiratory effort normal, BS equal bilaterally without rales, rhonchi, wheezing or stridor.  Cardio: RRR with no MRGs. Brisk peripheral pulses without edema.  Abdomen: Soft, + BS.  Non tender, no guarding, rebound, hernias, masses. Lymphatics: Non tender without lymphadenopathy.  Musculoskeletal: Full ROM, 5/5 strength, normal gait.  Skin: Warm, dry without rashes, lesions, ecchymosis.  Neuro: Cranial nerves intact. Normal muscle tone, no cerebellar symptoms. Sensation intact.  Psych: Awake and oriented X 3, normal affect, Insight and Judgment appropriate.    Marland Kitchen, , DNP [facility] 01/06/2021  4:39  PM

## 2021-01-07 LAB — URINALYSIS W MICROSCOPIC + REFLEX CULTURE
Bacteria, UA: NONE SEEN /HPF
Bilirubin Urine: NEGATIVE
Glucose, UA: NEGATIVE
Hgb urine dipstick: NEGATIVE
Hyaline Cast: NONE SEEN /LPF
Ketones, ur: NEGATIVE
Leukocyte Esterase: NEGATIVE
Nitrites, Initial: NEGATIVE
Protein, ur: NEGATIVE
RBC / HPF: NONE SEEN /HPF (ref 0–2)
Specific Gravity, Urine: 1.012 (ref 1.001–1.03)
Squamous Epithelial / HPF: NONE SEEN /HPF (ref <=5)
WBC, UA: NONE SEEN /HPF (ref 0–5)
pH: 7 (ref 5.0–8.0)

## 2021-01-07 LAB — ANA: Anti Nuclear Antibody (ANA): NEGATIVE

## 2021-01-07 LAB — HIV ANTIBODY (ROUTINE TESTING W REFLEX): HIV 1&2 Ab, 4th Generation: NONREACTIVE

## 2021-01-07 LAB — NO CULTURE INDICATED

## 2021-01-08 LAB — HEAVY METALS PANEL, BLOOD
Arsenic: <10 mcg/L (ref <23)
Lead: <1 ug/dL (ref <5)
Mercury, B: <5 mcg/L (ref 0–10)

## 2021-01-08 LAB — LITHIUM LEVEL: Lithium Lvl: 0.4 mmol/L — ABNORMAL LOW (ref 0.6–1.2)

## 2021-01-10 DIAGNOSIS — R42 Dizziness and giddiness: Secondary | ICD-10-CM

## 2021-01-11 ENCOUNTER — Telehealth: Payer: Self-pay | Admitting: Neurology

## 2021-01-11 MED ORDER — DEXAMETHASONE 2 MG PO TABS: ORAL_TABLET | ORAL | 0 refills | Status: DC

## 2021-01-11 NOTE — Telephone Encounter (Signed)
Pt is having a hard time with daytime sleepiness. Please keep her in mind if any appointments come open. She is scheduled for 03/14, Dr. Oliva Bustard next available.

## 2021-01-11 NOTE — Telephone Encounter (Signed)
Called the patient because an opening was made available at 10 am with Dr Vickey Huger. Pt accepted this apt.

## 2021-01-12 ENCOUNTER — Encounter: Payer: Self-pay | Admitting: Neurology

## 2021-01-12 ENCOUNTER — Other Ambulatory Visit: Payer: Self-pay

## 2021-01-12 ENCOUNTER — Other Ambulatory Visit: Payer: Self-pay | Admitting: Neurology

## 2021-01-12 ENCOUNTER — Ambulatory Visit: Payer: BC Managed Care – PPO | Admitting: Neurology

## 2021-01-12 ENCOUNTER — Telehealth: Payer: Self-pay | Admitting: Neurology

## 2021-01-12 ENCOUNTER — Telehealth: Payer: Self-pay | Admitting: Orthopaedic Surgery

## 2021-01-12 VITALS — BP 140/97 | HR 102 | Ht 61.0 in | Wt 199.0 lb

## 2021-01-12 DIAGNOSIS — Z79899 Other long term (current) drug therapy: Secondary | ICD-10-CM

## 2021-01-12 DIAGNOSIS — G4733 Obstructive sleep apnea (adult) (pediatric): Secondary | ICD-10-CM | POA: Diagnosis not present

## 2021-01-12 DIAGNOSIS — G4710 Hypersomnia, unspecified: Secondary | ICD-10-CM

## 2021-01-12 DIAGNOSIS — F319 Bipolar disorder, unspecified: Secondary | ICD-10-CM | POA: Diagnosis not present

## 2021-01-12 DIAGNOSIS — G4753 Recurrent isolated sleep paralysis: Secondary | ICD-10-CM

## 2021-01-12 DIAGNOSIS — Z9989 Dependence on other enabling machines and devices: Secondary | ICD-10-CM

## 2021-01-12 DIAGNOSIS — M542 Cervicalgia: Secondary | ICD-10-CM

## 2021-01-12 MED ORDER — XYWAV 500 MG/ML PO SOLN: ORAL | 2 refills | Status: AC

## 2021-01-12 NOTE — Telephone Encounter (Signed)
Received call from patient. She is requesting records be sent to [facility]. I advised we need signed authorization. I emailed form to her per her request [email or web address].

## 2021-01-12 NOTE — Patient Instructions (Signed)
Calcium, Magnesium, Potassium, Sodium Oxybates Oral Solution What is this medicine? CALCIUM, MAGNESIUM, POTASSIUM, SODIUM OXYBATES (KAL see um, mag NEE zee um, poe TASS i um, SOE dee um OX i bates) is used to treat excessive sleepiness and cataplexy in patients with narcolepsy. Cataplexy causes a sudden muscle weakness due to a strong emotional response. It can also treat idiopathic hypersomnia in adults. This medicine may be used for other purposes; ask your health care provider or pharmacist if you have questions. COMMON BRAND NAME(S): Trudee Kuster What should I tell my health care provider before I take this medicine? They need to know if you have any of these conditions:  depression  history of drug or alcohol abuse problem  if you drink alcohol  liver disease  lung or breathing disease, like sleep apnea  mental illness  succinic semialdehyde dehydrogenase deficiency  suicidal thoughts, plans, or attempt; a previous suicide attempt by you or a family member  an unusual or allergic reaction to oxybate, other medicines, foods, dyes, or preservatives  pregnant or trying to get pregnant  breast-feeding How should I use this medicine? Take this medicine by mouth. Follow the directions on the prescription label. Use a specially marked oral syringe, spoon, or dropper to measure each dose. Ask your pharmacist if you do not have one. Household spoons are not accurate. Mix the dose with water as directed. Take this medicine on an empty stomach, or at least 2 hours after food. Take your medicine at regular intervals. Do not take it more often than directed. Do not stop taking except on your doctor's advice. This medicine comes with INSTRUCTIONS FOR USE. Ask your pharmacist for directions on how to use this medicine. Read the information carefully. Talk to your pharmacist or health care provider if you have questions. A special MedGuide will be given to you by the pharmacist with each prescription  and refill. Be sure to read this information carefully each time. Talk to your health care provider about the use of this medicine in children. While this drug may be prescribed for children as young as 7 years for selected conditions, precautions do apply. Overdosage: If you think you have taken too much of this medicine contact a poison control center or emergency room at once. NOTE: This medicine is only for you. Do not share this medicine with others. What if I miss a dose? If you miss a dose, skip it. Take your next dose at the normal time. Do not take extra or 2 doses at the same time to make up for the missed dose. What may interact with this medicine? Do not take this medicine with any of the following medications:  alcohol  medicines for sleep This medicine may also interact with the following medications:  antihistamines for allergy, cough, and cold  certain medicines for depression, like amitriptyline, fluoxetine, sertraline  certain medicines for seizures like phenobarbital, primidone  divalproex sodium  general anesthetics like halothane, isoflurane, methoxyflurane, propofol  medicines that relax muscles for surgery  narcotic medicines for pain  phenothiazines like chlorpromazine, mesoridazine, prochlorperazine, thioridazine  valproate or valproic acid This list may not describe all possible interactions. Give your health care provider a list of all the medicines, herbs, non-prescription drugs, or dietary supplements you use. Also tell them if you smoke, drink alcohol, or use illegal drugs. Some items may interact with your medicine. What should I watch for while using this medicine? Visit your doctor or health care professional for regular checks on your  progress. Tell your healthcare professional if your symptoms do not start to get better or if they get worse. This medicine has a risk of abuse and dependence. Your health care provider will check you for this while you  take this medicine. You may get drowsy or dizzy. This medicine causes sleep very quickly. You should only take your first dose at bedtime, while in bed. If you take this medicine twice nightly, take the second dose 2.5 to 4 hours after your first dose. Do not drive, use machinery, or do anything that needs mental alertness for at least 6 hours after taking this drug. Do not stand up or sit up quickly, especially if you are an older patient. This reduces the risk of dizzy or fainting spells. Alcohol may interfere with the effect of this medicine. Avoid alcoholic drinks. After taking this medicine, you may get up out of bed and do an activity that you do not know you are doing. The next morning, you may have no memory of this. Activities include driving a car ("sleep-driving"), making and eating food, talking on the phone, sexual activity, and sleep-walking. Serious injuries have occurred. Call your doctor right away if you find out you have done any of these activities. Do not take this medicine if you have used alcohol that evening. Do not take it if you have taken another medicine for sleep. The risk of doing these sleep-related activities is higher. If you or your family notice any changes in your behavior, such as new or worsening depression, thoughts of harming yourself, anxiety, other unusual or disturbing thoughts, or memory loss, call your healthcare professional right away. What side effects may I notice from receiving this medicine? Side effects that you should report to your doctor or health care professional as soon as possible:  allergic reactions like skin rash, itching or hives, swelling of the face, lips, or tongue  anxiety  breathing problems  confusion  hallucinations  seizures  signs and symptoms of low blood pressure like dizziness; feeling faint or lightheaded, falls; unusually weak or tired  sleepwalking  suicidal thoughts, mood changes  vomiting Side effects that  usually do not require medical attention (report these to your doctor or health care professional if they continue or are bothersome):  bedwetting  dizziness  drowsiness  headache  loss of appetite  nausea This list may not describe all possible side effects. Call your doctor for medical advice about side effects. You may report side effects to FDA at 1-800-FDA-1088. Where should I keep my medicine? Keep out of the reach of children and pets. This medicine can be abused. Keep it in a safe place to protect it from theft. Do not share it with anyone. It is only for you. Selling or giving away this medicine is dangerous and against the law. Store at room temperature between 20 and 25 degrees C (68 and 77 degrees F). Keep this medicine in the original container. Get rid of any unused medicine after the expiration date. After mxing a dose of this medicine with water, the medicine should be taken within 24 hours or emptied down the sink drain. This medicine may cause harm and death if it is taken by other adults, children, or pets. It is important to get rid of the medicine as soon as you no longer need it or it is expired. You can do this in two ways:  Take the medicine to a medicine take-back program. Check with your pharmacy or law  enforcement to find a location.  If you cannot return the medicine, flush it down the toilet. NOTE: This sheet is a summary. It may not cover all possible information. If you have questions about this medicine, talk to your doctor, pharmacist, or health care provider.  2021 Elsevier/Gold Standard (2020-07-03 19:17:47)

## 2021-01-12 NOTE — Addendum Note (Signed)
Addended by: Melvyn Novas on: 01/12/2021 11:25 AM   Modules accepted: Orders

## 2021-01-12 NOTE — Progress Notes (Signed)
SLEEP MEDICINE CLINIC   Provider:  Larey Seat, M D  Primary Care Physician:  Unk Pinto, MD   Referring Provider: Unk Pinto, MD   Chief Complaint  Patient presents with  . Follow-up     pt with husband, rm 48. Presents today with continued problems related to daytime sleepiness. She has also some concerns related to difficulty with concentrating and thinking. Struggling at work. Word finding difficulty. Head tingle sensation and describes balance as off (no falls) she notes hand tremors bilaterally. DME [employer]. Last brain MRI was in aug 2021. She indicates the symptoms progressively worsening    HPI:   Rv 01-12-2021; with husband, here with persistent hypersomnia and extreme fatigue, no cardiac disease, no diabetes, but elevated BP.   Has dizzyspells.lightheaded but positive romberg, staring spells, weakness spells. No loss of awareness.   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  She is not functioning at work and feels overwhelmed, and she is afraid she can't find another job. Thi sounds more like depression.      RV 08-12-2020: I have the pleasure of meeting with Lauren [name] today 3 months after she underwent a new attempt at a CPAP directed baseline polysomnography with an MSLT to follow.  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  She endorsed the Epworth Sleepiness Scale between 16 and 14 points at various times, she had no apnea using CPAP in the nightly study REM latency was still 60 minutes to my surprise, the multiple sleep latency test showed sleep onset with an average of 8 minutes which is a central sleep latency than desired however we were again unable to find any REM latencies or REM sleep onset.  Clinically the patient presents with symptoms that are very reminiscent of narcolepsy and there is a  possibility that this is an idiopathic hypersomnia that still can convert to early REM onset.  The patient had appropriately weaned off all REM suppressing medication prior to the test.  We discussed today to continue [name] which gave her a better daytime control of sleepiness without inducing insomnia at night.  She still feels that after lunch her concentration and wakefulness is decreasing.   And we discussed today to add in [name] form to help her through the day.  We also discussed that [name] and the highway for now have a new indication for idiopathic hypersomnia and excessive daytime sleepiness in patients such as Lauren [name] for but have not been able to be diagnosed with narcolepsy by the gold standard test of MSLT.  I also discussed the medication in detail about how to receive by mail, and that it has to be in a cool place away from food, but it has to be taken twice at night and that it induces a very deep sleep indeed sometimes with great difficulties to arouse the patient should that be any alarm or any situation coming up.  The patient lives with her spouse and teenage daughter and her 62 year old daughter and I think that her home environment is conducive to try anxiety with if the need arises.  So today I will add 10 mg XR [name] in addition to [facility] and I will let her tell me in 4 to 8 weeks if this is going well otherwise we will change her to [name] wave.  RV 04-01-2020 :  Lauren [name] is a 40 y.o. female , and seen here in a Rv  for a hypersomnia evaluation- RN : pt with husband, rm 33. presents today as a follow up. DME [employer] -CPAP  is working well. She is still having concerns of daytime sleepiness. states that she will take the [name]  in the morning which it helps but still feels tired.  she will take the immediate release at lunch time and will again helps her stay alert until 7/7:30 pm but she feels the medication is allowing her to be able to function but  she still feels tired.   The patient has been an excellent user of CPAP she is using an auto titration device between 5 and 12 cm of water pressure with 3 cm EPR and her 95th percentile pressure is 7.3 cm water.  Residual AHI is 0.2 which is an excellent resolution of any apnea.  She is 100% compliant for 30 out of 30 days of use and 97% compliance with 4 hours of use.  She had only 1 day where she did not use 4 hours consecutively.  The average user time is 8 hours and 3 minutes at night there are very minor air leaks overall this is very effective therapy but the past to my concern and the patient's not helped to make her less sleepy in daytime.  She remains on [name] with an Epworth Sleepiness Scale result of 12 points today. There has been no interval medical history-  She takes 20 mg [name] and 10 mg in PM.  She is a very light sleeper.  She feels that CPAP prevents her from waking up as frequently. She averages 8- 10 hours of sleep. She takes an iron supplement- her % saturation was only 10% in 10-2019.  Component 2 yr ago  DQA1*01:02 Positive   DQB1*06:02 Positive   Comment: Final Results:  DQA1*01:AXZVC,03:RV    MSLT may be next step now- she can wean off medication that depresses REM sleep.  We need to wean off [name], [name], [name],   In preparation for an MSLT during the summer break I would like for the patient to temporarily take [name] and extended release so that she can use it on a as needed basis and it would be sufficient for a valid MSLT to discontinue the use 8 days prior to the sleep test.  As to [name] the patient only uses 5 mg which is a health tablet at her currently prescribed dose of 10 mg tablets.  She can go to every other day 5 mg for 1 week and then should be able to stay off [name] for another 10 days prior to test.  It is very important that we schedule the MSLT so that these steps can be planned correctly in advance, I have to speak to Dr. Robina Ade  about the [name] and how to best be met and not as familiar with the medication and its half-life.  Since it is also a tablet it should be possible to cut the dose in half and go to every other day but I will confirm with Dr. Buddy Duty that this is what she permits me to do.  My goal is to have the MSLT and early mid June.  So that we have time to wean during the school summer break.    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  Mrs. Portlock underwent a sleep study on  19 January 2018 at the time she was treated with Xanax, Pristiq, Latuda Pravachol and Synthroid.  She also had a BMI of 37 and an Epworth sleepiness scale was endorsed at 11 points.  She experienced temporary spells of weakness and daily hypersomnia.  Her AHI was 3.1/h  the REM AHI was 7.5 usually these levels of sleep apnea are not considered clinically significant.  However there was a quite loud primary snoring noted and the patient still had insomnia and hypersomnia concerns.  The patient decided to try out CPAP, and she has been compliant she has been using the machine 27 out of 30 days with a 76% compliance for hours average user time 5 hours 33 minutes AutoSet is between 5 and 12 cmH2O with 3 cm EPR and her AHI was reduced to 0.3 which is a further successful reduction of an already mild apnea it did not have any effect on her sleepiness.  A repeat laboratory test for review with lymphocytic antigen HLA DQ A1 and DQ B1 was obtained on 13 January by [facility] and she to turn positive for both alleles alleles.  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   We discuss the process how to obtain a valid MSLT ;she ried to wean off latuda and failed, she couldn't work , not able to function. She is not sure she likes her CPAP and given the w baseline AHI I am not convinced it can change much. However, the diagnosis of OSA can help [place] to start her on modafinil.  She has been using [name] with Dr Malachy Moan order, she has seen Vicie Mutters, PA.   I will order modafinil or Armodafinil. I will establish a weaning schedule for her REM supressant medications. If she cannot use CPAP 75-80% of the time compliantly , it is not worth to continue.    Dr. Jannifer Franklin patient ,  I had the pleasure of meeting with [facility] before about 6-1/2 years ago she had undergone a sleep study at [facility] sleep.  She has been followed by Dr. Jannifer Franklin at the time who also ordered a sleep study.  In the meantime she has been followed by her primary care physician, and has seen Dr. Adele Schilder.  She has a diagnosis of hypothyroidism, fibromyalgia, excessive fatigue.  She reports that she had been under more stress at work and at home.  The patient is also a mother of 2 with her youngest son will be 31 years old.  After the birth of either she experienced temporary spells of weakness.  With each of the deliveries she had received magnesium, which can be the most likely cause for perinatal weakness.  Chief complaint according to patient : "depression, leaden fatigue, weight gain, and myalgia" I sometimes feel it coming down on me and I just need to sleep, have taken 2 naps while waiting here."  Sleep habits are as follows: The patient usually goes to bed around 8:30 PM, she takes Xanax at bedtime and is promptly asleep within 20-30 minutes.  The bedroom is cool, quiet and dark.  She prefers to sleep on her side or prone , and sleeps on one pillow.  I sleeps through the night, wakes up at 6 AM, on weekends she stays much longer in bed -until 10 AM. She estimates 9 hours of sleep on average. She wakes without headaches or dry mouth. She dreams a lot, has one or none bathroom break. Her husband noted her just to have  started to snore, and suspected apnea.  She naps frequently in daytime. She works as a Pharmacist, hospital in [employer]. Her naps are 3-5 minutes long.  Excessive daytime sleepiness and fatigue, 3-5 minute power naps, wakes up from  her own snoring. Sleep paralysis in the past, none recent. Immediate dreams when napping. She cannot recall cataplectic spells.    Sleep medical history and family sleep history:  Excessive daytime sleepiness and fatigue, 3-5 minute power naps, wakes up from her own snoring. Mother is sleepy, sons are not.   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 Daughters  age 50  and 67. No smoking history, No ETOH, caffeine - two a day.   Review of Systems: Out of a complete 14 system review, the patient complains of only the following symptoms, and all other reviewed systems are negative.  snoring, weight gain, fatigued.   Epworth score  12 on adderall- - but with naps and sleep attacks.  , Fatigue severity score 60  , depression score n/a -   How likely are you to doze in the following situations: 0 = not likely, 1 = slight chance, 2 = moderate chance, 3 = high chance  Sitting and Reading? Watching Television? Sitting inactive in a public place (theater or meeting)? Lying down in the afternoon when circumstances permit? Sitting and talking to someone? Sitting quietly after lunch without alcohol? In a car, while stopped for a few minutes in traffic? As a passenger in a car for an hour without a break?  Total = 15/ 24 without [name] and   9 points on [name] if not allowed to nap! Will need to look next time at Epworth score in reflection of [name].       Social History   Socioeconomic History  . Marital status: Married    Spouse name: Not on file  . Number of children: 2  . Years of education: Not on file  . Highest education level: Not on file  Occupational History  . Occupation: Product manager: [employer]  Tobacco Use  . Smoking status: Never Smoker  . Smokeless tobacco: Never Used  Vaping Use  . Vaping Use: Never used  Substance and Sexual Activity  . Alcohol use: Yes    Alcohol/week: 0.0 standard drinks    Comment: occ  . Drug use: No  .  Sexual activity: Yes    Partners: Male    Birth control/protection: None, Surgical    Comment: husband has had a vasectomy  Other Topics Concern  . Not on file  Social History Narrative   4 caffeine drinks daily    Social Determinants of Health   Financial Resource Strain: Not on file  Food Insecurity: Not on file  Transportation Needs: Not on file  Physical Activity: Not on file  Stress: Not on file  Social Connections: Not on file  Intimate Partner Violence: Not on file    Family History  Problem Relation Age of Onset  . Hypothyroidism Mother   . Cervical cancer Mother   . Bipolar disorder Mother   . Diabetes Mother   . Colon polyps Mother   . Bipolar disorder Brother   . Kidney disease Brother   . Alcohol abuse Brother   . Hypothyroidism Daughter   . Heart disease Maternal Grandmother   . Diabetes Maternal Grandmother   . Cancer Maternal Grandmother        SKIN  . Hyperlipidemia Maternal Grandmother   .  Hypertension Maternal Grandmother   . Hypothyroidism Maternal Grandmother   . Lung cancer Maternal Grandfather   . Heart disease Maternal Aunt   . Diabetes Maternal Aunt   . Diabetes Cousin   . Colon cancer Neg Hx     Past Medical History:  Diagnosis Date  . Anemia   . Anxiety   . Constipation   . Depression   . Fibromyalgia   . GERD (gastroesophageal reflux disease)   . Hemorrhoids   . HSV-1 infection   . Hypertension   . Hypothyroidism   . OSA on CPAP   . Other abnormal glucose   . Preeclampsia     Past Surgical History:  Procedure Laterality Date  . CARPAL TUNNEL RELEASE Right 02/2020  . CHOLECYSTECTOMY  2006  . TONSILLECTOMY    . WISDOM TOOTH EXTRACTION      Current Outpatient Medications  Medication Sig Dispense Refill  . ALPRAZolam (XANAX) 0.5 MG tablet Take 1/2 - 1 tablet      At Bedtime      ONLY if needed for Sleep    &  limit to 5 days /week to avoid Addiction & Dementia 30 tablet 0  . amphetamine-dextroamphetamine ([name]) 20  MG 24 hr capsule Take 20 mg by mouth daily before lunch. 11:40 am    . amphetamine-dextroamphetamine ([name]) 30 MG 24 hr capsule Take 30 mg by mouth every morning.    . ARIPiprazole ([name]) 10 MG tablet TAKE 1/2 TABLET (5MG) BY MOUTH DAILY 30 tablet 2  . Cholecalciferol (VITAMIN D3) 125 MCG (5000 UT) CAPS Take 1 capsule by mouth daily.    . Iron-Vitamin C 65-125 MG TABS Take by mouth daily.    Marland Kitchen L-Methylfolate-Algae (DEPLIN 15 PO) Take by mouth.    . lithium carbonate (ESKALITH) 450 MG CR tablet Take  1 tablet  2 x /day - Stabilize Mood 180 tablet 0  . pantoprazole (PROTONIX) 40 MG tablet TAKE 1 TABLET BY MOUTH EVERY DAY 30 tablet 1  . rosuvastatin (CRESTOR) 5 MG tablet TAKE 1 TABLET BY MOUTH EVERY DAY FOR CHOLESTEROL 90 tablet 0  . Solriamfetol HCl ([name]) 150 MG TABS Take 150 mg by mouth every morning. 30 tablet 5  . SYNTHROID 200 MCG tablet Take  1 tablet  Daily  on an empty stomach with only water for 30 minutes & no Antacid meds, Calcium or Magnesium for 4 hours & avoid Biotin 90 tablet 0  . vitamin E 100 UNIT capsule Take by mouth daily. Takes 159m    . vortioxetine HBr ([name]) 20 MG TABS tablet Take 20 mg by mouth daily.     No current facility-administered medications for this visit.    Allergies as of 01/12/2021 - Review Complete 01/12/2021  Allergen Reaction Noted  . Lamictal [lamotrigine] Rash 02/11/2016  . Doxycycline Other (See Comments) 09/19/2013  . Effexor [venlafaxine] Other (See Comments) 09/19/2013  . Savella [milnacipran hcl] Other (See Comments) 09/19/2013  . Prilosec [omeprazole] Itching 11/21/2019    Vitals: BP (!) 140/97   Pulse (!) 102   Ht _0  (1.549 m)   Wt 199 lb (90.3 kg)   BMI 37.60 kg/m  Last Weight:  Wt Readings from Last 1 Encounters:  01/12/21 199 lb (90.3 kg)   BMOL:MBEMmass index is 37.6 kg/m.     Last Height:   Ht Readings from Last 1 Encounters:  01/12/21 _1  (1.549 m)    Physical exam:  General: The patient is  awake, alert  and appears in some distress. The patient is well groomed. Head: Normocephalic, atraumatic.  Neck is supple, ROM intact, no dizziness with these movements. . Mallampati 3 and pale mucosa. ,  neck circumference: 15,5 "  Nasal airflow congested , Retrognathia is not seen.  She wore braces and retainer, now bruxism mouth guard.  Cardiovascular:  Regular rate and rhythm, without  murmurs or carotid bruit, and without distended neck veins. Respiratory: Lungs are clear to auscultation. Skin:  Without evidence of edema, or rash Trunk: BMI is 37 .2 Neurologic exam : The patient is awake and alert, oriented to place and time.   Attention span & concentration ability appears normal.  Speech is slowed but  fluent, meek-  With some new dysphonia. She reports word-finding problems and mixing up words. Mood and affect are depressed and anxious.   Cranial nerves: Intact sense of taste and smell.  Pupils are equal and briskly reactive to light. EOM intact, no nystagmus no restriction of visual field,/  Facial motor strength is symmetric and tongue and uvula move midline.  Shoulder shrug was symmetrical.  Motor exam:  Normal tone, muscle bulk and symmetric strength in all extremities.good grip strength.  Sensory: vibration intact.  Coordination: Rapid alternating movements in the fingers/hands were normal. Handwriting has changed- she is micrographic and has mild tremor.  Finger-to-nose maneuver  normal without evidence of ataxia, dysmetria or tremor. Slowed.  Gait and station: Patient walks without assistive device.  The patient is able to rise from her chair without assistance, she did use her arms to brace herself.  She was able to walk up normal speed by not performing any specific specific maneuver, her tandem gait was slower and she walks with outstretched arms to balance herself.  She truly had some difficulties with heel and toe walk more with heel walk.  Her Romberg was positive and she  sway back and forth, but her eyes are closed she seem to have extremities to pro- pulse, to  fall forward.  Deep tendon reflexes: in the  upper and lower extremities are  Intact, 1 plus.  Babinski down going, tight achilles tendon noted.   Assessment:  After physical and neurologic examination, review of laboratory studies,  Personal review of imaging studies, reports of other /same  Imaging studies, results of polysomnography and / or neurophysiology testing and pre-existing records as far as provided in visit., my assessment is ;   1) UARS snoring, mild OSA -Mrs. Swafford has noted more sleep continuity on CPAP.  Her residal AHI  is excellent . Her sleepiness didn't change . Again she is highly compliant has used the CPAP at 90%, with an average daily use at time of 6 hours 28 minutes the settings are between 5 and 12 cmH2O with 3 cm EPR the residual AHI AHI is 0.1/h which speaks for an excellent resolution of apnea pressure at the 95th percentile is 7.7 cm water.  She sometimes has used the machine a little less on weekends than on weekdays.  Overall compliance is sufficient.  Epworth Sleepiness Scale remains at 16 points fatigue severity scale remains at 61 points.    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  Her last  MSLT had no REM sleep onsets in 5 naps.  Ideopathic Hypersomnia?  I suspect she can convert to narcolepsy any time. [name] has helped with managing the sleepiness but she is extremely fatigued.  [name] did not help at 10 mg, and increased step by step to 50 mg a day. Dr Toy Care has titrated.      2) spells of vertigo, dizziness, no falls. MRI brain was normal , c/s contrast agent . No incontinence , no LOC,  But neck pain and HTN . This are possible clues for a cervical spine involvement?   3) appears depressed. May explain the fatigue.  .     I spent more than 35  minutes of face to face time  and preparation time with the patient.  Greater than 50% of time was spent in counseling and coordination of care. We have discussed the diagnosis and differential and I answered the patient's questions.    Plan:  Treatment plan and additional workup :  I like to Get [name] on board. I spoke to patient and her husband in detail. He is not sure he likes his wife so sedated at night. This may help with polypharmacy , too.  MRI cervical spine , with and without contrast.  Telephone RV in 4-6 weeks for check up and possible change to XYWAV .  As discussed, [name]/ Donney Rankins has to be taken with very mindful caution: Taking [name]/ Xywav  correctly is key. This means, take it only when you are fully ready to fall asleep, while in bed and refrain from doing any other activities, even brushing  your teeth after taking your first dose. The second dose will be about 2-1/2-4 hours after his first dose.  You can go to the bathroom before your 2nd dose. Take your first dose, when actually IN BED, ready to sleep.  No sitting up in bed, NO reading, NO using the cell phone or computer, NO getting up to use the bathroom. Take care of everything BEFORE sleep time. Try NOT to skip the second dose as the [name]/ Donney Rankins is not going to stay in your system long enough with only one dose. Do not drink alcohol with [name]/ Donney Rankins . If you do drink Alcohol, you cannot take your [name]/ Xywav doses that night.    Larey Seat, MD 9/53/6922, 30:09 AM  Certified in Neurology by ABPN Certified in [place] by [facility] [address] [place], [place] [address]

## 2021-01-12 NOTE — Telephone Encounter (Signed)
PA submitted through [employer]/ [employer] [place] KEY: [id number] Awaiting question portion of PA

## 2021-01-13 ENCOUNTER — Telehealth: Payer: Self-pay | Admitting: Neurology

## 2021-01-13 NOTE — Telephone Encounter (Signed)
30 minutes, mri cervical wo contrast Dr. Thomasene Lot YPPJ#[id number] exp. 01/13/21-07/11/21 Scheduled at [facility] 3/2

## 2021-01-14 NOTE — Telephone Encounter (Signed)
Received the questions to complete to finalize the PA. Questions answered and sent to the patient's plan. Can take up to 5 days before receiving a response. KEY:[id number]

## 2021-01-14 NOTE — Telephone Encounter (Signed)
PA approved for the patient for Xywav 01/14/21-01/14/2022 through [place] [employer]/[facility]

## 2021-01-18 ENCOUNTER — Telehealth: Payer: Self-pay | Admitting: Neurology

## 2021-01-18 ENCOUNTER — Encounter: Payer: Self-pay | Admitting: Neurology

## 2021-01-18 NOTE — Telephone Encounter (Signed)
Irving Burton from [facility] states they need approval for drug interactions.  Best contact:   [phone]   opt. 3/opt. 4

## 2021-01-18 NOTE — Telephone Encounter (Signed)
Called the [facility] pharmacy back.  They wanted to make sure Dr. Vickey Huger was aware that patient takes Xanax and is okay with her taking that along with xywav. Advised she is aware of her on the medication. *They wanted to make sure Dr. Vickey Huger was aware of the patient's history with depression and suicidal ideations.  The suicidal ideations were night.  And she has not had any since.  Advised that Dr. Vickey Huger is aware of her history and is okay with her proceeding forward with the treatment. *Lastly they wanted to make sure she was aware of her sleep apnea and was okay taking the medication since the medicine can make sleep apnea worse.  Advised the patient is a compliant CPAP user on auto CPAP so it has the capability of adjusting to treat your apnea if it does worsen. Pharmacist was appreciated for the call back and answer the questions and will document on their end.

## 2021-01-20 ENCOUNTER — Ambulatory Visit: Payer: BC Managed Care – PPO

## 2021-01-20 ENCOUNTER — Other Ambulatory Visit: Payer: Self-pay

## 2021-01-20 ENCOUNTER — Encounter: Payer: Self-pay | Admitting: Neurology

## 2021-01-20 DIAGNOSIS — M542 Cervicalgia: Secondary | ICD-10-CM | POA: Diagnosis not present

## 2021-01-21 NOTE — Telephone Encounter (Signed)
All normal responses. Great- cd

## 2021-01-25 ENCOUNTER — Encounter: Payer: Self-pay | Admitting: Neurology

## 2021-01-25 NOTE — Progress Notes (Signed)
FINDINGS:   On sagittal views the vertebral bodies have normal height and alignment.  The spinal cord is normal in size and appearance. The posterior fossa, pituitary gland and paraspinal soft tissues are unremarkable.    On axial views there is no spinal stenosis or foraminal narrowing. Limited views of the soft tissues of the head and neck are unremarkable.   IMPRESSION:   Normal MRI cervical spine (without).    INTERPRETING PHYSICIAN:  Suanne Marker, MD Certified in Neurology, Neurophysiology and Neuroimaging

## 2021-01-27 ENCOUNTER — Ambulatory Visit (INDEPENDENT_AMBULATORY_CARE_PROVIDER_SITE_OTHER): Payer: BC Managed Care – PPO | Admitting: Adult Health Nurse Practitioner

## 2021-01-27 ENCOUNTER — Encounter: Payer: Self-pay | Admitting: Adult Health Nurse Practitioner

## 2021-01-27 ENCOUNTER — Other Ambulatory Visit: Payer: Self-pay

## 2021-01-27 VITALS — BP 128/88 | HR 68 | Temp 97.3°F | Ht 62.0 in | Wt 198.0 lb

## 2021-01-27 DIAGNOSIS — Z1321 Encounter for screening for nutritional disorder: Secondary | ICD-10-CM

## 2021-01-27 DIAGNOSIS — E559 Vitamin D deficiency, unspecified: Secondary | ICD-10-CM

## 2021-01-27 DIAGNOSIS — R300 Dysuria: Secondary | ICD-10-CM

## 2021-01-27 DIAGNOSIS — G47419 Narcolepsy without cataplexy: Secondary | ICD-10-CM

## 2021-01-27 DIAGNOSIS — M797 Fibromyalgia: Secondary | ICD-10-CM | POA: Diagnosis not present

## 2021-01-27 DIAGNOSIS — Z1389 Encounter for screening for other disorder: Secondary | ICD-10-CM

## 2021-01-27 DIAGNOSIS — E782 Mixed hyperlipidemia: Secondary | ICD-10-CM

## 2021-01-27 DIAGNOSIS — E039 Hypothyroidism, unspecified: Secondary | ICD-10-CM

## 2021-01-27 DIAGNOSIS — K582 Mixed irritable bowel syndrome: Secondary | ICD-10-CM

## 2021-01-27 DIAGNOSIS — F319 Bipolar disorder, unspecified: Secondary | ICD-10-CM

## 2021-01-27 DIAGNOSIS — I10 Essential (primary) hypertension: Secondary | ICD-10-CM

## 2021-01-27 DIAGNOSIS — M25511 Pain in right shoulder: Secondary | ICD-10-CM

## 2021-01-27 DIAGNOSIS — R7309 Other abnormal glucose: Secondary | ICD-10-CM

## 2021-01-27 DIAGNOSIS — Z79899 Other long term (current) drug therapy: Secondary | ICD-10-CM

## 2021-01-27 DIAGNOSIS — Z136 Encounter for screening for cardiovascular disorders: Secondary | ICD-10-CM

## 2021-01-27 DIAGNOSIS — F3341 Major depressive disorder, recurrent, in partial remission: Secondary | ICD-10-CM

## 2021-01-27 MED ORDER — MELOXICAM 15 MG PO TABS: 15.0000 mg | ORAL_TABLET | Freq: Every day | ORAL | 0 refills | Status: DC

## 2021-01-27 NOTE — Progress Notes (Signed)
FOLLOW UP  Assessment and Plan:   Lauren [name] was seen today for 26ms meds- follow up.  Diagnoses and all orders for this visit:  Primary narcolepsy without cataplexy Follows with Dr DBrett Fairy  Fibromyalgia Monitor  Essential hypertension Controlled today Monitor blood pressure at home; call if consistently over 130/80 Continue DASH diet.   Reminder to go to the ER if any CP, SOB, nausea, dizziness, severe HA, changes vision/speech, left arm numbness and tingling and jaw pain. -     CBC with Differential/Platelet -     COMPLETE METABOLIC PANEL WITH GFR -     TSH  Abnormal glucose .plankmdee -     Hemoglobin A1c  Hypothyroidism, unspecified type Taking levothyroxine 200 mcg daily Reminder to take on an empty stomach 30-628ms before first meal of the day. No antacid medications for 4 hours. -     TSH  Vitamin D deficiency Continue supplementation to maintain goal of 70-100 Taking Vitamin D 5,000 IU daily  Recurrent major depressive disorder, in partial remission ([name] Discussed dietary and exercise modifications  Irritable bowel syndrome with both constipation and diarrhea Doing well at this time  Hyperlipidemia, mixed Continue medications:  Discussed dietary and exercise modifications Low fat diet   Medication management Continued  Screening for blood or protein in urine -     Urinalysis w microscopic + reflex cultur  Screening, ischemic heart disease  Encounter for vitamin deficiency screening -     Vitamin B12  Acute pain of right shoulder -     meloxicam (MOBIC) 15 MG tablet; Take 1 tablet (15 mg total) by mouth daily.  Dysuria -     Urinalysis w microscopic + reflex cultur   Continue diet and meds as discussed. Further disposition pending results of labs. Discussed med's effects and SE's.   Over 30 minutes of exam, counseling, chart review, and critical decision making  was performed.   Future Appointments  Date Time Provider De[place]6/06/2021  9:30 AM Lomax, Amy, NP [facility] None    ----------------------------------------------------------------------------------------------------------------------  HPI [age].o. female  presents for 3 month follow up on HTN, HLD, prediabetes, morbid obesity, hypothyroidism and vitamin D deficiency.    Last OV was 01/06/21 for evaluation of malaise, fatigue.  Assessment and labs unremarkable. She saw neurology 01/12/21 for evaluation, MRI was unremarkable.  [name]/Xywav medication started.  She reports that she increased the medication last night.  She reports she sleeping well and daytime fatigue has decreased on this dose.  Reports she could go up two more times and thinks this is a good dose for her.  She has follow up scheduled in several months.  She is checking in each week via MyChart.  Overall improvement since last OV.  She reports she has some shoulder pain on the left, she reports it has been going on for months but it has gradually increased in pain and now has noticable edema.  She reports pain increases when laying on on the affected side.  She reports that she has not done any interventions or taken anything for this.     Has history of neuromuscular sx without conclusive dx (? Presumed fibromyalgia), Has had extensive lab workup, B12, copper, burgdorfi abi, rocky mtn spotted fever, negative ANA, anti- DNA. In last year had normal TSH, epstein barr, serum protein elctrophoresis, TSH, CBC, CMP, UA, B12, HIV, LHA-B27, RNP antibody, RF, comprehensive drug panel, neg  MG.   She will have blurry vision occ, normally does not get headaches. She did have episodes of feel like her left face was heavy. She will feel body twitches. No personal history of migraines/no family history of migraines. No changes with this.  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  She is back on abilify 10 mg and trintellix 20 mg daily and adderall- started back in beginning of July. She is also taking lithium and managed by Dr Toy Care.   BMI is Body mass index is 36.21 kg/m., she has not been working on diet and exercise. Wt Readings from Last 3 Encounters:  01/27/21 198 lb (89.8 kg)  01/12/21 199 lb (90.3 kg)  01/06/21 198 lb 3.2 oz (89.9 kg)   Her blood pressure has been controlled at home, today their BP is BP: 128/88  She does not workout. She denies chest pain, shortness of breath, dizziness.   She is on cholesterol medication Rosuvastatin 5 mg daily and denies myalgias. Her LDL cholesterol is at goal. The cholesterol last visit was:   Lab Results  Component Value Date   CHOL 170 10/29/2020   HDL 58 10/29/2020   LDLCALC 88 10/29/2020   LDLDIRECT 114.9 04/30/2012   TRIG 139 10/29/2020   CHOLHDL 2.9 10/29/2020   Lab Results  Component Value Date   CKTOTAL 41 07/01/2020    She has been working on diet and exercise for prediabetes, and denies increased appetite, nausea, paresthesia of the feet, polydipsia, polyuria and visual disturbances. Last A1C in the office was:  Lab Results  Component Value Date   HGBA1C 6.0 (H) 10/29/2020   She is on thyroid medication. Her medication was changed last visit.  Currently taking 241m, one tablet daily. Lab Results  Component Value Date   TSH 7.10 (H) 12/31/2020   Patient is on Vitamin D supplement.   Lab Results  Component Value Date   VD25OH 67 10/29/2020        Current Medications:  Current Outpatient Medications on File Prior to Visit  Medication Sig  . amphetamine-dextroamphetamine ([name]) 20 MG 24 hr capsule Take 20 mg by mouth daily before lunch. 11:40 am  . amphetamine-dextroamphetamine ([name]) 30 MG 24 hr capsule Take 30 mg by mouth every morning.  . ARIPiprazole ([name]) 10 MG tablet TAKE 1/2 TABLET (5MG) BY MOUTH DAILY  . Ca, Mg, K,  and Na Oxybates (XYWAV) 500 MG/ML SOLN Take 2.75 g by mouth at bedtime and may repeat dose one time if needed for 8 days, THEN 3.25 g at bedtime and may repeat dose one time if needed for 8 days, THEN 4.5 g at bedtime and may repeat dose one time if needed for 8 days. Twice in bed _ po..  . Cholecalciferol (VITAMIN D3) 125 MCG (5000 UT) CAPS Take 1 capsule by mouth daily.  . Iron-Vitamin C 65-125 MG TABS Take by mouth daily.  .Marland KitchenL-Methylfolate-Algae (DEPLIN 15 PO) Take by mouth.  . lithium carbonate (ESKALITH) 450 MG CR tablet Take  1 tablet  2 x /day - Stabilize Mood  . pantoprazole (PROTONIX) 40 MG tablet TAKE 1 TABLET BY MOUTH EVERY DAY  . rosuvastatin (CRESTOR) 5 MG tablet TAKE 1 TABLET BY MOUTH EVERY DAY FOR CHOLESTEROL  . SYNTHROID 200 MCG tablet Take  1 tablet  Daily  on an empty stomach with only water for 30 minutes &  no Antacid meds, Calcium or Magnesium for 4 hours & avoid Biotin  . vitamin E 100 UNIT capsule Take by mouth daily. Takes 157m  . vortioxetine HBr ([name]) 20 MG TABS tablet Take 20 mg by mouth daily.   No current facility-administered medications on file prior to visit.     Allergies:  Allergies  Allergen Reactions  . Lamictal [Lamotrigine] Rash  . Doxycycline Other (See Comments)    GI UPSET  . Effexor [Venlafaxine] Other (See Comments)    DYSPHORIA  . Savella [Milnacipran Hcl] Other (See Comments)    NO RELIEF  . Prilosec [Omeprazole] Itching     Medical History:  Past Medical History:  Diagnosis Date  . Anemia   . Anxiety   . Constipation   . Depression   . Fibromyalgia   . GERD (gastroesophageal reflux disease)   . Hemorrhoids   . HSV-1 infection   . Hypertension   . Hypothyroidism   . OSA on CPAP   . Other abnormal glucose   . Preeclampsia    Family history- Reviewed and unchanged Social history- Reviewed and unchanged   Review of Systems:  Review of Systems  Constitutional: Negative for chills, fever, malaise/fatigue and weight  loss.  HENT: Negative for ear discharge, ear pain, hearing loss and tinnitus.   Eyes: Negative for blurred vision and double vision.  Respiratory: Negative for cough, hemoptysis, sputum production, shortness of breath and wheezing.   Cardiovascular: Negative for chest pain, palpitations, orthopnea, claudication and leg swelling.  Gastrointestinal: Negative for abdominal pain, blood in stool, constipation, diarrhea, heartburn, melena, nausea and vomiting.  Genitourinary: Positive for dysuria. Negative for flank pain, frequency, hematuria and urgency.  Musculoskeletal: Positive for joint pain. Negative for back pain, falls, myalgias and neck pain.  Skin: Negative for rash.  Neurological: Negative for dizziness, tingling, sensory change, weakness and headaches.  Endo/Heme/Allergies: Negative for polydipsia.  Psychiatric/Behavioral: Negative for depression, substance abuse and suicidal ideas. The patient is not nervous/anxious and does not have insomnia.   All other systems reviewed and are negative.    Physical Exam: BP 128/88   Pulse 68   Temp (!) 97.3 F (36.3 C)   Ht _0  (1.575 m)   Wt 198 lb (89.8 kg)   SpO2 100%   BMI 36.21 kg/m  Wt Readings from Last 3 Encounters:  01/27/21 198 lb (89.8 kg)  01/12/21 199 lb (90.3 kg)  01/06/21 198 lb 3.2 oz (89.9 kg)   General Appearance: Well nourished, in no apparent distress. Eyes: PERRLA, EOMs, conjunctiva no swelling or erythema Sinuses: No Frontal/maxillary tenderness ENT/Mouth: R Ext aud canals clear, TM without erythema, bulging. L ext aud canal inflamed/narrowed, mildly erythematous, tragus tenderness, no mastoid bogginess, no discharge. No erythema, swelling, or exudate on post pharynx.  Tonsils not swollen or erythematous. Hearing normal.  Neck: Supple, thyroid normal.  Respiratory: Respiratory effort normal, BS equal bilaterally without rales, rhonchi, wheezing or stridor.  Cardio: RRR with no MRGs. Brisk peripheral pulses without  edema.  Abdomen: Soft, + BS.  Non tender, no guarding, rebound, hernias, masses. Lymphatics: Non tender without lymphadenopathy.  Musculoskeletal: Full ROM, 5/5 strength, Normal gait.  Left shoulder point tenderness to subscapularis, decreased extension. Skin: Warm, dry without rashes, lesions, ecchymosis.  Neuro: Cranial nerves intact. No cerebellar symptoms.  Psych: Awake and oriented X 3, normal affect, Insight and Judgment appropriate.     KGarnet Sierras NP 5:17 PM [facility]

## 2021-01-28 ENCOUNTER — Encounter: Payer: Self-pay | Admitting: Neurology

## 2021-01-28 LAB — CBC WITH DIFFERENTIAL/PLATELET
Absolute Monocytes: 548 cells/uL (ref 200–950)
Basophils Absolute: 17 cells/uL (ref 0–200)
Basophils Relative: 0.2 %
Eosinophils Absolute: 52 cells/uL (ref 15–500)
Eosinophils Relative: 0.6 %
HCT: 40.7 % (ref 35.0–45.0)
Hemoglobin: 13.7 g/dL (ref 11.7–15.5)
Lymphs Abs: 2288 cells/uL (ref 850–3900)
MCH: 29.8 pg (ref 27.0–33.0)
MCHC: 33.7 g/dL (ref 32.0–36.0)
MCV: 88.7 fL (ref 80.0–100.0)
MPV: 12 fL (ref 7.5–12.5)
Monocytes Relative: 6.3 %
Neutro Abs: 5794 cells/uL (ref 1500–7800)
Neutrophils Relative %: 66.6 %
Platelets: 184 10*3/uL (ref 140–400)
RBC: 4.59 10*6/uL (ref 3.80–5.10)
RDW: 12 % (ref 11.0–15.0)
Total Lymphocyte: 26.3 %
WBC: 8.7 10*3/uL (ref 3.8–10.8)

## 2021-01-28 LAB — COMPLETE METABOLIC PANEL WITH GFR
AG Ratio: 1.8 (calc) (ref 1.0–2.5)
ALT: 27 U/L (ref 6–29)
AST: 21 U/L (ref 10–30)
Albumin: 4.6 g/dL (ref 3.6–5.1)
Alkaline phosphatase (APISO): 55 U/L (ref 31–125)
BUN: 9 mg/dL (ref 7–25)
CO2: 26 mmol/L (ref 20–32)
Calcium: 9.2 mg/dL (ref 8.6–10.2)
Chloride: 105 mmol/L (ref 98–110)
Creat: 0.69 mg/dL (ref 0.50–1.10)
GFR, Est African American: 124 mL/min/{1.73_m2} (ref >=60)
GFR, Est Non African American: 107 mL/min/{1.73_m2} (ref >=60)
Globulin: 2.5 g/dL (calc) (ref 1.9–3.7)
Glucose, Bld: 106 mg/dL — ABNORMAL HIGH (ref 65–99)
Potassium: 4 mmol/L (ref 3.5–5.3)
Sodium: 141 mmol/L (ref 135–146)
Total Bilirubin: 0.5 mg/dL (ref 0.2–1.2)
Total Protein: 7.1 g/dL (ref 6.1–8.1)

## 2021-01-28 LAB — HEMOGLOBIN A1C
Hgb A1c MFr Bld: 5.9 % of total Hgb — ABNORMAL HIGH (ref <5.7)
Mean Plasma Glucose: 123 mg/dL
eAG (mmol/L): 6.8 mmol/L

## 2021-01-28 LAB — TSH: TSH: 0.11 mIU/L — ABNORMAL LOW

## 2021-01-28 LAB — VITAMIN B12: Vitamin B-12: 508 pg/mL (ref 200–1100)

## 2021-01-29 NOTE — Progress Notes (Signed)
Blood count, B12 are in normal range  Thyroid, TSH is low.  Change dose to levothyroxine whole tablet 6 days a week and half tablet one day a week.  A1c decreased from 6 to 5.9, pre-diabetes range.  Continue to work on decreasing carbohydrates, white bread, rice, pasta and white potatoes and sugars.  Sincerely,           Lauren Negus, NP

## 2021-01-30 LAB — URINALYSIS W MICROSCOPIC + REFLEX CULTURE
Bacteria, UA: NONE SEEN /HPF
Bilirubin Urine: NEGATIVE
Glucose, UA: NEGATIVE
Hyaline Cast: NONE SEEN /LPF
Ketones, ur: NEGATIVE
Nitrites, Initial: NEGATIVE
Protein, ur: NEGATIVE
Specific Gravity, Urine: 1.024 (ref 1.001–1.03)
pH: <=5 (ref 5.0–8.0)

## 2021-01-30 LAB — URINE CULTURE
MICRO NUMBER:: 11633787
SPECIMEN QUALITY:: ADEQUATE

## 2021-01-30 LAB — CULTURE INDICATED

## 2021-02-01 ENCOUNTER — Ambulatory Visit: Payer: BC Managed Care – PPO | Admitting: Neurology

## 2021-02-08 ENCOUNTER — Other Ambulatory Visit: Payer: Self-pay

## 2021-02-08 DIAGNOSIS — K2100 Gastro-esophageal reflux disease with esophagitis, without bleeding: Secondary | ICD-10-CM

## 2021-02-08 DIAGNOSIS — R110 Nausea: Secondary | ICD-10-CM

## 2021-02-08 MED ORDER — PANTOPRAZOLE SODIUM 40 MG PO TBEC: 40.0000 mg | DELAYED_RELEASE_TABLET | Freq: Every day | ORAL | 1 refills | Status: DC

## 2021-02-08 NOTE — Telephone Encounter (Signed)
Refill on PANTOPRAZOLE  

## 2021-02-09 ENCOUNTER — Ambulatory Visit: Payer: BC Managed Care – PPO | Admitting: Neurology

## 2021-02-09 ENCOUNTER — Telehealth: Payer: Self-pay

## 2021-02-09 NOTE — Telephone Encounter (Signed)
Message has been forwarded to the patient for her review

## 2021-02-09 NOTE — Telephone Encounter (Signed)
-----   Message from Elder Negus, NP sent at 02/09/2021  1:08 PM EDT ----- Regarding: RE: front office phone message Prednisone can weaken your immune system.  Is this from Orthopedics for thumb?  Be sure you are drinking enough fluids.  Can take acetaminophen for elevated temp.   Sincerely,           Elder Negus, NP    ----- Message ----- From: Gregery Na, CMA Sent: 02/09/2021  11:18 AM EDT To: Elder Negus, NP Subject: front office phone message                     Started PREDNISONE last week & now has a fever 101, dizzy, weakness in both legs.    Please advise

## 2021-03-09 ENCOUNTER — Other Ambulatory Visit: Payer: Self-pay | Admitting: Neurology

## 2021-03-09 ENCOUNTER — Telehealth: Payer: Self-pay | Admitting: Neurology

## 2021-03-09 DIAGNOSIS — Z79899 Other long term (current) drug therapy: Secondary | ICD-10-CM

## 2021-03-09 DIAGNOSIS — G4710 Hypersomnia, unspecified: Secondary | ICD-10-CM

## 2021-03-09 DIAGNOSIS — G4733 Obstructive sleep apnea (adult) (pediatric): Secondary | ICD-10-CM

## 2021-03-09 NOTE — Telephone Encounter (Signed)
Pt is reaching out to advise that she has been noting that her AHI events are increasing at night. Here is a DL of the last 14 days. Will discuss if Dr. Vickey Huger would like to increase the maximum pressure.

## 2021-03-09 NOTE — Telephone Encounter (Signed)
Spoke with Dr Vickey Huger and she will recommend cpap titration due to the central apnea's emerging under [facility] treatment. I have sent mychart message to the pt informing of this.

## 2021-03-23 ENCOUNTER — Encounter: Payer: Self-pay | Admitting: Neurology

## 2021-03-23 ENCOUNTER — Telehealth: Payer: Self-pay | Admitting: Neurology

## 2021-03-23 ENCOUNTER — Other Ambulatory Visit: Payer: Self-pay | Admitting: Neurology

## 2021-03-23 DIAGNOSIS — Z9989 Dependence on other enabling machines and devices: Secondary | ICD-10-CM

## 2021-03-23 DIAGNOSIS — G4710 Hypersomnia, unspecified: Secondary | ICD-10-CM

## 2021-03-23 DIAGNOSIS — Z79899 Other long term (current) drug therapy: Secondary | ICD-10-CM

## 2021-03-23 DIAGNOSIS — G4733 Obstructive sleep apnea (adult) (pediatric): Secondary | ICD-10-CM

## 2021-03-23 NOTE — Telephone Encounter (Signed)
Pt is on Xywav and tolerating the medication fine. She currently is treated for sleep apnea. Since increasing to the maximum dose she has developed central apnea events. Pt is scheduled to come in for bipap titration 04/02/21. She is writing a Clinical cytogeneticist message to ask if there is something she should do in the meantime to help with decreasing the events. Her husband has witnessed the events and was concerned. Wanted to get your thoughts before I reached back out to the patient.

## 2021-03-23 NOTE — Telephone Encounter (Signed)
I reviewed her compliance data.  We could try her on a set pressure rather than AutoPap, if she is agreeable. Please change her pressure to a set pressure of 8 cm, i.e. CPAP of 8 with EPR of 2.  We can try this until her titration study.  We can review her download next week.

## 2021-03-23 NOTE — Telephone Encounter (Signed)
Patient was agreeable to this plan.  Order has been placed for the patient.  Settings have been changed in [facility] for the patient.  We will send the order to the DME company for them to have on file.

## 2021-03-27 ENCOUNTER — Other Ambulatory Visit: Payer: Self-pay | Admitting: Adult Health

## 2021-04-02 ENCOUNTER — Ambulatory Visit (INDEPENDENT_AMBULATORY_CARE_PROVIDER_SITE_OTHER): Payer: BC Managed Care – PPO | Admitting: Neurology

## 2021-04-02 ENCOUNTER — Other Ambulatory Visit: Payer: Self-pay

## 2021-04-02 DIAGNOSIS — G4739 Other sleep apnea: Secondary | ICD-10-CM

## 2021-04-02 DIAGNOSIS — Z79899 Other long term (current) drug therapy: Secondary | ICD-10-CM

## 2021-04-02 DIAGNOSIS — G4733 Obstructive sleep apnea (adult) (pediatric): Secondary | ICD-10-CM | POA: Diagnosis not present

## 2021-04-02 DIAGNOSIS — Z9989 Dependence on other enabling machines and devices: Secondary | ICD-10-CM

## 2021-04-02 DIAGNOSIS — G4710 Hypersomnia, unspecified: Secondary | ICD-10-CM

## 2021-04-05 ENCOUNTER — Encounter: Payer: Self-pay | Admitting: Neurology

## 2021-04-05 ENCOUNTER — Telehealth: Payer: Self-pay | Admitting: Neurology

## 2021-04-05 NOTE — Telephone Encounter (Signed)
Called the pharmacy back. I was on hold, opt'd to leave a message.  Left a detailed message advising that Dr Vickey Huger is aware that the patient is currently using CPAP. We are aware there have been an increase in apnea events since started [place]. We have brought the patient in for a titration study to get her established on the correct pressure to help with treatment of the apnea events. At this time, Dr Vickey Huger is ok with the patient continuing the medication until we get her on the new machine.  Advised on VM it is ok to send the patient's medication.   **If pharmacist calls back you can reiterate the information above

## 2021-04-05 NOTE — Telephone Encounter (Signed)
Clifton Custard from [employer] called wanting to know if the provider was aware of the side effects that come with the pt taking Xywav her bing on the bipap. He is needing a a call back so they can go ahead and ship the pts medication . Please advise. [phone]

## 2021-04-08 ENCOUNTER — Other Ambulatory Visit: Payer: Self-pay | Admitting: Internal Medicine

## 2021-04-08 ENCOUNTER — Other Ambulatory Visit: Payer: Self-pay | Admitting: Adult Health

## 2021-04-08 DIAGNOSIS — E039 Hypothyroidism, unspecified: Secondary | ICD-10-CM

## 2021-04-09 ENCOUNTER — Other Ambulatory Visit: Payer: Self-pay | Admitting: Internal Medicine

## 2021-04-12 ENCOUNTER — Other Ambulatory Visit: Payer: Self-pay

## 2021-04-12 ENCOUNTER — Other Ambulatory Visit: Payer: BC Managed Care – PPO

## 2021-04-12 DIAGNOSIS — E039 Hypothyroidism, unspecified: Secondary | ICD-10-CM

## 2021-04-13 ENCOUNTER — Other Ambulatory Visit: Payer: Self-pay | Admitting: Adult Health

## 2021-04-13 DIAGNOSIS — E039 Hypothyroidism, unspecified: Secondary | ICD-10-CM

## 2021-04-13 LAB — TSH: TSH: 0.01 mIU/L — ABNORMAL LOW

## 2021-04-13 MED ORDER — SYNTHROID 200 MCG PO TABS: ORAL_TABLET | ORAL | 0 refills | Status: DC

## 2021-04-20 DIAGNOSIS — G4739 Other sleep apnea: Secondary | ICD-10-CM | POA: Insufficient documentation

## 2021-04-20 NOTE — Addendum Note (Signed)
Addended by: Melvyn Novas on: 04/20/2021 05:21 PM   Modules accepted: Orders

## 2021-04-20 NOTE — Procedures (Signed)
PATIENT'S NAME:  Lauren [name], Lauren [name] DOB:      23-Aug-1981      MR#:    [id number]     DATE OF RECORDING: 04/02/2021 Peggye Ley REFERRING M.D.:  Unk Pinto, MD Study Performed:   Titration to positive airway pressure/ BiPAP HISTORY:  The patient has been tested positive for both HLA DQ alleles indicating that she has a genetic marker for narcolepsy.  She endorsed the Epworth Sleepiness Scale between 16 and 14 points at various times, she had no apnea using CPAP in the nightly study REM latency was still 60 minutes to my surprise, the multiple sleep latency test showed sleep onset with an average of 8 minutes which is a central sleep latency than desired however we were again unable to find any REM latencies or REM sleep onset.  Clinically, the patient presents with symptoms that are very reminiscent of narcolepsy and there is a possibility that this is an idiopathic hypersomnia. OSA was found in the last baseline PSG-   The patient had appropriately weaned off all REM suppressing medication prior to the test. There was sleep apnea found and the patient is here to be titrated.   The patient endorsed the Epworth Sleepiness Scale at 15 points.   The patient's weight 199 pounds with a height of 61 (inches), resulting in a BMI of 37.5 kg/m2. The patient's neck circumference measured 15.5 inches.  CURRENT MEDICATIONS: Xanax, [name], [name], Vitamin D3, Iron-Vitamin C, Deplin 15, Eskalith, Protonix, Crestor, [name], Synthroid, Vitamin E, [name]  PROCEDURE:  This is a multichannel digital polysomnogram utilizing the SomnoStar 11.2 system.  Electrodes and sensors were applied and monitored per AASM Specifications.   EEG, EOG, Chin and Limb EMG, were sampled at 200 Hz.  ECG, Snore and Nasal Pressure, Thermal Airflow, Respiratory Effort, CPAP Flow and Pressure, Oximetry was sampled at 50 Hz. Digital video and audio were recorded.       CPAP was initiated at 9 cmH20 with heated humidity per AASM  standards and pressure changed to BiPAP modality at 12/8 cmH20 because of hypopneas, apneas and desaturations.  At a BiPAP pressure of 17/10 cmH20, ST of 10/min., there was a reduction of the AHI to 0 with improvement of sleep apnea over 177 minutes of sleep time. the patient used a small nasal cushion, dreamwear interface.   Lights Out was at 21:08 and Lights On at 04:37. Total recording time (TRT) was 449 minutes, with a total sleep time (TST) of 409.5 minutes. The patient's sleep latency was 25.5 minutes. REM latency was 362 minutes.  The sleep efficiency was 91.2 %.    SLEEP ARCHITECTURE: WASO (Wake after sleep onset) was 14.5 minutes.  There were 4.5 minutes in Stage N1, 190.5 minutes Stage N2, 195 minutes Stage N3 and 19.5 minutes in Stage REM.  The percentage of Stage N1 was 1.1%, Stage N2 was 46.5%, Stage N3 was 47.6% and Stage R (REM sleep) was 4.8%.   RESPIRATORY ANALYSIS:  There was a total of 44 respiratory events: 0 obstructive apneas, 42 central apneas and 2 mixed apneas with a total of 44 apneas and an apnea index (AHI) of 6.4 /hour. There were 0 hypopneas with a hypopnea index of 0/hour.   The total APNEA/HYPOPNEA INDEX (AHI) was 6.4 /hour.  0 events occurred in REM sleep and 44 events in NREM. The REM AHI was 0 /hour versus a non-REM AHI of 6.8 /hour.  The patient spent 406.5 minutes of total sleep time in the supine position  and 3 minutes in non-supine.  The supine AHI was 6.5, versus a non-supine AHI of 0.0.  OXYGEN SATURATION & C02:  The baseline 02 saturation was 98%, with the lowest being 93%. Time spent below 89% saturation equaled 0 minutes.  The arousals were noted as: 14 were spontaneous, 0 were associated with PLMs, 1 were associated with respiratory events. The patient had a total of 0 Periodic Limb Movements.  Audio and video analysis did not show any abnormal or unusual movements, behaviors, phonations or vocalizations.   EKG was in keeping with normal sinus  rhythm.  DIAGNOSIS: CSA worsening under CPAP therapy.  1. Central Sleep Apnea responded finally to BiPAP 17/10 cm water, 10 ST with a small Dreamwear nasal cushion.  2. Prolonged REM sleep latency while on medication.    PLANS/RECOMMENDATIONS: New BiPAP device ordered at a pressure of 17/10 cmH20, ST of 10/min., with a small nasal cushion, dream wear interface.  DISCUSSION: At a BiPAP pressure of 17/10 cmH20, ST of 10/min., there was a reduction of the AHI to 0 with improvement of sleep apnea over 177 minutes of sleep time. the patient used a small nasal cushion, DreamWear interface.  A follow up appointment will be scheduled in the Sleep Clinic at [facility].   Please call [phone] with any questions.      I certify that I have reviewed the entire raw data recording prior to the issuance of this report in accordance with the Standards of Accreditation of the American Academy of Sleep Medicine (AASM)   Larey Seat, M.D. Diplomat, Tax adviser of Psychiatry and Neurology  Diplomat, Tax adviser of Sleep Medicine Market researcher, [employer] [facility] at [employer]

## 2021-04-20 NOTE — Progress Notes (Signed)
DIAGNOSIS: CSA worsening under CPAP therapy.  1. Central Sleep Apnea responded finally to BiPAP 17/10 cm water, 10 ST with a small Dreamwear nasal cushion.  2. Prolonged REM sleep latency while on medication.    PLANS/RECOMMENDATIONS: New BiPAP device ordered at a pressure of 17/10 cmH20, ST of 10/min., with a small nasal cushion, dream wear interface.  DISCUSSION: At a BiPAP pressure of 17/10 cmH20, ST of 10/min., there was a reduction of the AHI to 0 with improvement of sleep apnea over 177 minutes of sleep time. the patient used a small nasal cushion, DreamWear interface.  A follow up appointment will be scheduled in the Sleep Clinic at [facility].   Please call [phone] with any questions.

## 2021-04-21 ENCOUNTER — Encounter: Payer: Self-pay | Admitting: Neurology

## 2021-04-28 ENCOUNTER — Ambulatory Visit: Payer: BC Managed Care – PPO | Admitting: Family Medicine

## 2021-05-15 ENCOUNTER — Encounter: Payer: Self-pay | Admitting: Neurology

## 2021-05-15 ENCOUNTER — Encounter (HOSPITAL_COMMUNITY): Payer: Self-pay | Admitting: *Deleted

## 2021-05-15 ENCOUNTER — Emergency Department (HOSPITAL_COMMUNITY)
Admission: EM | Admit: 2021-05-15 | Discharge: 2021-05-15 | Disposition: A | Discharge status: Home / Self Care | Payer: BC Managed Care – PPO | Attending: Emergency Medicine | Admitting: Emergency Medicine

## 2021-05-15 ENCOUNTER — Other Ambulatory Visit: Payer: Self-pay

## 2021-05-15 ENCOUNTER — Emergency Department (HOSPITAL_COMMUNITY): Payer: BC Managed Care – PPO

## 2021-05-15 DIAGNOSIS — E039 Hypothyroidism, unspecified: Secondary | ICD-10-CM | POA: Diagnosis not present

## 2021-05-15 DIAGNOSIS — Z20822 Contact with and (suspected) exposure to covid-19: Secondary | ICD-10-CM | POA: Diagnosis not present

## 2021-05-15 DIAGNOSIS — Z79899 Other long term (current) drug therapy: Secondary | ICD-10-CM | POA: Diagnosis not present

## 2021-05-15 DIAGNOSIS — R0602 Shortness of breath: Secondary | ICD-10-CM | POA: Insufficient documentation

## 2021-05-15 DIAGNOSIS — N390 Urinary tract infection, site not specified: Secondary | ICD-10-CM | POA: Diagnosis not present

## 2021-05-15 DIAGNOSIS — I10 Essential (primary) hypertension: Secondary | ICD-10-CM | POA: Diagnosis not present

## 2021-05-15 LAB — BASIC METABOLIC PANEL
Anion gap: 11 (ref 5–15)
BUN: 14 mg/dL (ref 6–20)
CO2: 24 mmol/L (ref 22–32)
Calcium: 9 mg/dL (ref 8.9–10.3)
Chloride: 104 mmol/L (ref 98–111)
Creatinine, Ser: 0.74 mg/dL (ref 0.44–1.00)
GFR, Estimated: >60 mL/min (ref >60)
Glucose, Bld: 86 mg/dL (ref 70–99)
Potassium: 3.1 mmol/L — ABNORMAL LOW (ref 3.5–5.1)
Sodium: 139 mmol/L (ref 135–145)

## 2021-05-15 LAB — URINALYSIS, ROUTINE W REFLEX MICROSCOPIC
Bacteria, UA: NONE SEEN
Glucose, UA: NEGATIVE mg/dL
Ketones, ur: NEGATIVE mg/dL
Nitrite: NEGATIVE
Protein, ur: 30 mg/dL — AB
RBC / HPF: >50 RBC/hpf — ABNORMAL HIGH (ref 0–5)
Specific Gravity, Urine: 1.01 (ref 1.005–1.030)
pH: 8.5 — ABNORMAL HIGH (ref 5.0–8.0)

## 2021-05-15 LAB — RESP PANEL BY RT-PCR (FLU A&B, COVID) ARPGX2
Influenza A by PCR: NEGATIVE
Influenza B by PCR: NEGATIVE
SARS Coronavirus 2 by RT PCR: NEGATIVE

## 2021-05-15 LAB — CBC
HCT: 43.8 % (ref 36.0–46.0)
Hemoglobin: 14.6 g/dL (ref 12.0–15.0)
MCH: 28.8 pg (ref 26.0–34.0)
MCHC: 33.3 g/dL (ref 30.0–36.0)
MCV: 86.4 fL (ref 80.0–100.0)
Platelets: 186 10*3/uL (ref 150–400)
RBC: 5.07 MIL/uL (ref 3.87–5.11)
RDW: 12.2 % (ref 11.5–15.5)
WBC: 9.9 10*3/uL (ref 4.0–10.5)
nRBC: 0 % (ref 0.0–0.2)

## 2021-05-15 LAB — POC URINE PREG, ED: Preg Test, Ur: NEGATIVE

## 2021-05-15 LAB — TROPONIN I (HIGH SENSITIVITY)
Troponin I (High Sensitivity): <2 ng/L (ref <18)
Troponin I (High Sensitivity): 2 ng/L (ref <18)

## 2021-05-15 LAB — D-DIMER, QUANTITATIVE: D-Dimer, Quant: 0.38 ug/mL-FEU (ref 0.00–0.50)

## 2021-05-15 MED ORDER — SODIUM CHLORIDE 0.9 % IV SOLN
1.0000 g | Freq: Once | INTRAVENOUS | Status: AC
  Administered 2021-05-15: 1 g via INTRAVENOUS
  Filled 2021-05-15: qty 10

## 2021-05-15 MED ORDER — CEPHALEXIN 500 MG PO CAPS: ORAL_CAPSULE | ORAL | 0 refills | Status: DC

## 2021-05-15 MED ORDER — POTASSIUM CHLORIDE CRYS ER 20 MEQ PO TBCR
40.0000 meq | EXTENDED_RELEASE_TABLET | Freq: Once | ORAL | Status: AC
  Administered 2021-05-15: 40 meq via ORAL
  Filled 2021-05-15: qty 2

## 2021-05-15 MED ORDER — SODIUM CHLORIDE 0.9 % IV BOLUS
1000.0000 mL | Freq: Once | INTRAVENOUS | Status: AC
  Administered 2021-05-15: 1000 mL via INTRAVENOUS

## 2021-05-15 NOTE — ED Provider Notes (Signed)
[facility]-EMERGENCY DEPT Provider Note   CSN: [id number] Arrival date & time: 05/15/21  1139     History No chief complaint on file.   Lauren [name] is a 40 y.o. female.  The history is provided by the patient and medical records. No language interpreter was used.   40 year old female significant history of fibromyalgia, hypertension, anemia, anxiety, obesity brought here via EMS for complaints of shortness of breath.  Patient report a week ago she noticed discomfort in her tongue.  States of tongue felt dry and burning.  Several days later she noticing change in her taste, having some body aches, and increased fatigue.  She also endorsed throbbing headache, pain in her chest and having shortness of breath with exertion.  She went back to work today but report feeling very tired even with minimal exertion.  She did report recent sick contact with someone with COVID-19 infection at work but no prolonged exposure.  She has been fully vaccinated for COVID-19.  She denies pleuritic chest pain or dysuria.  She for today because she was very fatigued.  Chest pain described as an achy sensation.  She felt hot.  Past Medical History:  Diagnosis Date   Anemia    Anxiety    Constipation    Depression    Fibromyalgia    GERD (gastroesophageal reflux disease)    Hemorrhoids    HSV-1 infection    Hypertension    Hypothyroidism    OSA on CPAP    Other abnormal glucose    Preeclampsia     Patient Active Problem List   Diagnosis Date Noted   Treatment-emergent central sleep apnea 04/20/2021   Mild obstructive sleep apnea 05/21/2020   Hypersomnia, persistent 05/21/2020   Menorrhagia with irregular cycle 01/22/2020   Urine pregnancy test negative 01/22/2020   Uterine enlargement 01/22/2020   Major depressive disorder, recurrent episode, moderate (HCC) 12/03/2019   Carpal tunnel syndrome, bilateral 06/21/2019   Narcolepsy 04/10/2019   Liver lesion, left lobe  10/04/2018   Fibromyalgia 06/04/2018   Obstructive sleep apnea treated with continuous positive airway pressure (CPAP) 05/23/2018   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   Hypersomnia with sleep apnea 01/16/2018   Hepatomegaly 12/21/2017   Hepatic steatosis 12/21/2017   Headache associated with sexual activity 03/08/2017   Dizziness 03/08/2017   Medication management 09/09/2015   Abnormal glucose 06/02/2015   Hyperlipidemia LDL goal <100 06/02/2015   Morbid obesity (HCC) 06/02/2015   Depression    Hypertension    Hypothyroidism 04/30/2012   Paralysis, 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    Past Surgical History:  Procedure Laterality Date   CARPAL TUNNEL RELEASE Right 02/2020   CHOLECYSTECTOMY  2006   TONSILLECTOMY     WISDOM TOOTH EXTRACTION       OB History     Gravida  2   Para  2   Term      Preterm      AB      Living  2      SAB      IAB      Ectopic      Multiple      Live Births              Family History  Problem Relation Age of Onset   Hypothyroidism Mother    Cervical cancer Mother    Bipolar disorder Mother    Diabetes Mother    Colon polyps Mother    Bipolar disorder Brother  Kidney disease Brother    Alcohol abuse Brother    Hypothyroidism Daughter    Heart disease Maternal Grandmother    Diabetes Maternal Grandmother    Cancer Maternal Grandmother        SKIN   Hyperlipidemia Maternal Grandmother    Hypertension Maternal Grandmother    Hypothyroidism Maternal Grandmother    Lung cancer Maternal Grandfather    Heart disease Maternal Aunt    Diabetes Maternal Aunt    Diabetes Cousin    Colon cancer Neg Hx     Social History   Tobacco Use   Smoking status: Never   Smokeless tobacco: Never  Vaping Use   Vaping Use: Never used  Substance Use Topics   Alcohol use: Not Currently    Comment: occ   Drug use: No    Home Medications Prior to Admission medications   Medication Sig Start Date End Date Taking?  Authorizing Provider  amphetamine-dextroamphetamine ([name]) 20 MG 24 hr capsule Take 20 mg by mouth daily before lunch. 11:40 am 10/14/20   [provider]  amphetamine-dextroamphetamine ([name]) 30 MG 24 hr capsule Take 30 mg by mouth every morning. 12/18/20   [provider]  ARIPiprazole ([name]) 10 MG tablet TAKE 1/2 TABLET (5MG ) BY MOUTH DAILY 10/14/20   Dohmeier, 10/16/20, MD  Cholecalciferol (VITAMIN D3) 125 MCG (5000 UT) CAPS Take 1 capsule by mouth daily.    [provider]  Iron-Vitamin C 65-125 MG TABS Take by mouth daily.    [provider]  L-Methylfolate-Algae (DEPLIN 15 PO) Take by mouth.    [provider]  lithium carbonate (ESKALITH) 450 MG CR tablet Take  1 tablet  2 x /day  to Stabilize Mood 04/09/21   04/11/21, MD  meloxicam (MOBIC) 15 MG tablet Take 1 tablet (15 mg total) by mouth daily. 01/27/21 01/27/22  03/29/22, NP  pantoprazole (PROTONIX) 40 MG tablet Take 1 tablet (40 mg total) by mouth daily. 02/08/21   02/10/21, NP  rosuvastatin (CRESTOR) 5 MG tablet Take  1 tablet  Daily  for Cholesterol 03/27/21   05/27/21, MD  SYNTHROID 200 MCG tablet Take 1/2 tab MWF and 1 tablet all other days on an empty stomach with only water for 30 minutes & no Antacid meds, Calcium or Magnesium for 4 hours & avoid Biotin 04/13/21   04/15/21, NP  vitamin E 100 UNIT capsule Take by mouth daily. Takes 180mg     [provider]  vortioxetine HBr ([name]) 20 MG TABS tablet Take 20 mg by mouth daily.    [provider]    Allergies    Lamictal [lamotrigine], Doxycycline, Effexor [venlafaxine], Savella [milnacipran hcl], and Prilosec [omeprazole]  Review of Systems   Review of Systems  All other systems reviewed and are negative.  Physical Exam Updated Vital Signs BP (!) 141/91 (BP Location: Right Arm)   Pulse 94   Temp 99 F (37.2 C) (Oral)   Resp 16   Ht 5\' 1"  (1.549 m)   Wt 76.2  kg   LMP 05/14/2021 (Exact Date)   SpO2 100%   BMI 31.74 kg/m   Physical Exam Vitals and nursing note reviewed.  Constitutional:      General: She is not in acute distress.    Appearance: She is well-developed.  HENT:     Head: Atraumatic.     Mouth/Throat:     Mouth: Mucous membranes are moist.     Comments: Tongue with normal  appearance, throat exam unremarkable. Eyes:     Conjunctiva/sclera: Conjunctivae normal.  Cardiovascular:     Rate and Rhythm: Normal rate and regular rhythm.     Pulses: Normal pulses.     Heart sounds: Normal heart sounds.  Pulmonary:     Effort: Pulmonary effort is normal.  Abdominal:     Palpations: Abdomen is soft.     Tenderness: There is no abdominal tenderness.  Musculoskeletal:     Cervical back: Neck supple.     Comments: Able to move all 4 extremities.  Skin:    General: Skin is warm.     Findings: No rash.  Neurological:     Mental Status: She is alert and oriented to person, place, and time.  Psychiatric:        Mood and Affect: Mood normal.    ED Results / Procedures / Treatments   Labs (all labs ordered are listed, but only abnormal results are displayed) Labs Reviewed  BASIC METABOLIC PANEL - Abnormal; Notable for the following components:      Result Value   Potassium 3.1 (*)    All other components within normal limits  URINALYSIS, ROUTINE W REFLEX MICROSCOPIC - Abnormal; Notable for the following components:   Color, Urine RED (*)    APPearance CLOUDY (*)    pH 8.5 (*)    Hgb urine dipstick LARGE (*)    Bilirubin Urine SMALL (*)    Protein, ur 30 (*)    Leukocytes,Ua MODERATE (*)    RBC / HPF >50 (*)    All other components within normal limits  RESP PANEL BY RT-PCR (FLU A&B, COVID) ARPGX2  D-DIMER, QUANTITATIVE  CBC  POC URINE PREG, ED  TROPONIN I (HIGH SENSITIVITY)  TROPONIN I (HIGH SENSITIVITY)    EKG EKG Interpretation  Date/Time:  Saturday May 15 2021 12:10:40 EDT Ventricular Rate:  88 PR  Interval:  125 QRS Duration: 89 QT Interval:  364 QTC Calculation: 441 R Axis:   49 Text Interpretation: Sinus rhythm Confirmed by Lorre Nick ([id number]) on 05/15/2021 3:03:39 PM  Date: 05/15/2021  Rate: 88  Rhythm: normal sinus rhythm  QRS Axis: normal  Intervals: normal  ST/T Wave abnormalities: normal  Conduction Disutrbances: none  Narrative Interpretation:   Old EKG Reviewed: No significant changes noted    Radiology DG Chest 2 View  Result Date: 05/15/2021 CLINICAL DATA:  Shortness of breath, chest pressure EXAM: CHEST - 2 VIEW COMPARISON:  04/15/2020 FINDINGS: The heart size and mediastinal contours are within normal limits. Both lungs are clear. The visualized skeletal structures are unremarkable. IMPRESSION: No acute abnormality of the lungs. Electronically Signed   By: Lauralyn Primes M.D.   On: 05/15/2021 14:27    Procedures Procedures   Medications Ordered in ED Medications - No data to display  ED Course  I have reviewed the triage vital signs and the nursing notes.  Pertinent labs & imaging results that were available during my care of the patient were reviewed by me and considered in my medical decision making (see chart for details).    MDM Rules/Calculators/A&P                          BP 124/84   Pulse 86   Temp 99 F (37.2 C) (Rectal)   Resp (!) 31   Ht 5\' 1"  (1.549 m)   Wt 76.2 kg   LMP 05/14/2021 (Exact Date)   SpO2 99%  BMI 31.74 kg/m   Final Clinical Impression(s) / ED Diagnoses Final diagnoses:  Shortness of breath  Acute lower UTI (urinary tract infection)    Rx / DC Orders ED Discharge Orders          Ordered    cephALEXin (KEFLEX) 500 MG capsule        05/15/21 1620           12:59 PM Patient complaining of fatigue, tongue irritation, shortness of breath and chest pain.  Patient was found to be tachycardic in the room therefore I cannot PERC to rule out PE.  D-dimer ordered.  Tongue examination unremarkable.  Due to recent  COVID exposure, will check COVID.  IV fluid given, work-up initiated.  3:01 PM Chest x-ray unremarkable, COVID test is negative, UA concerning for potential urinary tract infection as patient has cloudy appearance with moderate leukocyte esterase but large hemoglobin and urine dipsticks.  Patient is currently on her menstruation but she denies any significant urinary discomfort.  Pregnancy test is negative.  Fortunately D-dimer is negative.  Potassium is depleted at 3.1, supplementation given.  Patient does endorse urinary frequency therefore I will treat for suspected UTI with Rocephin and will discharge home with oral antibiotic.  My suspicion for PE is low as D-dimer is negative.  Patient does not have any findings to suggest CHF exacerbation.  Her EKG is unremarkable doubt ACS.  Normal troponin  4:16 PM Will discharge home with Keflex, made aware to return if symptoms worsen.   Fayrene Helperran, Elyanah Farino, PA-C 05/15/21 1621    Lorre NickAllen, Anthony, MD 05/18/21 1047

## 2021-05-15 NOTE — Discharge Instructions (Addendum)
You have been evaluated for your symptoms.  No significant findings to suggest blood clot in your lungs or heart condition.  Your chest x-ray is normal.  Your urine shows potential urinary tract infection therefore take antibiotic as prescribed.  Your potassium level is low, please eat foods high in potassium such as a banana daily for the next week.  Follow-up with your doctor for further care, return exam to concern

## 2021-05-15 NOTE — ED Triage Notes (Signed)
Pt presents to ED via EMS with heavy pressure on the chest with SOB. The patient reported to EMS that the SOB is worse with exertion while the chest pressure is always present but improves with rest and laying flat.  EMS placed pt at 2L [place] for comfort but did not note any hypoxia.  Pt denies any nausea today but did have some yesterday. Pt works at [employer] with some reports of COVID. EMS placed a 20g IV in the LAC.  EMS VS: 142/72, HR: 74, 98%RA 98.7 temp

## 2021-05-27 ENCOUNTER — Ambulatory Visit: Payer: BC Managed Care – PPO | Admitting: Cardiology

## 2021-05-27 ENCOUNTER — Other Ambulatory Visit
Admission: RE | Admit: 2021-05-27 | Discharge: 2021-05-27 | Disposition: A | Discharge status: Home / Self Care | Payer: BC Managed Care – PPO | Source: Ambulatory Visit | Attending: Cardiology | Admitting: Cardiology

## 2021-05-27 ENCOUNTER — Encounter: Payer: Self-pay | Admitting: Cardiology

## 2021-05-27 ENCOUNTER — Other Ambulatory Visit: Payer: Self-pay

## 2021-05-27 VITALS — BP 132/86 | HR 91 | Ht 61.0 in | Wt 165.0 lb

## 2021-05-27 DIAGNOSIS — R0600 Dyspnea, unspecified: Secondary | ICD-10-CM

## 2021-05-27 DIAGNOSIS — R072 Precordial pain: Secondary | ICD-10-CM

## 2021-05-27 DIAGNOSIS — E7800 Pure hypercholesterolemia, unspecified: Secondary | ICD-10-CM | POA: Diagnosis not present

## 2021-05-27 DIAGNOSIS — R0609 Other forms of dyspnea: Secondary | ICD-10-CM

## 2021-05-27 LAB — BASIC METABOLIC PANEL
Anion gap: 9 (ref 5–15)
BUN: 13 mg/dL (ref 6–20)
CO2: 26 mmol/L (ref 22–32)
Calcium: 9.4 mg/dL (ref 8.9–10.3)
Chloride: 103 mmol/L (ref 98–111)
Creatinine, Ser: 0.71 mg/dL (ref 0.44–1.00)
GFR, Estimated: >60 mL/min (ref >60)
Glucose, Bld: 95 mg/dL (ref 70–99)
Potassium: 4.2 mmol/L (ref 3.5–5.1)
Sodium: 138 mmol/L (ref 135–145)

## 2021-05-27 MED ORDER — METOPROLOL TARTRATE 100 MG PO TABS: 100.0000 mg | ORAL_TABLET | Freq: Once | ORAL | 0 refills | Status: DC

## 2021-05-27 MED ORDER — IVABRADINE HCL 5 MG PO TABS: 15.0000 mg | ORAL_TABLET | Freq: Once | ORAL | Status: DC

## 2021-05-27 NOTE — Patient Instructions (Signed)
Medication Instructions:  Your physician recommends that you continue on your current medications as directed. Please refer to the Current Medication list given to you today.  *If you need a refill on your cardiac medications before your next appointment, please call your pharmacy*   Lab Work:  Your physician recommends that you return for lab work in: after your appointment today at [facility]   If you have labs (blood work) drawn today and your tests are completely normal, you will receive your results only by: MyChart Message (if you have MyChart) OR A paper copy in the mail If you have any lab test that is abnormal or we need to change your treatment, we will call you to review the results.   Testing/Procedures:   Your physician has requested that you have an echocardiogram. Echocardiography is a painless test that uses sound waves to create images of your heart. It provides your doctor with information about the size and shape of your heart and how well your heart's chambers and valves are working. This procedure takes approximately one hour. There are no restrictions for this procedure.  2.    Your physician has requested that you have cardiac CT. Cardiac computed tomography (CT) is a painless test that uses an x-ray machine   to take clear, detailed pictures of your heart.     Your cardiac CT will be scheduled at :   [facility] [address] [place], [place] [address] [phone]  Please arrive 15 mins early for check-in and test prep.   Please follow these instructions carefully (unless otherwise directed):    On the Night Before the Test: Be sure to Drink plenty of water. Do not consume any caffeinated/decaffeinated beverages or chocolate 12 hours prior to your test. Do not take any antihistamines 12 hours prior to your test.    On the Day of the Test: Drink plenty of water until 1 hour prior to the  test. Do not eat any food 4 hours prior to the test. You may take your regular medications prior to the test.  Take metoprolol (Lopressor) two hours prior to test. (This was sent to your pharmacy)  Take Corlanor 15 MG (3 tabs) two hours prior to test (This was given to you at your visit) FEMALES- please wear underwire-free bra if available        After the Test: Drink plenty of water. After receiving IV contrast, you may experience a mild flushed feeling. This is normal. On occasion, you may experience a mild rash up to 24 hours after the test. This is not dangerous. If this occurs, you can take Benadryl 25 mg and increase your fluid intake. If you experience trouble breathing, this can be serious. If it is severe call 911 IMMEDIATELY. If it is mild, please call our office. If you take any of these medications: Glipizide/Metformin, Avandament, Glucavance, please do not take 48 hours after completing test unless otherwise instructed.   Once we have confirmed authorization from your insurance company, we will call you to set up a date and time for your test. Based on how quickly your insurance processes prior authorizations requests, please allow up to 4 weeks to be contacted for scheduling your Cardiac CT appointment. Be advised that routine Cardiac CT appointments could be scheduled as many as 8 weeks after your provider has ordered it.  For non-scheduling related questions, please contact the cardiac imaging nurse navigator should you have any questions/concerns:  Rockwell Alexandria, Cardiac Imaging Nurse Navigator Larey Brick, Cardiac Imaging Nurse Navigator [facility] Direct Office Dial: [phone]   For scheduling needs, including cancellations and rescheduling, please call [place], [phone].  Follow-Up: At [facility], you and your health needs are our priority.  As part of our continuing mission to provide you with exceptional heart care, we have  created designated Provider Care Teams.  These Care Teams include your primary Cardiologist (physician) and Advanced Practice Providers (APPs -  Physician Assistants and Nurse Practitioners) who all work together to provide you with the care you need, when you need it.  We recommend signing up for the patient portal called "MyChart".  Sign up information is provided on this After Visit Summary.  MyChart is used to connect with patients for Virtual Visits (Telemedicine).  Patients are able to view lab/test results, encounter notes, upcoming appointments, etc.  Non-urgent messages can be sent to your provider as well.   To learn more about what you can do with MyChart, go to [email or web address].    Your next appointment:   6 week(s)  The format for your next appointment:   In Person  Provider:   Debbe Odea, MD   Other Instructions

## 2021-05-27 NOTE — Progress Notes (Signed)
Cardiology Office Note:    Date:  05/27/2021   ID:  Lauren [name], DOB [birth year], MRN [id number]  PCP:  Delma Officer, PA   [facility] Providers Cardiologist:  None     Referring MD: Delma Officer, PA   Chief Complaint  Patient presents with   New Patient (Initial Visit)    Referred by PCP for  exertional chest tightness and dyspnea. Patient states PCP would like her to have a stress test. Meds reviewed verbally with patient.    Lauren [name] is a 40 y.o. female who is being seen today for the evaluation of chest pain at the request of Delma Officer, [place].   History of Present Illness:    Lauren [name] is a 40 y.o. female with a hx of GERD, hyperlipidemia, anxiety presenting with chest pain.   Patient had episodes of chest pain and shortness of breath 2 weeks ago while walking at [employer].  She sat down for a little bit with improvement in symptoms, went home and visited water a lot.  Try to go upstairs on the same day and noticed shortness of breath and also chest tightness which felt like pressure.  Also noticed neck tightness.  She was taken to the emergency room by family, work-up was unrevealing.  Since then, she has not walked as much as before.  Has occasional nonspecific chest tightness not related with exertion.  She denies smoking,.  Was previously on statin, lost about 37 pounds with improvement in cholesterol levels, managing cholesterol with diet currently.  History of heart disease in grandmother.  Attributes some of her symptoms to stress.  Past Medical History:  Diagnosis Date   Anemia    Anxiety    Constipation    Depression    Fibromyalgia    GERD (gastroesophageal reflux disease)    Hemorrhoids    HSV-1 infection    Hypertension    Hypothyroidism    OSA on CPAP    Other abnormal glucose    Preeclampsia     Past Surgical History:  Procedure Laterality Date   CARPAL TUNNEL RELEASE Right 02/2020   CHOLECYSTECTOMY   2006   TONSILLECTOMY     WISDOM TOOTH EXTRACTION      Current Medications: Current Meds  Medication Sig   amphetamine-dextroamphetamine ([name]) 30 MG 24 hr capsule Take 30 mg by mouth every morning.   ARIPiprazole ([name]) 10 MG tablet TAKE 1/2 TABLET (5MG ) BY MOUTH DAILY   Ca, Mg, K, and Na Oxybates (XYWAV PO) Take 4.5 mg by mouth 2 (two) times daily.   Cholecalciferol (VITAMIN D3) 125 MCG (5000 UT) CAPS Take 1 capsule by mouth daily.   Iron-Vitamin C 65-125 MG TABS Take by mouth daily.   L-Methylfolate-Algae (DEPLIN 15 PO) Take by mouth.   lithium carbonate (ESKALITH) 450 MG CR tablet Take  1 tablet  2 x /day  to Stabilize Mood   metoprolol tartrate (LOPRESSOR) 100 MG tablet Take 1 tablet (100 mg total) by mouth once for 1 dose. Take 2 hours prior to your CT scan.   pantoprazole (PROTONIX) 40 MG tablet Take 1 tablet (40 mg total) by mouth daily.   SYNTHROID 200 MCG tablet Take 1/2 tab MWF and 1 tablet all other days on an empty stomach with only water for 30 minutes & no Antacid meds, Calcium or Magnesium for 4 hours & avoid Biotin   vitamin E 100 UNIT capsule Take by mouth daily. Takes 180mg   vortioxetine HBr ([name]) 20 MG TABS tablet Take 20 mg by mouth daily.   [DISCONTINUED] ivabradine (CORLANOR) 5 MG TABS tablet Take 3 tablets (15 mg total) by mouth once for 1 dose. Take 2 hours prior to your CT scan.     Allergies:   Lamictal [lamotrigine], Doxycycline, Effexor [venlafaxine], Savella [milnacipran hcl], and Prilosec [omeprazole]   Social History   Socioeconomic History   Marital status: Married    Spouse name: Not on file   Number of children: 2   Years of education: Not on file   Highest education level: Not on file  Occupational History   Occupation: TEACHER    Employer: [employer]  Tobacco Use   Smoking status: Never   Smokeless tobacco: Never  Vaping Use   Vaping Use: Never used  Substance and Sexual Activity   Alcohol use: Not Currently     Comment: occ   Drug use: No   Sexual activity: Yes    Partners: Male    Birth control/protection: None, Surgical    Comment: husband has had a vasectomy  Other Topics Concern   Not on file  Social History Narrative   4 caffeine drinks daily    Social Determinants of Health   Financial Resource Strain: Not on file  Food Insecurity: Not on file  Transportation Needs: Not on file  Physical Activity: Not on file  Stress: Not on file  Social Connections: Not on file     Family History: The patient's family history includes Alcohol abuse in her brother; Bipolar disorder in her brother and mother; Cancer in her maternal grandmother; Cervical cancer in her mother; Colon polyps in her mother; Diabetes in her cousin, maternal aunt, maternal grandmother, and mother; Heart disease in her maternal aunt and maternal grandmother; Hyperlipidemia in her maternal grandmother; Hypertension in her maternal grandmother; Hypothyroidism in her daughter, maternal grandmother, and mother; Kidney disease in her brother; Lung cancer in her maternal grandfather. There is no history of Colon cancer.  ROS:   Please see the history of present illness.     All other systems reviewed and are negative.  EKGs/Labs/Other Studies Reviewed:    The following studies were reviewed today:   EKG:  EKG is  ordered today.  The ekg ordered today demonstrates normal sinus rhythm, normal ECG.  Recent Labs: 10/29/2020: Magnesium 2.1 01/27/2021: ALT 27 04/12/2021: TSH 0.01 05/15/2021: BUN 14; Creatinine, Ser 0.74; Hemoglobin 14.6; Platelets 186; Potassium 3.1; Sodium 139  Recent Lipid Panel    Component Value Date/Time   CHOL 170 10/29/2020 1602   TRIG 139 10/29/2020 1602   HDL 58 10/29/2020 1602   CHOLHDL 2.9 10/29/2020 1602   VLDL 34 (H) 03/08/2017 1621   LDLCALC 88 10/29/2020 1602   LDLDIRECT 114.9 04/30/2012 1555     Risk Assessment/Calculations:          Physical Exam:    VS:  BP 132/86 (BP Location:  Left Arm, Patient Position: Sitting, Cuff Size: Normal)   Pulse 91   Ht 5\' 1"  (1.549 m)   Wt 165 lb (74.8 kg)   LMP 05/14/2021 (Exact Date)   SpO2 98%   BMI 31.18 kg/m     Wt Readings from Last 3 Encounters:  05/27/21 165 lb (74.8 kg)  05/15/21 168 lb (76.2 kg)  01/27/21 198 lb (89.8 kg)     GEN:  Well nourished, well developed in no acute distress HEENT: Normal NECK: No JVD; No carotid bruits LYMPHATICS: No lymphadenopathy CARDIAC: RRR,  no murmurs, rubs, gallops RESPIRATORY:  Clear to auscultation without rales, wheezing or rhonchi  ABDOMEN: Soft, non-tender, non-distended MUSCULOSKELETAL:  No edema; No deformity  SKIN: Warm and dry NEUROLOGIC:  Alert and oriented x 3 PSYCHIATRIC:  Normal affect   ASSESSMENT:    1. Precordial pain   2. Dyspnea on exertion   3. Pure hypercholesterolemia    PLAN:    In order of problems listed above:  Chest pain, associated with exertion.  Previous hyperlipidemia, currently diet managed.  Get echo, get coronary CTA to evaluate presence of CAD. Shortness of breath, echo and CTA as above.  Consider other etiologies based on results.  Anxiety could be contributing. Hyperlipidemia, last cholesterol levels controlled.  Low-cholesterol diet advised.  Follow-up after echo and coronary CTA.    Medication Adjustments/Labs and Tests Ordered: Current medicines are reviewed at length with the patient today.  Concerns regarding medicines are outlined above.  Orders Placed This Encounter  Procedures   CT CORONARY MORPH W/CTA COR W/SCORE W/CA W/CM &/OR WO/CM   Basic metabolic panel   EKG 12-Lead   ECHOCARDIOGRAM COMPLETE    Meds ordered this encounter  Medications   metoprolol tartrate (LOPRESSOR) 100 MG tablet    Sig: Take 1 tablet (100 mg total) by mouth once for 1 dose. Take 2 hours prior to your CT scan.    Dispense:  1 tablet    Refill:  0   DISCONTD: ivabradine (CORLANOR) 5 MG TABS tablet    Sig: Take 3 tablets (15 mg total) by  mouth once for 1 dose. Take 2 hours prior to your CT scan.    Dispense:  60 tablet     Patient Instructions  Medication Instructions:  Your physician recommends that you continue on your current medications as directed. Please refer to the Current Medication list given to you today.  *If you need a refill on your cardiac medications before your next appointment, please call your pharmacy*   Lab Work:  Your physician recommends that you return for lab work in: after your appointment today at [facility]   If you have labs (blood work) drawn today and your tests are completely normal, you will receive your results only by: MyChart Message (if you have MyChart) OR A paper copy in the mail If you have any lab test that is abnormal or we need to change your treatment, we will call you to review the results.   Testing/Procedures:   Your physician has requested that you have an echocardiogram. Echocardiography is a painless test that uses sound waves to create images of your heart. It provides your doctor with information about the size and shape of your heart and how well your heart's chambers and valves are working. This procedure takes approximately one hour. There are no restrictions for this procedure.  2.    Your physician has requested that you have cardiac CT. Cardiac computed tomography (CT) is a painless test that uses an x-ray machine   to take clear, detailed pictures of your heart.     Your cardiac CT will be scheduled at :   [facility] [address] [place], [place] [address] [phone]  Please arrive 15 mins early for check-in and test prep.   Please follow these instructions carefully (unless otherwise directed):    On the Night Before the Test: Be sure to Drink plenty of water. Do not consume any caffeinated/decaffeinated beverages or chocolate 12 hours prior to  your test. Do not take any antihistamines  12 hours prior to your test.    On the Day of the Test: Drink plenty of water until 1 hour prior to the test. Do not eat any food 4 hours prior to the test. You may take your regular medications prior to the test.  Take metoprolol (Lopressor) two hours prior to test. (This was sent to your pharmacy)  Take Corlanor 15 MG (3 tabs) two hours prior to test (This was given to you at your visit) FEMALES- please wear underwire-free bra if available        After the Test: Drink plenty of water. After receiving IV contrast, you may experience a mild flushed feeling. This is normal. On occasion, you may experience a mild rash up to 24 hours after the test. This is not dangerous. If this occurs, you can take Benadryl 25 mg and increase your fluid intake. If you experience trouble breathing, this can be serious. If it is severe call 911 IMMEDIATELY. If it is mild, please call our office. If you take any of these medications: Glipizide/Metformin, Avandament, Glucavance, please do not take 48 hours after completing test unless otherwise instructed.   Once we have confirmed authorization from your insurance company, we will call you to set up a date and time for your test. Based on how quickly your insurance processes prior authorizations requests, please allow up to 4 weeks to be contacted for scheduling your Cardiac CT appointment. Be advised that routine Cardiac CT appointments could be scheduled as many as 8 weeks after your provider has ordered it.  For non-scheduling related questions, please contact the cardiac imaging nurse navigator should you have any questions/concerns: Rockwell Alexandria, Cardiac Imaging Nurse Navigator Larey Brick, Cardiac Imaging Nurse Navigator  Heart and Vascular Services Direct Office Dial: [phone]   For scheduling needs, including cancellations and rescheduling, please call [place], [phone].  Follow-Up: At [facility], you and your health needs  are our priority.  As part of our continuing mission to provide you with exceptional heart care, we have created designated Provider Care Teams.  These Care Teams include your primary Cardiologist (physician) and Advanced Practice Providers (APPs -  Physician Assistants and Nurse Practitioners) who all work together to provide you with the care you need, when you need it.  We recommend signing up for the patient portal called "MyChart".  Sign up information is provided on this After Visit Summary.  MyChart is used to connect with patients for Virtual Visits (Telemedicine).  Patients are able to view lab/test results, encounter notes, upcoming appointments, etc.  Non-urgent messages can be sent to your provider as well.   To learn more about what you can do with MyChart, go to [email or web address].    Your next appointment:   6 week(s)  The format for your next appointment:   In Person  Provider:   Debbe Odea, MD   Other Instructions    Signed, Debbe Odea, MD  05/27/2021 12:26 PM    [facility]

## 2021-06-08 ENCOUNTER — Encounter: Payer: Self-pay | Admitting: Neurology

## 2021-06-08 ENCOUNTER — Other Ambulatory Visit: Payer: Self-pay | Admitting: Neurology

## 2021-06-08 MED ORDER — XYWAV 500 MG/ML PO SOLN: ORAL | 5 refills | Status: DC

## 2021-06-09 ENCOUNTER — Telehealth (HOSPITAL_COMMUNITY): Payer: Self-pay | Admitting: Emergency Medicine

## 2021-06-09 NOTE — Telephone Encounter (Signed)
Reaching out to patient to offer assistance regarding upcoming cardiac imaging study; pt verbalizes understanding of appt date/time, parking situation and where to check in, pre-test NPO status and medications ordered, and verified current allergies; name and call back number provided for further questions should they arise Lauren Alexandria RN Navigator Cardiac Imaging [facility] [phone] office [phone] cell  Denies iv issues  Denies claustro 100mg  metoprolol + 15mg  ivabradine (Requested patient have driver)

## 2021-06-10 ENCOUNTER — Other Ambulatory Visit: Payer: Self-pay

## 2021-06-10 ENCOUNTER — Ambulatory Visit
Admission: RE | Admit: 2021-06-10 | Discharge: 2021-06-10 | Disposition: A | Discharge status: Home / Self Care | Payer: BC Managed Care – PPO | Source: Ambulatory Visit | Attending: Cardiology | Admitting: Cardiology

## 2021-06-10 DIAGNOSIS — R072 Precordial pain: Secondary | ICD-10-CM

## 2021-06-10 MED ORDER — IOHEXOL 350 MG/ML SOLN
75.0000 mL | Freq: Once | INTRAVENOUS | Status: AC | PRN
  Administered 2021-06-10: 75 mL via INTRAVENOUS

## 2021-06-10 MED ORDER — NITROGLYCERIN 0.4 MG SL SUBL
0.8000 mg | SUBLINGUAL_TABLET | Freq: Once | SUBLINGUAL | Status: AC
  Administered 2021-06-10: 0.8 mg via SUBLINGUAL

## 2021-06-10 NOTE — Progress Notes (Signed)
Patient tolerated CT well. Drank water after. Vital signs stable encourage to drink water throughout day.Reasons explained and verbalized understanding. Ambulated steady gait.  

## 2021-06-11 ENCOUNTER — Ambulatory Visit: Payer: BC Managed Care – PPO | Admitting: Cardiovascular Disease

## 2021-06-21 ENCOUNTER — Encounter (HOSPITAL_COMMUNITY)
Admission: RE | Disposition: A | Discharge status: Home / Self Care | Payer: Self-pay | Source: Home / Self Care | Attending: Surgery

## 2021-06-21 ENCOUNTER — Encounter (HOSPITAL_COMMUNITY): Payer: Self-pay | Admitting: Surgery

## 2021-06-21 ENCOUNTER — Ambulatory Visit (HOSPITAL_COMMUNITY)
Admission: RE | Admit: 2021-06-21 | Discharge: 2021-06-21 | Disposition: A | Discharge status: Home / Self Care | Payer: BC Managed Care – PPO | Attending: Surgery | Admitting: Surgery

## 2021-06-21 ENCOUNTER — Telehealth: Payer: Self-pay | Admitting: Neurology

## 2021-06-21 DIAGNOSIS — R3911 Hesitancy of micturition: Secondary | ICD-10-CM | POA: Diagnosis not present

## 2021-06-21 HISTORY — PX: ANAL RECTAL MANOMETRY: SHX6358

## 2021-06-21 SURGERY — MANOMETRY, ANORECTAL

## 2021-06-21 NOTE — Telephone Encounter (Signed)
Called the pharmacy to clarify the script. The date was not on the script advised date should read 7/19./22

## 2021-06-21 NOTE — Progress Notes (Signed)
Anorectal manometry performed per protocol.  Patient tolerated well.  50 cc expulsion balloon was placed.  Patient was unable to expel balloon at 3 minutes.  Water was removed and balloon taken out.  Dr Marin Olp notified of AR to read.

## 2021-06-21 NOTE — Telephone Encounter (Signed)
[facility] Colin Mulders) called, need clarification on issue date for Ca, Mg, K, and Na Oxybates (XYWAV) 500 MG/ML SOLN. Would like a call from the nurse

## 2021-06-22 ENCOUNTER — Encounter (HOSPITAL_COMMUNITY): Payer: Self-pay | Admitting: Surgery

## 2021-06-28 ENCOUNTER — Other Ambulatory Visit: Payer: BC Managed Care – PPO

## 2021-07-09 ENCOUNTER — Ambulatory Visit: Payer: BC Managed Care – PPO | Admitting: Cardiology

## 2021-07-15 ENCOUNTER — Encounter: Payer: Self-pay | Admitting: Neurology

## 2021-08-08 ENCOUNTER — Encounter: Payer: Self-pay | Admitting: Neurology

## 2021-08-08 IMAGING — CR DG CHEST 2V
2 series · 2 of 2 positions shown · non-contrast
Comparison: 04/15/2020

CLINICAL DATA: Shortness of breath, chest pressure

EXAM:
CHEST - 2 VIEW

[w chest lat]
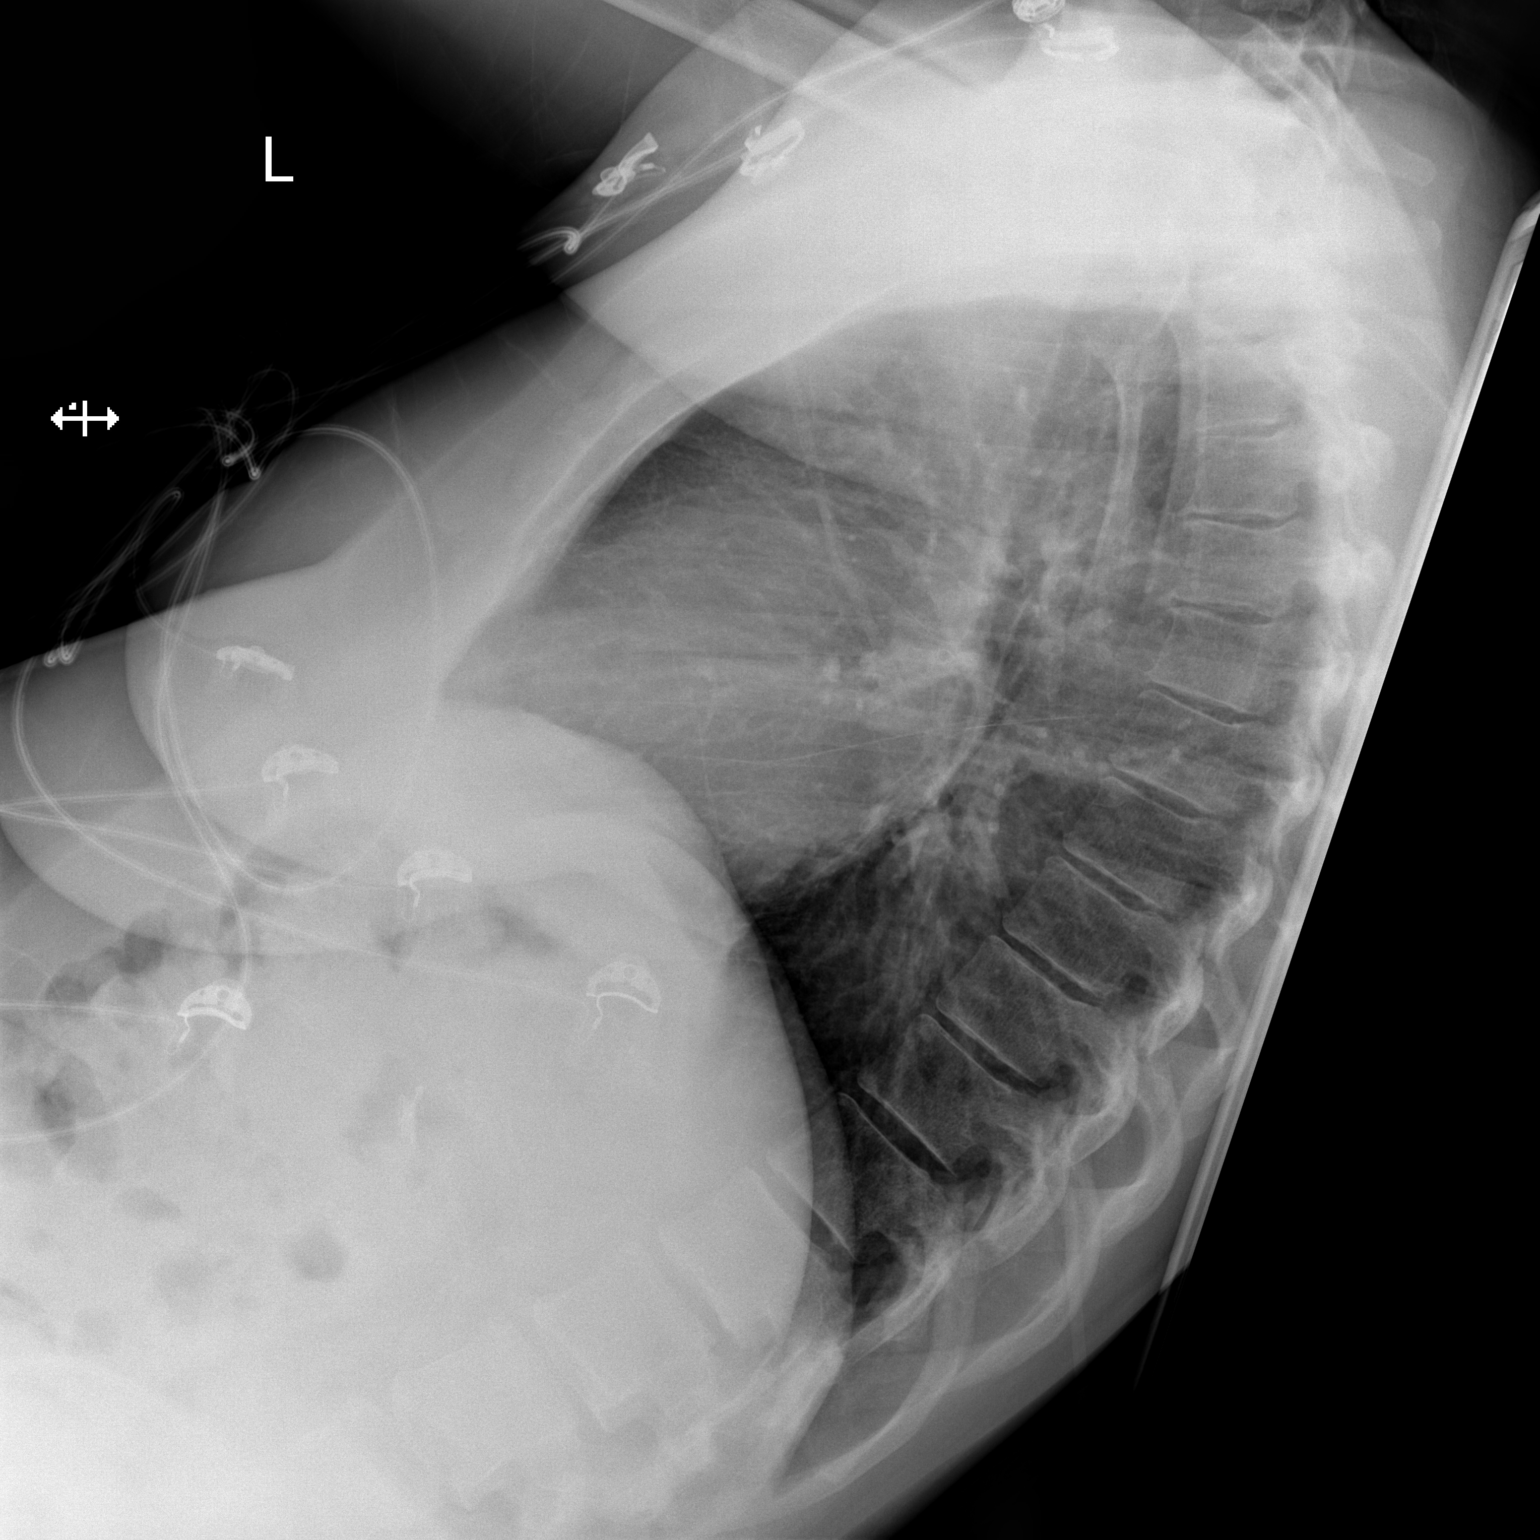

[x chest ap]
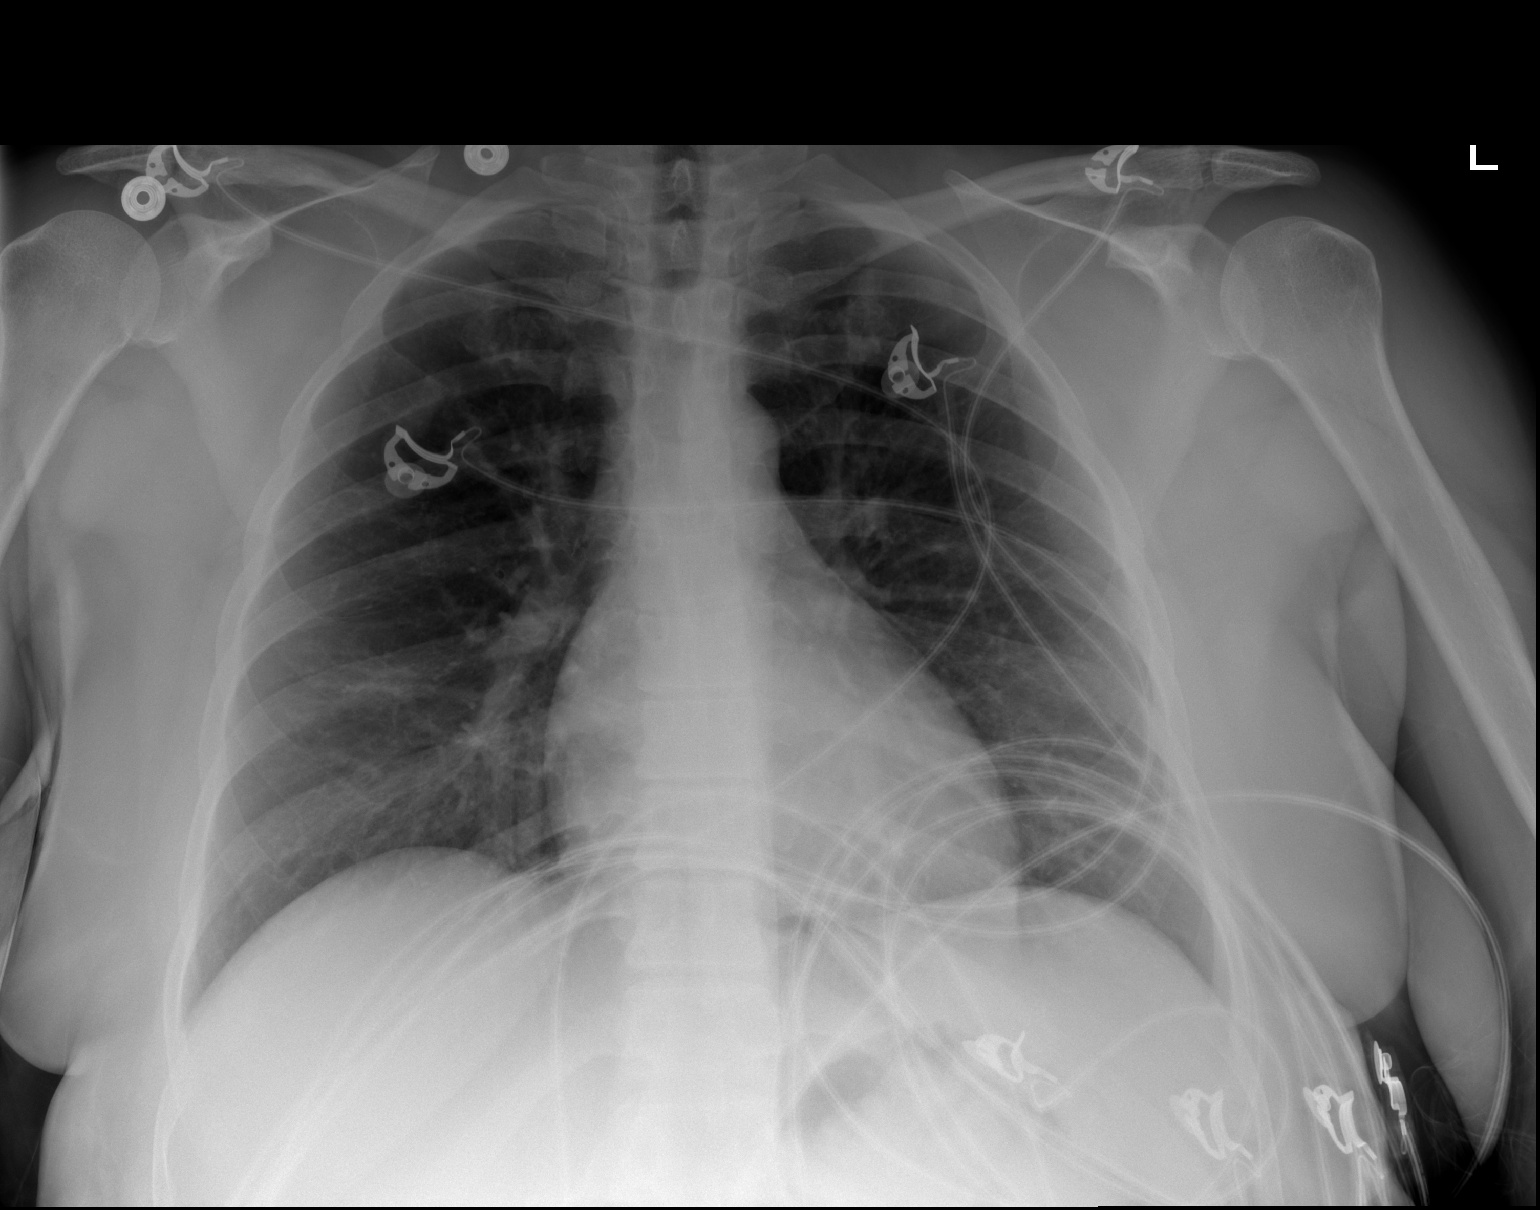

[2 of 2 positions shown; findings below may reference images not displayed]

FINDINGS: The heart size and mediastinal contours are within normal limits.
Both lungs are clear. The visualized skeletal structures are
unremarkable.
IMPRESSION: No acute abnormality of the lungs.

## 2021-08-10 ENCOUNTER — Other Ambulatory Visit: Payer: Self-pay | Admitting: Neurology

## 2021-08-10 DIAGNOSIS — F331 Major depressive disorder, recurrent, moderate: Secondary | ICD-10-CM

## 2021-08-25 ENCOUNTER — Ambulatory Visit (INDEPENDENT_AMBULATORY_CARE_PROVIDER_SITE_OTHER): Payer: BC Managed Care – PPO | Admitting: Adult Health

## 2021-08-25 ENCOUNTER — Other Ambulatory Visit: Payer: Self-pay

## 2021-08-25 ENCOUNTER — Other Ambulatory Visit (HOSPITAL_COMMUNITY)
Admission: RE | Admit: 2021-08-25 | Discharge: 2021-08-25 | Disposition: A | Discharge status: Home / Self Care | Payer: BC Managed Care – PPO | Source: Ambulatory Visit | Attending: Adult Health | Admitting: Adult Health

## 2021-08-25 ENCOUNTER — Encounter: Payer: Self-pay | Admitting: Adult Health

## 2021-08-25 VITALS — BP 121/79 | HR 89 | Ht 60.75 in | Wt 158.5 lb

## 2021-08-25 DIAGNOSIS — Z01419 Encounter for gynecological examination (general) (routine) without abnormal findings: Secondary | ICD-10-CM | POA: Diagnosis present

## 2021-08-25 DIAGNOSIS — Z1231 Encounter for screening mammogram for malignant neoplasm of breast: Secondary | ICD-10-CM

## 2021-08-25 DIAGNOSIS — N951 Menopausal and female climacteric states: Secondary | ICD-10-CM | POA: Diagnosis not present

## 2021-08-25 DIAGNOSIS — N816 Rectocele: Secondary | ICD-10-CM | POA: Diagnosis not present

## 2021-08-25 DIAGNOSIS — Z1211 Encounter for screening for malignant neoplasm of colon: Secondary | ICD-10-CM

## 2021-08-25 DIAGNOSIS — R232 Flushing: Secondary | ICD-10-CM | POA: Diagnosis not present

## 2021-08-25 LAB — HEMOCCULT GUIAC POC 1CARD (OFFICE): Fecal Occult Blood, POC: NEGATIVE

## 2021-08-25 MED ORDER — VALACYCLOVIR HCL 1 G PO TABS: 1000.0000 mg | ORAL_TABLET | Freq: Two times a day (BID) | ORAL | 3 refills | Status: DC

## 2021-08-25 NOTE — Progress Notes (Signed)
Patient ID: Lauren [name], female   DOB: June 13, 1981, 40 y.o.   MRN: [id number] History of Present Illness:  Lauren [name] is a 40 year old white female,married, G2P2, in for a well woman gyn exam and pap. She is a Runner, broadcasting/film/video. Periods are lighter and having some hot flashes and night sweats and moody at times. Has trouble with BMs at times., ?rectocele. She had physical and labs with PCP.  PCP is [employer] PA.  Current Medications, Allergies, Past Medical History, Past Surgical History, Family History and Social History were reviewed in [name] record.     Review of Systems: Patient denies any headaches, hearing loss, fatigue, blurred vision, shortness of breath, chest pain, abdominal pain, problems with bowel movements, urination, or intercourse. No joint pain. She HPI for positives.    Physical Exam: BP 121/79 (BP Location: Left Arm, Patient Position: Sitting, Cuff Size: Normal)   Pulse 89   Ht 5' 0.75" (1.543 m)   Wt 158 lb 8 oz (71.9 kg)   LMP 08/10/2021   BMI 30.20 kg/m   General:  Well developed, well nourished, no acute distress Skin:  Warm and dry Neck:  Midline trachea, normal thyroid, good ROM, no lymphadenopathy Lungs; Clear to auscultation bilaterally Breast:  No dominant palpable mass, retraction, or nipple discharge Cardiovascular: Regular rate and rhythm Abdomen:  Soft, non tender, no hepatosplenomegaly Pelvic:  External genitalia is normal in appearance, no lesions.  The vagina is normal in appearance. Urethra has no lesions or masses. The cervix is smooth, pap with HR HPV genotyping performed.  Uterus is felt to be normal size, shape, and contour.  No adnexal masses or tenderness noted.Bladder is non tender, no masses felt. Rectal: Good sphincter tone, no polyps, or hemorrhoids felt.  Hemoccult negative.+rectocele Extremities/musculoskeletal:  No swelling or varicosities noted, no clubbing or cyanosis Psych:  No mood changes, alert and  cooperative,seems happy AA is 0  Fall risk is moderate Depression screen [facility] 2/9 08/25/2021 03/05/2019 12/31/2018  Decreased Interest 0 1 0  Down, Depressed, Hopeless 0 1 0  PHQ - 2 Score 0 2 0  Altered sleeping 0 1 -  Tired, decreased energy 1 1 -  Change in appetite 1 1 -  Feeling bad or failure about yourself  0 1 -  Trouble concentrating 0 0 -  Moving slowly or fidgety/restless 0 0 -  Suicidal thoughts 0 0 -  PHQ-9 Score 2 6 -  Difficult doing work/chores - Somewhat difficult -  Some recent data might be hidden   She is on meds GAD 7 : Generalized Anxiety Score 08/25/2021  Nervous, Anxious, on Edge 1  Control/stop worrying 1  Worry too much - different things 1  Trouble relaxing 2  Restless 2  Easily annoyed or irritable 1  Afraid - awful might happen 1  Total GAD 7 Score 9      Upstream - 08/25/21 0843       Pregnancy Intention Screening   Does the patient want to become pregnant in the next year? No    Does the patient's partner want to become pregnant in the next year? No    Would the patient like to discuss contraceptive options today? No      Contraception Wrap Up   Current Method Vasectomy    End Method Vasectomy    Contraception Counseling Provided No               Examination chaperoned by Malachy Mood LPN  Impression and Plan: 1. Encounter for gynecological examination with Papanicolaou smear of cervix Pap sent  Physical with PCP Pap in 3 years if normal  2. Encounter for screening fecal occult blood testing   3. Peri-menopause Discussed symptoms with her Let me know if desires HRT  4. Hot flashes   5. Rectocele Try stool softener like senokot or miralax Place finger in vagina and push back  6. Screening mammogram for breast cancer Mammogram scheduled for her 10/15/21 at 7 am at [facility]

## 2021-08-26 LAB — CYTOLOGY - PAP
Comment: NEGATIVE
Diagnosis: NEGATIVE
High risk HPV: NEGATIVE

## 2021-10-15 ENCOUNTER — Other Ambulatory Visit: Payer: Self-pay

## 2021-10-15 ENCOUNTER — Ambulatory Visit (HOSPITAL_COMMUNITY)
Admission: RE | Admit: 2021-10-15 | Discharge: 2021-10-15 | Disposition: A | Discharge status: Home / Self Care | Payer: BC Managed Care – PPO | Source: Ambulatory Visit | Attending: Adult Health | Admitting: Adult Health

## 2021-10-15 DIAGNOSIS — Z1231 Encounter for screening mammogram for malignant neoplasm of breast: Secondary | ICD-10-CM | POA: Insufficient documentation

## 2021-11-09 ENCOUNTER — Encounter: Payer: Self-pay | Admitting: *Deleted

## 2021-11-10 NOTE — Telephone Encounter (Signed)
Sent email to [employer] to get update on where things are at in getting pt set up for bipap, waiting on response.

## 2021-11-10 NOTE — Telephone Encounter (Signed)
Received the following update from Christina S./[employer]:   "This order should be assigned to the ADV, RT group and not the local branch.  Jari Favre- please assign this to A1.1 Intake Processing, PAP, PFR/Scheduling for todays date. The Central Scheduling team will reach out to go over patient cost and then they will assign it to the Respiratory Therapist to setup. This CAN NOT be setup by a PAP Tech - it has to be an RT.  Hampton Abbot- an RT will make contact to setup patient in the home. But these devices have been hard to get. We are starting to get them more regular now so getting patient scheduled should not be an issue at this point"

## 2021-11-11 ENCOUNTER — Other Ambulatory Visit: Payer: Self-pay

## 2021-11-11 ENCOUNTER — Ambulatory Visit (INDEPENDENT_AMBULATORY_CARE_PROVIDER_SITE_OTHER): Payer: BC Managed Care – PPO | Admitting: Family Medicine

## 2021-11-11 ENCOUNTER — Telehealth: Payer: Self-pay | Admitting: Neurology

## 2021-11-11 ENCOUNTER — Encounter: Payer: Self-pay | Admitting: Family Medicine

## 2021-11-11 VITALS — BP 132/81 | HR 91 | Ht 60.75 in | Wt 151.5 lb

## 2021-11-11 DIAGNOSIS — G4710 Hypersomnia, unspecified: Secondary | ICD-10-CM | POA: Diagnosis not present

## 2021-11-11 DIAGNOSIS — G4733 Obstructive sleep apnea (adult) (pediatric): Secondary | ICD-10-CM

## 2021-11-11 DIAGNOSIS — Z9989 Dependence on other enabling machines and devices: Secondary | ICD-10-CM | POA: Diagnosis not present

## 2021-11-11 NOTE — Patient Instructions (Addendum)
Please continue using your CPAP regularly. While your insurance requires that you use CPAP at least 4 hours each night on 70% of the nights, I recommend, that you not skip any nights and use it throughout the night if you can. Getting used to CPAP and staying with the treatment long term does take time and patience and discipline. Untreated obstructive sleep apnea when it is moderate to severe can have an adverse impact on cardiovascular health and raise her risk for heart disease, arrhythmias, hypertension, congestive heart failure, stroke and diabetes. Untreated obstructive sleep apnea causes sleep disruption, nonrestorative sleep, and sleep deprivation. This can have an impact on your day to day functioning and cause daytime sleepiness and impairment of cognitive function, memory loss, mood disturbance, and problems focussing. Using CPAP regularly can improve these symptoms.   Continue using CPAP until you receive your new BiPAP machine. Call me to schedule follow up 31-90 days following BiPAP set up. Continue Xywav 4.5g at 8pm and 12am. We will update labs, today. Continue close follow up with PCP and psychiatry.

## 2021-11-11 NOTE — Progress Notes (Signed)
Chief Complaint  Patient presents with   Obstructive Sleep Apnea    Rm 1, alone. Here for CPAP f/u. Pt reports still feeling tired by the afternoon. Currently her thyroid is off. Pt reports loosing around 50lbs and is wondering if it changes anything.      HISTORY OF PRESENT ILLNESS:  11/11/21 ALL:  Lauren [name] is a 40 y.o. female here today for follow up for idiopathic hypersomnia. She has been compliant on CPAP therapy but AHI not well managed. BiPAP has been ordered but she is waiting on DME to call with machine (currently backordered). She still has more fatigue in the evenings.   She continues Xywav 4.5g twice nightly. First dose when in bed at 8pm. Second dose usually around 12am. She usually wakes around 5am. She does not wake feeling refreshed. She is following closely with PCP. TSH was high and recent adjustments to synthroid were made. She has not received new dose from pharmacy.   Dr Toy Care continues to follow closely. She continues lithium. She feels mood is stable although she has felt a little more irritable that she normally does.   HISTORY (copied from Dr Dohmeier's previous note)  Rv 01-12-2021; with husband, here with persistent hypersomnia and extreme fatigue, no cardiac disease, no diabetes, but elevated BP.    Has dizzyspells.lightheaded but positive romberg, staring spells, weakness spells. No loss of awareness.    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  She is not functioning at work and feels overwhelmed, and she is afraid she can't find another job. Thi sounds more like depression.        REVIEW OF SYSTEMS: Out of a complete 14 system review of symptoms, the patient complains only of the following symptoms, fatigue, depression irritability and all other reviewed systems are negative.  ESS: 5   ALLERGIES: Allergies  Allergen Reactions   Lamictal [Lamotrigine] Rash    Doxycycline Other (See Comments)    GI UPSET   Effexor [Venlafaxine] Other (See Comments)    DYSPHORIA   Savella [Milnacipran Hcl] Other (See Comments)    NO RELIEF   Prilosec [Omeprazole] Itching     HOME MEDICATIONS: Outpatient Medications Prior to Visit  Medication Sig Dispense Refill   amphetamine-dextroamphetamine ([name]) 30 MG 24 hr capsule Take 30 mg by mouth every morning.     ARIPiprazole ([name]) 10 MG tablet TAKE 1/2 TABLET (5MG) BY MOUTH DAILY 45 tablet 2   Ca, Mg, K, and Na Oxybates (XYWAV) 500 MG/ML SOLN Take 4.5 G twice nightly 540 mL 5   Cholecalciferol (VITAMIN D3) 125 MCG (5000 UT) CAPS Take 1 capsule by mouth daily.     Iron-Vitamin C 65-125 MG TABS Take by mouth daily.     L-Methylfolate-Algae (DEPLIN 15 PO) Take by mouth.     lithium carbonate (ESKALITH) 450 MG CR tablet Take 1 tablet by mouth at bedtime.     rosuvastatin (CRESTOR) 5 MG tablet Take  1 tablet  Daily  for Cholesterol (Patient taking differently: Takes 1 tab on Monday, Wednesday and Friday) 90 tablet 3   SYNTHROID 112 MCG tablet Take 112 mcg by mouth every morning.     valACYclovir (VALTREX) 1000 MG tablet Take 1 tablet (1,000 mg total) by mouth 2 (two) times daily. 20 tablet 3   vitamin E 100 UNIT capsule Take by mouth daily. Takes 180m     vortioxetine HBr ([name]) 20  MG TABS tablet Take 20 mg by mouth daily.     levothyroxine (SYNTHROID) 100 MCG tablet Synthroid 100 mcg tablet  1 TABLET EVERY MORNING ON AN EMPTY STOMACH - BRAND NAME ONLY     lithium carbonate (ESKALITH) 450 MG CR tablet Take  1 tablet  2 x /day  to Stabilize Mood 180 tablet 3   metoprolol tartrate (LOPRESSOR) 100 MG tablet Take 1 tablet (100 mg total) by mouth once for 1 dose. Take 2 hours prior to your CT scan. 1 tablet 0   No facility-administered medications prior to visit.     PAST MEDICAL HISTORY: Past Medical History:  Diagnosis Date   Anemia    Anxiety    Constipation    Depression    Fibromyalgia     GERD (gastroesophageal reflux disease)    Hemorrhoids    HSV-1 infection    Hypertension    Hypothyroidism    Idiopathic hypersomnia    OSA on CPAP    Other abnormal glucose    Preeclampsia      PAST SURGICAL HISTORY: Past Surgical History:  Procedure Laterality Date   ANAL RECTAL MANOMETRY N/A 06/21/2021   Procedure: ANO RECTAL MANOMETRY;  Surgeon: Ileana Roup, MD;  Location: [facility];  Service: General;  Laterality: N/A;   CARPAL TUNNEL RELEASE Right 02/2020   CHOLECYSTECTOMY  2006   thumb surgery Bilateral    trigger thumb   TONSILLECTOMY     WISDOM TOOTH EXTRACTION       FAMILY HISTORY: Family History  Problem Relation Age of Onset   Hypothyroidism Mother    Cervical cancer Mother    Bipolar disorder Mother    Diabetes Mother    Colon polyps Mother    Bipolar disorder Brother    Kidney disease Brother    Alcohol abuse Brother    Hypothyroidism Daughter    Heart disease Maternal Grandmother    Diabetes Maternal Grandmother    Cancer Maternal Grandmother        SKIN   Hyperlipidemia Maternal Grandmother    Hypertension Maternal Grandmother    Hypothyroidism Maternal Grandmother    Lung cancer Maternal Grandfather    Heart disease Maternal Aunt    Diabetes Maternal Aunt    Diabetes Cousin    Colon cancer Neg Hx      SOCIAL HISTORY: Social History   Socioeconomic History   Marital status: Married    Spouse name: Not on file   Number of children: 2   Years of education: Not on file   Highest education level: Not on file  Occupational History   Occupation: TEACHER    Employer: [employer]  Tobacco Use   Smoking status: Never   Smokeless tobacco: Never  Vaping Use   Vaping Use: Never used  Substance and Sexual Activity   Alcohol use: Not Currently    Comment: occ   Drug use: No   Sexual activity: Yes    Partners: Male    Birth control/protection: Surgical    Comment: husband has had a vasectomy  Other Topics Concern    Not on file  Social History Narrative   4 caffeine drinks daily    Social Determinants of Health   Financial Resource Strain: Medium Risk   Difficulty of Paying Living Expenses: Somewhat hard  Food Insecurity: No Food Insecurity   Worried About Estate manager/land agent of Food in the Last Year: Never true   Ran Out of Food in the Last Year: Never  true  Transportation Needs: No Transportation Needs   Lack of Transportation (Medical): No   Lack of Transportation (Non-Medical): No  Physical Activity: Insufficiently Active   Days of Exercise per Week: 3 days   Minutes of Exercise per Session: 30 min  Stress: Stress Concern Present   Feeling of Stress : To some extent  Social Connections: Moderately Integrated   Frequency of Communication with Friends and Family: More than three times a week   Frequency of Social Gatherings with Friends and Family: Once a week   Attends Religious Services: More than 4 times per year   Active Member of [employer] or Organizations: No   Attends Archivist Meetings: Never   Marital Status: Married  Human resources officer Violence: Not At Risk   Fear of Current or Ex-Partner: No   Emotionally Abused: No   Physically Abused: No   Sexually Abused: No     PHYSICAL EXAM  Vitals:   11/11/21 1443  BP: 132/81  Pulse: 91  Weight: 151 lb 8 oz (68.7 kg)  Height: 5' 0.75" (1.543 m)   Body mass index is 28.86 kg/m.  Generalized: Well developed, in no acute distress  Cardiology: normal rate and rhythm, no murmur auscultated  Respiratory: clear to auscultation bilaterally    Neurological examination  Mentation: Alert oriented to time, place, history taking. Follows all commands speech and language fluent Cranial nerve II-XII: Pupils were equal round reactive to light. Extraocular movements were full, visual field were full on confrontational test. Facial sensation and strength were normal. Head turning and shoulder shrug  were normal and symmetric. Motor: The  motor testing reveals 5 over 5 strength of all 4 extremities. Good symmetric motor tone is noted throughout.  Gait and station: Gait is normal.   DIAGNOSTIC DATA (LABS, IMAGING, TESTING) - I reviewed patient records, labs, notes, testing and imaging myself where available.  Lab Results  Component Value Date   WBC 9.9 05/15/2021   HGB 14.6 05/15/2021   HCT 43.8 05/15/2021   MCV 86.4 05/15/2021   PLT 186 05/15/2021      Component Value Date/Time   NA 138 05/27/2021 1240   K 4.2 05/27/2021 1240   CL 103 05/27/2021 1240   CO2 26 05/27/2021 1240   GLUCOSE 95 05/27/2021 1240   BUN 13 05/27/2021 1240   CREATININE 0.71 05/27/2021 1240   CREATININE 0.69 01/27/2021 1700   CALCIUM 9.4 05/27/2021 1240   PROT 7.1 01/27/2021 1700   ALBUMIN 4.3 06/19/2018 1520   AST 21 01/27/2021 1700   ALT 27 01/27/2021 1700   ALKPHOS 56 06/19/2018 1520   BILITOT 0.5 01/27/2021 1700   GFRNONAA >60 05/27/2021 1240   GFRNONAA 107 01/27/2021 1700   GFRAA 124 01/27/2021 1700   Lab Results  Component Value Date   CHOL 170 10/29/2020   HDL 58 10/29/2020   LDLCALC 88 10/29/2020   LDLDIRECT 114.9 04/30/2012   TRIG 139 10/29/2020   CHOLHDL 2.9 10/29/2020   Lab Results  Component Value Date   HGBA1C 5.9 (H) 01/27/2021   Lab Results  Component Value Date   GHWEXHBZ16 967 01/27/2021   Lab Results  Component Value Date   TSH 0.01 (L) 04/12/2021    No flowsheet data found.   No flowsheet data found.   ASSESSMENT AND PLAN  40 y.o. year old female  has a past medical history of Anemia, Anxiety, Constipation, Depression, Fibromyalgia, GERD (gastroesophageal reflux disease), Hemorrhoids, HSV-1 infection, Hypertension, Hypothyroidism, Idiopathic hypersomnia, OSA on CPAP,  Other abnormal glucose, and Preeclampsia. here with    Hypersomnia, persistent  Obstructive sleep apnea treated with continuous positive airway pressure (CPAP)  Demeisha does feel that Donney Rankins has helped significantly with  hypersomnia. We will continue 4.5g at 8pm and 12am. She is aware of appropriate administration and to keep this medication secured at all times. I will update labs, today. She will continue CPAP until she receives new BiPAP. AHI 12.9/hr and events are central apneas. She was encouraged to use BiPAP nightly for at least 4 hours. She will continue close follow up with PCP and psychiatry. She will follow up with me in 31-90 days following BiPAP set up.    No orders of the defined types were placed in this encounter.    No orders of the defined types were placed in this encounter.     Debbora Presto, MSN, FNP-C 11/11/2021, 2:51 PM  [facility] [address], [place], [place] [address] [phone]

## 2021-11-11 NOTE — Telephone Encounter (Signed)
Pulled rx sent. Confirmed no date. I resent with date. Received fax confirmation.

## 2021-11-11 NOTE — Telephone Encounter (Signed)
[facility] Janice Coffin) calling to verify date on prescription for Ca, Mg, K, and Na Oxybates (XYWAV) 500 MG/ML SOLN

## 2021-11-12 LAB — COMPREHENSIVE METABOLIC PANEL
ALT: 11 IU/L (ref 0–32)
AST: 13 IU/L (ref 0–40)
Albumin/Globulin Ratio: 1.6 (ref 1.2–2.2)
Albumin: 4.4 g/dL (ref 3.8–4.8)
Alkaline Phosphatase: 55 IU/L (ref 44–121)
BUN/Creatinine Ratio: 17 (ref 9–23)
BUN: 12 mg/dL (ref 6–24)
Bilirubin Total: 0.4 mg/dL (ref 0.0–1.2)
CO2: 23 mmol/L (ref 20–29)
Calcium: 9.1 mg/dL (ref 8.7–10.2)
Chloride: 104 mmol/L (ref 96–106)
Creatinine, Ser: 0.69 mg/dL (ref 0.57–1.00)
Globulin, Total: 2.8 g/dL (ref 1.5–4.5)
Glucose: 110 mg/dL — ABNORMAL HIGH (ref 70–99)
Potassium: 3.9 mmol/L (ref 3.5–5.2)
Sodium: 140 mmol/L (ref 134–144)
Total Protein: 7.2 g/dL (ref 6.0–8.5)
eGFR: 111 mL/min/{1.73_m2} (ref >59)

## 2021-11-12 LAB — CBC WITH DIFFERENTIAL/PLATELET
Basophils Absolute: 0 10*3/uL (ref 0.0–0.2)
Basos: 0 %
EOS (ABSOLUTE): 0 10*3/uL (ref 0.0–0.4)
Eos: 1 %
Hematocrit: 43.7 % (ref 34.0–46.6)
Hemoglobin: 14.6 g/dL (ref 11.1–15.9)
Immature Grans (Abs): 0 10*3/uL (ref 0.0–0.1)
Immature Granulocytes: 0 %
Lymphocytes Absolute: 2 10*3/uL (ref 0.7–3.1)
Lymphs: 22 %
MCH: 29.7 pg (ref 26.6–33.0)
MCHC: 33.4 g/dL (ref 31.5–35.7)
MCV: 89 fL (ref 79–97)
Monocytes Absolute: 0.6 10*3/uL (ref 0.1–0.9)
Monocytes: 7 %
Neutrophils Absolute: 6.2 10*3/uL (ref 1.4–7.0)
Neutrophils: 70 %
Platelets: 171 10*3/uL (ref 150–450)
RBC: 4.91 x10E6/uL (ref 3.77–5.28)
RDW: 12.1 % (ref 11.7–15.4)
WBC: 8.8 10*3/uL (ref 3.4–10.8)

## 2021-11-25 ENCOUNTER — Telehealth: Payer: Self-pay | Admitting: Neurology

## 2021-11-25 ENCOUNTER — Encounter: Payer: Self-pay | Admitting: Family Medicine

## 2021-11-25 NOTE — Telephone Encounter (Signed)
Pt was schedule for her Initial CPAP visit on 01/31/2022, pt was informed to bring machine and power cord to the appt.  DME: [facility] P: [phone] Option 1 F: [phone] Equipment Issued: Emilee Hero  Schedule between 12/25/2021-02/22/2022

## 2022-01-04 ENCOUNTER — Other Ambulatory Visit: Payer: Self-pay

## 2022-01-04 ENCOUNTER — Ambulatory Visit: Payer: BC Managed Care – PPO | Attending: Surgery

## 2022-01-04 DIAGNOSIS — M62838 Other muscle spasm: Secondary | ICD-10-CM | POA: Diagnosis not present

## 2022-01-04 DIAGNOSIS — R279 Unspecified lack of coordination: Secondary | ICD-10-CM | POA: Diagnosis not present

## 2022-01-04 DIAGNOSIS — M6281 Muscle weakness (generalized): Secondary | ICD-10-CM | POA: Diagnosis present

## 2022-01-04 NOTE — Therapy (Signed)
[facility] @ [facility] [address] [place], [place], [address] Phone: [phone]   Fax:  [phone]  Physical Therapy Evaluation  Patient Details  Name: Lauren [name] MRN: [id number] Date of Birth: [birth year] Referring Provider (PT): Andria Meuse, MD   Encounter Date: 01/04/2022   PT End of Session - 01/04/22 0829     Visit Number 1    Date for PT Re-Evaluation 03/29/22    Authorization Type BCBS/UHC Medicaid    PT Start Time 0806    PT Stop Time 0846    PT Time Calculation (min) 40 min    Activity Tolerance Patient tolerated treatment well    Behavior During Therapy [facility] for tasks assessed/performed             Past Medical History:  Diagnosis Date   Anemia    Anxiety    Constipation    Depression    Fibromyalgia    GERD (gastroesophageal reflux disease)    Hemorrhoids    HSV-1 infection    Hypertension    Hypothyroidism    Idiopathic hypersomnia    OSA on CPAP    Other abnormal glucose    Preeclampsia     Past Surgical History:  Procedure Laterality Date   ANAL RECTAL MANOMETRY N/A 06/21/2021   Procedure: ANO RECTAL MANOMETRY;  Surgeon: Andria Meuse, MD;  Location: [facility];  Service: General;  Laterality: N/A;   CARPAL TUNNEL RELEASE Right 02/2020   CHOLECYSTECTOMY  2006   thumb surgery Bilateral    trigger thumb   TONSILLECTOMY     WISDOM TOOTH EXTRACTION      There were no vitals filed for this visit.    Subjective Assessment - 01/04/22 0807     Subjective Pt states that she has long history of constipation, even back to being a little girl. For the last year, she has had increaseddiffiuclty with bowel movements and feels like she just can't strain hard enough to get it out.    Pertinent History cholecystectomy; 2 vaginal deliveries; surgery on anus (pt unsure of details - botox and skin tag removal)    Patient Stated Goals To increase ease of bowel movement    Currently  in Pain? No/denies                [facility] PT Assessment - 01/04/22 0001       Assessment   Medical Diagnosis M62.89 (ICD-10-CM) - Other specified disorders of muscle; N81.6 (ICD-10-CM) - Rectocele    Referring Provider (PT) Andria Meuse, MD      Precautions   Precautions None      Restrictions   Weight Bearing Restrictions No      Balance Screen   Has the patient fallen in the past 6 months No    Has the patient had a decrease in activity level because of a fear of falling?  No    Is the patient reluctant to leave their home because of a fear of falling?  No      Home Tourist information centre manager residence      Prior Function   Level of Independence Independent      Cognition   Overall Cognitive Status Within Functional Limits for tasks assessed               No emotional/communication barriers or cognitive limitation. Patient is motivated to learn. Patient understands and agrees with treatment goals and plan. PT  explains patient will be examined in standing, sitting, and lying down to see how their muscles and joints work. When they are ready, they will be asked to remove their underwear so PT can examine their perineum. The patient is also given the option of providing their own chaperone as one is not provided in our facility. The patient also has the right and is explained the right to defer or refuse any part of the evaluation or treatment including the internal exam. With the patient's consent, PT will use one gloved finger to gently assess the muscles of the pelvic floor, seeing how well it contracts and relaxes and if there is muscle symmetry. After, the patient will get dressed and PT and patient will discuss exam findings and plan of care. PT and patient discuss plan of care, schedule, attendance policy and HEP activities.           Objective measurements completed on examination: See above findings.     Pelvic Floor Special  Questions - 01/04/22 0001     Prior Pelvic/Prostate Exam Yes    Are you Pregnant or attempting pregnancy? No    Prior Pregnancies Yes    Number of Pregnancies 2    Number of C-Sections 0    Number of Vaginal Deliveries 2    Any difficulty with labor and deliveries No    Episiotomy Performed No   tearing with both that required sutures   Diastasis Recti 2 finger widths at umbilicus    Currently Sexually Active Yes   pain with deeper thrusting thrusting - for about a year   Is this Painful Yes    History of sexually transmitted disease No    Urinary Leakage Yes    How often several times a day    Pad use no protection    Activities that cause leaking Running;Coughing;Sneezing;Laughing;Walking    Urinary urgency No    Urinary frequency every 2 hours    Fecal incontinence No   feels like she cannot get clean for several trips to the bathroom after a bowel movement; bowel movement every other day with straining   Fluid intake 4-5 glasses day    Caffeine beverages yes    Falling out feeling (prolapse) Yes    Activities that cause feeling of prolapse standing; prolonged standing    External Perineal Exam yes    Skin Integrity Intact    Scar Tear    Perineal Body/Introitus  Normal    External Palpation WNL    Prolapse Anterior Wall;Posterior Wall   Grade 1   Pelvic Floor Internal Exam Pt identity confirmed and verbal consent for internal pelvic exam provided    Exam Type Vaginal    Sensation WNL    Palpation tenderness and restriction at posterior fourchette; pulling reported throughout first layer pelvic floor, Lt>Rt; Bil deep pelvic floor tightness and tenderness, Rt>Lt    Strength weak squeeze, no lift    Strength # of reps 6    Strength # of seconds 5    Tone normal              [facility] Adult PT Treatment/Exercise - 01/04/22 0001       Self-Care   Self-Care Other Self-Care Comments   Pt education on splinting with bowel movements, squatty potty/relaxed toileting mechanics  (leaning backwards), pelvic floor anatomy/vaginal wall laxity, and pressure management.                    PT Education -  01/04/22 1031     Education Details Pt education on splinting with bowel movements, squatty potty/relaxed toileting mechanics (leaning backwards), pelvic floor anatomy/vaginal wall laxity, and pressure management.    Person(s) Educated Patient    Methods Explanation;Demonstration;Tactile cues;Verbal cues;Handout    Comprehension Verbalized understanding              PT Short Term Goals - 01/04/22 1042       PT SHORT TERM GOAL #1   Title Pt will be independent with HEP.    Time 4    Status New    Target Date 02/01/22      PT SHORT TERM GOAL #2   Title Pt will be able to correctly perform diaphragmatic breathing and appropriate pressure management in order to prevent worsening vaginal wall laxity and improve pelvic floor A/ROM.    Time 4    Period Weeks    Status New    Target Date 02/01/22      PT SHORT TERM GOAL #3   Title Pt will be independent and provide teach back of urge suppression technique and the knack in order to help decrease urinary incontinence.    Time 4    Period Weeks    Status New    Target Date 02/01/22               PT Long Term Goals - 01/04/22 1044       PT LONG TERM GOAL #1   Title Pt will be independent with advanced HEP.    Time 12    Period Weeks    Status New    Target Date 03/29/22      PT LONG TERM GOAL #2   Title Pt will demonstrate normal pelvic floor muscle tone and A/ROM, able to achieve 4/5 strength with contractions and 10 sec endurance, in order to provide appropriate lumbopelvic support in functional activities.    Time 12    Period Weeks    Status New    Target Date 03/29/22      PT LONG TERM GOAL #3   Title Pt will report 0/10 pain with vaginal penetration in order to improve intimate relationship with partner.    Time 12    Period Weeks    Status New    Target Date 03/29/22       PT LONG TERM GOAL #4   Title Pt will be able to have successful bowel movement with complete emptying, no fecal incontinence afterwards, and no straining without diffiuclty with use of squatty potty and splinting.    Time 12    Period Weeks    Status New    Target Date 03/29/22      PT LONG TERM GOAL #5   Title Pt will report no more than 1 episode of urinary incontinence a day in order to improve personal hygiene and comofrt with community activities.    Time 12    Period Weeks    Status New    Target Date 03/29/22                    Plan - 01/04/22 1032     Clinical Impression Statement Pt is a 42 year old female with chief complaint of difficulty with bowel movements and urinary incontinence and secondary complaint of dyspareunia for the last year. Exam findings notable for anterior/posterior vaginal wall laxity, poor pressure management, superficial and deep pelvic floor tension and pain, perineal scar tissue restriction, DRA  2 finger width at umbilicus with doming, pelvic floor strength 2/5, and pelvic floor endurance 5 seconds. Signs and symptoms are most consistent with poor bowel/bladder habits, anterior/posterior vaginal wall laxity, and poor coordination/decreased strength. Pt education performed on how to use splinting to incrase of of bowel movement without straining; pictures of pelvic anatomy utilized to help pt understand why and how this may be beneficial. Further education performed in importance of decreasing straining, use of squatty potty/appropriate toileting position, and exhaling with effort. She will benefit from skilled PT intervention in order to address impairments, improve ease of bowel movements, and decrease urinary incontinence.    Personal Factors and Comorbidities Comorbidity 1;Comorbidity 2;Comorbidity 3+    Comorbidities cholecystectomy; 2 vaginal deliveries; surgery on anus (pt unsure of details - botox and skin tag removal)     Examination-Activity Limitations Continence    Examination-Participation Restrictions Interpersonal Relationship;Community Activity    Stability/Clinical Decision Making Stable/Uncomplicated    Clinical Decision Making Low    Rehab Potential Good    PT Frequency 1x / week    PT Duration 12 weeks    PT Treatment/Interventions ADLs/Self Care Home Management;Biofeedback;Cryotherapy;Electrical Stimulation;Moist Heat;Therapeutic activities;Therapeutic exercise;Neuromuscular re-education;Manual techniques;Patient/family education;Scar mobilization;Passive range of motion;Dry needling;Spinal Manipulations    PT Next Visit Plan Plan to perform mobility exercises, core/hip strengthening, and down training breathing exercises; revisit toileting mechanics and see how splinting is going.    PT Home Exercise Plan NA    Consulted and Agree with Plan of Care Patient             Patient will benefit from skilled therapeutic intervention in order to improve the following deficits and impairments:  Decreased coordination, Decreased range of motion, Increased fascial restricitons, Decreased endurance, Increased muscle spasms, Pain, Decreased activity tolerance, Decreased scar mobility, Hypomobility, Decreased mobility, Decreased strength  Visit Diagnosis: Muscle weakness (generalized)  Unspecified lack of coordination  Other muscle spasm     Problem List Patient Active Problem List   Diagnosis Date Noted   Treatment-emergent central sleep apnea 04/20/2021   Mild obstructive sleep apnea 05/21/2020   Hypersomnia, persistent 05/21/2020   Menorrhagia with irregular cycle 01/22/2020   Urine pregnancy test negative 01/22/2020   Uterine enlargement 01/22/2020   Major depressive disorder, recurrent episode, moderate (HCC) 12/03/2019   Carpal tunnel syndrome, bilateral 06/21/2019   Narcolepsy 04/10/2019   Liver lesion, left lobe 10/04/2018   Fibromyalgia 06/04/2018   Obstructive sleep apnea treated  with continuous positive airway pressure (CPAP) 05/23/2018   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 [address] [place], [place], [id number] Phone: [phone]   Fax:  [phone]  Name: Lauren [name] MRN: [id number] Date of Birth: 11/30/1980

## 2022-01-04 NOTE — Patient Instructions (Addendum)
Splinting: take small amount of toilet paper and gently press up and back towards the back of the vaginal opening; you don't want to be covering up the anal opening where the bowel movement is coming out.  -Try not to strain! -Make sure you're using the stool when you do this -Lean back and try leaning to one side or the other as well  Pelvic floor anatomy: -anterior and posterior vaginal wall laxity (rectocele and cystocele or rectum prolapse and bladder prolapse)

## 2022-01-12 ENCOUNTER — Ambulatory Visit: Payer: BC Managed Care – PPO

## 2022-01-12 ENCOUNTER — Other Ambulatory Visit: Payer: Self-pay

## 2022-01-12 DIAGNOSIS — R279 Unspecified lack of coordination: Secondary | ICD-10-CM

## 2022-01-12 DIAGNOSIS — M6281 Muscle weakness (generalized): Secondary | ICD-10-CM

## 2022-01-12 DIAGNOSIS — M62838 Other muscle spasm: Secondary | ICD-10-CM

## 2022-01-12 NOTE — Therapy (Addendum)
[facility] [place], [place], [address] Phone: [phone]   Fax:  [phone]  Physical Therapy Treatment  Patient Details  Name: Lauren [name] MRN: [id number] Date of Birth: 1981-07-12 Referring Provider (PT): Ileana Roup, MD   Encounter Date: 01/12/2022   PT End of Session - 01/12/22 1620     Visit Number 2    Date for PT Re-Evaluation 03/29/22    Authorization Type BCBS/U[employer] Medicaid    PT Start Time 1619    PT Stop Time 1657    PT Time Calculation (min) 38 min    Activity Tolerance Patient tolerated treatment well    Behavior During Therapy [facility] for tasks assessed/performed             Past Medical History:  Diagnosis Date   Anemia    Anxiety    Constipation    Depression    Fibromyalgia    GERD (gastroesophageal reflux disease)    Hemorrhoids    HSV-1 infection    Hypertension    Hypothyroidism    Idiopathic hypersomnia    OSA on CPAP    Other abnormal glucose    Preeclampsia     Past Surgical History:  Procedure Laterality Date   ANAL RECTAL MANOMETRY N/A 06/21/2021   Procedure: ANO RECTAL MANOMETRY;  Surgeon: Ileana Roup, MD;  Location: [facility];  Service: General;  Laterality: N/A;   CARPAL TUNNEL RELEASE Right 02/2020   CHOLECYSTECTOMY  2006   thumb surgery Bilateral    trigger thumb   TONSILLECTOMY     WISDOM TOOTH EXTRACTION      There were no vitals filed for this visit.   Subjective Assessment - 01/12/22 1620     Subjective Pt states tha she has been using squatty potty and leaning back and had great improvement in ease of bowel movements.                               [facility] PT Treatment/Exercise - 01/12/22 0001       Neuro Re-ed    Neuro Re-ed Details  pelvic floor bulge and A/ROM training with breath coordination 10x - multimodal cues to help facilitate appropriate contraction; diaphragmatic breathing with  cues to improve pelvic floor relaxation      Exercises   Exercises Lumbar      Lumbar Exercises: Supine   Other Supine Lumbar Exercises Happy baby 10 breaths      Lumbar Exercises: Quadruped   Madcat/Old Horse 20 reps    Other Quadruped Lumbar Exercises Child's pose 10 breaths      Manual Therapy   Manual Therapy Internal Pelvic Floor;Soft tissue mobilization    Manual therapy comments bowel massage    Internal Pelvic Floor bil deep pelvic floor muscle release                     PT Education - 01/12/22 1700     Education Details Pt education performed on down training, diaphragmatic breathing, bowel massage, and pelvic floor A/ROM.    Person(s) Educated Patient    Methods Explanation;Demonstration;Tactile cues;Verbal cues;Handout    Comprehension Verbalized understanding              PT Short Term Goals - 01/04/22 1042       PT SHORT TERM GOAL #1   Title Pt will be independent with HEP.  Time 4    Status New    Target Date 02/01/22      PT SHORT TERM GOAL #2   Title Pt will be able to correctly perform diaphragmatic breathing and appropriate pressure management in order to prevent worsening vaginal wall laxity and improve pelvic floor A/ROM.    Time 4    Period Weeks    Status New    Target Date 02/01/22      PT SHORT TERM GOAL #3   Title Pt will be independent and provide teach back of urge suppression technique and the knack in order to help decrease urinary incontinence.    Time 4    Period Weeks    Status New    Target Date 02/01/22               PT Long Term Goals - 01/04/22 1044       PT LONG TERM GOAL #1   Title Pt will be independent with advanced HEP.    Time 12    Period Weeks    Status New    Target Date 03/29/22      PT LONG TERM GOAL #2   Title Pt will demonstrate normal pelvic floor muscle tone and A/ROM, able to achieve 4/5 strength with contractions and 10 sec endurance, in order to provide appropriate lumbopelvic  support in functional activities.    Time 12    Period Weeks    Status New    Target Date 03/29/22      PT LONG TERM GOAL #3   Title Pt will report 0/10 pain with vaginal penetration in order to improve intimate relationship with partner.    Time 12    Period Weeks    Status New    Target Date 03/29/22      PT LONG TERM GOAL #4   Title Pt will be able to have successful bowel movement with complete emptying, no fecal incontinence afterwards, and no straining without diffiuclty with use of squatty potty and splinting.    Time 12    Period Weeks    Status New    Target Date 03/29/22      PT LONG TERM GOAL #5   Title Pt will report no more than 1 episode of urinary incontinence a day in order to improve personal hygiene and comofrt with community activities.    Time 12    Period Weeks    Status New    Target Date 03/29/22                   Plan - 01/12/22 1646     Clinical Impression Statement Pt making great progress after first treatment session with large increase in ease of bowel movements. Pelvic floor muscle release perofrmed to Bil levator ani/puborectalis today with good reduction in restriction; she was able to perform diaphragmatic breathing to help with pelvic floor A/ROM and bulge, but rquired significant cues at first to help with appropriate diaphragmatic excursion. Good response to bowel massage and patient agreeable to performing at home on regular basis. She did well with all down training techniques. She will continue to benefit from skilled PT intervention in order to progress down training/mobility exercises and decrease pelvic floor tension.    PT Treatment/Interventions ADLs/Self Care Home Management;Biofeedback;Cryotherapy;Electrical Stimulation;Moist Heat;Therapeutic activities;Therapeutic exercise;Neuromuscular re-education;Manual techniques;Patient/family education;Scar mobilization;Passive range of motion;Dry needling;Spinal Manipulations    PT Next  Visit Plan Progress mobility exercises; consider beginning core/hip strengthening; possible return to pelvic  floor/abdominal release.    PT Home Exercise Plan [email or web address]    Consulted and Agree with Plan of Care Patient             Patient will benefit from skilled therapeutic intervention in order to improve the following deficits and impairments:  Decreased coordination, Decreased range of motion, Increased fascial restricitons, Decreased endurance, Increased muscle spasms, Pain, Decreased activity tolerance, Decreased scar mobility, Hypomobility, Decreased mobility, Decreased strength  Visit Diagnosis: Muscle weakness (generalized)  Unspecified lack of coordination  Other muscle spasm     Problem List Patient Active Problem List   Diagnosis Date Noted   Treatment-emergent central sleep apnea 04/20/2021   Mild obstructive sleep apnea 05/21/2020   Hypersomnia, persistent 05/21/2020   Menorrhagia with irregular cycle 01/22/2020   Urine pregnancy test negative 01/22/2020   Uterine enlargement 01/22/2020   Major depressive disorder, recurrent episode, moderate (HCC) 12/03/2019   Carpal tunnel syndrome, bilateral 06/21/2019   Narcolepsy 04/10/2019   Liver lesion, left lobe 10/04/2018   Fibromyalgia 06/04/2018   Obstructive sleep apnea treated with continuous positive airway pressure (CPAP) 05/23/2018   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   Hypersomnia with sleep apnea 01/16/2018   Hepatomegaly 12/21/2017   Hepatic steatosis 12/21/2017   Headache associated with sexual activity 03/08/2017   Dizziness 03/08/2017   Medication management 09/09/2015   Abnormal glucose 06/02/2015   Hyperlipidemia LDL goal <100 06/02/2015   Morbid obesity ([place]) 06/02/2015   Depression    Hypertension    Hypothyroidism 04/30/2012   Paralysis, 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    Jule Economy, PT 01/12/2022, 5:01 PM  [facility] @  [place], [place], [address] Phone: [phone]   Fax:  [phone]  Name: Lauren [name] MRN: [id number] Date of Birth: 03-27-1981  PHYSICAL THERAPY DISCHARGE SUMMARY  Visits from Start of Care: 2  Current functional level related to goals / functional outcomes: Incomplete   Remaining deficits: See above   Education / Equipment: HEP   Patient agrees to discharge. Patient goals were not met. Patient is being discharged due to not returning since the last visit.  Heather Roberts, PT, DPT06/05/239:44 PM

## 2022-01-12 NOTE — Patient Instructions (Addendum)
Bowel massage: To assist with more regular and more comfortable bowel movements, try performing bowel massage nightly for 5-10 minutes. Place hands in the lower right side of your abdomen to start; in small circles, massage up, across, and down the left side of your abdomen. Pressure does not need to be hard, but just comfortable. You can use lotion or oil to make more comfortable. Access Code: [email or web address] URL: [email or web address] Date: 01/12/2022 Prepared by: Heather Roberts  Exercises Supine Diaphragmatic Breathing - 5 x daily - 7 x weekly - 1 sets - 5 reps Supine Pelvic Floor Contraction - 2 x daily - 7 x weekly - 1 sets - 10 reps Cat Cow - 1 x daily - 7 x weekly - 2 sets - 10 reps Child's Pose Stretch - 1 x daily - 7 x weekly - 1 sets - 10 reps Happy Baby with Pelvic Floor Lengthening - 1 x daily - 7 x weekly - 1 sets - 10 reps

## 2022-01-25 ENCOUNTER — Telehealth: Payer: Self-pay | Admitting: Neurology

## 2022-01-25 NOTE — Telephone Encounter (Signed)
PA approved for the patient 01/25/2022 - 01/26/2023. ? ? ?PA# [facility] Plan [phone] [place] ?

## 2022-01-25 NOTE — Telephone Encounter (Signed)
PA completed through [employer] ?[id number] ?Will await determination ?

## 2022-01-31 ENCOUNTER — Ambulatory Visit (INDEPENDENT_AMBULATORY_CARE_PROVIDER_SITE_OTHER): Payer: BC Managed Care – PPO | Admitting: Adult Health

## 2022-01-31 ENCOUNTER — Encounter: Payer: Self-pay | Admitting: Adult Health

## 2022-01-31 ENCOUNTER — Other Ambulatory Visit: Payer: Self-pay

## 2022-01-31 VITALS — BP 144/87 | HR 86 | Ht 60.0 in | Wt 155.0 lb

## 2022-01-31 DIAGNOSIS — G4733 Obstructive sleep apnea (adult) (pediatric): Secondary | ICD-10-CM

## 2022-01-31 DIAGNOSIS — G4710 Hypersomnia, unspecified: Secondary | ICD-10-CM

## 2022-01-31 DIAGNOSIS — Z9989 Dependence on other enabling machines and devices: Secondary | ICD-10-CM | POA: Diagnosis not present

## 2022-01-31 NOTE — Progress Notes (Signed)
Primary neurologist: Dr. Brett Fairy Reason for visit: CPAP and hyperinsomnia f/u    Chief Complaint  Patient presents with   Obstructive Sleep Apnea    RM 3 alone Pt is well and stable, no concerns      HISTORY OF PRESENT ILLNESS:  Update 01/31/2022 JM: 41 year old female who returns for initial BiPAP compliance visit and hyperinsomnia.  Continues on Xywav 4.5g at 8pm and 12am and [name] 30 mg daily. Main complaint is continued daytime sleepiness, she will try not take her [name] on the weekends as she is hopeful this will help have more effect during the week days but she has great difficulty with fatigue if she doesn't take on the weekends and has not noticed improvement of afternoon fatigue. At times will wake up not feeling refreshed. She usually takes [name] between 5-6AM. 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 Fatigue severity scale 56/63 (prior 60 12/2020), Epworth Sleepiness Scale 0 (prior 5 10/2021).  Routinely followed by psychiatry who recently discontinued [name] due to tremors and tardive dyskinesias and started on Ingrezza and amantadine with symptoms gradually improving.  Continues to work with PCP correcting hypothyroidism, most recent TSH improved although still slightly elevated.    She received new BiPAP machine back in January with compliance report over the past 30 days showing excellent compliance at 100% (total and >4hrs usage) and optimal residual AHI 0.2.  Leaks in the 95th percentile 6.9.  IPAP 17, EPAP 10. Prior AHI on CPAP at 12.6! Overall tolerating BiPAP well except more recently experiencing mask leak around bridge of her nose.        History provided for reference purposes only Update 11/11/2021 ALL:  Lauren [name] is a 41 y.o. female here today for follow up for idiopathic hypersomnia. She has been compliant on CPAP therapy but AHI not well managed. BiPAP has been ordered but she is waiting on DME to call  with machine (currently backordered). She still has more fatigue in the evenings.   She continues Xywav 4.5g twice nightly. First dose when in bed at 8pm. Second dose usually around 12am. She usually wakes around 5am. She does not wake feeling refreshed. She is following closely with PCP. TSH was high and recent adjustments to synthroid were made. She has not received new dose from pharmacy.   Dr [facility] continues to follow closely. She continues lithium. She feels mood is stable although she has felt a little more irritable that she normally does.   HISTORY (copied from Dr Dohmeier's previous note)  Rv 01-12-2021; with husband, here with persistent hypersomnia and extreme fatigue, no cardiac disease, no diabetes, but elevated BP.    Has dizzyspells.lightheaded but positive romberg, staring spells, weakness spells. No loss of awareness.    HLA positive, MSLT negative- I would still consider her narcolepstic and like to change her from [place] to [name].  She is not functioning at work and feels overwhelmed, and she is afraid she can't find another job. Thi sounds more like depression.        REVIEW OF SYSTEMS: Out of a complete 14 system review of symptoms, the patient complains only of the following symptoms, fatigue, depression irritability and all other reviewed systems are negative.    ALLERGIES: Allergies  Allergen Reactions   Lamictal [Lamotrigine] Rash   Doxycycline Other (See Comments)    GI UPSET   Effexor [Venlafaxine] Other (See Comments)    DYSPHORIA   Savella [  Milnacipran Hcl] Other (See Comments)    NO RELIEF   Prilosec [Omeprazole] Itching     HOME MEDICATIONS: Outpatient Medications Prior to Visit  Medication Sig Dispense Refill   amantadine (SYMMETREL) 100 MG capsule Take 100 mg by mouth 2 (two) times daily.     amphetamine-dextroamphetamine ([name]) 30 MG 24 hr capsule Take 30 mg by mouth every morning.     Ca, Mg, K, and Na Oxybates (XYWAV) 500 MG/ML SOLN  Take 4.5 G twice nightly 540 mL 5   cariprazine (VRAYLAR) 1.5 MG capsule Take by mouth daily.     Cholecalciferol (VITAMIN D3) 125 MCG (5000 UT) CAPS Take 1 capsule by mouth daily.     Iron-Vitamin C 65-125 MG TABS Take by mouth daily.     L-Methylfolate-Algae (DEPLIN 15 PO) Take by mouth.     levothyroxine (SYNTHROID) 125 MCG tablet Take 125 mcg by mouth every morning.     lithium carbonate (ESKALITH) 450 MG CR tablet Take 1 tablet by mouth at bedtime.     rosuvastatin (CRESTOR) 5 MG tablet Take  1 tablet  Daily  for Cholesterol (Patient taking differently: Takes 1 tab on Monday, Wednesday and Friday) 90 tablet 3   valACYclovir (VALTREX) 1000 MG tablet Take 1 tablet (1,000 mg total) by mouth 2 (two) times daily. 20 tablet 3   valbenazine (INGREZZA) 80 MG capsule Take 80 mg by mouth daily.     vitamin E 100 UNIT capsule Take by mouth daily. Takes 164m     vortioxetine HBr ([name]) 20 MG TABS tablet Take 20 mg by mouth daily.     ARIPiprazole ([name]) 10 MG tablet TAKE 1/2 TABLET (5MG) BY MOUTH DAILY (Patient not taking: Reported on 01/31/2022) 45 tablet 2   No facility-administered medications prior to visit.     PAST MEDICAL HISTORY: Past Medical History:  Diagnosis Date   Anemia    Anxiety    Constipation    Depression    Fibromyalgia    GERD (gastroesophageal reflux disease)    Hemorrhoids    HSV-1 infection    Hypertension    Hypothyroidism    Idiopathic hypersomnia    OSA on CPAP    Other abnormal glucose    Preeclampsia      PAST SURGICAL HISTORY: Past Surgical History:  Procedure Laterality Date   ANAL RECTAL MANOMETRY N/A 06/21/2021   Procedure: ANO RECTAL MANOMETRY;  Surgeon: WIleana Roup MD;  [facility];  Service: General;  Laterality: N/A;   CARPAL TUNNEL RELEASE Right 02/2020   CHOLECYSTECTOMY  2006   thumb surgery Bilateral    trigger thumb   TONSILLECTOMY     WISDOM TOOTH EXTRACTION       FAMILY HISTORY: Family History   Problem Relation Age of Onset   Hypothyroidism Mother    Cervical cancer Mother    Bipolar disorder Mother    Diabetes Mother    Colon polyps Mother    Bipolar disorder Brother    Kidney disease Brother    Alcohol abuse Brother    Hypothyroidism Daughter    Heart disease Maternal Grandmother    Diabetes Maternal Grandmother    Cancer Maternal Grandmother        SKIN   Hyperlipidemia Maternal Grandmother    Hypertension Maternal Grandmother    Hypothyroidism Maternal Grandmother    Lung cancer Maternal Grandfather    Heart disease Maternal Aunt    Diabetes Maternal Aunt    Diabetes Cousin  Colon cancer Neg Hx      SOCIAL HISTORY: Social History   Socioeconomic History   Marital status: Married    Spouse name: Not on file   Number of children: 2   Years of education: Not on file   Highest education level: Not on file  Occupational History   Occupation: TEACHER    Employer: [employer]  Tobacco Use   Smoking status: Never   Smokeless tobacco: Never  Vaping Use   Vaping Use: Never used  Substance and Sexual Activity   Alcohol use: Not Currently    Comment: occ   Drug use: No   Sexual activity: Yes    Partners: Male    Birth control/protection: Surgical    Comment: husband has had a vasectomy  Other Topics Concern   Not on file  Social History Narrative   4 caffeine drinks daily    Social Determinants of Health   Financial Resource Strain: Medium Risk   Difficulty of Paying Living Expenses: Somewhat hard  Food Insecurity: No Food Insecurity   Worried About Charity fundraiser in the Last Year: Never true   Ran Out of Food in the Last Year: Never true  Transportation Needs: No Transportation Needs   Lack of Transportation (Medical): No   Lack of Transportation (Non-Medical): No  Physical Activity: Insufficiently Active   Days of Exercise per Week: 3 days   Minutes of Exercise per Session: 30 min  Stress: Stress Concern Present   Feeling  of Stress : To some extent  Social Connections: Moderately Integrated   Frequency of Communication with Friends and Family: More than three times a week   Frequency of Social Gatherings with Friends and Family: Once a week   Attends Religious Services: More than 4 times per year   Active Member of [employer] or Organizations: No   Attends Archivist Meetings: Never   Marital Status: Married  Human resources officer Violence: Not At Risk   Fear of Current or Ex-Partner: No   Emotionally Abused: No   Physically Abused: No   Sexually Abused: No     PHYSICAL EXAM  Vitals:   01/31/22 0736  BP: (!) 144/87  Pulse: 86  Weight: 155 lb (70.3 kg)  Height: 5' (1.524 m)    Body mass index is 30.27 kg/m.  Generalized: Well developed, pleasant middle-age Caucasian female, in no acute distress  Cardiology: normal rate and rhythm, no murmur auscultated  Respiratory: clear to auscultation bilaterally    Neurological examination  Mentation: Alert oriented to time, place, history taking. Follows all commands speech and language fluent, flat affect and depressed mood Cranial nerve II-XII: Pupils were equal round reactive to light. Extraocular movements were full, visual field were full on confrontational test. Facial sensation and strength were normal. Head turning and shoulder shrug  were normal and symmetric. Motor: The motor testing reveals 5 over 5 strength of all 4 extremities. Good symmetric motor tone is noted throughout.  Gait and station: Gait is normal.     DIAGNOSTIC DATA (LABS, IMAGING, TESTING) - I reviewed patient records, labs, notes, testing and imaging myself where available.  Lab Results  Component Value Date   WBC 8.8 11/11/2021   HGB 14.6 11/11/2021   HCT 43.7 11/11/2021   MCV 89 11/11/2021   PLT 171 11/11/2021      Component Value Date/Time   NA 140 11/11/2021 1513   K 3.9 11/11/2021 1513   CL 104 11/11/2021 1513  CO2 23 11/11/2021 1513   GLUCOSE 110 (H)  11/11/2021 1513   GLUCOSE 95 05/27/2021 1240   BUN 12 11/11/2021 1513   CREATININE 0.69 11/11/2021 1513   CREATININE 0.69 01/27/2021 1700   CALCIUM 9.1 11/11/2021 1513   PROT 7.2 11/11/2021 1513   ALBUMIN 4.4 11/11/2021 1513   AST 13 11/11/2021 1513   ALT 11 11/11/2021 1513   ALKPHOS 55 11/11/2021 1513   BILITOT 0.4 11/11/2021 1513   GFRNONAA >60 05/27/2021 1240   GFRNONAA 107 01/27/2021 1700   GFRAA 124 01/27/2021 1700   Lab Results  Component Value Date   CHOL 170 10/29/2020   HDL 58 10/29/2020   LDLCALC 88 10/29/2020   LDLDIRECT 114.9 04/30/2012   TRIG 139 10/29/2020   CHOLHDL 2.9 10/29/2020   Lab Results  Component Value Date   HGBA1C 5.9 (H) 01/27/2021   Lab Results  Component Value Date   IPJASNKN39 767 01/27/2021   Lab Results  Component Value Date   TSH 0.01 (L) 04/12/2021     No flowsheet data found.   No flowsheet data found.   ASSESSMENT AND PLAN  41 y.o. year old female  has a past medical history of Anemia, Anxiety, Constipation, Depression, Fibromyalgia, GERD (gastroesophageal reflux disease), Hemorrhoids, HSV-1 infection, Hypertension, Hypothyroidism, Idiopathic hypersomnia, OSA on CPAP, Other abnormal glucose, and Preeclampsia. here with     Obstructive sleep apnea treated with continuous positive airway pressure (CPAP) - Plan: For home use only DME continuous positive airway pressure (CPAP)  Hypersomnia, persistent    Continue [name] 30 mg daily - did discuss trying to take around 7am to see if this helps with afternoon fatigue, if no benefit, will further discuss with Dr. Brett Fairy to possibly consider use of [name] IR in the afternoon.  Continue Xywav 4.5 g at 8pm and 12am which has helped significantly with hypersomnia. She is aware of appropriate administration and to keep this medication secured at all times.  Lab work completed at prior visit 3 months ago which was satisfactory.   She will continue BiPAP with excellent usage and  optimal residual AHI.  Continue to follow with DME company for any needed supplies or CPAP related concerns. Hopefully as she continues to use BiPAP with now good treatment of apnea, this can further help with her fatigue. Advised to f/u with DME company regarding leaking around full face mask - may need padding/seal replacements She will continue close follow up with PCP and psychiatry.  Will follow up with Dr. Brett Fairy to see if there are any other recommendations for continued fatigue    Orders Placed This Encounter  Procedures   For home use only DME continuous positive airway pressure (CPAP)    Continue BiPAP under IPAP 17 and EPAP 10 with RR 10 Ongoing supplies as indicated    Order Specific Question:   Length of Need    Answer:   Lifetime    Order Specific Question:   Patient has OSA or probable OSA    Answer:   Yes    Order Specific Question:   Is the patient currently using CPAP in the home    Answer:   Yes    Order Specific Question:   If yes (to question two)    Answer:   Determine DME provider and inform them of any new orders/settings    Order Specific Question:   Settings    Answer:   Other see comments    Order Specific Question:  CPAP supplies needed    Answer:   Mask, headgear, cushions, filters, heated tubing and water chamber     No orders of the defined types were placed in this encounter.    I spent 37 minutes of face-to-face and non-face-to-face time with patient.  This included previsit chart review, lab review, study review, order entry, electronic health record documentation, patient education and discussion regarding review and discussion of BiPAP report and related concerns as above, persistent hypersomnia and review of current treatment plan and possible future treatment plan and answered all other questions to patient satisfaction  Frann Rider, [facility]  [facility] [address] [place], [place] [address]  Phone  [phone] Fax [phone] Note: This document was prepared with digital dictation and possible smart phrase technology. Any transcriptional errors that result from this process are unintentional.

## 2022-04-11 ENCOUNTER — Telehealth: Payer: Self-pay | Admitting: Adult Health

## 2022-04-11 NOTE — Telephone Encounter (Signed)
Lauren [name] has appt's open next week you can offer or convert. Please see why she is requesting a sooner appt. New issues? Continuing problems? Ect? Document in note. Thank you!

## 2022-04-11 NOTE — Telephone Encounter (Signed)
Would like a call from the nurse to discuss a sooner appt than September.

## 2022-04-12 NOTE — Telephone Encounter (Signed)
Pt said having fatigue for a month due hypersomnia, hard to function have to go lay down. Seen PCP, and blood work came back normal, recommended to follow up with neurologist

## 2022-04-13 NOTE — Telephone Encounter (Signed)
Follow up scheduled for 04/19/22.

## 2022-04-19 ENCOUNTER — Ambulatory Visit: Payer: BC Managed Care – PPO | Admitting: Adult Health

## 2022-04-26 ENCOUNTER — Telehealth: Payer: Self-pay | Admitting: Adult Health

## 2022-04-26 ENCOUNTER — Other Ambulatory Visit: Payer: Self-pay

## 2022-04-26 MED ORDER — XYWAV 500 MG/ML PO SOLN: ORAL | 5 refills | Status: DC

## 2022-04-26 NOTE — Telephone Encounter (Signed)
[facility]) request refill for Ca, Mg, K, and Na Oxybates (XYWAV) 500 MG/ML SOLN at [facility].

## 2022-04-26 NOTE — Telephone Encounter (Signed)
[place] [facility] checked, pt last filled 04/06/22. Error in filling Rx, script printed and was voided. Rx can be filled closer to the 17th.

## 2022-05-23 ENCOUNTER — Other Ambulatory Visit: Payer: Self-pay | Admitting: Adult Health

## 2022-05-23 MED ORDER — XYWAV 500 MG/ML PO SOLN: ORAL | 5 refills | Status: DC

## 2022-05-23 NOTE — Addendum Note (Signed)
Addended by: Melanee Spry on: 05/23/2022 03:59 PM   Modules accepted: Orders

## 2022-05-23 NOTE — Telephone Encounter (Signed)
[facility]) rerquest refill for Ca, Mg, K, and Na Oxybates (XYWAV) 500 MG/ML SOLN at  [facility] [employer]

## 2022-05-23 NOTE — Telephone Encounter (Signed)
[place] [facility] checked, last filled 05/06/22. Refill is appropriate on my end. Please fill as work-in MD.

## 2022-05-26 ENCOUNTER — Other Ambulatory Visit: Payer: Self-pay | Admitting: Neurology

## 2022-05-26 ENCOUNTER — Telehealth: Payer: Self-pay | Admitting: Adult Health

## 2022-05-26 MED ORDER — XYWAV 500 MG/ML PO SOLN: ORAL | 5 refills | Status: DC

## 2022-05-26 NOTE — Telephone Encounter (Signed)
Refill completed, once signed will send to [facility] pharmacy

## 2022-05-26 NOTE — Telephone Encounter (Signed)
[facility]) request refill for Ca, Mg, K, and Na Oxybates (XYWAV) 500 MG/ML SOLN at  [facility]

## 2022-05-30 ENCOUNTER — Encounter: Payer: Self-pay | Admitting: Neurology

## 2022-06-02 ENCOUNTER — Ambulatory Visit (INDEPENDENT_AMBULATORY_CARE_PROVIDER_SITE_OTHER): Payer: BC Managed Care – PPO | Admitting: Neurology

## 2022-06-02 ENCOUNTER — Encounter: Payer: Self-pay | Admitting: Neurology

## 2022-06-02 VITALS — BP 129/83 | HR 84 | Ht 60.0 in | Wt 162.0 lb

## 2022-06-02 DIAGNOSIS — R5382 Chronic fatigue, unspecified: Secondary | ICD-10-CM | POA: Diagnosis not present

## 2022-06-02 DIAGNOSIS — M791 Myalgia, unspecified site: Secondary | ICD-10-CM

## 2022-06-02 DIAGNOSIS — R5381 Other malaise: Secondary | ICD-10-CM | POA: Diagnosis not present

## 2022-06-02 NOTE — Progress Notes (Signed)
SLEEP MEDICINE CLINIC   Provider:  Larey [name], M D  Primary [name] Physician:  [name], [place]   Referring Provider: [name], PA   Chief Complaint  Patient presents with   Obstructive Sleep Apnea    Rm 10, alone. Here to f/u for sleep concerns. Pt c/o of being tired all day even while on BiPAP, Xywav, and [name].  She feels that her fatigue is paralyzing her and is a barrier to participate in professional development social activities family life.  She has lost weight which may possibly be supported by Donney Rankins but she has truly made differences in her diet and nutrition regimens.     HPI:   Rv with MD on 06-02-2022: Lauren [name] has followed here compliantly as a patient with a chief concern of hypersomnia.  She is also treated for sleep apnea.  Her last 3 visits were with nurse practitioners here in our office and she had still voiced a concern of hypersomnia being present while her sleep apnea was treated.  This persistent hypersomnia is not longer a concern- see EDS- more the fatigue severity-  expressed as a severe fatigue. Tremors have much improved after psychiatrist changed her from [place]. She had been over a year on [name] when she developed tremors. Now on Spravato (?) , not entered into Epic.  SPRAVATO. LFTs have remained normal.  Fatigue is what is her chief concern , not sleepiness. FSS at 59/ 63 points- on Adderral and on xywav.  Epworth today on XyWAV 4.5 g bin at 3/ 24 points. Good results for apnea resolution on BiPAP.    Today's BiPAP shows 100% compliance for the last 30 days actually looking back the last 90 days and an average of 7-1/2 hours of BiPAP use each night.  The inspiratory pressure is set at 17 expiratory pressure at 10 and she has an ST rate so-called respiratory capture rate of 10/min so she is using a BiPAP S10 with a resolution of apnea to an AHI or apnea hypopnea index, of 0.5/h with minimal air leak.    So I cannot state that  neither apnea nor idiopathic hypersomnia are at this time responsible for her fatigue and that there has to be another source.  Her apnea is optimally treated and her sleepiness has decreased on Xywav. The patient permittit me access to her [facility] through her smart phone ; dictated 06-02-2022:  The patient's basic metabolic profile at [facility]  obtained yesterday 01 June 2022   showed normal fasting glucose BUN of 15 creatinine 0.77 mg/dL.  Glomerular filtration rate 98 which is excellent.  Sodium 139 mmol potassium 4.5 mmol chloride 104 mmol CO2 27 mmol anion gap 12.6 mmol calcium 9.9 mg/dL albumin 4.7 g/dL liver function tests were not included.  He has a second a TSH thyroid-stimulating hormone wasat 2.34 international unit/mL this is also in normal range.  A CBC with differential did not show elevated white blood cell counts, no evidence of anemia.  For patient with a chronic fatigue component I will add a rheumatological panel, CK, we will also look at T3 and T4 with uptake, there has been no history of neoplasm in the past the patient has never suffered from a cancer or leukemia.    Update 01/31/2022 JM: 41 year old female who returns for initial BiPAP compliance visit and hyperinsomnia.  Continues on Xywav 4.5g at 8pm and 12am and [name] 30 mg daily. Main complaint is continued daytime sleepiness, she will  try not take her [name] on the weekends as she is hopeful this will help have more effect during the week days but she has great difficulty with fatigue if she doesn't take on the weekends and has not noticed improvement of afternoon fatigue. At times will wake up not feeling refreshed. She usually takes [name] between 5-6AM. 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 Fatigue severity scale 56/63 (prior 60 12/2020), Epworth Sleepiness Scale 0 (prior 5 10/2021).  Routinely followed by psychiatry who recently discontinued [name] due to tremors and tardive dyskinesias  and started on Ingrezza and amantadine with symptoms gradually improving.  Continues to work with PCP correcting hypothyroidism, most recent TSH improved although still slightly elevated.    She received new BiPAP machine back in January with compliance report over the past 30 days showing excellent compliance at 100% (total and >4hrs usage) and optimal residual AHI 0.2.  Leaks in the 95th percentile 6.9.  IPAP 17, EPAP 10. Prior AHI on CPAP at 12.6! Overall tolerating BiPAP well except more recently experiencing mask leak around bridge of her nose.   11/11/21 ALL:  Lauren [name] is a 41 y.o. female here today for follow up for idiopathic hypersomnia. She has been compliant on CPAP therapy but AHI not well managed. BiPAP has been ordered but she is waiting on DME to call with machine (currently backordered). She still has more fatigue in the evenings.    She continues Xywav 4.5g twice nightly. First dose when in bed at 8pm. Second dose usually around 12am. She usually wakes around 5am. She does not wake feeling refreshed. She is following closely with PCP. TSH was high and recent adjustments to synthroid were made. She has not received new dose from pharmacy.    Lauren [name] continues to follow closely. She continues lithium. She feels mood is stable although she has felt a little more irritable that she normally does.     Rv 01-12-2021; with husband, here with persistent hypersomnia and extreme fatigue, no cardiac disease, no diabetes, but elevated BP.   Has dizzyspells.lightheaded but positive romberg, staring spells, weakness spells. No loss of awareness.   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  She is not functioning at work and feels overwhelmed, and she is afraid she can't find another job. Thi sounds more like depression.      RV 08-12-2020: I have the pleasure of meeting with Lauren [name] today 3  months after she underwent a new attempt at a CPAP directed baseline polysomnography with an MSLT to follow.  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  She endorsed the Epworth Sleepiness Scale between 16 and 14 points at various times, she had no apnea using CPAP in the nightly study REM latency was still 60 minutes to my surprise, the multiple sleep latency test showed sleep onset with an average of 8 minutes which is a central sleep latency than desired however we were again unable to find any REM latencies or REM sleep onset.  Clinically the patient presents with symptoms that are very reminiscent of narcolepsy and there is a possibility that this is an idiopathic hypersomnia that still can convert to early REM onset.  The patient had appropriately weaned off all REM suppressing medication prior to the test.  We discussed today to continue  [name] which gave her a better daytime control of sleepiness without inducing insomnia at night.  She still feels that after lunch her concentration and wakefulness is decreasing.   And we discussed today to add in [name] form to help her through the day.  We also discussed that [name] and the highway for now have a new indication for idiopathic hypersomnia and excessive daytime sleepiness in patients such as Lauren [name] for but have not been able to be diagnosed with narcolepsy by the gold standard test of MSLT.  I also discussed the medication in detail about how to receive by mail, and that it has to be in a cool place away from food, but it has to be taken twice at night and that it induces a very deep sleep indeed sometimes with great difficulties to arouse the patient should that be any alarm or any situation coming up.  The patient lives with her spouse and teenage daughter and her 53 year old daughter and I think that her home environment is conducive to try anxiety with if the need arises.   So today I will add 10 mg XR [name] in addition to [facility] and I will let her tell me in 4 to 8 weeks if this is going well otherwise we will change her to [name] wave.     RV 04-01-2020 :  Lauren [name] is a 41 y.o. female , and seen here in a Rv  for a hypersomnia evaluation- RN : pt with husband, rm 85. presents today as a follow up. DME [facility] -CPAP  is working well. She is still having concerns of daytime sleepiness. states that she will take the [name]  in the morning which it helps but still feels tired.  she will take the immediate release at lunch time and will again helps her stay alert until 7/7:30 pm but she feels the medication is allowing her to be able to function but she still feels tired.   The patient has been an excellent user of CPAP she is using an auto titration device between 5 and 12 cm of water pressure with 3 cm EPR and her 95th percentile pressure is 7.3 cm water.  Residual AHI is 0.2 which is an excellent resolution of any apnea.  She is 100% compliant for 30 out of 30 days of use and 97% compliance with 4 hours of use.  She had only 1 day where she did not use 4 hours consecutively.  The average user time is 8 hours and 3 minutes at night there are very minor air leaks overall this is very effective therapy but the past to my concern and the patient's not helped to make her less sleepy in daytime.  She remains on [name] with an Epworth Sleepiness Scale result of 12 points today. There has been no interval medical history-  She takes 20 mg [name] and 10 mg in PM.  She is a very light sleeper.  She feels that CPAP prevents her from waking up as frequently. She averages 8- 10 hours of sleep. She takes an iron supplement- her % saturation was only 10% in 10-2019.  Component 2 yr ago  DQA1*01:02 Positive   DQB1*06:02 Positive   Comment: Final Results:  DQA1*01:AXZVC,03:RV    MSLT may be next step now- she can wean off medication that depresses REM  sleep.  We need to wean off [name], [name], [name],   In preparation for an MSLT during the summer break  I would like for the patient to temporarily take [name] and extended release so that she can use it on a as needed basis and it would be sufficient for a valid MSLT to discontinue the use 8 days prior to the sleep test.  As to [name] the patient only uses 5 mg which is a health tablet at her currently prescribed dose of 10 mg tablets.  She can go to every other day 5 mg for 1 week and then should be able to stay off [name] for another 10 days prior to test.  It is very important that we schedule the MSLT so that these steps can be planned correctly in advance, I have to speak to Lauren. Robina Ade about the [name] and how to best be met and not as familiar with the medication and its half-life.  Since it is also a tablet it should be possible to cut the dose in half and go to every other day but I will confirm with Lauren. Buddy Duty that this is what she permits me to do.  My goal is to have the MSLT and early mid June.  So that we have time to wean during the school summer break.    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  Lauren [name] underwent a sleep study on 19 January 2018 at the time she was treated with Xanax, Pristiq, Latuda Pravachol and Synthroid.  She also had a BMI of 37 and an Epworth sleepiness scale was endorsed at 11 points.  She experienced temporary spells of weakness and daily hypersomnia.  Her AHI was 3.1/h  the REM AHI was 7.5 usually these levels of sleep apnea are not considered clinically significant.  However there was a quite loud primary snoring noted and the patient still had insomnia and hypersomnia concerns.  The patient decided to try out CPAP, and she has been compliant she has been using the machine 27 out of 30 days with a 76% compliance for hours average user time 5 hours 33 minutes AutoSet is between 5 and 12 cmH2O with 3 cm EPR and her AHI was reduced to 0.3 which  is a further successful reduction of an already mild apnea it did not have any effect on her sleepiness.  A repeat laboratory test for review with lymphocytic antigen HLA DQ A1 and DQ B1 was obtained on 13 January by Howell Rucks and she to turn positive for both alleles alleles.  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   We discuss the process how to obtain a valid MSLT ;she ried to wean off latuda and failed, she couldn't work , not able to function. She is not sure she likes her CPAP and given the w baseline AHI I am not convinced it can change much. However, the diagnosis of OSA can help [place] to start her on modafinil.  She has been using [name] with Lauren Malachy Moan order, she has seen Vicie Mutters, PA.   I will order modafinil or Armodafinil. I will establish a weaning schedule for her REM supressant medications. If she cannot use CPAP 75-80% of the time compliantly , it is not worth to continue.    Lauren. Jannifer Franklin patient ,  I had the pleasure of meeting with [facility] before about 6-1/2 years ago she had undergone a sleep study at [facility] sleep.  She has been followed by Lauren. Jannifer Franklin at the time who also ordered a sleep study.  In the meantime  she has been followed by her primary [name] physician, and has seen Lauren. Adele Schilder.  She has a diagnosis of hypothyroidism, fibromyalgia, excessive fatigue.  She reports that she had been under more stress at work and at home.  The patient is also a mother of 2 with her youngest son will be 97 years old.  After the birth of either she experienced temporary spells of weakness.  With each of the deliveries she had received magnesium, which can be the most likely cause for perinatal weakness.  Chief complaint according to patient : "depression, leaden fatigue, weight gain, and myalgia" I sometimes feel it coming down on me and I just need to sleep, have taken 2 naps while waiting here."  Sleep habits are as follows: The patient usually goes to bed around  8:30 PM, she takes Xanax at bedtime and is promptly asleep within 20-30 minutes.  The bedroom is cool, quiet and dark.  She prefers to sleep on her side or prone , and sleeps on one pillow.  I sleeps through the night, wakes up at 6 AM, on weekends she stays much longer in bed -until 10 AM. She estimates 9 hours of sleep on average. She wakes without headaches or dry mouth. She dreams a lot, has one or none bathroom break. Her husband noted her just to have started to snore, and suspected apnea.  She naps frequently in daytime. She works as a Pharmacist, hospital in Probation officer. Her naps are 3-5 minutes long.  Excessive daytime sleepiness and fatigue, 3-5 minute power naps, wakes up from her own snoring. Sleep paralysis in the past, none recent. Immediate dreams when napping. She cannot recall cataplectic spells.    Sleep medical history and family sleep history:  Excessive daytime sleepiness and fatigue, 3-5 minute power naps, wakes up from her own snoring. Mother is sleepy, sons are not.   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 Daughters  age 83  and 36. No smoking history, No ETOH, caffeine - two a day.   Review of Systems: Out of a complete 14 system review, the patient complains of only the following symptoms, and all other reviewed systems are negative.  snoring, weight gain, fatigued.     06-02-2022 Now on BIPAP and Xywav her ESS is endorsed at 3 points, form 15 points pre evaluation , 12 points on adderall, and 9 points on SONUSI,  Xywav now. Still   Fatigue severity score 60  , depression score n/a - [name] works for 3 hours only.  Lauren [name] changed IR from XR form , without effect.   How likely are you to doze in the following situations: 0 = not likely, 1 = slight chance, 2 = moderate chance, 3 = high chance  Sitting and Reading? Watching Television? Sitting inactive in a public place (theater or meeting)? Lying down in the afternoon when circumstances  permit? Sitting and talking to someone? Sitting quietly after lunch without alcohol? In a car, while stopped for a few minutes in traffic? As a passenger in a car for an hour without a break?  3/ 24   FSS at 59 / 63 points.    Laboratory tests through [facility]: 0 Result Notes           Component Ref Range & Units 1 yr ago (05/15/21) 1 yr ago (01/27/21) 1 yr ago (12/31/20) 1 yr ago (10/29/20) 1 yr ago (10/07/20) 1 yr ago (08/17/20) 1 yr ago (07/01/20)  Sodium 135 -  145 mmol/L 139  141 R  139 R  138 R  138 R  137 R  137 R   Potassium 3.5 - 5.1 mmol/L 3.1 Low   4.0 R  4.0 R  4.2 R  4.2 R  4.2 R  4.5 R   Chloride 98 - 111 mmol/L 104  105 R  105 R  103 R  101 R  102 R  103 R   CO2 22 - 32 mmol/L 24  26 R  27 R  27 R  25 R  27 R  26 R   Glucose, Bld 70 - 99 mg/dL 86  106 High  R, CM  83 R, CM  95 R, CM  91 R, CM  168 High  R, CM  131 High  R, CM   Comment: Glucose reference range applies only to samples taken after fasting for at least 8 hours.  BUN 6 - 20 mg/dL 14  9 R  9 R  15 R  15 R  16 R  19 R   Creatinine, Ser 0.44 - 1.00 mg/dL 0.74  0.69 R  0.67 R  0.74 R  0.70 R  0.70 R  0.70 R   Calcium 8.9 - 10.3 mg/dL 9.0  9.2 R  9.5 R  9.5 R  9.7 R  9.3 R  9.9 R   GFR, Estimated >60 mL/min >60         Comment: (NOTE)  Calculated using the CKD-EPI Creatinine Equation (2021)   Anion gap 5 - 15 11         Comment: Performed at [facility], [place] [address] [place], [place] [address]  Resulting Agency  [facility] CLIN LAB [place] [facility]         Specimen Collected: 05/15/21 13:39 Last Resulted: 05/15/21 14:31 08/01/2022 at 09:30 AM in Neurology Lauren Partridge Izaih Kataoka, MD)    0 Result Notes           Component Ref Range & Units 1 yr ago (05/15/21) 1 yr ago (01/27/21) 1 yr ago (12/31/20) 1 yr ago (10/29/20) 1 yr ago (10/07/20) 1 yr ago (08/17/20) 1 yr ago (07/01/20)  WBC 4.0 - 10.5 K/uL 9.9  8.7 R  8.4 R  10.7 R  10.4 R  11.2 High  R  11.0 High  R   RBC 3.87 - 5.11 MIL/uL  5.07  4.59 R  4.77 R  4.79 R  4.71 R  4.69 R  4.77 R   Hemoglobin 12.0 - 15.0 g/dL 14.6  13.7 R  14.3 R  14.2 R  13.8 R  13.7 R  13.8 R   HCT 36.0 - 46.0 % 43.8  40.7 R  42.1 R  42.4 R  41.1 R  41.3 R  41.8 R   MCV 80.0 - 100.0 fL 86.4  88.7  88.3  88.5  87.3  88.1  87.6   MCH 26.0 - 34.0 pg 28.8  29.8 R  30.0 R  29.6 R  29.3 R  29.2 R  28.9 R   MCHC 30.0 - 36.0 g/dL 33.3  33.7 R  34.0 R  33.5 R  33.6 R  33.2 R  33.0 R   RDW 11.5 - 15.5 % 12.2  12.0 R  12.4 R  12.6 R  12.9 R  12.6 R  13.2 R   Platelets 150 - 400 K/uL 186  184 R  168 R  193  R  168 R  171 R  180 R   nRBC 0.0 - 0.2 % 0.0         Comment: Performed at [facility], [place] [address], [place], [place] [address]  MPV   12.0 R  11.8 R  11.5 R  11.9 R  11.8 R  11.1 R   Eosinophils Relative   0.6 R  0.7 R  0.7 R  0.4 R  0.5 R  0.1 R   Basophils Relative   0.2 R  0.2 R  0.2 R  0.2 R  0.2 R  0.2 R   Neutro Abs   5,794 R  5,376 R  6,859 R  6,594 R  7,213 R  8,415 High  R   Lymphs Abs   2,288 R  2,293 R  3,071 R  3,026 R  3,304 R  2,068 R   Absolute Monocytes   548 R  655 R  674 R  718 R  605 R  484 R   Eosinophils Absolute   52 R  59 R  75 R  42 R  56 R  11 Low  R   Basophils Absolute   17 R  17 R  21 R  21 R  22 R  22 R   Neutrophils Relative %   66.6 R  64 R  64.1 R  63.4 R  64.4 R  76.5 R   Total Lymphocyte   26.3 R  27.3 R  28.7 R  29.1 R  29.5 R  18.8 R   Monocytes Relative   6.3 R  7.8 R  6.3 R  6.9 R  5.4 R  4.4 R   Resulting Agency  [facility]         Specimen Collected: 05/15/21 13:39 Last Resulted: 05/15/21 13:56      Lab Flowsheet    Order Details    View Encounter    Lab and Collection Details    Routing    Result History    View All Conversations on this Encounter      R=Reference range differs from displayed range      Result [name] Coordination   Patient Communication   Add Comments   Seen Back to Top       Other Results from 05/15/2021  Troponin I  (High Sensitivity) Order: [id number] Status: Final result    Visible to patient: Yes (seen)    Next appt: 08/01/2022 at 09:30 AM in Neurology Lauren Partridge Jahliyah Trice, MD)    0 Result Notes      Component Ref Range & Units 1 yr ago (05/15/21) 1 yr ago (05/15/21)  Troponin I (High Sensitivity) <18 ng/L 2  <2 CM   Comment: (NOTE)  Elevated high sensitivity troponin I (hsTnI) values and significant  changes across serial measurements may suggest ACS but many other  chronic and acute conditions are known to elevate hsTnI results.  Refer to the "Links" section for chest pain algorithms and additional  guidance.  Performed at [name], [place] [address]  [place], [place] [address]   Resulting Agency  [facility] CLIN LAB                Social History   Socioeconomic History   Marital status: Married    Spouse name: Not on file   Number of children: 2   Years of education: Not on file  Highest education level: Not on file  Occupational History   Occupation: TEACHER    Employer: [employer]  Tobacco Use   Smoking status: Never   Smokeless tobacco: Never  Vaping Use   Vaping Use: Never used  Substance and Sexual Activity   Alcohol use: Not Currently    Comment: occ   Drug use: No   Sexual activity: Yes    Partners: Male    Birth control/protection: Surgical    Comment: husband has had a vasectomy  Other Topics Concern   Not on file  Social History Narrative   4 caffeine drinks daily    Social Determinants of Health   Financial Resource Strain: Medium Risk (08/25/2021)   Overall Financial Resource Strain (CARDIA)    Difficulty of Paying Living Expenses: Somewhat hard  Food Insecurity: No Food Insecurity (08/25/2021)   Hunger Vital Sign    Worried About Running Out of Food in the Last Year: Never true    Ran Out of Food in the Last Year: Never true  Transportation Needs: No Transportation Needs (08/25/2021)   PRAPARE - Radiographer, therapeutic (Medical): No    Lack of Transportation (Non-Medical): No  Physical Activity: Insufficiently Active (08/25/2021)   Exercise Vital Sign    Days of Exercise per Week: 3 days    Minutes of Exercise per Session: 30 min  Stress: Stress Concern Present (08/25/2021)   [place]    Feeling of Stress : To some extent  Social Connections: Moderately Integrated (08/25/2021)   Social Connection and Isolation Panel [NHANES]    Frequency of Communication with Friends and Family: More than three times a week    Frequency of Social Gatherings with Friends and Family: Once a week    Attends Religious Services: More than 4 times per year    Active Member of [employer] or Organizations: No    Attends Archivist Meetings: Never    Marital Status: Married  Human resources officer Violence: Not At Risk (08/25/2021)   Humiliation, Afraid, Rape, and Kick questionnaire    Fear of Current or Ex-Partner: No    Emotionally Abused: No    Physically Abused: No    Sexually Abused: No    Family History  Problem Relation Age of Onset   Hypothyroidism Mother    Cervical cancer Mother    Bipolar disorder Mother    Diabetes Mother    Colon polyps Mother    Bipolar disorder Brother    Kidney disease Brother    Alcohol abuse Brother    Hypothyroidism Daughter    Heart disease Maternal Grandmother    Diabetes Maternal Grandmother    Cancer Maternal Grandmother        SKIN   Hyperlipidemia Maternal Grandmother    Hypertension Maternal Grandmother    Hypothyroidism Maternal Grandmother    Lung cancer Maternal Grandfather    Heart disease Maternal Aunt    Diabetes Maternal Aunt    Diabetes Cousin    Colon cancer Neg Hx     Past Medical History:  Diagnosis Date   Anemia    Anxiety    Constipation    Depression    Fibromyalgia    GERD (gastroesophageal reflux disease)    Hemorrhoids    HSV-1 infection    Hypertension     Hypothyroidism    Idiopathic hypersomnia    OSA on CPAP    Other abnormal glucose  Preeclampsia     Past Surgical History:  Procedure Laterality Date   ANAL RECTAL MANOMETRY N/A 06/21/2021   Procedure: ANO RECTAL MANOMETRY;  Surgeon: Ileana Roup, MD;  Location: [facility] ENDOSCOPY;  Service: General;  Laterality: N/A;   CARPAL TUNNEL RELEASE Right 02/2020   CHOLECYSTECTOMY  2006   thumb surgery Bilateral    trigger thumb   TONSILLECTOMY     WISDOM TOOTH EXTRACTION      Current Outpatient Medications  Medication Sig Dispense Refill   amphetamine-dextroamphetamine ([name]) 30 MG 24 hr capsule Take 30 mg by mouth every morning.     Ca, Mg, K, and Na Oxybates (XYWAV) 500 MG/ML SOLN Take 4.5 G twice nightly 540 mL 5   Cholecalciferol (VITAMIN D3) 125 MCG (5000 UT) CAPS Take 1 capsule by mouth daily.     Iron-Vitamin C 65-125 MG TABS Take by mouth daily.     L-Methylfolate-Algae (DEPLIN 15 PO) Take by mouth.     levothyroxine (SYNTHROID) 125 MCG tablet Take 125 mcg by mouth every morning.     lithium carbonate (ESKALITH) 450 MG CR tablet Take 1 tablet by mouth at bedtime.     rosuvastatin (CRESTOR) 5 MG tablet Take  1 tablet  Daily  for Cholesterol (Patient taking differently: Takes 1 tab on Monday, Wednesday and Friday) 90 tablet 3   valbenazine (INGREZZA) 80 MG capsule Take 80 mg by mouth daily.     vitamin E 100 UNIT capsule Take by mouth daily. Takes 176m     vortioxetine HBr ([name]) 20 MG TABS tablet Take 20 mg by mouth daily.     No current facility-administered medications for this visit.    Allergies as of 06/02/2022 - Review Complete 06/02/2022  Allergen Reaction Noted   Lamictal [lamotrigine] Rash 02/11/2016   Doxycycline Other (See Comments) 09/19/2013   Effexor [venlafaxine] Other (See Comments) 09/19/2013   Savella [milnacipran hcl] Other (See Comments) 09/19/2013   Prilosec [omeprazole] Itching 11/21/2019    Vitals: BP 129/83   Pulse 84   Ht 5'  (1.524 m)   Wt 162 lb (73.5 kg)   BMI 31.64 kg/m  Last Weight:  Wt Readings from Last 1 Encounters:  06/02/22 162 lb (73.5 kg)   BWVP:XTGGmass index is 31.64 kg/m.     Last Height:   Ht Readings from Last 1 Encounters:  06/02/22 5' (1.524 m)    Physical exam:  General: The patient is awake, alert and appears in some distress. The patient is well groomed. Head: Normocephalic, atraumatic.  Neck is supple, ROM intact, no dizziness with these movements. . Mallampati 3 and pale mucosa. ,  neck circumference: 15,5 "  Nasal airflow congested , Retrognathia is not seen.  She wore braces and retainer, now bruxism mouth guard.  Cardiovascular:  Regular rate and rhythm, without  murmurs or carotid bruit, and without distended neck veins. Respiratory: Lungs are clear to auscultation. Skin:  Without evidence of edema, or rash Trunk: BMI is 37 .2 Neurologic exam : The patient is awake and alert, oriented to place and time.   Attention span & concentration ability appears normal.  Speech is slowed but  fluent, meek-  With some new dysphonia. She reports word-finding problems and mixing up words. Mood and affect are depressed and anxious.   Cranial nerves: Intact sense of taste and smell.  Pupils are equal and briskly reactive to light. EOM intact, no nystagmus no restriction of visual field,/  Facial motor strength is symmetric and  tongue and uvula move midline.  Shoulder shrug was symmetrical.  Motor exam:  Normal tone, muscle bulk and symmetric strength in all extremities.good grip strength.  Sensory: vibration intact.  Coordination: Rapid alternating movements in the fingers/hands were normal. Handwriting has changed- she is micrographic and has mild tremor.  Finger-to-nose maneuver  normal without evidence of ataxia, dysmetria or tremor. Slowed.  Gait and station: Patient walks without assistive device.  The patient is able to rise from her chair without assistance, she did use her arms  to brace herself.  She was able to walk up normal speed by not performing any specific specific maneuver, her tandem gait was slower and she walks with outstretched arms to balance herself.  She truly had some difficulties with heel and toe walk more with heel walk.  Her Romberg was positive and she sway back and forth, but her eyes are closed she seem to have extremities to pro- pulse, to  fall forward.  Deep tendon reflexes: in the  upper and lower extremities are  Intact, 1 plus.  Babinski down going, tight achilles tendon noted.   Assessment:  After physical and neurologic examination, review of laboratory studies,  Personal review of imaging studies, reports of other /same  Imaging studies, results of polysomnography and / or neurophysiology testing and pre-existing records as far as provided in visit., my assessment is ;   1) UARS snoring, mild OSA -Mrs. Swafford has noted more sleep continuity on CPAP.  Her residal AHI  is excellent . Her sleepiness didn't change . Again she is highly compliant has used the CPAP at 90%, with an average daily use at time of 6 hours 28 minutes the settings are between 5 and 12 cmH2O with 3 cm EPR the residual AHI AHI is 0.1/h which speaks for an excellent resolution of apnea pressure at the 95th percentile is 7.7 cm water.  She sometimes has used the machine a little less on weekends than on weekdays.  Overall compliance is sufficient.  Epworth Sleepiness Scale remains at 16 points fatigue severity scale remains at 61 points.    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  Her last  MSLT had no REM sleep onsets in 5 naps.  Ideopathic Hypersomnia?  I suspect she can convert to narcolepsy any time. [name] has helped with managing the sleepiness but she is extremely fatigued. [name] did not help at 10 mg, and increased step by step to 50 mg a day. Lauren [name] has titrated.      2)  spells of vertigo, dizziness, no falls. MRI brain was normal , c/s contrast agent . No incontinence , no LOC,  But neck pain and HTN . This are possible clues for a cervical spine involvement?   3) appears depressed. May explain the fatigue.  .     I spent more than 35  minutes of face to face time and preparation time with the patient.  Greater than 50% of time was spent in counseling and coordination of [name]. We have discussed the diagnosis and differential and I answered the patient's questions.    Plan:  Treatment plan and additional workup : [name] continued. BiPAP ST to be continued.  Ordered rheumatological panel.   As discussed, [name]/ Donney Rankins has to be taken with very mindful caution: Taking [name]/ Xywav  correctly is key. This means, take it only when you are fully ready to  fall asleep, while in bed and refrain from doing any other activities, even brushing  your teeth after taking your first dose. The second dose will be about 2-1/2-4 hours after his first dose.  You can go to the bathroom before your 2nd dose. Take your first dose, when actually IN BED, ready to sleep.  No sitting up in bed, NO reading, NO using the cell phone or computer, NO getting up to use the bathroom. Take [name] of everything BEFORE sleep time. Try NOT to skip the second dose as the [name]/ Donney Rankins is not going to stay in your system long enough with only one dose. Do not drink alcohol with [name]/ Donney Rankins . If you do drink Alcohol, you cannot take your [name]/ Xywav doses that night.    Lauren Seat, MD 1/95/9747, 18:55 AM  Certified in Neurology by ABPN Certified in [place] by [facility] [address] [place], [place] [address]

## 2022-06-02 NOTE — Patient Instructions (Addendum)
Fatigue If you have fatigue, you feel tired all the time and have a lack of energy or a lack of motivation. Fatigue may make it difficult to start or complete tasks because of exhaustion. Occasional or mild fatigue is often a normal response to activity or life. However, long-term (chronic) or extreme fatigue may be a symptom of a medical condition such as: Depression. Not having enough red blood cells or hemoglobin in the blood (anemia). A problem with a small gland located in the lower front part of the neck (thyroid disorder). Rheumatologic conditions. These are problems related to the body's defense system (immune system). Infections, especially certain viral infections. Fatigue can also lead to negative health outcomes over time. Follow these instructions at home: Medicines Take over-the-counter and prescription medicines only as told by your health care provider. Take a multivitamin if told by your health care provider. Do not use herbal or dietary supplements unless they are approved by your health care provider. Eating and drinking  Avoid heavy meals in the evening. Eat a well-balanced diet, which includes lean proteins, whole grains, plenty of fruits and vegetables, and low-fat dairy products. Avoid eating or drinking too many products with caffeine in them. Avoid alcohol. Drink enough fluid to keep your urine pale yellow. Activity  Exercise regularly, as told by your health care provider. Use or practice techniques to help you relax, such as yoga, tai chi, meditation, or massage therapy. Lifestyle Change situations that cause you stress. Try to keep your work and personal schedules in balance. Do not use recreational or illegal drugs. General instructions Monitor your fatigue for any changes. Go to bed and get up at the same time every day. Avoid fatigue by pacing yourself during the day and getting enough sleep at night. Maintain a healthy weight. Contact a health care  provider if: Your fatigue does not get better. You have a fever. You suddenly lose or gain weight. You have headaches. You have trouble falling asleep or sleeping through the night. You feel angry, guilty, anxious, or sad. You have swelling in your legs or another part of your body. Get help right away if: You feel confused, feel like you might faint, or faint. Your vision is blurry or you have a severe headache. You have severe pain in your abdomen, your back, or the area between your waist and hips (pelvis). You have chest pain, shortness of breath, or an irregular or fast heartbeat. You are unable to urinate, or you urinate less than normal. You have abnormal bleeding from the rectum, nose, lungs, nipples, or, if you are female, the vagina. You vomit blood. You have thoughts about hurting yourself or others. These symptoms may be an emergency. Get help right away. Call 911. Do not wait to see if the symptoms will go away. Do not drive yourself to the hospital. Get help right away if you feel like you may hurt yourself or others, or have thoughts about taking your own life. Go to your nearest emergency room or: Call 911. Call the [facility] at [phone] or 988. This is open 24 hours a day. Text the Crisis Text Line at 979-823-0385. Summary If you have fatigue, you feel tired all the time and have a lack of energy or a lack of motivation. Fatigue may make it difficult to start or complete tasks because of exhaustion. Long-term (chronic) or extreme fatigue may be a symptom of a medical condition. Exercise regularly, as told by your health care provider.  Change situations that cause you stress. Try to keep your work and personal schedules in balance. This information is not intended to replace advice given to you by your health care provider. Make sure you discuss any questions you have with your health care provider. Document Revised: 08/30/2021 Document  Reviewed: 08/30/2021 Elsevier Patient Education  [place].

## 2022-06-03 LAB — THYROID PANEL WITH TSH
Free Thyroxine Index: 2.5 (ref 1.2–4.9)
T3 Uptake Ratio: 27 % (ref 24–39)
T4, Total: 9.4 ug/dL (ref 4.5–12.0)
TSH: 2.61 u[IU]/mL (ref 0.450–4.500)

## 2022-06-07 ENCOUNTER — Telehealth: Payer: Self-pay | Admitting: Neurology

## 2022-06-07 LAB — VITAMIN B12: Vitamin B-12: 687 pg/mL (ref 232–1245)

## 2022-06-07 LAB — MULTIPLE MYELOMA PANEL, SERUM
Albumin SerPl Elph-Mcnc: 4.1 g/dL (ref 2.9–4.4)
Albumin/Glob SerPl: 1.3 (ref 0.7–1.7)
Alpha 1: 0.2 g/dL (ref 0.0–0.4)
Alpha2 Glob SerPl Elph-Mcnc: 0.8 g/dL (ref 0.4–1.0)
B-Globulin SerPl Elph-Mcnc: 1 g/dL (ref 0.7–1.3)
Gamma Glob SerPl Elph-Mcnc: 1.2 g/dL (ref 0.4–1.8)
Globulin, Total: 3.2 g/dL (ref 2.2–3.9)
IgA/Immunoglobulin A, Serum: 66 mg/dL — ABNORMAL LOW (ref 87–352)
IgG (Immunoglobin G), Serum: 1043 mg/dL (ref 586–1602)
IgM (Immunoglobulin M), Srm: 193 mg/dL (ref 26–217)

## 2022-06-07 LAB — COMPREHENSIVE METABOLIC PANEL
ALT: 7 IU/L (ref 0–32)
AST: 12 IU/L (ref 0–40)
Albumin/Globulin Ratio: 2 (ref 1.2–2.2)
Albumin: 4.9 g/dL (ref 3.9–4.9)
Alkaline Phosphatase: 55 IU/L (ref 44–121)
BUN/Creatinine Ratio: 22 (ref 9–23)
BUN: 15 mg/dL (ref 6–24)
Bilirubin Total: 0.6 mg/dL (ref 0.0–1.2)
CO2: 22 mmol/L (ref 20–29)
Calcium: 9.7 mg/dL (ref 8.7–10.2)
Chloride: 102 mmol/L (ref 96–106)
Creatinine, Ser: 0.68 mg/dL (ref 0.57–1.00)
Globulin, Total: 2.4 g/dL (ref 1.5–4.5)
Glucose: 88 mg/dL (ref 70–99)
Potassium: 4.7 mmol/L (ref 3.5–5.2)
Sodium: 141 mmol/L (ref 134–144)
Total Protein: 7.3 g/dL (ref 6.0–8.5)
eGFR: 111 mL/min/{1.73_m2} (ref >59)

## 2022-06-07 LAB — SJOGREN'S SYNDROME ANTIBODS(SSA + SSB)
ENA SSA (RO) Ab: <0.2 AI (ref 0.0–0.9)
ENA SSB (LA) Ab: <0.2 AI (ref 0.0–0.9)

## 2022-06-07 LAB — CK TOTAL AND CKMB (NOT AT ARMC)
CK-MB Index: 1.3 ng/mL (ref 0.0–5.3)
Total CK: 50 U/L (ref 32–182)

## 2022-06-07 LAB — C-REACTIVE PROTEIN: CRP: <1 mg/L (ref 0–10)

## 2022-06-07 LAB — SEDIMENTATION RATE: Sed Rate: 2 mm/hr (ref 0–32)

## 2022-06-07 LAB — MAGNESIUM: Magnesium: 2.2 mg/dL (ref 1.6–2.3)

## 2022-06-07 LAB — ANA W/REFLEX: ANA Titer 1: NEGATIVE

## 2022-06-07 LAB — HEPATIC FUNCTION PANEL: Bilirubin, Direct: 0.13 mg/dL (ref 0.00–0.40)

## 2022-06-07 LAB — VITAMIN D 25 HYDROXY (VIT D DEFICIENCY, FRACTURES): Vit D, 25-Hydroxy: 51.5 ng/mL (ref 30.0–100.0)

## 2022-06-07 NOTE — Progress Notes (Signed)
The lower IgA is not significant- main message here is that no spike in any Ig is present.

## 2022-06-07 NOTE — Telephone Encounter (Signed)
(  I found this listed as interactions; "Using sodium oxybate together with valbenazine may increase side effects such as drowsiness, dizziness, lightheadedness, confusion, depression, low blood pressure, slow or shallow breathing, and impairment in thinking, judgment, and motor coordination."/ I have no experience with Ingrezza or  Vrylar, and can't think of alternatives- the alternative to Trudee Kuster is [name] / and the side effects are the same.   [name], Wakix-  these will be less respiratory suppressing-  I am not able to predict risk more exactly. Melvyn Novas, MD

## 2022-06-13 ENCOUNTER — Ambulatory Visit
Admission: RE | Admit: 2022-06-13 | Discharge: 2022-06-13 | Disposition: A | Discharge status: Home / Self Care | Payer: BC Managed Care – PPO | Source: Ambulatory Visit | Attending: Internal Medicine | Admitting: Internal Medicine

## 2022-06-13 ENCOUNTER — Other Ambulatory Visit: Payer: Self-pay | Admitting: Internal Medicine

## 2022-06-13 DIAGNOSIS — R1031 Right lower quadrant pain: Secondary | ICD-10-CM

## 2022-06-13 MED ORDER — IOPAMIDOL (ISOVUE-300) INJECTION 61%
100.0000 mL | Freq: Once | INTRAVENOUS | Status: AC | PRN
  Administered 2022-06-13: 100 mL via INTRAVENOUS

## 2022-07-01 ENCOUNTER — Telehealth: Payer: Self-pay

## 2022-07-01 ENCOUNTER — Other Ambulatory Visit: Payer: Self-pay | Admitting: Adult Health

## 2022-07-01 MED ORDER — METRONIDAZOLE 500 MG PO TABS: 500.0000 mg | ORAL_TABLET | Freq: Two times a day (BID) | ORAL | 0 refills | Status: DC

## 2022-07-01 NOTE — Telephone Encounter (Signed)
done

## 2022-08-01 ENCOUNTER — Ambulatory Visit (INDEPENDENT_AMBULATORY_CARE_PROVIDER_SITE_OTHER): Payer: Self-pay | Admitting: Neurology

## 2022-08-01 ENCOUNTER — Encounter: Payer: Self-pay | Admitting: Neurology

## 2022-08-01 VITALS — BP 138/88 | HR 90 | Ht 60.0 in | Wt 153.0 lb

## 2022-08-01 DIAGNOSIS — F319 Bipolar disorder, unspecified: Secondary | ICD-10-CM

## 2022-08-01 DIAGNOSIS — G4710 Hypersomnia, unspecified: Secondary | ICD-10-CM

## 2022-08-01 NOTE — Progress Notes (Signed)
SLEEP MEDICINE CLINIC   Provider:    , M D  Primary Care Physician:  [name], Lauren M, PA   Referring Provider: Clelland, Lauren M, PA   Chief Complaint  Patient presents with   Obstructive Sleep Apnea    Rm 10, alone - no reason for this visit- she didn't need this visit, will [place] 1, and give her co-pay back.     HPI:   Rv with MD on 06-02-2022: Lauren [name] has followed here compliantly as a patient with a chief concern of hypersomnia.  She is also treated for sleep apnea.  Her last 3 visits were with nurse practitioners here in our office and she had still voiced a concern of hypersomnia being present while her sleep apnea was treated.  This persistent hypersomnia is not longer a concern- see EDS- more the fatigue severity-  expressed as a severe fatigue. Tremors have much improved after psychiatrist changed her from abilify. She had been over a year on [name] when she developed tremors. Now on Spravato (?) , not entered into Epic.  SPRAVATO. LFTs have remained normal.  Fatigue is what is her chief concern , not sleepiness. FSS at 59/ 63 points- on Adderral and on xywav.  Epworth today on XyWAV 4.5 g bin at 3/ 24 points. Good results for apnea resolution on BiPAP.    Today's BiPAP shows 100% compliance for the last 30 days actually looking back the last 90 days and an average of 7-1/2 hours of BiPAP use each night.  The inspiratory pressure is set at 17 expiratory pressure at 10 and she has an ST rate so-called respiratory capture rate of 10/min so she is using a BiPAP S10 with a resolution of apnea to an AHI or apnea hypopnea index, of 0.5/h with minimal air leak.    So I cannot state that neither apnea nor idiopathic hypersomnia are at this time responsible for her fatigue and that there has to be another source.  Her apnea is optimally treated and her sleepiness has decreased on Xywav. The patient permittit me access to her [facility] through her smart phone ; dictated  06-02-2022:  The patient's basic [facility]  obtained yesterday 01 June 2022   showed normal fasting glucose BUN of 15 creatinine 0.77 mg/dL.  Glomerular filtration rate 98 which is excellent.  Sodium 139 mmol potassium 4.5 mmol chloride 104 mmol CO2 27 mmol anion gap 12.6 mmol calcium 9.9 mg/dL albumin 4.7 g/dL liver function tests were not included.  He has a second a TSH thyroid-stimulating hormone wasat 2.34 international unit/mL this is also in normal range.  A CBC with differential did not show elevated white blood cell counts, no evidence of anemia.  For patient with a chronic fatigue component I will add a rheumatological panel, CK, we will also look at T3 and T4 with uptake, there has been no history of neoplasm in the past the patient has never suffered from a cancer or leukemia.    Update 01/31/2022 JM: 41-year-old female who returns for initial BiPAP compliance visit and hyperinsomnia.  Continues on Xywav 4.5g at 8pm and 12am and [name] 30 mg daily. Main complaint is continued daytime sleepiness, she will try not take her [name] on the weekends as she is hopeful this will help have more effect during the week days but she has great difficulty with fatigue if she doesn't take on the weekends and has not noticed improvement of afternoon fatigue. At times   will wake up not feeling refreshed. She usually takes [name] between 5-6AM. 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 Fatigue severity scale 56/63 (prior 60 12/2020), Epworth Sleepiness Scale 0 (prior 5 10/2021).  Routinely followed by psychiatry who recently discontinued [name] due to tremors and tardive dyskinesias and started on Ingrezza and amantadine with symptoms gradually improving.  Continues to work with PCP correcting hypothyroidism, most recent TSH improved although still slightly elevated.    She received new BiPAP machine back in January with compliance report over the past 30 days  showing excellent compliance at 100% (total and >4hrs usage) and optimal residual AHI 0.2.  Leaks in the 95th percentile 6.9.  IPAP 17, EPAP 10. Prior AHI on CPAP at 12.6! Overall tolerating BiPAP well except more recently experiencing mask leak around bridge of her nose.   11/11/21 ALL:  Lauren [name] is a 41 y.o. female here today for follow up for idiopathic hypersomnia. She has been compliant on CPAP therapy but AHI not well managed. BiPAP has been ordered but she is waiting on DME to call with machine (currently backordered). She still has more fatigue in the evenings.    She continues Xywav 4.5g twice nightly. First dose when in bed at 8pm. Second dose usually around 12am. She usually wakes around 5am. She does not wake feeling refreshed. She is following closely with PCP. TSH was high and recent adjustments to synthroid were made. She has not received new dose from pharmacy.    Dr Toy Care continues to follow closely. She continues lithium. She feels mood is stable although she has felt a little more irritable that she normally does.     Rv 01-12-2021; with husband, here with persistent hypersomnia and extreme fatigue, no cardiac disease, no diabetes, but elevated BP.   Has dizzyspells.lightheaded but positive romberg, staring spells, weakness spells. No loss of awareness.   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  She is not functioning at work and feels overwhelmed, and she is afraid she can't find another job. Thi sounds more like depression.      RV 08-12-2020: I have the pleasure of meeting with Lauren [name] today 3 months after she underwent a new attempt at a CPAP directed baseline polysomnography with an MSLT to follow.  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  She endorsed the Epworth Sleepiness Scale between 16 and 14  points at various times, she had no apnea using CPAP in the nightly study REM latency was still 60 minutes to my surprise, the multiple sleep latency test showed sleep onset with an average of 8 minutes which is a central sleep latency than desired however we were again unable to find any REM latencies or REM sleep onset.  Clinically the patient presents with symptoms that are very reminiscent of narcolepsy and there is a possibility that this is an idiopathic hypersomnia that still can convert to early REM onset.  The patient had appropriately weaned off all REM suppressing medication prior to the test.  We discussed today to continue [name] which gave her a better daytime control of sleepiness without inducing insomnia at night.  She still feels that after lunch her concentration and wakefulness is decreasing.   And we discussed today to add in [name] form to help her through the day.  We also discussed that [name] and the highway for now have a new indication for idiopathic hypersomnia and excessive daytime sleepiness in patients such as [name] for but have not been able to be diagnosed with narcolepsy by the gold standard test of MSLT.  I also discussed the medication in detail about how to receive by mail, and that it has to be in a cool place away from food, but it has to be taken twice at night and that it induces a very deep sleep indeed sometimes with great difficulties to arouse the patient should that be any alarm or any situation coming up.  The patient lives with her spouse and teenage daughter and her 11-year-old daughter and I think that her home environment is conducive to try anxiety with if the need arises.  So today I will add 10 mg XR [name] in addition to [name] and I will let her tell me in 4 to 8 weeks if this is going well otherwise we will change her to [name] wave.     RV 04-01-2020 :  Lauren [name] is a 41 y.o. female , and seen here in a Rv  for a hypersomnia  evaluation- RN : pt with husband, rm 10. presents today as a follow up. DME [facility] -CPAP  is working well. She is still having concerns of daytime sleepiness. states that she will take the [name]  in the morning which it helps but still feels tired.  she will take the immediate release at lunch time and will again helps her stay alert until 7/7:30 pm but she feels the medication is allowing her to be able to function but she still feels tired.   The patient has been an excellent user of CPAP she is using an auto titration device between 5 and 12 cm of water pressure with 3 cm EPR and her 95th percentile pressure is 7.3 cm water.  Residual AHI is 0.2 which is an excellent resolution of any apnea.  She is 100% compliant for 30 out of 30 days of use and 97% compliance with 4 hours of use.  She had only 1 day where she did not use 4 hours consecutively.  The average user time is 8 hours and 3 minutes at night there are very minor air leaks overall this is very effective therapy but the past to my concern and the patient's not helped to make her less sleepy in daytime.  She remains on [name] with an Epworth Sleepiness Scale result of 12 points today. There has been no interval medical history-  She takes 20 mg [name] and 10 mg in PM.  She is a very light sleeper.  She feels that CPAP prevents her from waking up as frequently. She averages 8- 10 hours of sleep. She takes an iron supplement- her % saturation was only 10% in 10-2019.  Component 2 yr ago  DQA1*01:02 Positive   DQB1*06:02 Positive   Comment: Final Results:  DQA1*01:AXZVC,03:RV    MSLT may be next step now- she can wean off medication that depresses REM sleep.  We need to wean off [name], [name], [name],   In preparation for an MSLT during the summer break I would like for the patient to temporarily take [name] and extended release so that she can use it on a as needed basis and it would be sufficient for a valid MSLT to  discontinue the use 8 days prior to the sleep test.  As to   [name] the patient only uses 5 mg which is a health tablet at her currently prescribed dose of 10 mg tablets.  She can go to every other day 5 mg for 1 week and then should be able to stay off [name] for another 10 days prior to test.  It is very important that we schedule the MSLT so that these steps can be planned correctly in advance, I have to speak to Dr. Carr about the [name] and how to best be met and not as familiar with the medication and its half-life.  Since it is also a tablet it should be possible to cut the dose in half and go to every other day but I will confirm with Dr. Kerr that this is what she permits me to do.  My goal is to have the MSLT and early mid June.  So that we have time to wean during the school summer break.    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  Lauren [name] underwent a sleep study on 19 January 2018 at the time she was treated with Xanax, Pristiq, Latuda Pravachol and Synthroid.  She also had a BMI of 37 and an Epworth sleepiness scale was endorsed at 11 points.  She experienced temporary spells of weakness and daily hypersomnia.  Her AHI was 3.1/h  the REM AHI was 7.5 usually these levels of sleep apnea are not considered clinically significant.  However there was a quite loud primary snoring noted and the patient still had insomnia and hypersomnia concerns.  The patient decided to try out CPAP, and she has been compliant she has been using the machine 27 out of 30 days with a 76% compliance for hours average user time 5 hours 33 minutes AutoSet is between 5 and 12 cmH2O with 3 cm EPR and her AHI was reduced to 0.3 which is a further successful reduction of an already mild apnea it did not have any effect on her sleepiness.  A repeat laboratory test for review with lymphocytic antigen HLA DQ A1 and DQ B1 was obtained on 13 January by Amanda Cody and she to turn positive for both alleles alleles.  This  makes the likelihood of narcolepsy 85-87 %.   We discuss the process how to obtain a valid MSLT ;she ried to wean off latuda and failed, she couldn't work , not able to function. She is not sure she likes her CPAP and given the w baseline AHI I am not convinced it can change much. However, the diagnosis of OSA can help us to start her on modafinil.  She has been using [name] with Dr Kaurs order, she has seen Amanda Collier, PA.   I will order modafinil or Armodafinil. I will establish a weaning schedule for her REM supressant medications. If she cannot use CPAP 75-80% of the time compliantly , it is not worth to continue.    Dr. Willis patient ,  I had the pleasure of meeting with Lauren [name] before about 6-1/2 years ago she had undergone a sleep study at [facility].  She has been followed by Dr. Willis at the time who also ordered a sleep study.  In the meantime she has been followed by her primary care physician, and has seen Dr. Arfeen.  She has a diagnosis of hypothyroidism, fibromyalgia, excessive fatigue.  She reports that she had been under more stress at work and at home.  The patient is also a mother of   2 with her youngest son will be 8 years old.  After the birth of either she experienced temporary spells of weakness.  With each of the deliveries she had received magnesium, which can be the most likely cause for perinatal weakness.  Chief complaint according to patient : "depression, leaden fatigue, weight gain, and myalgia" I sometimes feel it coming down on me and I just need to sleep, have taken 2 naps while waiting here."  Sleep habits are as follows: The patient usually goes to bed around 8:30 PM, she takes Xanax at bedtime and is promptly asleep within 20-30 minutes.  The bedroom is cool, quiet and dark.  She prefers to sleep on her side or prone , and sleeps on one pillow.  I sleeps through the night, wakes up at 6 AM, on weekends she stays much longer in bed -until 10  AM. She estimates 9 hours of sleep on average. She wakes without headaches or dry mouth. She dreams a lot, has one or none bathroom break. Her husband noted her just to have started to snore, and suspected apnea.  She naps frequently in daytime. She works as a teacher in special education elementary school. Her naps are 3-5 minutes long.  Excessive daytime sleepiness and fatigue, 3-5 minute power naps, wakes up from her own snoring. Sleep paralysis in the past, none recent. Immediate dreams when napping. She cannot recall cataplectic spells.    Sleep medical history and family sleep history:  Excessive daytime sleepiness and fatigue, 3-5 minute power naps, wakes up from her own snoring. Mother is sleepy, sons are not.   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 Daughters  age 8  and 12. No smoking history, No ETOH, caffeine - two a day.   Review of Systems: Out of a complete 14 system review, the patient complains of only the following symptoms, and all other reviewed systems are negative.  snoring, weight gain, fatigued.     06-02-2022 Now on BIPAP and Xywav her ESS is endorsed at 3 points, form 15 points pre evaluation , 12 points on adderall, and 9 points on SONUSI,  Xywav now. Still   Fatigue severity score 60  , depression score n/a - [name] works for 3 hours only.  Dr Kaur changed IR from XR form , without effect.   How likely are you to doze in the following situations: 0 = not likely, 1 = slight chance, 2 = moderate chance, 3 = high chance  Sitting and Reading? Watching Television? Sitting inactive in a public place (theater or meeting)? Lying down in the afternoon when circumstances permit? Sitting and talking to someone? Sitting quietly after lunch without alcohol? In a car, while stopped for a few minutes in traffic? As a passenger in a car for an hour without a break?  3/ 24   FSS at 59 / 63 points.    Laboratory tests through [facility]: 0 Result Notes            Component Ref Range & Units 1 yr ago (05/15/21) 1 yr ago (01/27/21) 1 yr ago (12/31/20) 1 yr ago (10/29/20) 1 yr ago (10/07/20) 1 yr ago (08/17/20) 1 yr ago (07/01/20)  Sodium 135 - 145 mmol/L 139  141 R  139 R  138 R  138 R  137 R  137 R   Potassium 3.5 - 5.1 mmol/L 3.1 Low   4.0 R  4.0 R  4.2 R  4.2 R  4.2 R    4.5 R   Chloride 98 - 111 mmol/L 104  105 R  105 R  103 R  101 R  102 R  103 R   CO2 22 - 32 mmol/L 24  26 R  27 R  27 R  25 R  27 R  26 R   Glucose, Bld 70 - 99 mg/dL 86  106 High  R, CM  83 R, CM  95 R, CM  91 R, CM  168 High  R, CM  131 High  R, CM   Comment: Glucose reference range applies only to samples taken after fasting for at least 8 hours.  BUN 6 - 20 mg/dL 14  9 R  9 R  15 R  15 R  16 R  19 R   Creatinine, Ser 0.44 - 1.00 mg/dL 0.74  0.69 R  0.67 R  0.74 R  0.70 R  0.70 R  0.70 R   Calcium 8.9 - 10.3 mg/dL 9.0  9.2 R  9.5 R  9.5 R  9.7 R  9.3 R  9.9 R   GFR, Estimated >60 mL/min >60         Comment: (NOTE)  Calculated using the CKD-EPI Creatinine Equation (2021)   Anion gap 5 - 15 11         Comment: Performed at [facility], [place] [address] [place], [place] [address]  Resulting Agency  [facility] _[facility]         Specimen Collected: 05/15/21 13:39 Last Resulted: 05/15/21 14:31 08/01/2022 at 09:30 AM in Neurology Asencion Partridge Taylorann Tkach, MD)    0 Result Notes           Component Ref Range & Units 1 yr ago (05/15/21) 1 yr ago (01/27/21) 1 yr ago (12/31/20) 1 yr ago (10/29/20) 1 yr ago (10/07/20) 1 yr ago (08/17/20) 1 yr ago (07/01/20)  WBC 4.0 - 10.5 K/uL 9.9  8.7 R  8.4 R  10.7 R  10.4 R  11.2 High  R  11.0 High  R   RBC 3.87 - 5.11 MIL/uL 5.07  4.59 R  4.77 R  4.79 R  4.71 R  4.69 R  4.77 R   Hemoglobin 12.0 - 15.0 g/dL 14.6  13.7 R  14.3 R  14.2 R  13.8 R  13.7 R  13.8 R   HCT 36.0 - 46.0 % 43.8  40.7 R  42.1 R  42.4 R  41.1 R  41.3 R  41.8 R   MCV 80.0 - 100.0 fL 86.4  88.7  88.3  88.5  87.3  88.1  87.6   MCH 26.0 - 34.0  pg 28.8  29.8 R  30.0 R  29.6 R  29.3 R  29.2 R  28.9 R   MCHC 30.0 - 36.0 g/dL 33.3  33.7 R  34.0 R  33.5 R  33.6 R  33.2 R  33.0 R   RDW 11.5 - 15.5 % 12.2  12.0 R  12.4 R  12.6 R  12.9 R  12.6 R  13.2 R   Platelets 150 - 400 K/uL 186  184 R  168 R  193 R  168 R  171 R  180 R   nRBC 0.0 - 0.2 % 0.0         Comment: Performed at [facility], [place] [address] [place], [place] [address]  MPV   12.0 R  11.8 R  11.5 R  11.9 R  11.8 R  11.1 R   Eosinophils Relative   0.6 R  0.7 R  0.7 R  0.4 R  0.5 R  0.1 R   Basophils Relative   0.2 R  0.2 R  0.2 R  0.2 R  0.2 R  0.2 R   Neutro Abs   5,794 R  5,376 R  6,859 R  6,594 R  7,213 R  8,415 High  R   Lymphs Abs   2,288 R  2,293 R  3,071 R  3,026 R  3,304 R  2,068 R   Absolute Monocytes   548 R  655 R  674 R  718 R  605 R  484 R   Eosinophils Absolute   52 R  59 R  75 R  42 R  56 R  11 Low  R   Basophils Absolute   17 R  17 R  21 R  21 R  22 R  22 R   Neutrophils Relative %   66.6 R  64 R  64.1 R  63.4 R  64.4 R  76.5 R   Total Lymphocyte   26.3 R  27.3 R  28.7 R  29.1 R  29.5 R  18.8 R   Monocytes Relative   6.3 R  7.8 R  6.3 R  6.9 R  5.4 R  4.4 R   Resulting Agency  [facility]         Specimen Collected: 05/15/21 13:39 Last Resulted: 05/15/21 13:56      Lab Flowsheet    Order Details    View Encounter    Lab and Collection Details    Routing    Result History    View All Conversations on this Encounter      R=Reference range differs from displayed range      Result Care Coordination   Patient Communication   Add Comments   Seen Back to Top       Other Results from 05/15/2021  Troponin I (High Sensitivity) Order: [id number] Status: Final result    Visible to patient: Yes (seen)    Next appt: 08/01/2022 at 09:30 AM in Neurology ( , MD)    0 Result Notes      Component Ref Range & Units 1 yr ago (05/15/21) 1 yr ago (05/15/21)  Troponin I (High Sensitivity)  <18 ng/L 2  <2 CM   Comment: (NOTE)  Elevated high sensitivity troponin I (hsTnI) values and significant  changes across serial measurements may suggest ACS but many other  chronic and acute conditions are known to elevate hsTnI results.  Refer to the "Links" section for chest pain algorithms and additional  guidance.  Performed at [facility], [address]  [place], [place] [address]   Resulting Agency  [facility]                Social History   Socioeconomic History   Marital status: Married    Spouse name: Not on file   Number of children: 2   Years of education: Not on file   Highest education level: Not on file  Occupational History   Occupation: TEACHER    Employer: [employer]  Tobacco Use   Smoking status: Never   Smokeless tobacco: Never  Vaping Use   Vaping Use: Never used  Substance and   Sexual Activity   Alcohol use: Not Currently    Comment: occ   Drug use: No   Sexual activity: Yes    Partners: Male    Birth control/protection: Surgical    Comment: husband has had a vasectomy  Other Topics Concern   Not on file  Social History Narrative   4 caffeine drinks daily    Social Determinants of Health   Financial Resource Strain: Medium Risk (08/25/2021)   Overall Financial Resource Strain (CARDIA)    Difficulty of Paying Living Expenses: Somewhat hard  Food Insecurity: No Food Insecurity (08/25/2021)   Hunger Vital Sign    Worried About Running Out of Food in the Last Year: Never true    Ran Out of Food in the Last Year: Never true  Transportation Needs: No Transportation Needs (08/25/2021)   PRAPARE - Transportation    Lack of Transportation (Medical): No    Lack of Transportation (Non-Medical): No  Physical Activity: Insufficiently Active (08/25/2021)   Exercise Vital Sign    Days of Exercise per Week: 3 days    Minutes of Exercise per Session: 30 min  Stress: Stress Concern Present (08/25/2021)   [place]  [employer] of Occupational Health - Occupational Stress Questionnaire    Feeling of Stress : To some extent  Social Connections: Moderately Integrated (08/25/2021)   Social Connection and Isolation Panel [NHANES]    Frequency of Communication with Friends and Family: More than three times a week    Frequency of Social Gatherings with Friends and Family: Once a week    Attends Religious Services: More than 4 times per year    Active Member of Clubs or Organizations: No    Attends Club or Organization Meetings: Never    Marital Status: Married  Intimate Partner Violence: Not At Risk (08/25/2021)   Humiliation, Afraid, Rape, and Kick questionnaire    Fear of Current or Ex-Partner: No    Emotionally Abused: No    Physically Abused: No    Sexually Abused: No    Family History  Problem Relation Age of Onset   Hypothyroidism Mother    Cervical cancer Mother    Bipolar disorder Mother    Diabetes Mother    Colon polyps Mother    Bipolar disorder Brother    Kidney disease Brother    Alcohol abuse Brother    Hypothyroidism Daughter    Heart disease Maternal Grandmother    Diabetes Maternal Grandmother    Cancer Maternal Grandmother        SKIN   Hyperlipidemia Maternal Grandmother    Hypertension Maternal Grandmother    Hypothyroidism Maternal Grandmother    Lung cancer Maternal Grandfather    Heart disease Maternal Aunt    Diabetes Maternal Aunt    Diabetes Cousin    Colon cancer Neg Hx     Past Medical History:  Diagnosis Date   Anemia    Anxiety    Constipation    Depression    Fibromyalgia    GERD (gastroesophageal reflux disease)    Hemorrhoids    HSV-1 infection    Hypertension    Hypothyroidism    Idiopathic hypersomnia    OSA on CPAP    Other abnormal glucose    Preeclampsia     Past Surgical History:  Procedure Laterality Date   ANAL RECTAL MANOMETRY N/A 06/21/2021   Procedure: ANO RECTAL MANOMETRY;  Surgeon: White, Christopher M, MD;  Location: [facility]  ENDOSCOPY;  Service: General;  Laterality: N/A;   CARPAL   TUNNEL RELEASE Right 02/2020   CHOLECYSTECTOMY  2006   thumb surgery Bilateral    trigger thumb   TONSILLECTOMY     WISDOM TOOTH EXTRACTION      Current Outpatient Medications  Medication Sig Dispense Refill   amphetamine-dextroamphetamine ([name]) 20 MG tablet TAKE ONE TABLET BY MOUTH IN THE AFTERNOON     amphetamine-dextroamphetamine ([name]) 30 MG tablet Take 1 tablet by mouth daily.     Ca, Mg, K, and Na Oxybates (XYWAV) 500 MG/ML SOLN Take 4.5 G twice nightly 540 mL 5   Cholecalciferol (VITAMIN D3) 125 MCG (5000 UT) CAPS Take 1 capsule by mouth daily.     Deutetrabenazine (AUSTEDO) 6 MG TABS Take 6 mg by mouth in the morning and at bedtime.     Deutetrabenazine (AUSTEDO) 9 MG TABS Take 9 mg by mouth in the morning and at bedtime.     Iron-Vitamin C 65-125 MG TABS Take by mouth daily.     L-Methylfolate-Algae (DEPLIN 15 PO) Take by mouth.     latanoprost (XALATAN) 0.005 % ophthalmic solution 1 drop at bedtime.     levothyroxine (SYNTHROID) 125 MCG tablet Take 125 mcg by mouth every morning.     lithium carbonate (ESKALITH) 450 MG CR tablet Take 1 tablet by mouth at bedtime.     rosuvastatin (CRESTOR) 5 MG tablet Take  1 tablet  Daily  for Cholesterol (Patient taking differently: Takes 1 tab on Monday, Wednesday and Friday) 90 tablet 3   venlafaxine XR (EFFEXOR-XR) 75 MG 24 hr capsule Take 150 mg by mouth every evening.     vitamin E 100 UNIT capsule Take by mouth daily. Takes 111m     vortioxetine HBr ([name]) 5 MG TABS tablet Take 5 mg by mouth daily. Weaning off     No current facility-administered medications for this visit.    Allergies as of 08/01/2022 - Review Complete 06/02/2022  Allergen Reaction Noted   Lamictal [lamotrigine] Rash 02/11/2016   Doxycycline Other (See Comments) 09/19/2013   Effexor [venlafaxine] Other (See Comments) 09/19/2013   Savella [milnacipran hcl] Other (See Comments) 09/19/2013    Prilosec [omeprazole] Itching 11/21/2019    Vitals: BP 138/88   Pulse 90   Ht 5' (1.524 m)   Wt 153 lb (69.4 kg)   BMI 29.88 kg/m  Last Weight:  Wt Readings from Last 1 Encounters:  08/01/22 153 lb (69.4 kg)   BCBJ:SEGBmass index is 29.88 kg/m.     Last Height:   Ht Readings from Last 1 Encounters:  08/01/22 5' (1.524 m)    Physical exam:  General: The patient is awake, alert and appears in some distress. The patient is well groomed. Head: Normocephalic, atraumatic.  Neck is supple, ROM intact, no dizziness with these movements. . Mallampati 3 and pale mucosa. ,  neck circumference: 15,5 "  Nasal airflow congested , Retrognathia is not seen.  She wore braces and retainer, now bruxism mouth guard.  Cardiovascular:  Regular rate and rhythm, without  murmurs or carotid bruit, and without distended neck veins. Respiratory: Lungs are clear to auscultation. Skin:  Without evidence of edema, or rash Trunk: BMI is 37 .2 Neurologic exam : The patient is awake and alert, oriented to place and time.   Attention span & concentration ability appears normal.  Speech is slowed but  fluent, meek-  With some new dysphonia. She reports word-finding problems and mixing up words. Mood and affect are depressed and anxious.   Cranial nerves: Intact sense  of taste and smell.  Pupils are equal and briskly reactive to light. EOM intact, no nystagmus no restriction of visual field,/  Facial motor strength is symmetric and tongue and uvula move midline.  Shoulder shrug was symmetrical.  Motor exam:  Normal tone, muscle bulk and symmetric strength in all extremities.good grip strength.  Sensory: vibration intact.  Coordination: Rapid alternating movements in the fingers/hands were normal. Handwriting has changed- she is micrographic and has mild tremor.  Finger-to-nose maneuver  normal without evidence of ataxia, dysmetria or tremor. Slowed.  Gait and station: Patient walks without assistive device.   The patient is able to rise from her chair without assistance, she did use her arms to brace herself.  She was able to walk up normal speed by not performing any specific specific maneuver, her tandem gait was slower and she walks with outstretched arms to balance herself.  She truly had some difficulties with heel and toe walk more with heel walk.  Her Romberg was positive and she sway back and forth, but her eyes are closed she seem to have extremities to pro- pulse, to  fall forward.  Deep tendon reflexes: in the  upper and lower extremities are  Intact, 1 plus.  Babinski down going, tight achilles tendon noted.   Assessment:  After physical and neurologic examination, review of laboratory studies,  Personal review of imaging studies, reports of other /same  Imaging studies, results of polysomnography and / or neurophysiology testing and pre-existing records as far as provided in visit., my assessment is ;   1) UARS snoring, mild OSA -Mrs. Swafford has noted more sleep continuity on CPAP.  Her residal AHI  is excellent . Her sleepiness didn't change . Again she is highly compliant has used the CPAP at 90%, with an average daily use at time of 6 hours 28 minutes the settings are between 5 and 12 cmH2O with 3 cm EPR the residual AHI AHI is 0.1/h which speaks for an excellent resolution of apnea pressure at the 95th percentile is 7.7 cm water.  She sometimes has used the machine a little less on weekends than on weekdays.  Overall compliance is sufficient.  Epworth Sleepiness Scale remains at 16 points fatigue severity scale remains at 61 points.    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  Her last  MSLT had no REM sleep onsets in 5 naps.  Ideopathic Hypersomnia?  I suspect she can convert to narcolepsy any time. [name] has helped with managing the sleepiness but she is extremely fatigued. [name] did not help at 10  mg, and increased step by step to 50 mg a day. Dr Kaur has titrated.      2) spells of vertigo, dizziness, no falls. MRI brain was normal , c/s contrast agent . No incontinence , no LOC,  But neck pain and HTN . This are possible clues for a cervical spine involvement?   3) appears depressed. May explain the fatigue.  .     I spent more than 35  minutes of face to face time and preparation time with the patient.  Greater than 50% of time was spent in counseling and coordination of care. We have discussed the diagnosis and differential and I answered the patient's questions.    Plan:  Treatment plan and additional workup : [name] continued. BiPAP ST to be continued.  Ordered rheumatological panel.   As   discussed, [name]/ Xywav has to be taken with very mindful caution: Taking [name]/ Xywav  correctly is key. This means, take it only when you are fully ready to fall asleep, while in bed and refrain from doing any other activities, even brushing  your teeth after taking your first dose. The second dose will be about 2-1/2-4 hours after his first dose.  You can go to the bathroom before your 2nd dose. Take your first dose, when actually IN BED, ready to sleep.  No sitting up in bed, NO reading, NO using the cell phone or computer, NO getting up to use the bathroom. Take care of everything BEFORE sleep time. Try NOT to skip the second dose as the [name]/ Xywav is not going to stay in your system long enough with only one dose. Do not drink alcohol with [name]/ Xywav . If you do drink Alcohol, you cannot take your [name]/ Xywav doses that night.     , MD 08/01/2022, 9:51 AM  Certified in Neurology by ABPN Certified in Sleep Medicine by ABSM  [facility] [address], [address] [place], [place] [address]         

## 2022-09-09 ENCOUNTER — Other Ambulatory Visit (HOSPITAL_COMMUNITY): Payer: Self-pay | Admitting: Adult Health

## 2022-09-09 DIAGNOSIS — Z1231 Encounter for screening mammogram for malignant neoplasm of breast: Secondary | ICD-10-CM

## 2022-09-16 ENCOUNTER — Encounter: Payer: Self-pay | Admitting: Neurology

## 2022-09-20 ENCOUNTER — Ambulatory Visit
Admission: RE | Admit: 2022-09-20 | Discharge: 2022-09-20 | Disposition: A | Discharge status: Home / Self Care | Payer: BC Managed Care – PPO | Source: Ambulatory Visit | Attending: Physician Assistant | Admitting: Physician Assistant

## 2022-09-20 ENCOUNTER — Other Ambulatory Visit: Payer: Self-pay | Admitting: Physician Assistant

## 2022-09-20 DIAGNOSIS — S0990XD Unspecified injury of head, subsequent encounter: Secondary | ICD-10-CM

## 2022-09-21 ENCOUNTER — Telehealth: Payer: Self-pay | Admitting: Neurology

## 2022-09-21 ENCOUNTER — Ambulatory Visit: Payer: BC Managed Care – PPO | Admitting: Neurology

## 2022-09-21 NOTE — Telephone Encounter (Signed)
Yoom is calling from [employer]. Stated she need doctors approval to ship Lauren [name] because pt has reported been very depress. She requesting a call back from nurse

## 2022-09-21 NOTE — Telephone Encounter (Signed)
Called and spoke with Myriam Jacobson the pharmacist at [facility]. Provided the verbal ok to ship the medication.

## 2022-10-17 ENCOUNTER — Ambulatory Visit (HOSPITAL_COMMUNITY)
Admission: RE | Admit: 2022-10-17 | Discharge: 2022-10-17 | Disposition: A | Discharge status: Home / Self Care | Payer: BC Managed Care – PPO | Source: Ambulatory Visit | Attending: Adult Health | Admitting: Adult Health

## 2022-10-17 DIAGNOSIS — Z1231 Encounter for screening mammogram for malignant neoplasm of breast: Secondary | ICD-10-CM | POA: Insufficient documentation

## 2022-10-27 ENCOUNTER — Telehealth: Payer: Self-pay | Admitting: *Deleted

## 2022-10-27 NOTE — Telephone Encounter (Signed)
Faxed Xywav refill to [phone]. Received fax confirmation.

## 2022-11-01 NOTE — Telephone Encounter (Signed)
Faxed med list to [phone]. Received fax confirmation.

## 2022-11-01 NOTE — Telephone Encounter (Signed)
Joe is calling from [employer]. Stated he needs a med list faxed to [phone].

## 2022-12-05 NOTE — Progress Notes (Unsigned)
Primary neurologist: Dr. Brett Fairy Reason for visit: CPAP and hyperinsomnia f/u    No chief complaint on file.    HISTORY OF PRESENT ILLNESS:  Update 12/06/2022 JM: Patient returns for 88-month follow-up.        History provided for reference purposes only Update 06/02/2022 Dr. Brett Fairy: Lauren [name] has followed here compliantly as a patient with a chief concern of hypersomnia.  She is also treated for sleep apnea.  Her last 3 visits were with nurse practitioners here in our office and she had still voiced a concern of hypersomnia being present while her sleep apnea was treated.  This persistent hypersomnia is not longer a concern- see EDS- more the fatigue severity-  expressed as a severe fatigue. Tremors have much improved after psychiatrist changed her from [place]. She had been over a year on [name] when she developed tremors. Now on Spravato (?) , not entered into Epic.  SPRAVATO. LFTs have remained normal.  Fatigue is what is her chief concern , not sleepiness. FSS at 59/ 63 points- on Adderral and on xywav.  Epworth today on XyWAV 4.5 g bin at 3/ 24 points. Good results for apnea resolution on BiPAP.     Today's BiPAP shows 100% compliance for the last 30 days actually looking back the last 90 days and an average of 7-1/2 hours of BiPAP use each night.  The inspiratory pressure is set at 17 expiratory pressure at 10 and she has an ST rate so-called respiratory capture rate of 10/min so she is using a BiPAP S10 with a resolution of apnea to an AHI or apnea hypopnea index, of 0.5/h with minimal air leak.     So I cannot state that neither apnea nor idiopathic hypersomnia are at this time responsible for her fatigue and that there has to be another source.  Her apnea is optimally treated and her sleepiness has decreased on Xywav.  The patient permittit me access to her [facility] through her smart phone ; dictated 06-02-2022:  The patient's basic metabolic profile at [facility]   obtained yesterday 01 June 2022   showed normal fasting glucose BUN of 15 creatinine 0.77 mg/dL.  Glomerular filtration rate 98 which is excellent.  Sodium 139 mmol potassium 4.5 mmol chloride 104 mmol CO2 27 mmol anion gap 12.6 mmol calcium 9.9 mg/dL albumin 4.7 g/dL liver function tests were not included.  He has a second a TSH thyroid-stimulating hormone wasat 2.34 international unit/mL this is also in normal range.  A CBC with differential did not show elevated white blood cell counts, no evidence of anemia.   For patient with a chronic fatigue component I will add a rheumatological panel, CK, we will also look at T3 and T4 with uptake, there has been no history of neoplasm in the past the patient has never suffered from a cancer or leukemia.    Update 01/31/2022 JM: 42 year old female who returns for initial BiPAP compliance visit and hyperinsomnia.  Continues on Xywav 4.5g at 8pm and 12am and [name] 30 mg daily. Main complaint is continued daytime sleepiness, she will try not take her [name] on the weekends as she is hopeful this will help have more effect during the week days but she has great difficulty with fatigue if she doesn't take on the weekends and has not noticed improvement of afternoon fatigue. At times will wake up not feeling refreshed. She usually takes [name] between 5-6AM. 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 Fatigue severity scale 56/63 (prior 60 12/2020), Epworth Sleepiness Scale 0 (prior 5 10/2021).  Routinely followed by psychiatry who recently discontinued [name] due to tremors and tardive dyskinesias and started on Ingrezza and amantadine with symptoms gradually improving.  Continues to work with PCP correcting hypothyroidism, most recent TSH improved although still slightly elevated.    She received new BiPAP machine back in January with compliance report over the past 30 days showing excellent compliance at 100% (total and >4hrs usage) and  optimal residual AHI 0.2.  Leaks in the 95th percentile 6.9.  IPAP 17, EPAP 10. Prior AHI on CPAP at 12.6! Overall tolerating BiPAP well except more recently experiencing mask leak around bridge of her nose.   Update 11/11/2021 ALL:  Lauren [name] is a 42 y.o. female here today for follow up for idiopathic hypersomnia. She has been compliant on CPAP therapy but AHI not well managed. BiPAP has been ordered but she is waiting on DME to call with machine (currently backordered). She still has more fatigue in the evenings.   She continues Xywav 4.5g twice nightly. First dose when in bed at 8pm. Second dose usually around 12am. She usually wakes around 5am. She does not wake feeling refreshed. She is following closely with PCP. TSH was high and recent adjustments to synthroid were made. She has not received new dose from pharmacy.   Dr Evelene Croon continues to follow closely. She continues lithium. She feels mood is stable although she has felt a little more irritable that she normally does.   HISTORY (copied from Dr Dohmeier's previous note)  Rv 01-12-2021; with husband, here with persistent hypersomnia and extreme fatigue, no cardiac disease, no diabetes, but elevated BP.    Has dizzyspells.lightheaded but positive romberg, staring spells, weakness spells. No loss of awareness.    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  She is not functioning at work and feels overwhelmed, and she is afraid she can't find another job. Thi sounds more like depression.        REVIEW OF SYSTEMS: Out of a complete 14 system review of symptoms, the patient complains only of the following symptoms, fatigue, depression irritability and all other reviewed systems are negative.    ALLERGIES: Allergies  Allergen Reactions   Lamictal [Lamotrigine] Rash   Doxycycline Other (See Comments)    GI UPSET   Effexor [Venlafaxine] Other (See Comments)     DYSPHORIA   Savella [Milnacipran Hcl] Other (See Comments)    NO RELIEF   Prilosec [Omeprazole] Itching     HOME MEDICATIONS: Outpatient Medications Prior to Visit  Medication Sig Dispense Refill   amphetamine-dextroamphetamine ([name]) 20 MG tablet TAKE ONE TABLET BY MOUTH IN THE AFTERNOON     amphetamine-dextroamphetamine ([name]) 30 MG tablet Take 1 tablet by mouth daily.     Ca, Mg, K, and Na Oxybates (XYWAV) 500 MG/ML SOLN Take 4.5 G twice nightly 540 mL 5   Cholecalciferol (VITAMIN D3) 125 MCG (5000 UT) CAPS Take 1 capsule by mouth daily.     Deutetrabenazine (AUSTEDO) 6 MG TABS Take 6 mg by mouth in the morning and at bedtime.     Deutetrabenazine (AUSTEDO) 9 MG TABS Take 9 mg by mouth in the morning and at bedtime.     Iron-Vitamin C 65-125 MG TABS Take by mouth daily.     L-Methylfolate-Algae (DEPLIN 15 PO) Take by mouth.  latanoprost (XALATAN) 0.005 % ophthalmic solution 1 drop at bedtime.     levothyroxine (SYNTHROID) 125 MCG tablet Take 125 mcg by mouth every morning.     lithium carbonate (ESKALITH) 450 MG CR tablet Take 1 tablet by mouth at bedtime.     rosuvastatin (CRESTOR) 5 MG tablet Take  1 tablet  Daily  for Cholesterol (Patient taking differently: Takes 1 tab on Monday, Wednesday and Friday) 90 tablet 3   venlafaxine XR (EFFEXOR-XR) 75 MG 24 hr capsule Take 150 mg by mouth every evening.     vitamin E 100 UNIT capsule Take by mouth daily. Takes 180mg      vortioxetine HBr ([name]) 5 MG TABS tablet Take 5 mg by mouth daily. Weaning off     No facility-administered medications prior to visit.     PAST MEDICAL HISTORY: Past Medical History:  Diagnosis Date   Anemia    Anxiety    Constipation    Depression    Fibromyalgia    GERD (gastroesophageal reflux disease)    Hemorrhoids    HSV-1 infection    Hypertension    Hypothyroidism    Idiopathic hypersomnia    OSA on CPAP    Other abnormal glucose    Preeclampsia      PAST SURGICAL  HISTORY: Past Surgical History:  Procedure Laterality Date   ANAL RECTAL MANOMETRY N/A 06/21/2021   Procedure: ANO RECTAL MANOMETRY;  Surgeon: 08/21/2021, MD;  Location: [facility];  Service: General;  Laterality: N/A;   CARPAL TUNNEL RELEASE Right 02/2020   CHOLECYSTECTOMY  2006   thumb surgery Bilateral    trigger thumb   TONSILLECTOMY     WISDOM TOOTH EXTRACTION       FAMILY HISTORY: Family History  Problem Relation Age of Onset   Hypothyroidism Mother    Cervical cancer Mother    Bipolar disorder Mother    Diabetes Mother    Colon polyps Mother    Bipolar disorder Brother    Kidney disease Brother    Alcohol abuse Brother    Hypothyroidism Daughter    Heart disease Maternal Grandmother    Diabetes Maternal Grandmother    Cancer Maternal Grandmother        SKIN   Hyperlipidemia Maternal Grandmother    Hypertension Maternal Grandmother    Hypothyroidism Maternal Grandmother    Lung cancer Maternal Grandfather    Heart disease Maternal Aunt    Diabetes Maternal Aunt    Diabetes Cousin    Colon cancer Neg Hx      SOCIAL HISTORY: Social History   Socioeconomic History   Marital status: Married    Spouse name: Not on file   Number of children: 2   Years of education: Not on file   Highest education level: Not on file  Occupational History   Occupation: TEACHER    Employer: [employer]  Tobacco Use   Smoking status: Never   Smokeless tobacco: Never  Vaping Use   Vaping Use: Never used  Substance and Sexual Activity   Alcohol use: Not Currently    Comment: occ   Drug use: No   Sexual activity: Yes    Partners: Male    Birth control/protection: Surgical    Comment: husband has had a vasectomy  Other Topics Concern   Not on file  Social History Narrative   4 caffeine drinks daily    Social Determinants of Health   Financial Resource Strain: Medium Risk (08/25/2021)   Overall  Financial Resource Strain (CARDIA)    Difficulty of  Paying Living Expenses: Somewhat hard  Food Insecurity: No Food Insecurity (08/25/2021)   Hunger Vital Sign    Worried About Running Out of Food in the Last Year: Never true    Ran Out of Food in the Last Year: Never true  Transportation Needs: No Transportation Needs (08/25/2021)   PRAPARE - Administrator, Civil Service (Medical): No    Lack of Transportation (Non-Medical): No  Physical Activity: Insufficiently Active (08/25/2021)   Exercise Vital Sign    Days of Exercise per Week: 3 days    Minutes of Exercise per Session: 30 min  Stress: Stress Concern Present (08/25/2021)   [employer] of Occupational Health - Occupational Stress Questionnaire    Feeling of Stress : To some extent  Social Connections: Moderately Integrated (08/25/2021)   Social Connection and Isolation Panel [NHANES]    Frequency of Communication with Friends and Family: More than three times a week    Frequency of Social Gatherings with Friends and Family: Once a week    Attends Religious Services: More than 4 times per year    Active Member of [employer] or Organizations: No    Attends Banker Meetings: Never    Marital Status: Married  Catering manager Violence: Not At Risk (08/25/2021)   Humiliation, Afraid, Rape, and Kick questionnaire    Fear of Current or Ex-Partner: No    Emotionally Abused: No    Physically Abused: No    Sexually Abused: No     PHYSICAL EXAM  There were no vitals filed for this visit.   There is no height or weight on file to calculate BMI.  Generalized: Well developed, pleasant middle-age Caucasian female, in no acute distress  Cardiology: normal rate and rhythm, no murmur auscultated  Respiratory: clear to auscultation bilaterally    Neurological examination  Mentation: Alert oriented to time, place, history taking. Follows all commands speech and language fluent, flat affect and depressed mood Cranial nerve II-XII: Pupils were equal round reactive to  light. Extraocular movements were full, visual field were full on confrontational test. Facial sensation and strength were normal. Head turning and shoulder shrug  were normal and symmetric. Motor: The motor testing reveals 5 over 5 strength of all 4 extremities. Good symmetric motor tone is noted throughout.  Gait and station: Gait is normal.     DIAGNOSTIC DATA (LABS, IMAGING, TESTING) - I reviewed patient records, labs, notes, testing and imaging myself where available.  Lab Results  Component Value Date   WBC 8.8 11/11/2021   HGB 14.6 11/11/2021   HCT 43.7 11/11/2021   MCV 89 11/11/2021   PLT 171 11/11/2021      Component Value Date/Time   NA 141 06/02/2022 1208   K 4.7 06/02/2022 1208   CL 102 06/02/2022 1208   CO2 22 06/02/2022 1208   GLUCOSE 88 06/02/2022 1208   GLUCOSE 95 05/27/2021 1240   BUN 15 06/02/2022 1208   CREATININE 0.68 06/02/2022 1208   CREATININE 0.69 01/27/2021 1700   CALCIUM 9.7 06/02/2022 1208   PROT 7.3 06/02/2022 1208   ALBUMIN 4.9 06/02/2022 1208   AST 12 06/02/2022 1208   ALT 7 06/02/2022 1208   ALKPHOS 55 06/02/2022 1208   BILITOT 0.6 06/02/2022 1208   GFRNONAA >60 05/27/2021 1240   GFRNONAA 107 01/27/2021 1700   GFRAA 124 01/27/2021 1700   Lab Results  Component Value Date   CHOL 170 10/29/2020  HDL 58 10/29/2020   LDLCALC 88 10/29/2020   LDLDIRECT 114.9 04/30/2012   TRIG 139 10/29/2020   CHOLHDL 2.9 10/29/2020   Lab Results  Component Value Date   HGBA1C 5.9 (H) 01/27/2021   Lab Results  Component Value Date   QASTMHDQ22 297 06/02/2022   Lab Results  Component Value Date   TSH 2.610 06/02/2022         No data to display               No data to display           ASSESSMENT AND PLAN  42 y.o. year old female  has a past medical history of Anemia, Anxiety, Constipation, Depression, Fibromyalgia, GERD (gastroesophageal reflux disease), Hemorrhoids, HSV-1 infection, Hypertension, Hypothyroidism, Idiopathic  hypersomnia, OSA on CPAP, Other abnormal glucose, and Preeclampsia. here with     No diagnosis found.    Continue [name] 30 mg daily - did discuss trying to take around 7am to see if this helps with afternoon fatigue, if no benefit, will further discuss with Dr. Brett Fairy to possibly consider use of [name] IR in the afternoon.  Continue Xywav 4.5 g at 8pm and 12am which has helped significantly with hypersomnia. She is aware of appropriate administration and to keep this medication secured at all times.  Lab work completed at prior visit 3 months ago which was satisfactory.   She will continue BiPAP with excellent usage and optimal residual AHI.  Continue to follow with DME company for any needed supplies or CPAP related concerns. Hopefully as she continues to use BiPAP with now good treatment of apnea, this can further help with her fatigue. Advised to f/u with DME company regarding leaking around full face mask - may need padding/seal replacements She will continue close follow up with PCP and psychiatry.  Will follow up with Dr. Brett Fairy to see if there are any other recommendations for continued fatigue    No orders of the defined types were placed in this encounter.    No orders of the defined types were placed in this encounter.    I spent 37 minutes of face-to-face and non-face-to-face time with patient.  This included previsit chart review, lab review, study review, order entry, electronic health record documentation, patient education and discussion regarding review and discussion of BiPAP report and related concerns as above, persistent hypersomnia and review of current treatment plan and possible future treatment plan and answered all other questions to patient satisfaction  Frann Rider, [facility]  [facility] [address] [place], [place] [address]  Phone [phone] Fax [phone] Note: This document was prepared with digital  dictation and possible smart phrase technology. Any transcriptional errors that result from this process are unintentional.

## 2022-12-06 ENCOUNTER — Ambulatory Visit: Payer: BC Managed Care – PPO | Admitting: Adult Health

## 2022-12-06 VITALS — BP 127/74 | HR 91 | Ht 60.0 in | Wt 164.0 lb

## 2022-12-06 DIAGNOSIS — G4733 Obstructive sleep apnea (adult) (pediatric): Secondary | ICD-10-CM | POA: Diagnosis not present

## 2022-12-06 DIAGNOSIS — G4710 Hypersomnia, unspecified: Secondary | ICD-10-CM | POA: Diagnosis not present

## 2022-12-06 NOTE — Patient Instructions (Addendum)
Your Plan:  Continue Xywav and adderall at current dosages for now  Will further discuss other options with Dr. Brett Fairy regarding continued fatigue symptoms   Continue to use BiPAP nightly     Follow up with Dr. Brett Fairy in 6 months or call earlier if needed     Thank you for coming to see [place] at [facility]. I hope we have been able to provide you high quality care today.  You may receive a patient satisfaction survey over the next few weeks. We would appreciate your feedback and comments so that we may continue to improve ourselves and the health of our patients.

## 2022-12-21 ENCOUNTER — Encounter: Payer: Self-pay | Admitting: Physician Assistant

## 2022-12-21 ENCOUNTER — Ambulatory Visit (INDEPENDENT_AMBULATORY_CARE_PROVIDER_SITE_OTHER): Payer: BC Managed Care – PPO | Admitting: Physician Assistant

## 2022-12-21 VITALS — BP 144/87 | HR 84 | Ht 60.0 in | Wt 166.2 lb

## 2022-12-21 DIAGNOSIS — G2401 Drug induced subacute dyskinesia: Secondary | ICD-10-CM | POA: Diagnosis not present

## 2022-12-21 DIAGNOSIS — G4711 Idiopathic hypersomnia with long sleep time: Secondary | ICD-10-CM | POA: Diagnosis not present

## 2022-12-21 DIAGNOSIS — F329 Major depressive disorder, single episode, unspecified: Secondary | ICD-10-CM

## 2022-12-21 MED ORDER — AMPHETAMINE-DEXTROAMPHETAMINE 20 MG PO TABS: ORAL_TABLET | ORAL | 0 refills | Status: DC

## 2022-12-21 NOTE — Progress Notes (Addendum)
[facility] MD/PA/NP Initial Note  12/21/2022 5:41 PM Lauren [name]  MRN:  [id number]  Chief Complaint:  Chief Complaint   Establish Care     HPI:  Here to discuss depression.    She has been on a number of different medications over the years for depression.  Back on Effexor for about 3 months, on Lithium for several years.  She was on the Spravato for roughly 3 months last summer and states it made her feel worse.  States she cried all the time.  For the past couple of weeks her mood has been better but at the beginning of the school year this past fall she had a rough time.  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  Energy and motivation are low.  She does not enjoy much of anything, does not want to go places.  Has financial worries.  Appetite is normal and weight is stable.  Personal hygiene is normal.  Denies suicidal or homicidal thoughts.  She has idiopathic hypersomnolence.  Is using a BiPAP.  Also on xywav for that, as well as [name].  Since being on this treatment she has been much less tired and does not fall asleep sitting at the lunch table with her kids for example.  She does have anxiety at times.  No panic attacks.  More of feeling overwhelmed.  Has never had any manic symptoms.  Patient denies increased energy with decreased need for sleep, increased talkativeness, racing thoughts, impulsivity or risky behaviors, increased spending, increased libido, grandiosity, increased irritability or anger, paranoia, or hallucinations.  On Austedo for TD.  Prior to going on this she had abnormal mouth movements as well as clinching of her toes.  Her feet would hurt because of that.  She is much better on the Austedo.  Visit Diagnosis:    ICD-10-CM   1. Treatment-resistant depression  F32.9     2. 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 Tardive dyskinesia  G24.01      Past Psychiatric History:   Past medications for mental health diagnoses include: Spravato  for about 3 months (made her worse, not better)  took it 1-2 times a week.  Effexor, Lithium, [name], Xanax, elavil, and 'everything that's out there.' She can't name the meds.  Pristiq,  Prozac, lamictal caused rash, Latuda, Lithium, [name],Trazodone, Austedo, Ingrezza didn't help.   Past Medical History:  Past Medical History:  Diagnosis Date   Anemia    Anxiety    Constipation    Depression    Fibromyalgia    GERD (gastroesophageal reflux disease)    Hemorrhoids    HSV-1 infection    Hypertension    Hypothyroidism    Idiopathic hypersomnia    OSA on CPAP    Other abnormal glucose    Preeclampsia     Past Surgical History:  Procedure Laterality Date   ANAL RECTAL MANOMETRY N/A 06/21/2021   Procedure: ANO RECTAL MANOMETRY;  Surgeon: Ileana Roup, MD;  Location: [facility];  Service: General;  Laterality: N/A;   CARPAL TUNNEL RELEASE Right 02/2020   CHOLECYSTECTOMY  2006   thumb surgery Bilateral    trigger thumb   TONSILLECTOMY     WISDOM TOOTH EXTRACTION      Family Psychiatric History:  See below  Family History:  Family History  Problem Relation Age of Onset   Depression Mother    Hypothyroidism Mother    Cervical cancer Mother    Bipolar disorder  Mother    Diabetes Mother    Colon polyps Mother    Bipolar disorder Brother    Kidney disease Brother    Alcohol abuse Brother    Heart disease Maternal Aunt    Diabetes Maternal Aunt    Lung cancer Maternal Grandfather    Heart disease Maternal Grandmother    Diabetes Maternal Grandmother    Cancer Maternal Grandmother        SKIN   Hyperlipidemia Maternal Grandmother    Hypertension Maternal Grandmother    Hypothyroidism Maternal Grandmother    Diabetes Cousin    Hypothyroidism Daughter    Colon cancer Neg Hx     Social History:  Social History   Socioeconomic History   Marital status: Married    Spouse name: Not on file   Number of children: 2   Years of education: Not on file    Highest education level: Not on file  Occupational History   Occupation: TEACHER    Employer: [employer]  Tobacco Use   Smoking status: Never   Smokeless tobacco: Never  Vaping Use   Vaping Use: Never used  Substance and Sexual Activity   Alcohol use: Not Currently    Comment: occ   Drug use: No   Sexual activity: Yes    Partners: Male    Birth control/protection: Surgical    Comment: husband has had a vasectomy  Other Topics Concern   Not on file  Social History Narrative   Parents divorced when she was 1. Never abused. Parents didn't get along.    Teacher, special ed for 17 years.    Married, 2 daughters still live at home.       Caffeine-0-1   Religion-[employer]ist not going to church   Legal none   Social Determinants of Health   Financial Resource Strain: Low Risk  (12/21/2022)   Overall Financial Resource Strain (CARDIA)    Difficulty of Paying Living Expenses: Not very hard  Food Insecurity: No Food Insecurity (12/21/2022)   Hunger Vital Sign    Worried About Running Out of Food in the Last Year: Never true    Ran Out of Food in the Last Year: Never true  Transportation Needs: No Transportation Needs (08/25/2021)   PRAPARE - Hydrologist (Medical): No    Lack of Transportation (Non-Medical): No  Physical Activity: Inactive (12/21/2022)   Exercise Vital Sign    Days of Exercise per Week: 0 days    Minutes of Exercise per Session: 0 min  Stress: Stress Concern Present (12/21/2022)   [place]    Feeling of Stress : Rather much  Social Connections: Moderately Integrated (08/25/2021)   Social Connection and Isolation Panel [NHANES]    Frequency of Communication with Friends and Family: More than three times a week    Frequency of Social Gatherings with Friends and Family: Once a week    Attends Religious Services: More than 4 times per year    Active Member of  [employer] or Organizations: No    Attends Archivist Meetings: Never    Marital Status: Married    Allergies:  Allergies  Allergen Reactions   Lamictal [Lamotrigine] Rash   Doxycycline Other (See Comments)    GI UPSET   Effexor [Venlafaxine] Other (See Comments)    DYSPHORIA   Savella [Milnacipran Hcl] Other (See Comments)    NO RELIEF   Prilosec [Omeprazole] Itching  Metabolic Disorder Labs: Lab Results  Component Value Date   HGBA1C 5.9 (H) 01/27/2021   MPG 123 01/27/2021   MPG 126 10/29/2020   No results found for: "PROLACTIN" Lab Results  Component Value Date   CHOL 170 10/29/2020   TRIG 139 10/29/2020   HDL 58 10/29/2020   CHOLHDL 2.9 10/29/2020   VLDL 34 (H) 03/08/2017   LDLCALC 88 10/29/2020   LDLCALC 68 04/15/2020   Lab Results  Component Value Date   TSH 2.610 06/02/2022   TSH 0.01 (L) 04/12/2021    Therapeutic Level Labs: Lab Results  Component Value Date   LITHIUM 0.4 (L) 01/06/2021   LITHIUM 0.3 (L) 12/31/2020   No results found for: "VALPROATE" No results found for: "CBMZ"  Current Medications: Current Outpatient Medications  Medication Sig Dispense Refill   amphetamine-dextroamphetamine ([name]) 30 MG tablet Take 1 tablet by mouth daily.     AUSTEDO XR 24 MG TB24 Take 48 mg by mouth daily.     Ca, Mg, K, and Na Oxybates (XYWAV) 500 MG/ML SOLN Take 4.5 G twice nightly 540 mL 5   Cholecalciferol (VITAMIN D3) 125 MCG (5000 UT) CAPS Take 1 capsule by mouth daily.     Iron-Vitamin C 65-125 MG TABS Take by mouth daily.     L-Methylfolate-Algae (DEPLIN 15 PO) Take by mouth.     latanoprost (XALATAN) 0.005 % ophthalmic solution 1 drop at bedtime.     levothyroxine (SYNTHROID) 125 MCG tablet Take 125 mcg by mouth every morning.     lithium carbonate (ESKALITH) 450 MG CR tablet Take 1 tablet by mouth at bedtime.     rosuvastatin (CRESTOR) 5 MG tablet Take  1 tablet  Daily  for Cholesterol (Patient taking differently: Takes 1 tab on Monday,  Wednesday and Friday) 90 tablet 3   UNABLE TO FIND BiPAP     venlafaxine XR (EFFEXOR-XR) 75 MG 24 hr capsule Take 225 mg by mouth every evening.     vitamin E 100 UNIT capsule Take by mouth daily. Takes 180mg      amphetamine-dextroamphetamine ([name]) 20 MG tablet TAKE ONE TABLET BY MOUTH IN THE AFTERNOON 30 tablet 0   No current facility-administered medications for this visit.   Medication Side Effects: none  Orders placed this visit:  No orders of the defined types were placed in this encounter.  Psychiatric Specialty Exam:  Review of Systems  Constitutional: Negative.   HENT: Negative.    Eyes: Negative.   Respiratory: Negative.    Cardiovascular: Negative.   Gastrointestinal: Negative.   Endocrine: Negative.   Genitourinary: Negative.   Musculoskeletal: Negative.   Skin: Negative.   Allergic/Immunologic: Negative.   Neurological: Negative.   Hematological: Negative.   Psychiatric/Behavioral:         See HPI    Blood pressure (!) 144/87, pulse 84, height 5' (1.524 m), weight 166 lb 3.2 oz (75.4 kg).Body mass index is 32.46 kg/m.  General Appearance: Casual and Well Groomed  Eye Contact:  Good  Speech:  Clear and Coherent and Normal Rate  Volume:  Normal  Mood:  Depressed and Hopeless  Affect:  Depressed and Tearful  Thought Process:  Goal Directed and Descriptions of Associations: Circumstantial  Orientation:  Full (Time, Place, and Person)  Thought Content: Logical   Suicidal Thoughts:  No  Homicidal Thoughts:  No  Memory:  WNL  Judgement:  Good  Insight:  Good  Psychomotor Activity:  Normal and without abnormal mouth, truncal, or extremity movements  Concentration:  Concentration: Good  Recall:  Good  Fund of Knowledge: Good  Language: Good  Assets:  Desire for Improvement Financial Resources/Insurance Housing Transportation Vocational/Educational  ADL's:  Intact  Cognition: WNL  Prognosis:  Good   Screenings:  GAD-7    Flowsheet Row Office  Visit from 12/21/2022 in [facility] Office Visit from 08/25/2021 in [facility] for [place] at [facility]  Total GAD-7 Score [address] [place] Office Visit from 12/21/2022 in [place] Office Visit from 08/25/2021 in [facility] for [place] [facility] [place] Visit from 03/05/2019 in [facility] for [place] at [place] from 12/31/2018 in [place] Visit from 12/28/2017 in [facility]  PHQ-2 Total Score 1 0 2 0 3  PHQ-9 Total Score -- 2 6 -- [place] [facility] from 05/15/2021 in [facility] at [place] No Risk      Receiving Psychotherapy: Yes   Treatment Plan/Recommendations:  PDMP reviewed.  [name] filled 11/29/2022 and 11/11/2022, different doses. I provided 65 minutes of face to face time during this encounter, including time spent before and after the visit in records review, medical decision making, counseling pertinent to today's visit, and charting.   We had a long discussion about the depression including past treatments.  She has never had an Gene site testing done so I recommended that.  She has been on Deplin but has never been tested for the MTHFR genetic abnormality.  The Deplin is expensive and if it is not needed we can stop it.  I recommend that we make no changes until we get the results back.  She understands and agrees.  Continue [name] 30 mg, 1 p.o. every morning. Continue [name] 20 mg, 1 p.o. q. afternoon. Continue Austedo XR 24 mg, 2 p.o. daily. Continue XYWAV nightly. Continue Deplin 15 mg, 1 p.o. daily. Continue lithium 450 mg nightly.  Donnal Moat, PA-C

## 2023-01-03 ENCOUNTER — Other Ambulatory Visit: Payer: Self-pay | Admitting: Neurology

## 2023-01-03 MED ORDER — XYWAV 500 MG/ML PO SOLN: ORAL | 5 refills | Status: DC

## 2023-01-16 ENCOUNTER — Other Ambulatory Visit: Payer: Self-pay

## 2023-01-16 ENCOUNTER — Telehealth: Payer: Self-pay | Admitting: Physician Assistant

## 2023-01-16 ENCOUNTER — Ambulatory Visit: Payer: BC Managed Care – PPO | Admitting: Physician Assistant

## 2023-01-16 MED ORDER — AMPHETAMINE-DEXTROAMPHETAMINE 30 MG PO TABS: 1.0000 | ORAL_TABLET | Freq: Every day | ORAL | 0 refills | Status: DC

## 2023-01-16 NOTE — Telephone Encounter (Signed)
Pended.

## 2023-01-16 NOTE — Telephone Encounter (Signed)
Lauren [name] had to cancel her appt today.  Pt will need RF of [name] '30mg'$ ,  Send [name] here: To [facility] in [place] Also- Lithium carbonate '450mg'$  1 HS RF also needed ( she has not gotten lithium level recently)   She is also not sure about Deplin b/c she did a Gene Test and not sure she should continue? [facility] [address] [facility] [place]

## 2023-01-19 ENCOUNTER — Telehealth: Payer: Self-pay | Admitting: Physician Assistant

## 2023-01-19 MED ORDER — AUSTEDO XR 24 MG PO TB24: 48.0000 mg | ORAL_TABLET | Freq: Every day | ORAL | 1 refills | Status: DC

## 2023-01-19 NOTE — Telephone Encounter (Signed)
Pt called and would like her austedo xr 24 mg  2 a day needs to be sent to [facility]. Address is [address] [place] , [place]

## 2023-01-19 NOTE — Telephone Encounter (Signed)
Sent!

## 2023-01-23 ENCOUNTER — Ambulatory Visit: Payer: BC Managed Care – PPO | Admitting: Podiatry

## 2023-01-23 DIAGNOSIS — L600 Ingrowing nail: Secondary | ICD-10-CM | POA: Diagnosis not present

## 2023-01-23 MED ORDER — CEPHALEXIN 500 MG PO CAPS: 500.0000 mg | ORAL_CAPSULE | Freq: Three times a day (TID) | ORAL | 0 refills | Status: DC

## 2023-01-23 NOTE — Patient Instructions (Signed)
Pre-Operative Instructions  Congratulations, you have decided to take an important step to improving your quality of life.  You can be assured that the doctors of [facility] will be with you every step of the way.  Plan to be at the surgery center/hospital at least 1 (one) hour prior to your scheduled time unless otherwise directed by the surgical center/hospital staff.  You must have a responsible adult accompany you, remain during the surgery and drive you home.  Make sure you have directions to the surgical center/hospital and know how to get there on time. For hospital based surgery you will need to obtain a history and physical form from your family physician within 1 month prior to the date of surgery- we will give you a form for you primary physician.  We make every effort to accommodate the date you request for surgery.  There are however, times where surgery dates or times have to be moved.  We will contact you as soon as possible if a change in schedule is required.   No Aspirin/Ibuprofen for one week before surgery.  If you are on aspirin, any non-steroidal anti-inflammatory medications (Mobic, Aleve, Ibuprofen) you should stop taking it 7 days prior to your surgery.  You make take Tylenol  For pain prior to surgery.  Medications- If you are taking daily heart and blood pressure medications, seizure, reflux, allergy, asthma, anxiety, pain or diabetes medications, make sure the surgery center/hospital is aware before the day of surgery so they may notify you which medications to take or avoid the day of surgery. No food or drink after midnight the night before surgery unless directed otherwise by surgical center/hospital staff. No alcoholic beverages 24 hours prior to surgery.  No smoking 24 hours prior to or 24 hours after surgery. Wear loose pants or shorts- loose enough to fit over bandages, boots, and casts. No slip on shoes, sneakers are best. Bring your boot with you to the  surgery center/hospital.  Also bring crutches or a walker if your physician has prescribed it for you.  If you do not have this equipment, it will be provided for you after surgery. If you have not been contracted by the surgery center/hospital by the day before your surgery, call to confirm the date and time of your surgery. Leave-time from work may vary depending on the type of surgery you have.  Appropriate arrangements should be made prior to surgery with your employer. Prescriptions will be provided immediately following surgery by your doctor.  Have these filled as soon as possible after surgery and take the medication as directed. Remove nail polish on the operative foot. Wash the night before surgery.  The night before surgery wash the foot and leg well with the antibacterial soap provided and water paying special attention to beneath the toenails and in between the toes.  Rinse thoroughly with water and dry well with a towel.  Perform this wash unless told not to do so by your physician.  Enclosed: 1 Ice pack (please put in freezer the night before surgery)   1 Hibiclens skin cleaner   Pre-op Instructions  If you have any questions regarding the instructions, do not hesitate to call our office at any point during this process.   [place]: [address] [place], [place] [address] [phone]  [place]: [address] [place], [place] [address] [phone]  Dr. Greyden Besecker, DPM  

## 2023-01-23 NOTE — Progress Notes (Signed)
Subjective:   Patient ID: Lauren [name], female   DOB: 42 y.o.   MRN: [id number]   HPI Chief Complaint  Patient presents with   Ingrown Toenail    Right foot     42 year old female presents the office for above concerns.  Her husband tries to trim it. No drainage or pus. It has been ongoing on for a while.  Currently no drainage or pus.  No other concerns.    Review of Systems  All other systems reviewed and are negative.  Past Medical History:  Diagnosis Date   Anemia    Anxiety    Constipation    Depression    Fibromyalgia    GERD (gastroesophageal reflux disease)    Hemorrhoids    HSV-1 infection    Hypertension    Hypothyroidism    Idiopathic hypersomnia    OSA on CPAP    Other abnormal glucose    Preeclampsia     Past Surgical History:  Procedure Laterality Date   ANAL RECTAL MANOMETRY N/A 06/21/2021   Procedure: ANO RECTAL MANOMETRY;  Surgeon: Ileana Roup, MD;  Location: [facility];  Service: General;  Laterality: N/A;   CARPAL TUNNEL RELEASE Right 02/2020   CHOLECYSTECTOMY  2006   thumb surgery Bilateral    trigger thumb   TONSILLECTOMY     WISDOM TOOTH EXTRACTION       Current Outpatient Medications:    amphetamine-dextroamphetamine ([name]) 30 MG tablet, Take 1 tablet by mouth daily., Disp: 30 tablet, Rfl: 0   AUSTEDO XR 24 MG TB24, Take 48 mg by mouth daily., Disp: 180 tablet, Rfl: 1   Ca, Mg, K, and Na Oxybates (XYWAV) 500 MG/ML SOLN, Take 4.5 G twice nightly, Disp: 540 mL, Rfl: 5   cephALEXin (KEFLEX) 500 MG capsule, Take 1 capsule (500 mg total) by mouth 3 (three) times daily., Disp: 21 capsule, Rfl: 0   Cholecalciferol (VITAMIN D3) 125 MCG (5000 UT) CAPS, Take 1 capsule by mouth daily., Disp: , Rfl:    Iron-Vitamin C 65-125 MG TABS, Take by mouth daily., Disp: , Rfl:    L-Methylfolate-Algae (DEPLIN 15 PO), Take by mouth., Disp: , Rfl:    latanoprost (XALATAN) 0.005 % ophthalmic solution, 1 drop at bedtime., Disp: , Rfl:     levothyroxine (SYNTHROID) 125 MCG tablet, Take 125 mcg by mouth every morning., Disp: , Rfl:    lithium carbonate (ESKALITH) 450 MG CR tablet, Take 1 tablet by mouth at bedtime., Disp: , Rfl:    rosuvastatin (CRESTOR) 5 MG tablet, Take  1 tablet  Daily  for Cholesterol (Patient taking differently: Takes 1 tab on Monday, Wednesday and Friday), Disp: 90 tablet, Rfl: 3   UNABLE TO FIND, BiPAP, Disp: , Rfl:    venlafaxine XR (EFFEXOR-XR) 75 MG 24 hr capsule, Take 225 mg by mouth every evening., Disp: , Rfl:    vitamin E 100 UNIT capsule, Take by mouth daily. Takes 180mg , Disp: , Rfl:    amphetamine-dextroamphetamine ([name]) 20 MG tablet, TAKE ONE TABLET BY MOUTH IN THE AFTERNOON, Disp: 30 tablet, Rfl: 0  Allergies  Allergen Reactions   Lamictal [Lamotrigine] Rash   Doxycycline Other (See Comments)    GI UPSET   Effexor [Venlafaxine] Other (See Comments)    DYSPHORIA   Savella [Milnacipran Hcl] Other (See Comments)    NO RELIEF   Prilosec [Omeprazole] Itching          Objective:  Physical Exam  General: AAO x3, NAD  Dermatological: Incurvation  of the lateral right hallux toenail with localized edema and faint erythema with more from inflammation as opposed to infection.  There is no drainage or pus.  There is no open lesions noted today.  Vascular: Dorsalis Pedis artery and Posterior Tibial artery pedal pulses are 2/4 bilateral with immedate capillary fill time. There is no pain with calf compression, swelling, warmth, erythema.   Neruologic: Grossly intact via light touch bilateral.  Musculoskeletal: Tenderness to ingrown toenail.  No other areas of discomfort.  Gait: Unassisted, Nonantalgic.       Assessment:   Right lateral hallux ingrown toenail    Plan:  -Treatment options discussed including all alternatives, risks, and complications -Etiology of symptoms were discussed -At this time, the patient is requesting partial nail removal with chemical matricectomy to the  symptomatic portion of the nail. Risks and complications were discussed with the patient for which they understand and written consent was obtained. Under sterile conditions a total of 3 mL of a mixture of 2% lidocaine plain and 0.5% Marcaine plain was infiltrated in a hallux block fashion. Once anesthetized, the skin was prepped in sterile fashion. A tourniquet was then applied. Next the lateral aspect of hallux nail border was then sharply excised making sure to remove the entire offending nail border. Once the nails were ensured to be removed area was debrided and the underlying skin was intact. There is no purulence identified in the procedure. Next phenol was then applied under standard conditions and copiously irrigated. Silvadene was applied. A dry sterile dressing was applied. After application of the dressing the tourniquet was removed and there is found to be an immediate capillary refill time to the digit. The patient tolerated the procedure well any complications. Post procedure instructions were discussed the patient for which he verbally understood. Discussed signs/symptoms of infection and directed to call the office immediately should any occur or go directly to the emergency room. In the meantime, encouraged to call the office with any questions, concerns, changes symptoms. -Keflex  Trula Slade DPM

## 2023-01-24 ENCOUNTER — Telehealth: Payer: Self-pay | Admitting: Physician Assistant

## 2023-01-24 ENCOUNTER — Other Ambulatory Visit: Payer: Self-pay

## 2023-01-24 NOTE — Telephone Encounter (Signed)
Pt called and needs a refill on her adderall 20 mg  sent to [facility]

## 2023-01-24 NOTE — Telephone Encounter (Signed)
Pended.

## 2023-01-25 ENCOUNTER — Telehealth: Payer: Self-pay | Admitting: Neurology

## 2023-01-25 MED ORDER — AMPHETAMINE-DEXTROAMPHETAMINE 20 MG PO TABS: ORAL_TABLET | ORAL | 0 refills | Status: DC

## 2023-01-25 NOTE — Telephone Encounter (Signed)
PA completed on [employer] [id number] Will await determination

## 2023-01-25 NOTE — Telephone Encounter (Signed)
Pt called and needs her lithium carbonate 450 mg [facility] [address]

## 2023-01-26 NOTE — Telephone Encounter (Signed)
Received questions from [facility] on a form. Completed and faxed for the pt.  Approved 01/26/23-01/26/24

## 2023-02-01 ENCOUNTER — Encounter: Payer: Self-pay | Admitting: Physician Assistant

## 2023-02-01 ENCOUNTER — Ambulatory Visit (INDEPENDENT_AMBULATORY_CARE_PROVIDER_SITE_OTHER): Payer: BC Managed Care – PPO | Admitting: Physician Assistant

## 2023-02-01 DIAGNOSIS — G2401 Drug induced subacute dyskinesia: Secondary | ICD-10-CM | POA: Diagnosis not present

## 2023-02-01 DIAGNOSIS — F329 Major depressive disorder, single episode, unspecified: Secondary | ICD-10-CM | POA: Diagnosis not present

## 2023-02-01 DIAGNOSIS — Z1589 Genetic susceptibility to other disease: Secondary | ICD-10-CM | POA: Diagnosis not present

## 2023-02-01 DIAGNOSIS — G4711 Idiopathic hypersomnia with long sleep time: Secondary | ICD-10-CM | POA: Diagnosis not present

## 2023-02-01 DIAGNOSIS — F900 Attention-deficit hyperactivity disorder, predominantly inattentive type: Secondary | ICD-10-CM

## 2023-02-01 MED ORDER — AMPHETAMINE-DEXTROAMPHETAMINE 30 MG PO TABS: 1.0000 | ORAL_TABLET | Freq: Every day | ORAL | 0 refills | Status: DC

## 2023-02-01 MED ORDER — VENLAFAXINE HCL ER 75 MG PO CP24: 225.0000 mg | ORAL_CAPSULE | Freq: Every evening | ORAL | 1 refills | Status: DC

## 2023-02-01 MED ORDER — AMPHETAMINE-DEXTROAMPHETAMINE 20 MG PO TABS: ORAL_TABLET | ORAL | 0 refills | Status: DC

## 2023-02-01 NOTE — Progress Notes (Signed)
[facility]  Patient ID: Lauren [name],  MRN: [id number]  PCP: [name], PA  Date of Evaluation: 02/01/2023 Time spent:30 minutes  Chief Complaint:  Chief Complaint   Follow-up    HISTORY/CURRENT STATUS: HPI For routine f/u  Has been off Lithium for about a week. States Rx wasn't sent to pharmacy. No withdrawals although a little frustrated and anxious with the whole situation.   We were not sent the entire results of GeneSight test, only MTHFR which showed moderate reduction of conversion, see on chart.   Anhedonia, does only what has to be done. 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 Work is stressful but the same.  Energy and motivation are fair.  No extreme sadness, tearfulness, or feelings of hopelessness.  Sleeps well.  ADLs and personal hygiene are normal.  Appetite has not changed.  Weight is stable. Does get anxious at times, usually triggered though. No PA.  Denies suicidal or homicidal thoughts.  States that attention is good without easy distractibility.  Able to focus on things and finish tasks to completion.   Patient denies increased energy with decreased need for sleep, increased talkativeness, racing thoughts, impulsivity or risky behaviors, increased spending, increased libido, grandiosity, increased irritability or anger, paranoia, or hallucinations.  Review of Systems  Constitutional:  Positive for malaise/fatigue.  HENT: Negative.    Eyes: Negative.   Respiratory: Negative.    Cardiovascular: Negative.   Gastrointestinal: Negative.   Genitourinary: Negative.   Musculoskeletal: Negative.   Skin: Negative.   Neurological: Negative.   Endo/Heme/Allergies: Negative.   Psychiatric/Behavioral:         See HPI    Individual Medical History/ Review of Systems: Changes? :No   Past medications for mental health diagnoses include: Spravato for about 3 months (made her worse, not better)  took it 1-2 times a week.  Effexor, Lithium,  [name], Xanax, elavil, and 'everything that's out there.' She can't name the meds.  Pristiq,  Prozac, lamictal caused rash, Latuda, Lithium, [name], [name],Trazodone, Austedo, Ingrezza didn't help.   Allergies: Lamictal [lamotrigine], Doxycycline, Effexor [venlafaxine], Savella [milnacipran hcl], and Prilosec [omeprazole]  Current Medications:  Current Outpatient Medications:    AUSTEDO XR 24 MG TB24, Take 48 mg by mouth daily., Disp: 180 tablet, Rfl: 1   Ca, Mg, K, and Na Oxybates (XYWAV) 500 MG/ML SOLN, Take 4.5 G twice nightly, Disp: 540 mL, Rfl: 5   cephALEXin (KEFLEX) 500 MG capsule, Take 1 capsule (500 mg total) by mouth 3 (three) times daily., Disp: 21 capsule, Rfl: 0   Cholecalciferol (VITAMIN D3) 125 MCG (5000 UT) CAPS, Take 1 capsule by mouth daily., Disp: , Rfl:    Iron-Vitamin C 65-125 MG TABS, Take by mouth daily., Disp: , Rfl:    L-Methylfolate-Algae (DEPLIN 15 PO), Take by mouth., Disp: , Rfl:    latanoprost (XALATAN) 0.005 % ophthalmic solution, 1 drop at bedtime., Disp: , Rfl:    levothyroxine (SYNTHROID) 125 MCG tablet, Take 125 mcg by mouth every morning., Disp: , Rfl:    rosuvastatin (CRESTOR) 5 MG tablet, Take  1 tablet  Daily  for Cholesterol (Patient taking differently: Takes 1 tab on Monday, Wednesday and Friday), Disp: 90 tablet, Rfl: 3   UNABLE TO FIND, BiPAP, Disp: , Rfl:    vitamin E 100 UNIT capsule, Take by mouth daily. Takes 180mg , Disp: , Rfl:    [START ON 02/24/2023] amphetamine-dextroamphetamine ([name]) 20 MG tablet, TAKE ONE TABLET BY MOUTH IN THE AFTERNOON, Disp: 30 tablet, Rfl:  0   [START ON 02/24/2023] amphetamine-dextroamphetamine ([name]) 30 MG tablet, Take 1 tablet by mouth daily., Disp: 30 tablet, Rfl: 0   venlafaxine XR (EFFEXOR-XR) 75 MG 24 hr capsule, Take 3 capsules (225 mg total) by mouth every evening., Disp: 270 capsule, Rfl: 1 Medication Side Effects: none  Family Medical/ Social History: Changes? No  MENTAL HEALTH EXAM:  There were  no vitals taken for this visit.There is no height or weight on file to calculate BMI.  General Appearance: Casual and Well Groomed  Eye Contact:  Good  Speech:  Clear and Coherent and Normal Rate  Volume:  Normal  Mood:  Euthymic  Affect:  Congruent  Thought Process:  Goal Directed and Descriptions of Associations: Circumstantial  Orientation:  Full (Time, Place, and Person)  Thought Content: Logical   Suicidal Thoughts:  No  Homicidal Thoughts:  No  Memory:  WNL  Judgement:  Good  Insight:  Good  Psychomotor Activity:  Normal  Concentration:  Concentration: Fair  Recall:  Good  Fund of Knowledge: Good  Language: Good  Assets:  Desire for Improvement Financial Resources/Insurance Housing  ADL's:  Intact  Cognition: WNL  Prognosis:  Good   DIAGNOSES:    ICD-10-CM   1. Treatment-resistant depression  F32.9     2. Tardive dyskinesia  G24.01     3. 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 Attention deficit hyperactivity disorder (ADHD), predominantly inattentive type  F90.0      Receiving Psychotherapy: Yes   RECOMMENDATIONS:  PDMP reviewed.  [name] filled 01/25/2023. I provided 30 minutes of face to face time during this encounter, including time spent before and after the visit in records review, medical decision making, counseling pertinent to today's visit, and charting.   Discussed the Lithium. For now, stay off since she's already been off for over a week. She'll sign records release so I can get GeneSight results from previous provider, but we'll also call the company. Those results should come directly to me, even though they were done several years ago, since I ordered the full panel, not knowing they'd already been done. I will SEND HER A MSG THROUGH [employer] about genesight results.  When she runs out of deplin start folic acid [id number] mg d/t cost.   Continue [name] 30 mg po q am and 20 mg in the afternoon. Continue Austedo XR 24  mg qd. Continue Effexor XR 75 mg, 3 po qd.  Continue therapy. Return in 4 weeks.   Donnal Moat, PA-C

## 2023-02-06 ENCOUNTER — Ambulatory Visit (INDEPENDENT_AMBULATORY_CARE_PROVIDER_SITE_OTHER): Payer: BC Managed Care – PPO

## 2023-02-06 VITALS — BP 138/78 | HR 95

## 2023-02-06 DIAGNOSIS — L600 Ingrowing nail: Secondary | ICD-10-CM

## 2023-02-06 NOTE — Progress Notes (Signed)
Patient presents today for nail check of the right hallux - lateral border. Ingrown toenail procedure was performed on 01/23/23 by Dr. Jacqualyn [name].  Patient states it seems to be doing okay, I haven't had any problems. Advised patient she can discontinue soaking the toe in epsom salt at this time and leave the toe open to air. Patient verbalized understanding.   Well healing toe s/p matrixectomy. There is no drainage or signs of infection.   Patient will follow up as needed.

## 2023-02-09 ENCOUNTER — Ambulatory Visit: Payer: BC Managed Care – PPO | Admitting: Physician Assistant

## 2023-02-16 ENCOUNTER — Encounter: Payer: Self-pay | Admitting: Physician Assistant

## 2023-02-16 ENCOUNTER — Encounter: Payer: Self-pay | Admitting: Psychiatry

## 2023-03-03 ENCOUNTER — Encounter: Payer: Self-pay | Admitting: Physician Assistant

## 2023-03-03 ENCOUNTER — Ambulatory Visit (INDEPENDENT_AMBULATORY_CARE_PROVIDER_SITE_OTHER): Payer: BC Managed Care – PPO | Admitting: Physician Assistant

## 2023-03-03 DIAGNOSIS — G2401 Drug induced subacute dyskinesia: Secondary | ICD-10-CM

## 2023-03-03 DIAGNOSIS — F900 Attention-deficit hyperactivity disorder, predominantly inattentive type: Secondary | ICD-10-CM

## 2023-03-03 DIAGNOSIS — G4711 Idiopathic hypersomnia with long sleep time: Secondary | ICD-10-CM | POA: Diagnosis not present

## 2023-03-03 DIAGNOSIS — F329 Major depressive disorder, single episode, unspecified: Secondary | ICD-10-CM | POA: Diagnosis not present

## 2023-03-03 DIAGNOSIS — Z1589 Genetic susceptibility to other disease: Secondary | ICD-10-CM

## 2023-03-03 MED ORDER — AMPHETAMINE-DEXTROAMPHETAMINE 30 MG PO TABS: 1.0000 | ORAL_TABLET | Freq: Two times a day (BID) | ORAL | 0 refills | Status: DC

## 2023-03-03 NOTE — Progress Notes (Unsigned)
[facility]  Patient ID: Lauren [name],  MRN: [id number]  PCP: Delma Officer, PA  Date of Evaluation: 03/03/2023 Time spent:30 minutes  Chief Complaint:    HISTORY/CURRENT STATUS: HPI For routine f/u    Individual Medical History/ Review of Systems: Changes? :No   Past medications for mental health diagnoses include: Spravato for about 3 months (made her worse, not better)  took it 1-2 times a week.  Effexor, Lithium, [name], Xanax, elavil, and 'everything that's out there.' She can't name the meds.  Pristiq,  Prozac, lamictal caused rash, Latuda, Lithium, [name], [name],Trazodone, Austedo, Ingrezza didn't help.   Allergies: Lamictal [lamotrigine], Doxycycline, Effexor [venlafaxine], Savella [milnacipran hcl], and Prilosec [omeprazole]  Current Medications:  Current Outpatient Medications:    amphetamine-dextroamphetamine ([name]) 20 MG tablet, TAKE ONE TABLET BY MOUTH IN THE AFTERNOON, Disp: 30 tablet, Rfl: 0   AUSTEDO XR 24 MG TB24, Take 48 mg by mouth daily., Disp: 180 tablet, Rfl: 1   Ca, Mg, K, and Na Oxybates (XYWAV) 500 MG/ML SOLN, Take 4.5 G twice nightly, Disp: 540 mL, Rfl: 5   cephALEXin (KEFLEX) 500 MG capsule, Take 1 capsule (500 mg total) by mouth 3 (three) times daily., Disp: 21 capsule, Rfl: 0   Cholecalciferol (VITAMIN D3) 125 MCG (5000 UT) CAPS, Take 1 capsule by mouth daily., Disp: , Rfl:    Iron-Vitamin C 65-125 MG TABS, Take by mouth daily., Disp: , Rfl:    L-Methylfolate-Algae (DEPLIN 15 PO), Take by mouth., Disp: , Rfl:    latanoprost (XALATAN) 0.005 % ophthalmic solution, 1 drop at bedtime., Disp: , Rfl:    levothyroxine (SYNTHROID) 125 MCG tablet, Take 125 mcg by mouth every morning., Disp: , Rfl:    rosuvastatin (CRESTOR) 5 MG tablet, Take  1 tablet  Daily  for Cholesterol (Patient taking differently: Takes 1 tab on Monday, Wednesday and Friday), Disp: 90 tablet, Rfl: 3   UNABLE TO FIND, BiPAP, Disp: , Rfl:     venlafaxine XR (EFFEXOR-XR) 75 MG 24 hr capsule, Take 3 capsules (225 mg total) by mouth every evening., Disp: 270 capsule, Rfl: 1   vitamin E 100 UNIT capsule, Take by mouth daily. Takes 180mg , Disp: , Rfl:    [START ON 03/15/2023] amphetamine-dextroamphetamine ([name]) 30 MG tablet, Take 1 tablet by mouth 2 (two) times daily with breakfast and lunch., Disp: 60 tablet, Rfl: 0 Medication Side Effects: none  Family Medical/ Social History: Changes? No  MENTAL HEALTH EXAM:  There were no vitals taken for this visit.There is no height or weight on file to calculate BMI.  General Appearance: Casual and Well Groomed  Eye Contact:  Good  Speech:  Clear and Coherent and Normal Rate  Volume:  Normal  Mood:  Euthymic  Affect:  Congruent  Thought Process:  Goal Directed and Descriptions of Associations: Circumstantial  Orientation:  Full (Time, Place, and Person)  Thought Content: Logical   Suicidal Thoughts:  No  Homicidal Thoughts:  No  Memory:  WNL  Judgement:  Good  Insight:  Good  Psychomotor Activity:  Normal  Concentration:  Concentration: Fair  Recall:  Good  Fund of Knowledge: Good  Language: Good  Assets:  Desire for Improvement Financial Resources/Insurance Housing  ADL's:  Intact  Cognition: WNL  Prognosis:  Good   DIAGNOSES:  No diagnosis found.  Receiving Psychotherapy: Yes   RECOMMENDATIONS:  PDMP reviewed.  [name] filled 02/24/2023.  When she runs out of deplin start folic acid 400 mg d/t cost.   D/c  deplin Call if more irritable or worse and will send in Li 150 mg  Increasing [name]  Continue Austedo XR 24 mg qd.  Decrease Effexor XR 75 mg, to 2 po qd.  Continue therapy. Return in 4 weeks.   Melony Overly, PA-C

## 2023-03-05 MED ORDER — VENLAFAXINE HCL ER 75 MG PO CP24: 150.0000 mg | ORAL_CAPSULE | Freq: Every evening | ORAL | 1 refills | Status: DC

## 2023-04-06 ENCOUNTER — Encounter: Payer: Self-pay | Admitting: Adult Health

## 2023-04-06 ENCOUNTER — Ambulatory Visit: Payer: BC Managed Care – PPO | Admitting: Adult Health

## 2023-04-06 VITALS — BP 131/84 | HR 87 | Ht 60.0 in | Wt 161.0 lb

## 2023-04-06 DIAGNOSIS — Z0000 Encounter for general adult medical examination without abnormal findings: Secondary | ICD-10-CM

## 2023-04-06 NOTE — Progress Notes (Signed)
  Subjective:     Patient ID: Lauren [name], female   DOB: 12/31/1980, 42 y.o.   MRN: [id number]  HPI Lauren [name] is a 42 year old white female, married, G2P2 in complaining of feeling know in right labial area, felt Sunday and maybe a month ago too, but not sure.      Component Value Date/Time   DIAGPAP  08/25/2021 0848    - Negative for intraepithelial lesion or malignancy (NILM)   DIAGPAP  12/28/2017 0000    NEGATIVE FOR INTRAEPITHELIAL LESIONS OR MALIGNANCY.   HPVHIGH Negative 08/25/2021 0848   ADEQPAP  08/25/2021 0848    Satisfactory for evaluation; transformation zone component PRESENT.   ADEQPAP  12/28/2017 0000    Satisfactory for evaluation  endocervical/transformation zone component PRESENT.   PCP is [employer] PA.  Review of Systems Felt knot in labial area Last period was heavy, this is normal Reviewed past medical,surgical, social and family history. Reviewed medications and allergies.     Objective:   Physical Exam BP 131/84 (BP Location: Left Arm, Patient Position: Sitting, Cuff Size: Normal)   Pulse 87   Ht 5' (1.524 m)   Wt 161 lb (73 kg)   LMP 04/04/2023   BMI 31.44 kg/m  Skin warm and dry.Pelvic: external genitalia is normal in appearance no lesions or knots, no lymph nodes in groin felt, vagina: +period blood,urethra has no lesions or masses noted, cervix:smooth and bulbous, uterus: normal size, shape and contour, non tender, no masses felt, adnexa: no masses or tenderness noted. Bladder is non tender and no masses felt.   Fall risk is moderate  Upstream - 04/06/23 1526       Pregnancy Intention Screening   Does the patient want to become pregnant in the next year? N/A    Does the patient's partner want to become pregnant in the next year? N/A    Would the patient like to discuss contraceptive options today? N/A      Contraception Wrap Up   Current Method Vasectomy    End Method Vasectomy    Contraception Counseling Provided No             Examination chaperoned by Malachy Mood LPN  Assessment:     1. Normal genital exam   No knot felt today  Plan:     Follow up prn

## 2023-04-21 ENCOUNTER — Ambulatory Visit (INDEPENDENT_AMBULATORY_CARE_PROVIDER_SITE_OTHER): Payer: BC Managed Care – PPO | Admitting: Physician Assistant

## 2023-04-21 ENCOUNTER — Encounter: Payer: Self-pay | Admitting: Physician Assistant

## 2023-04-21 DIAGNOSIS — G4711 Idiopathic hypersomnia with long sleep time: Secondary | ICD-10-CM | POA: Diagnosis not present

## 2023-04-21 DIAGNOSIS — F900 Attention-deficit hyperactivity disorder, predominantly inattentive type: Secondary | ICD-10-CM

## 2023-04-21 DIAGNOSIS — F329 Major depressive disorder, single episode, unspecified: Secondary | ICD-10-CM

## 2023-04-21 DIAGNOSIS — G2401 Drug induced subacute dyskinesia: Secondary | ICD-10-CM | POA: Diagnosis not present

## 2023-04-21 DIAGNOSIS — Z1589 Genetic susceptibility to other disease: Secondary | ICD-10-CM

## 2023-04-21 MED ORDER — VENLAFAXINE HCL ER 37.5 MG PO CP24: 37.5000 mg | ORAL_CAPSULE | Freq: Every day | ORAL | 0 refills | Status: DC

## 2023-04-21 MED ORDER — AMPHETAMINE-DEXTROAMPHETAMINE 30 MG PO TABS: 30.0000 mg | ORAL_TABLET | Freq: Two times a day (BID) | ORAL | 0 refills | Status: DC

## 2023-04-21 NOTE — Progress Notes (Signed)
[facility]  Patient ID: Lauren [name],  MRN: [id number]  PCP: Delma Officer, PA  Date of Evaluation: 04/21/2023 Time spent:20 minutes  Chief Complaint:  Chief Complaint   Follow-up    HISTORY/CURRENT STATUS: HPI For routine f/u  We decreased Effexor at the last visit.  She had a rough 2 weeks with the decrease but after that she has tolerated it well.  She would like to continue to wean.  She is not sure she really ever was depressed, with the diagnosis of idiopathic hypersomnolence it is hard to know.  The [name] has been very helpful with energy, helping her stay awake, and focus and attention.  She feels a lot better since decreasing the Effexor.  States she almost felt emotionless.  She is still very tired, this is the last week of school.  Patient is able to enjoy things.  Energy and motivation are fair to good.  Work is ok.   No extreme sadness, tearfulness, or feelings of hopelessness.  Sleeps well most of the time. ADLs and personal hygiene are normal. Appetite has not changed.  Weight is stable. Not anxious at least most of the time.  Denies suicidal or homicidal thoughts.  Patient denies increased energy with decreased need for sleep, increased talkativeness, racing thoughts, impulsivity or risky behaviors, increased spending, increased libido, grandiosity, increased irritability or anger, paranoia, or hallucinations.  Denies dizziness, syncope, seizures, numbness, tingling, tremor, unsteady gait, slurred speech, confusion.  She has been on Austedo for involuntary movements, states has not really helping when she is stressed.  Individual Medical History/ Review of Systems: Changes? :No   Past medications for mental health diagnoses include: Spravato for about 3 months (made her worse, not better)  took it 1-2 times a week.  Effexor, Lithium, [name], Xanax, elavil, and 'everything that's out there.' She can't name the meds.  Pristiq,  Prozac, lamictal  caused rash, Latuda, Lithium, [name], [name],Trazodone, Austedo, Ingrezza didn't help.   Allergies: Lamictal [lamotrigine], Doxycycline, Effexor [venlafaxine], Savella [milnacipran hcl], and Prilosec [omeprazole]  Current Medications:  Current Outpatient Medications:    AUSTEDO XR 24 MG TB24, Take 48 mg by mouth daily., Disp: 180 tablet, Rfl: 1   Ca, Mg, K, and Na Oxybates (XYWAV) 500 MG/ML SOLN, Take 4.5 G twice nightly, Disp: 540 mL, Rfl: 5   Cholecalciferol (VITAMIN D3) 125 MCG (5000 UT) CAPS, Take 1 capsule by mouth daily., Disp: , Rfl:    FOLIC ACID PO, Take by mouth., Disp: , Rfl:    Iron-Vitamin C 65-125 MG TABS, Take by mouth daily., Disp: , Rfl:    latanoprost (XALATAN) 0.005 % ophthalmic solution, 1 drop at bedtime., Disp: , Rfl:    levothyroxine (SYNTHROID) 125 MCG tablet, Take 125 mcg by mouth every morning., Disp: , Rfl:    rosuvastatin (CRESTOR) 5 MG tablet, Take  1 tablet  Daily  for Cholesterol (Patient taking differently: Takes 1 tab on Monday, Wednesday and Friday), Disp: 90 tablet, Rfl: 3   UNABLE TO FIND, BiPAP, Disp: , Rfl:    venlafaxine XR (EFFEXOR-XR) 37.5 MG 24 hr capsule, Take 1 capsule (37.5 mg total) by mouth daily with breakfast. With 75 mg daily., Disp: 30 capsule, Rfl: 0   venlafaxine XR (EFFEXOR-XR) 75 MG 24 hr capsule, Take 2 capsules (150 mg total) by mouth every evening., Disp: 270 capsule, Rfl: 1   vitamin E 100 UNIT capsule, Take by mouth daily. Takes 180mg , Disp: , Rfl:    [START ON 04/26/2023] amphetamine-dextroamphetamine (  [name]) 30 MG tablet, Take 1 tablet by mouth 2 (two) times daily with breakfast and lunch., Disp: 60 tablet, Rfl: 0 Medication Side Effects: none  Family Medical/ Social History: Changes? No  MENTAL HEALTH EXAM:  Last menstrual period 04/04/2023.There is no height or weight on file to calculate BMI.  General Appearance: Casual and Well Groomed  Eye Contact:  Good  Speech:  Clear and Coherent and Normal Rate  Volume:  Normal   Mood:  Euthymic  Affect:  Congruent  Thought Process:  Goal Directed and Descriptions of Associations: Circumstantial  Orientation:  Full (Time, Place, and Person)  Thought Content: Logical   Suicidal Thoughts:  No  Homicidal Thoughts:  No  Memory:  WNL  Judgement:  Good  Insight:  Good  Psychomotor Activity:  Normal  Concentration:  Concentration: Good and Attention Span: Fair  Recall:  Good  Fund of Knowledge: Good  Language: Good  Assets:  Desire for Improvement Financial Resources/Insurance Housing Transportation Vocational/Educational  ADL's:  Intact  Cognition: WNL  Prognosis:  Good   See GeneSight results  DIAGNOSES:    ICD-10-CM   1. Treatment-resistant depression  F32.9     2. Tardive dyskinesia  G24.01     3. Attention deficit hyperactivity disorder (ADHD), predominantly inattentive type  F90.0     4. 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  [name] filled 03/27/2023. Xywav filled 04/03/2023. I provided 20 minutes of face to face time during this encounter, including time spent before and after the visit in records review, medical decision making, counseling pertinent to today's visit, and charting.   She sees neurology for the sleep disorder and I have recommended she talk with her about the abnormal movements.  She is not on any medication right now that is likely to cause TD, I would like for neurology to evaluate her.  Of course TD does not always resolve after the offending medication has been stopped.  We decided to decrease the Effexor XR a little further.  If she gets angry and more irritable then I may have to wean even slower.  She understands and will let me know.   Continue [name] 30 mg to bid. Continue Austedo XR 24 mg, 2 qd.  (She will take 1 in the morning and 1 at lunch to see if that will spread out the effectiveness of the drug.) Decrease  Effexor XR to 75 mg + 37.5 mg daily. Continue Bipap machine. Continue therapy. Return in 4 weeks.   Melony Overly, PA-C

## 2023-04-23 ENCOUNTER — Encounter: Payer: Self-pay | Admitting: Neurology

## 2023-04-24 LAB — LAB REPORT - SCANNED
A1c: 5.2
EGFR: 111

## 2023-05-19 ENCOUNTER — Ambulatory Visit: Payer: BC Managed Care – PPO | Admitting: Physician Assistant

## 2023-05-19 ENCOUNTER — Telehealth: Payer: Self-pay | Admitting: Physician Assistant

## 2023-05-19 ENCOUNTER — Encounter: Payer: Self-pay | Admitting: Physician Assistant

## 2023-05-19 DIAGNOSIS — Z1589 Genetic susceptibility to other disease: Secondary | ICD-10-CM | POA: Diagnosis not present

## 2023-05-19 DIAGNOSIS — F900 Attention-deficit hyperactivity disorder, predominantly inattentive type: Secondary | ICD-10-CM

## 2023-05-19 DIAGNOSIS — G2401 Drug induced subacute dyskinesia: Secondary | ICD-10-CM | POA: Diagnosis not present

## 2023-05-19 DIAGNOSIS — F329 Major depressive disorder, single episode, unspecified: Secondary | ICD-10-CM

## 2023-05-19 DIAGNOSIS — G4711 Idiopathic hypersomnia with long sleep time: Secondary | ICD-10-CM

## 2023-05-19 MED ORDER — VENLAFAXINE HCL ER 37.5 MG PO CP24: 37.5000 mg | ORAL_CAPSULE | Freq: Every day | ORAL | 0 refills | Status: DC

## 2023-05-19 MED ORDER — AMPHETAMINE-DEXTROAMPHETAMINE 20 MG PO TABS: 20.0000 mg | ORAL_TABLET | Freq: Two times a day (BID) | ORAL | 0 refills | Status: DC

## 2023-05-19 NOTE — Progress Notes (Signed)
[facility]  Patient ID: Lauren [name],  MRN: [id number]  PCP: Delma Officer, PA  Date of Evaluation: 05/19/2023 Time spent:20 minutes  Chief Complaint:  Chief Complaint   Anxiety; Follow-up    HISTORY/CURRENT STATUS: HPI For routine f/u  Since we decreased the Effexor XR last mo, has been more emotional.  She will cry easily.  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  But states she wants to have some emotion because since being on the Effexor at the higher doses she was emotionally blunted anyway.  She is not sure if it is normal for her or what.  States she has always been a fairly sensitive person anyway.  She does not laugh or cry inappropriately.  No concussion or TBI.  Patient is able to enjoy things.  Energy and motivation are good.  She is out of work for the summer and enjoying some time off.  No extreme sadness, tearfulness, or feelings of hopelessness.  Sleeps well most of the time. ADLs and personal hygiene are normal. Appetite has not changed.  Weight is stable.  Denies suicidal or homicidal thoughts.  Since she is not working this summer she wonders if we could decrease the [name] dose.  She does not need it to be as high. States that attention is good without easy distractibility.  Able to focus on things and finish tasks to completion.   Patient denies increased energy with decreased need for sleep, increased talkativeness, racing thoughts, impulsivity or risky behaviors, increased spending, increased libido, grandiosity, increased irritability or anger, paranoia, or hallucinations.  Denies dizziness, syncope, seizures, numbness, tingling, tremor, unsteady gait, slurred speech, confusion.  She has been on Austedo for involuntary movements, states it's not really helping when she is stressed.  Individual Medical History/ Review of Systems:  Changes? :No   Past medications for mental health diagnoses include: Spravato for about 3 months (made her worse, not better)  took it 1-2 times a week.  Effexor, Lithium, [name], Xanax, elavil, and 'everything that's out there.' She can't name the meds.  Pristiq,  Prozac, lamictal caused rash, Latuda, Lithium, [name], [name],Trazodone, Austedo, Ingrezza didn't help.   Allergies: Lamictal [lamotrigine], Doxycycline, Effexor [venlafaxine], Savella [milnacipran hcl], and Prilosec [omeprazole]  Current Medications:  Current Outpatient Medications:    amphetamine-dextroamphetamine ([name]) 20 MG tablet, Take 1 tablet (20 mg total) by mouth 2 (two) times daily., Disp: 60 tablet, Rfl: 0   AUSTEDO XR 24 MG TB24, Take 48 mg by mouth daily., Disp: 180 tablet, Rfl: 1   Ca, Mg, K, and Na Oxybates (XYWAV) 500 MG/ML SOLN, Take 4.5 G twice nightly, Disp: 540 mL, Rfl: 5   Cholecalciferol (VITAMIN D3) 125 MCG (5000 UT) CAPS, Take 1 capsule by mouth daily., Disp: , Rfl:    FOLIC ACID PO, Take by mouth., Disp: , Rfl:    Iron-Vitamin C 65-125 MG TABS, Take by mouth daily., Disp: , Rfl:    latanoprost (XALATAN) 0.005 % ophthalmic solution, 1 drop at bedtime., Disp: , Rfl:    levothyroxine (SYNTHROID) 125 MCG tablet, Take 125 mcg by mouth every morning., Disp: , Rfl:    rosuvastatin (CRESTOR) 5 MG tablet, Take  1 tablet  Daily  for Cholesterol (Patient taking differently: Takes 1 tab on Monday, Wednesday and Friday), Disp: 90 tablet, Rfl: 3   UNABLE TO FIND, BiPAP, Disp: , Rfl:  venlafaxine XR (EFFEXOR-XR) 75 MG 24 hr capsule, Take 2 capsules (150 mg total) by mouth every evening., Disp: 270 capsule, Rfl: 1   vitamin E 100 UNIT capsule, Take by mouth daily. Takes 180mg , Disp: , Rfl:    venlafaxine XR (EFFEXOR-XR) 37.5 MG 24 hr capsule, Take 1 capsule (37.5 mg total) by mouth daily with breakfast. With 75 mg daily., Disp: 90 capsule, Rfl: 0 Medication Side Effects: none  Family Medical/ Social History:  Changes? No  MENTAL HEALTH EXAM:  There were no vitals taken for this visit.There is no height or weight on file to calculate BMI.  General Appearance: Casual and Well Groomed  Eye Contact:  Good  Speech:  Clear and Coherent and Normal Rate  Volume:  Normal  Mood:  Euthymic  Affect:  Congruent  Thought Process:  Goal Directed and Descriptions of Associations: Circumstantial  Orientation:  Full (Time, Place, and Person)  Thought Content: Logical   Suicidal Thoughts:  No  Homicidal Thoughts:  No  Memory:  WNL  Judgement:  Good  Insight:  Good  Psychomotor Activity:  Normal  Concentration:  Concentration: Good and Attention Span: Fair  Recall:  Good  Fund of Knowledge: Good  Language: Good  Assets:  Communication Skills Desire for Improvement Financial Resources/Insurance Housing Transportation Vocational/Educational  ADL's:  Intact  Cognition: WNL  Prognosis:  Good   See GeneSight results  DIAGNOSES:    ICD-10-CM   1. Treatment-resistant depression  F32.9     2. MTHFR gene mutation  Z15.89     3. Tardive dyskinesia  G24.01     4. Attention deficit hyperactivity disorder (ADHD), predominantly inattentive type  F90.0     5. 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  [name] filled 04/26/2023. Xywav filled 05/01/2023. I provided 20 minutes of face to face time during this encounter, including time spent before and after the visit in records review, medical decision making, counseling pertinent to today's visit, and charting.   She is a sensitive person to start with, but I think decreasing the Effexor has caused her sensitivity and emotions to be more prominent.  She ultimately would like to get off the Effexor but I think it is best to stay on the same dose for another 5 to 6 weeks, and reevaluate after she sees Dr. Vickey Huger next month.  She understands and agrees. Decreasing the [name] is fine.  Decreased  [name] to 20 mg, 1 p.o. twice daily.   Continue Austedo XR 24 mg, 2 qd.          Continue Effexor XR 75 mg + 37.5 mg daily. Continue Bipap machine. Continue therapy. Return in 6 weeks.   Melony Overly, PA-C

## 2023-05-19 NOTE — Telephone Encounter (Signed)
Livy? From [facility] at 4:17 with questions regarding Arma[facility] [name] 20mg  prescription. Pls rtc [phone] option 2. Ref # [id number]

## 2023-05-22 NOTE — Telephone Encounter (Signed)
Called and they said someone else had already called and it was taken care of.

## 2023-06-06 ENCOUNTER — Other Ambulatory Visit: Payer: Self-pay | Admitting: Neurology

## 2023-06-06 ENCOUNTER — Ambulatory Visit: Payer: BC Managed Care – PPO | Admitting: Adult Health

## 2023-06-06 MED ORDER — XYWAV 500 MG/ML PO SOLN: ORAL | 5 refills | Status: DC

## 2023-06-21 ENCOUNTER — Ambulatory Visit: Payer: BC Managed Care – PPO | Admitting: Neurology

## 2023-06-21 ENCOUNTER — Encounter: Payer: Self-pay | Admitting: Neurology

## 2023-06-21 VITALS — BP 127/87 | HR 85 | Ht 60.0 in | Wt 170.0 lb

## 2023-06-21 DIAGNOSIS — R259 Unspecified abnormal involuntary movements: Secondary | ICD-10-CM | POA: Diagnosis not present

## 2023-06-21 NOTE — Progress Notes (Signed)
Provider:  Melvyn Novas, MD  Primary Care Physician:  Lauren Officer, PA [address] [place] [address]     Originally Referring Provider: Dr Lauren Evener, MD psychiatrist    Now seen by [facility] , Lauren Sor, PA           Chief Complaint according to patient   Patient presents with:     New seen here for  tardive dyskinesia - referral by psychiatrist ( long standing problem, The patient is seen here for SLEEP , is on CPAP but remains excessively sleepy , treated with medication.  not seen for movement disorder and needed a new referral for a  ( for me) new problem.            HISTORY OF PRESENT ILLNESS:  Lauren [name] is a 42 y.o. female patient who is here on  06/21/2023 for  oral facial dyskinesia, tardive dyskinesia , sees psychiatry but ingrezza and austedo have not helped.  .  Chief concern according to patient :    having a lot of involuntary movement with her mouth. Her thumb goes in circles a lot of time, worse on right side. Right foot and right hand. This has been going on 18-24 months . Dr Lauren [name] had put her on ingrezza and then switched her to [facility] and this hasn't been working. Psychiatrist she is seeing now is :  Lauren [name] , [facility], [facility] .  recommended seeing [place] to ensure its not something else. She states that she has made sucking movements on her teeth - these movements are uncontrollable. They exacerbate with stress.             06-21-2023:  she is a highly compliant BiPAP user: I originally met with Lauren [name] in 2018 when she was referred for excessive daytime sleepiness.  Her complaint or concern of daytime sleepiness and fatigue had been followed by her long-term psychiatrist previous psychiatrist Dr. Lafayette [name] and she was treated with antidepressants but stated that she never felt that there was a relief or a decrease in fatigue.  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  Idiopathic hypersomnia is characterized by fragmented sleep, nonrefreshing nonrestorative sleep often with prolonged sleep phases these are patients that may sleep 7 or 8 hours or even 9 hours at night but do not wake up refreshed from it and persist through the day to feel fatigued and sleepy.  She continued for a long time to be treated on antidepressants while we initiated CPAP and finally [name] and Xywav which had recent indication for idiopathic hypersomnia from the FDA.  Today's Epworth Sleepiness Scale was endorsed at only 2 points out of 24 so she is no longer excessively sleepy and daytime but she remains still highly fatigued the fatigue severity scale was endorsed at 46 out of 63 points.     OSA :  [name] is a compliant BiPAP user and she is on a BiPAP at the setting of 17 over 10 cm water with a backup respiratory rate of 10.  In the polysomnography Lauren [name] she is on 17/10 with an AST of 10.  Her residual apnea index is very low 0.4/h  and there is no high air leak either and she is highly compliant 100% of days and 97% of hours with an average use at time of 7-1/2 hours.  Again, it was not the start of BiPAP that made her necessarily feel less fatigued nor less sleepy.  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  For the last 24 to 18 months there have been notes from her behavioral health specialist that she has more or increasing lip movements mouth movements oral movements also squeezing sensation of the right foot and a rolling of the right thumb.  2 medications were initiated both of them did not resolve the issue.  I would like to add  that the patient has weaned off antidepressants and it may be the weaning process that has caused or exacerbated some of the orofacial dyskinesia and peripheral tardive dyskinesia.  It is not unknown that if a dopamine receptor binding medication is reduced the tardive dyskinesia component can be exacerbated.   Her new psychiatrist asked her to wean off AbilifyLowell [name], [name], [phone]     Rv with MD on 06-02-2022: [name] has followed here compliantly as a patient with a chief concern of hypersomnia.  She is also treated for sleep apnea.  Her last 3 visits were with nurse practitioners here in our office and she had still voiced a concern of hypersomnia being present while her sleep apnea was treated.  This persistent hypersomnia is not longer a concern- see EDS- more the fatigue severity-  expressed as a severe fatigue. Tremors have much improved after psychiatrist changed her from Lauren [name]. She had been over a year on Lauren [name] when she developed tremors. Now on Lauren [name] (?) , not entered into Epic.  Lauren [name]. LFTs have remained normal.  Fatigue is what is her chief concern , not sleepiness. FSS at 59/ 63 points- on Adderral and on xywav.  Epworth today on XyWAV 4.5 g bin at 3/ 24 points. Good results for apnea resolution on BiPAP.     Today's BiPAP shows 100% compliance for the last 30 days actually looking back the last 90 days and an average of 7-1/2 hours of BiPAP use each night.  The inspiratory pressure is set at 17 expiratory pressure at 10 and she has an ST rate so-called respiratory capture rate of 10/min so she is using a BiPAP S10 with a resolution of apnea to an AHI or apnea hypopnea index, of 0.5/h with minimal air leak.         Review of Systems: Out of a complete 14 system review, the patient complains of only the following symptoms, and all other reviewed systems are negative.:  Fatigue, sleepiness , snoring, fragmented sleep, improved on BiPAP and after weaning meds  off.    How likely are you to doze in the following situations: 0 = not likely, 1 = slight chance, 2 = moderate chance, 3 = high chance   Sitting and Reading? Watching Television? Sitting inactive in a public place (theater or meeting)? As a passenger in a car for an hour without a break? Lying down in the afternoon when circumstances permit? Sitting and talking to someone? Sitting quietly after lunch without alcohol? In a car, while stopped for a few minutes in traffic?    2/ 24    FSS at 46/ 63 points    Social History   Socioeconomic History  Marital status: Married    Spouse name: Not on file   Number of children: 2   Years of education: Not on file   Highest education level: Not on file  Occupational History   Occupation: TEACHER    Employer: [employer]  Tobacco Use   Smoking status: Never   Smokeless tobacco: Never  Vaping Use   Vaping status: Never Used  Substance and Sexual Activity   Alcohol use: Not Currently    Comment: occ   Drug use: No   Sexual activity: Yes    Partners: Male    Birth control/protection: Surgical    Comment: husband has had a vasectomy  Other Topics Concern   Not on file  Social History Narrative   Parents divorced when she was 1. Never abused. Parents didn't get along.    Teacher, special ed for 17 years.    Married, 2 daughters still live at home.       Caffeine-0-1   Religion-[employer]ist not going to church   Legal none   Social Determinants of Health   Financial Resource Strain: Low Risk  (12/21/2022)   Overall Financial Resource Strain (CARDIA)    Difficulty of Paying Living Expenses: Not very hard  Food Insecurity: No Food Insecurity (12/21/2022)   Hunger Vital Sign    Worried About Running Out of Food in the Last Year: Never true    Ran Out of Food in the Last Year: Never true  Transportation Needs: No Transportation Needs (08/25/2021)   PRAPARE - Administrator, Civil Service (Medical): No     Lack of Transportation (Non-Medical): No  Physical Activity: Inactive (12/21/2022)   Exercise Vital Sign    Days of Exercise per Week: 0 days    Minutes of Exercise per Session: 0 min  Stress: Stress Concern Present (12/21/2022)   [employer] of Occupational Health - Occupational Stress Questionnaire    Feeling of Stress : Rather much  Social Connections: Unknown (03/22/2022)   Received from [facility]   Social Network    Social Network: Not on file    Family History  Problem Relation Age of Onset   Depression Mother    Hypothyroidism Mother    Cervical cancer Mother    Bipolar disorder Mother    Diabetes Mother    Colon polyps Mother    Bipolar disorder Brother    Kidney disease Brother    Alcohol abuse Brother    Heart disease Maternal Aunt    Diabetes Maternal Aunt    Lung cancer Maternal Grandfather    Heart disease Maternal Grandmother    Diabetes Maternal Grandmother    Cancer Maternal Grandmother        SKIN   Hyperlipidemia Maternal Grandmother    Hypertension Maternal Grandmother    Hypothyroidism Maternal Grandmother    Diabetes Cousin    Hypothyroidism Daughter    Colon cancer Neg Hx     Past Medical History:  Diagnosis Date   Anemia    Anxiety    Constipation    Depression    Fibromyalgia    GERD (gastroesophageal reflux disease)    Glaucoma    Hemorrhoids    HSV-1 infection    Hypertension    Hypothyroidism    Idiopathic hypersomnia    Idiopathic hypersomnia    OSA on CPAP    Other abnormal glucose    Preeclampsia     Past Surgical History:  Procedure Laterality Date  ANAL RECTAL MANOMETRY N/A 06/21/2021   Procedure: ANO RECTAL MANOMETRY;  Surgeon: Andria Meuse, MD;  Location: [facility];  Service: General;  Laterality: N/A;   CARPAL TUNNEL RELEASE Right 02/2020   CHOLECYSTECTOMY  2006   thumb surgery Bilateral    trigger thumb   TONSILLECTOMY     WISDOM TOOTH EXTRACTION       Current Outpatient  Medications on File Prior to Visit  Medication Sig Dispense Refill   amphetamine-dextroamphetamine ([name]) 20 MG tablet Take 1 tablet (20 mg total) by mouth 2 (two) times daily. 60 tablet 0   AUSTEDO XR 24 MG TB24 Take 48 mg by mouth daily. 180 tablet 1   Ca, Mg, K, and Na Oxybates (XYWAV) 500 MG/ML SOLN Take 4.5 G twice nightly 540 mL 5   Cholecalciferol (VITAMIN D3) 125 MCG (5000 UT) CAPS Take 1 capsule by mouth daily.     dicyclomine (BENTYL) 20 MG tablet Take 20 mg by mouth 3 (three) times daily.     FOLIC ACID PO Take by mouth.     Iron-Vitamin C 65-125 MG TABS Take by mouth daily.     latanoprost (XALATAN) 0.005 % ophthalmic solution 1 drop at bedtime.     levothyroxine (SYNTHROID) 125 MCG tablet Take 125 mcg by mouth every morning.     rosuvastatin (CRESTOR) 5 MG tablet Take  1 tablet  Daily  for Cholesterol (Patient taking differently: Takes 1 tab on Monday, Wednesday and Friday) 90 tablet 3   UNABLE TO FIND BiPAP     venlafaxine XR (EFFEXOR-XR) 37.5 MG 24 hr capsule Take 1 capsule (37.5 mg total) by mouth daily with breakfast. With 75 mg daily. 90 capsule 0   venlafaxine XR (EFFEXOR-XR) 75 MG 24 hr capsule Take 2 capsules (150 mg total) by mouth every evening. 270 capsule 1   vitamin E 100 UNIT capsule Take by mouth daily. Takes 180mg      No current facility-administered medications on file prior to visit.    Allergies  Allergen Reactions   Lamictal [Lamotrigine] Rash   Doxycycline Other (See Comments)    GI UPSET   Effexor [Venlafaxine] Other (See Comments)    DYSPHORIA   Savella [Milnacipran Hcl] Other (See Comments)    NO RELIEF   Prilosec [Omeprazole] Itching     DIAGNOSTIC DATA (LABS, IMAGING, TESTING) - I reviewed patient records, labs, notes, testing and imaging myself where available.  Lab Results  Component Value Date   WBC 8.8 11/11/2021   HGB 14.6 11/11/2021   HCT 43.7 11/11/2021   MCV 89 11/11/2021   PLT 171 11/11/2021      Component Value Date/Time    NA 141 06/02/2022 1208   K 4.7 06/02/2022 1208   CL 102 06/02/2022 1208   CO2 22 06/02/2022 1208   GLUCOSE 88 06/02/2022 1208   GLUCOSE 95 05/27/2021 1240   BUN 15 06/02/2022 1208   CREATININE 0.68 06/02/2022 1208   CREATININE 0.69 01/27/2021 1700   CALCIUM 9.7 06/02/2022 1208   PROT 7.3 06/02/2022 1208   ALBUMIN 4.9 06/02/2022 1208   AST 12 06/02/2022 1208   ALT 7 06/02/2022 1208   ALKPHOS 55 06/02/2022 1208   BILITOT 0.6 06/02/2022 1208   GFRNONAA >60 05/27/2021 1240   GFRNONAA 107 01/27/2021 1700   GFRAA 124 01/27/2021 1700   Lab Results  Component Value Date   CHOL 170 10/29/2020   HDL 58 10/29/2020   LDLCALC 88 10/29/2020   LDLDIRECT 114.9 04/30/2012  TRIG 139 10/29/2020   CHOLHDL 2.9 10/29/2020   Lab Results  Component Value Date   HGBA1C 5.9 (H) 01/27/2021   Lab Results  Component Value Date   VITAMINB12 687 06/02/2022   Lab Results  Component Value Date   TSH 2.610 06/02/2022    PHYSICAL EXAM:  Today's Vitals   06/21/23 1323  BP: 127/87  Pulse: 85  Weight: 170 lb (77.1 kg)  Height: 5' (1.524 m)   Body mass index is 33.2 kg/m.   Wt Readings from Last 3 Encounters:  06/21/23 170 lb (77.1 kg)  04/06/23 161 lb (73 kg)  12/06/22 164 lb (74.4 kg)     Ht Readings from Last 3 Encounters:  06/21/23 5' (1.524 m)  04/06/23 5' (1.524 m)  12/06/22 5' (1.524 m)      General: The patient is awake, alert and appears not in acute distress. The patient is well groomed. Head: Normocephalic, atraumatic.  Neck is supple. Cardiovascular:  Regular rate and cardiac rhythm by pulse,  without distended neck veins. Respiratory: Lungs are clear to auscultation.  Skin:  Without evidence of ankle edema, or rash. Trunk: The patient's posture is erect.   NEUROLOGIC EXAM: The patient is awake and alert, oriented to place and time.   Memory subjective described as intact.  Attention span & concentration ability appears normal.  Speech is fluent,  without   dysarthria, dysphonia or aphasia.  Mood and affect are appropriate.   Cranial nerves: no loss of smell or taste reported  Pupils are equal and briskly reactive to light.  Extraocular movements in vertical and horizontal planes were intact and without nystagmus. No Diplopia. Visual fields by finger perimetry are intact. Hearing was intact to soft voice and finger rubbing.    Facial sensation intact to fine touch.  Facial motor strength is symmetric and tongue and uvula move midline.  Neck ROM : rotation, tilt and flexion extension were normal for age and shoulder shrug was symmetrical.    Motor exam:  Symmetric bulk, tone and ROM.   Elevated, waxy - tone without cog -wheeling, no clasp knife rigor, no spasticity-  symmetric grip strength .   Sensory: Proprioception tested in the upper extremities was normal.   Coordination: Rapid alternating movements in the fingers/hands were of normal speed.  The Finger-to-nose maneuver was intact without evidence of ataxia, dysmetria or tremor.   Gait and station: Patient could rise unassisted from a seated position, walked without assistive device.  Stance is of normal width/ base .   ASSESSMENT AND PLAN:  42 y.o. year old female  here with:    1) no visible dyskinesia or tremor today, poor relaxation but no cogwheel and no involuntary movement . Finger to nose works well.  Not parkinsonian-  no balance issues, not stooped.   I doubt I can et a DAT scan approved, her  reported involuntary movements are stress induced and induce stress in return. I would like to suggest to return on low dose Lauren [name] and keep on the AUSTEDO medication.   I am unaware of Xywav / [name] inducing any dyskinesias. I want the patient to continue h her BiPAPuse.    We plan to follow up  through our NP within 12 months for hypersomnia and BiPAP. Marland Kitchen    CC: I will share my notes with Lauren Guitar, PA .  After spending a total time of  35  minutes face to face and  additional time for physical and neurologic examination, review  of laboratory studies,  personal review of imaging studies, reports and results of other testing and review of referral information / records as far as provided in visit,   Electronically signed by: Lauren Novas, MD 06/21/2023 1:27 PM  [facility] Board certified by The [employer] of Sleep Medicine and Diplomate of the [employer] of Sleep Medicine. Board certified In Neurology through the ABPN, Fellow of the [employer] of Neurology.

## 2023-06-21 NOTE — Patient Instructions (Signed)
Hypersomnia Hypersomnia is a condition in which a person feels very tired during the day even though the person gets plenty of sleep at night. A person with this condition may take naps during the day and may find it very difficult to wake up from sleep. Hypersomnia may affect a person's ability to think, concentrate, drive, or remember things. What are the causes? The cause of this condition may not be known. Possible causes include: Taking certain medicines. Using drugs or alcohol. Sleep disorders, such as narcolepsy and sleep apnea. Injury to the head, brain, or spinal cord. Tumors. Certain medical conditions. These include: Depression. Diabetes. Gastroesophageal reflux disease (GERD). An underactive thyroid gland (hypothyroidism). What are the signs or symptoms? The main symptoms of hypersomnia include: Feeling very tired throughout the day, regardless of how much sleep you got the night before. Having trouble waking up. Others may find it difficult to wake you up when you are sleeping. Sleeping for longer and longer periods at a time. Taking naps throughout the day. Other symptoms may include: Feeling restless, anxious, or annoyed. Lacking energy. Having trouble with: Remembering. Speaking. Thinking. Loss of appetite. Seeing, hearing, tasting, smelling, or feeling things that are not real (hallucinations). How is this diagnosed? This condition may be diagnosed based on: Your symptoms and medical history. Your sleeping habits. Your health care provider may ask you to write down your sleeping habits in a daily sleep log, along with any symptoms you have. A series of tests that are done while you sleep (sleep study or polysomnogram). A test that measures how quickly you can fall asleep during the day (daytime nap study or multiple sleep latency test). How is this treated? This condition may be treated by: Following a regular sleep routine. Making lifestyle changes, such as  changing your eating habits, getting regular exercise, and avoiding alcohol or caffeinated beverages. Taking medicines to make you more alert (stimulants) during the day. Treating any underlying medical causes of hypersomnia. Follow these instructions at home: Sleep habits Stick to a routine that includes going to bed and waking up at the same times every day and night. Practice a relaxing bedtime routine. This may include reading, meditation, deep breathing, or taking a warm bath before going to sleep. Exercise regularly as told by your health care provider. However, avoid exercising in the hours right before bedtime. Keep your sleep environment at a cooler temperature, darkened, and quiet. Sleep with pillows and a mattress that are comfortable and supportive. Schedule short 20-minute naps for when you feel sleepiest during the day. Talk with your employer or teachers about your hypersomnia. If possible, adjust your schedule so that: You have a regular daytime work schedule. You can take a scheduled nap during the day. You do not have to work or be active at night. Do not eat a heavy meal for a few hours before bedtime. Eat your meals at about the same times every day. Safety  Do not drive or use machinery if you are sleepy. Ask your health care provider if it is safe for you to drive. Wear a life jacket when swimming or spending time near water. General instructions  Take over-the-counter and prescription medicines only as told by your health care provider. This includes supplements. Avoid drinking alcohol or caffeinated beverages. Keep a sleep log that will help your health care provider manage your condition. This may include information about: What time you go to bed each night. How often you wake up at night. How many hours   you sleep at night. How often and for how long you nap during the day. Any observations from others, such as leg movements during sleep, sleep walking, or  snoring. Keep all follow-up visits. This is important. Contact a health care provider if: You have new symptoms. Your symptoms get worse. Get help right away if: You have thoughts about hurting yourself or someone else. Get help right away if you feel like you may hurt yourself or others, or have thoughts about taking your own life. Go to your nearest emergency room or: Call 911. Call the National Suicide Prevention Lifeline at (614)428-1551 or 988. This is open 24 hours a day. Text the Crisis Text Line at (307)023-4669. Summary Hypersomnia refers to a condition in which you feel very tired during the day even though you get plenty of sleep at night. A person with this condition may take naps during the day and may find it very difficult to wake up from sleep. Hypersomnia may affect a person's ability to think, concentrate, drive, or remember things. Treatment may include a regular sleep routine and making some lifestyle changes. This information is not intended to replace advice given to you by your health care provider. Make sure you discuss any questions you have with your health care provider. Document Revised: 10/18/2021 Document Reviewed: 10/18/2021 Elsevier Patient Education  2024 [employer].

## 2023-06-23 ENCOUNTER — Encounter: Payer: Self-pay | Admitting: Physician Assistant

## 2023-06-26 ENCOUNTER — Other Ambulatory Visit: Payer: Self-pay

## 2023-06-26 ENCOUNTER — Telehealth: Payer: Self-pay | Admitting: Physician Assistant

## 2023-06-26 NOTE — Telephone Encounter (Signed)
Pended.

## 2023-06-26 NOTE — Telephone Encounter (Signed)
PT lvm that she needs her adderall 20 mg sent to the [facility]. Quantity is 60

## 2023-06-27 ENCOUNTER — Ambulatory Visit: Payer: BC Managed Care – PPO | Admitting: Neurology

## 2023-06-27 ENCOUNTER — Other Ambulatory Visit: Payer: Self-pay

## 2023-06-27 MED ORDER — AMPHETAMINE-DEXTROAMPHETAMINE 20 MG PO TABS: 20.0000 mg | ORAL_TABLET | Freq: Two times a day (BID) | ORAL | 0 refills | Status: DC

## 2023-07-05 ENCOUNTER — Ambulatory Visit: Payer: BC Managed Care – PPO | Admitting: Physician Assistant

## 2023-07-05 ENCOUNTER — Ambulatory Visit: Payer: BC Managed Care – PPO | Admitting: Obstetrics & Gynecology

## 2023-07-05 ENCOUNTER — Encounter: Payer: Self-pay | Admitting: Obstetrics & Gynecology

## 2023-07-05 ENCOUNTER — Encounter: Payer: Self-pay | Admitting: Physician Assistant

## 2023-07-05 VITALS — BP 133/87 | HR 98 | Ht 60.0 in | Wt 172.8 lb

## 2023-07-05 DIAGNOSIS — N939 Abnormal uterine and vaginal bleeding, unspecified: Secondary | ICD-10-CM | POA: Diagnosis not present

## 2023-07-05 DIAGNOSIS — Z1589 Genetic susceptibility to other disease: Secondary | ICD-10-CM | POA: Diagnosis not present

## 2023-07-05 DIAGNOSIS — F900 Attention-deficit hyperactivity disorder, predominantly inattentive type: Secondary | ICD-10-CM | POA: Diagnosis not present

## 2023-07-05 DIAGNOSIS — F329 Major depressive disorder, single episode, unspecified: Secondary | ICD-10-CM | POA: Diagnosis not present

## 2023-07-05 DIAGNOSIS — G2401 Drug induced subacute dyskinesia: Secondary | ICD-10-CM | POA: Diagnosis not present

## 2023-07-05 DIAGNOSIS — G4711 Idiopathic hypersomnia with long sleep time: Secondary | ICD-10-CM

## 2023-07-05 NOTE — Progress Notes (Signed)
   GYN VISIT Patient name: Lauren [name] MRN [id number]  Date of birth: 1981-07-16 Chief Complaint:   Menstrual Problem (This period has been irregular. )  History of Present Illness:   Lauren [name] is a 42 y.o. G2P2 female being seen today for the following concern:  -Menses are typically every month around the same time- typically 3-4 days where she will use about 4-5 pads per day.  She has brought a sheet with her listed periods.  Typically cycles q 25-29 days.   Recently has had some intermittent spotting since end of July- when she wipes she will see some on the toilet paper.  She will have 2 days of spotting then it will resolve for a day or so then return.  She had one day of moderate bleeding, but no heavy bleeding since then. Wearing a light pad, but mostly just notes spotting when she wipes.  Denies dysmenorrhea.  Reports no other acute complaints.  Contraception: vasectomy  Patient's last menstrual period was 07/01/2023.    Review of Systems:   Pertinent items are noted in HPI Denies fever/chills, dizziness, headaches, visual disturbances, fatigue, shortness of breath, chest pain, abdominal pain, vomiting, no problems with bowel movements, urination, or intercourse unless otherwise stated above.  Pertinent History Reviewed:   Past Surgical History:  Procedure Laterality Date   ANAL RECTAL MANOMETRY N/A 06/21/2021   Procedure: ANO RECTAL MANOMETRY;  Surgeon: Andria Meuse, MD;  Location: [facility];  Service: General;  Laterality: N/A;   CARPAL TUNNEL RELEASE Right 02/2020   CHOLECYSTECTOMY  2006   thumb surgery Bilateral    trigger thumb   TONSILLECTOMY     WISDOM TOOTH EXTRACTION      Past Medical History:  Diagnosis Date   Anemia    Anxiety    Constipation    Depression    Fibromyalgia    GERD (gastroesophageal reflux disease)    Glaucoma    Hemorrhoids    HSV-1 infection    Hypertension    Hypothyroidism    Idiopathic hypersomnia     Idiopathic hypersomnia    OSA on CPAP    Other abnormal glucose    Preeclampsia    Reviewed problem list, medications and allergies. Physical Assessment:   Vitals:   07/05/23 1334  BP: 133/87  Pulse: 98  Weight: 172 lb 12.8 oz (78.4 kg)  Height: 5' (1.524 m)  Body mass index is 33.75 kg/m.       Physical Examination:   General appearance: alert, well appearing, and in no distress  Psych: mood appropriate, normal affect  Skin: warm & dry   Cardiovascular: normal heart rate noted  Respiratory: normal respiratory effort, no distress  Abdomen: soft, non-tender, no rebound, no guarding  Pelvic: examination not indicated  Extremities: no edema   Chaperone: N/A    Assessment & Plan:  1) Perimenopausal -based on history suspect perimenpausal bleeding, AUB-O -discussed potential options including monitoring bleeding pattern or low dose OCP or other option to regulate menses -for now plans to monitor and will f/u in 3 mos when it's time for her annual   No orders of the defined types were placed in this encounter.   Return for Nov annual.   Myna Hidalgo, DO Attending Obstetrician & Gynecologist, [facility] for [facility], [facility]

## 2023-07-05 NOTE — Progress Notes (Signed)
[facility]  Patient ID: Lauren [name],  MRN: [id number]  PCP: Delma Officer, PA  Date of Evaluation: 07/05/2023 Time spent:20 minutes  Chief Complaint:  Chief Complaint   Depression; Follow-up    HISTORY/CURRENT STATUS: HPI For routine f/u  See phone notes, she didn't want to start the [name] back.  She really wants to decrease the Effexor if at all possible.  States that she has had a little depression, just felt blue for no obvious reason but upon further reflection, states that it is most likely because she will be starting back to school next week.  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  "I am learning myself and what my triggers are.  So I really do not want to go back on the [name] for depression."  She is a little anxious about it but not to the extreme.  No panic attacks.  Patient is able to enjoy things.  Energy and motivation are fair to good depending on the day.  No extreme sadness, tearfulness, or feelings of hopelessness.  Sleeps well. ADLs and personal hygiene are normal.   Denies any changes in concentration, making decisions, or remembering things.  Appetite has not changed.  Weight is stable.  Denies suicidal or homicidal thoughts.  Patient denies increased energy with decreased need for sleep, increased talkativeness, racing thoughts, impulsivity or risky behaviors, increased spending, increased libido, grandiosity, increased irritability or anger, paranoia, or hallucinations.  Denies dizziness, syncope, seizures, numbness, tingling, tremor, tics (or they occur rarely, mostly licking her lips), unsteady gait, slurred speech, confusion. Denies muscle or joint pain, stiffness, or dystonia.  Individual Medical History/ Review of Systems: Changes? :No   Past medications for mental health diagnoses include: Spravato for about 3 months (made her worse, not better)  took it 1-2  times a week.  Effexor, Lithium, [name], Xanax, elavil, and 'everything that's out there.' She can't name the meds.  Pristiq,  Prozac, lamictal caused rash, Latuda, Lithium, [name], [name],Trazodone, Austedo, Ingrezza didn't help.   Allergies: Lamictal [lamotrigine], Doxycycline, Effexor [venlafaxine], Savella [milnacipran hcl], and Prilosec [omeprazole]  Current Medications:  Current Outpatient Medications:    amphetamine-dextroamphetamine ([name]) 20 MG tablet, Take 1 tablet (20 mg total) by mouth 2 (two) times daily., Disp: 60 tablet, Rfl: 0   AUSTEDO XR 24 MG TB24, Take 48 mg by mouth daily., Disp: 180 tablet, Rfl: 1   Ca, Mg, K, and Na Oxybates (XYWAV) 500 MG/ML SOLN, Take 4.5 G twice nightly, Disp: 540 mL, Rfl: 5   Cholecalciferol (VITAMIN D3) 125 MCG (5000 UT) CAPS, Take 1 capsule by mouth daily., Disp: , Rfl:    dicyclomine (BENTYL) 20 MG tablet, Take 20 mg by mouth 3 (three) times daily., Disp: , Rfl:    FOLIC ACID PO, Take by mouth., Disp: , Rfl:    Iron-Vitamin C 65-125 MG TABS, Take by mouth daily., Disp: , Rfl:    latanoprost (XALATAN) 0.005 % ophthalmic solution, 1 drop at bedtime., Disp: , Rfl:    levothyroxine (SYNTHROID) 125 MCG tablet, Take 125 mcg by mouth every morning., Disp: , Rfl:    rosuvastatin (CRESTOR) 5 MG tablet, Take  1 tablet  Daily  for Cholesterol (Patient taking differently: Takes 1 tab on Monday, Wednesday and Friday), Disp: 90 tablet, Rfl: 3   UNABLE TO FIND, BiPAP (Patient not taking: Reported on 07/05/2023), Disp: , Rfl:    venlafaxine XR (EFFEXOR-XR) 75  MG 24 hr capsule, Take 2 capsules (150 mg total) by mouth every evening., Disp: 270 capsule, Rfl: 1   vitamin E 100 UNIT capsule, Take by mouth daily. Takes 180mg , Disp: , Rfl:    venlafaxine XR (EFFEXOR-XR) 37.5 MG 24 hr capsule, Take 1 capsule (37.5 mg total) by mouth daily with breakfast. With 75 mg daily., Disp: 90 capsule, Rfl: 0 Medication Side Effects: none  Family Medical/ Social History:  Changes? No  MENTAL HEALTH EXAM:  There were no vitals taken for this visit.There is no height or weight on file to calculate BMI.  General Appearance: Casual and Well Groomed  Eye Contact:  Good  Speech:  Clear and Coherent and Normal Rate  Volume:  Normal  Mood:  Euthymic  Affect:  Congruent  Thought Process:  Goal Directed and Descriptions of Associations: Circumstantial  Orientation:  Full (Time, Place, and Person)  Thought Content: Logical   Suicidal Thoughts:  No  Homicidal Thoughts:  No  Memory:  WNL  Judgement:  Good  Insight:  Good  Psychomotor Activity:  Normal  Concentration:  Concentration: Good and Attention Span: Fair  Recall:  Good  Fund of Knowledge: Good  Language: Good  Assets:  Communication Skills Desire for Improvement Financial Resources/Insurance Housing Transportation Vocational/Educational  ADL's:  Intact  Cognition: WNL  Prognosis:  Good   See GeneSight results  DIAGNOSES:    ICD-10-CM   1. Treatment-resistant depression  F32.9     2. MTHFR gene mutation  Z15.89     3. Tardive dyskinesia  G24.01     4. Attention deficit hyperactivity disorder (ADHD), predominantly inattentive type  F90.0     5. 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  [name] filled 06/27/2023.  Xywav filled 06/30/2023. I provided 20 minutes of face to face time during this encounter, including time spent before and after the visit in records review, medical decision making, counseling pertinent to today's visit, and charting.   Rationale behind restarting [name] was given.  She still prefers not to take it if at all possible.  She really wants to get off as many meds that she can, not add something back.  That is fine but we should keep it as an option.  She understands.  We also discussed the Effexor.  She would like to try to decrease that dose.  She starts back to school next week.  We agreed for her to  wait at least 3 weeks into the school year before she decreases the dose.  She will be at a new school, and I do not want to make any changes that will cause even more stress for her.  She understands and agrees.  Continue [name] 20 mg, 1 p.o. twice daily.   Continue Austedo XR 24 mg, 2 qd.          Continue Effexor 75 mg +37.5 mg daily.  In 3 to 4 weeks she can decrease to 75 mg daily if she chooses, and then another decrease to 37.5 mg after a few weeks has gone by.  She verbalizes understanding. Continue Bipap machine. Continue therapy. Return in 3 months.  Melony Overly, PA-C

## 2023-07-07 ENCOUNTER — Encounter: Payer: Self-pay | Admitting: Obstetrics & Gynecology

## 2023-07-09 ENCOUNTER — Encounter: Payer: Self-pay | Admitting: Obstetrics & Gynecology

## 2023-07-12 ENCOUNTER — Ambulatory Visit (INDEPENDENT_AMBULATORY_CARE_PROVIDER_SITE_OTHER): Payer: BC Managed Care – PPO | Admitting: *Deleted

## 2023-07-12 ENCOUNTER — Other Ambulatory Visit (HOSPITAL_COMMUNITY)
Admission: RE | Admit: 2023-07-12 | Discharge: 2023-07-12 | Disposition: A | Discharge status: Home / Self Care | Payer: BC Managed Care – PPO | Source: Ambulatory Visit | Attending: Obstetrics & Gynecology | Admitting: Obstetrics & Gynecology

## 2023-07-12 DIAGNOSIS — N898 Other specified noninflammatory disorders of vagina: Secondary | ICD-10-CM | POA: Insufficient documentation

## 2023-07-12 DIAGNOSIS — R300 Dysuria: Secondary | ICD-10-CM

## 2023-07-12 DIAGNOSIS — R319 Hematuria, unspecified: Secondary | ICD-10-CM

## 2023-07-12 LAB — POCT URINALYSIS DIPSTICK OB
Glucose, UA: NEGATIVE
Ketones, UA: NEGATIVE
Leukocytes, UA: NEGATIVE
Nitrite, UA: NEGATIVE
POC,PROTEIN,UA: NEGATIVE

## 2023-07-12 NOTE — Progress Notes (Signed)
   NURSE VISIT- VAGINITIS/STD/POC  SUBJECTIVE:  Lauren [name] is a 42 y.o. G2P2 GYN patientfemale here for a vaginal swab for vaginitis screening.  She reports the following symptoms: discharge described as white and thick for 1 month. Also experiencing burning with urination and after intercourse.  Denies abnormal vaginal bleeding, significant pelvic pain, fever.  OBJECTIVE:  LMP 07/01/2023   Appears well, in no apparent distress  ASSESSMENT: Vaginal swab for vaginitis screening Trace blood in urine dip  PLAN: Self-collected vaginal probe for Bacterial Vaginosis, Yeast sent to lab. Pt declined STD screening Urine culture sent Treatment: to be determined once results are received Follow-up as needed if symptoms persist/worsen, or new symptoms develop  Lauren [name]  07/12/2023 4:11 PM

## 2023-07-13 LAB — URINALYSIS, ROUTINE W REFLEX MICROSCOPIC
Bilirubin, UA: NEGATIVE
Glucose, UA: NEGATIVE
Ketones, UA: NEGATIVE
Leukocytes,UA: NEGATIVE
Nitrite, UA: NEGATIVE
Protein,UA: NEGATIVE
RBC, UA: NEGATIVE
Specific Gravity, UA: 1.012 (ref 1.005–1.030)
Urobilinogen, Ur: 0.2 mg/dL (ref 0.2–1.0)
pH, UA: 7.5 (ref 5.0–7.5)

## 2023-07-14 LAB — CERVICOVAGINAL ANCILLARY ONLY
Bacterial Vaginitis (gardnerella): NEGATIVE
Candida Glabrata: NEGATIVE
Candida Vaginitis: NEGATIVE
Comment: NEGATIVE
Comment: NEGATIVE
Comment: NEGATIVE

## 2023-07-14 LAB — URINE CULTURE

## 2023-07-19 ENCOUNTER — Other Ambulatory Visit: Payer: Self-pay | Admitting: Physician Assistant

## 2023-07-26 ENCOUNTER — Ambulatory Visit: Payer: BC Managed Care – PPO | Admitting: Neurology

## 2023-08-01 ENCOUNTER — Other Ambulatory Visit: Payer: Self-pay

## 2023-08-01 ENCOUNTER — Telehealth: Payer: Self-pay | Admitting: Physician Assistant

## 2023-08-01 NOTE — Telephone Encounter (Signed)
Rx sent to [facility]. She gets Austedo from [employer].

## 2023-08-01 NOTE — Telephone Encounter (Signed)
Next visit is 09/26/23. Requesting refill on [name] 20 mg called to:  [facility], [place] - [address]   Phone: [phone]  Fax: [phone]    It is in stock.

## 2023-08-02 MED ORDER — AMPHETAMINE-DEXTROAMPHETAMINE 20 MG PO TABS: 20.0000 mg | ORAL_TABLET | Freq: Two times a day (BID) | ORAL | 0 refills | Status: DC

## 2023-08-14 ENCOUNTER — Telehealth: Payer: Self-pay | Admitting: Physician Assistant

## 2023-08-14 MED ORDER — VENLAFAXINE HCL ER 37.5 MG PO CP24: 37.5000 mg | ORAL_CAPSULE | Freq: Every day | ORAL | 0 refills | Status: DC

## 2023-08-14 NOTE — Telephone Encounter (Signed)
Patient called in for refill on Effexor XR 37.5mg . Ph: [phone] Appt 11/5 Pharmacy [facility] [address] [place], [place]

## 2023-08-14 NOTE — Telephone Encounter (Signed)
Sent!

## 2023-08-30 ENCOUNTER — Other Ambulatory Visit (HOSPITAL_COMMUNITY): Payer: Self-pay | Admitting: Adult Health

## 2023-08-30 DIAGNOSIS — Z1231 Encounter for screening mammogram for malignant neoplasm of breast: Secondary | ICD-10-CM

## 2023-09-11 ENCOUNTER — Telehealth: Payer: Self-pay | Admitting: Physician Assistant

## 2023-09-11 ENCOUNTER — Other Ambulatory Visit: Payer: Self-pay

## 2023-09-11 NOTE — Telephone Encounter (Signed)
pended

## 2023-09-11 NOTE — Telephone Encounter (Signed)
Pt requests her [name] 20mg  to send to: [facility] - [place], [place] - [facility] AT Portal to [facility] Sites  Appt 11/5

## 2023-09-12 MED ORDER — AMPHETAMINE-DEXTROAMPHETAMINE 20 MG PO TABS: 20.0000 mg | ORAL_TABLET | Freq: Two times a day (BID) | ORAL | 0 refills | Status: DC

## 2023-09-26 ENCOUNTER — Ambulatory Visit: Payer: BC Managed Care – PPO | Admitting: Physician Assistant

## 2023-09-26 ENCOUNTER — Encounter: Payer: Self-pay | Admitting: Physician Assistant

## 2023-09-26 DIAGNOSIS — F900 Attention-deficit hyperactivity disorder, predominantly inattentive type: Secondary | ICD-10-CM

## 2023-09-26 DIAGNOSIS — G2401 Drug induced subacute dyskinesia: Secondary | ICD-10-CM | POA: Diagnosis not present

## 2023-09-26 DIAGNOSIS — F329 Major depressive disorder, single episode, unspecified: Secondary | ICD-10-CM | POA: Diagnosis not present

## 2023-09-26 DIAGNOSIS — Z1589 Genetic susceptibility to other disease: Secondary | ICD-10-CM

## 2023-09-26 DIAGNOSIS — G4711 Idiopathic hypersomnia with long sleep time: Secondary | ICD-10-CM

## 2023-09-26 MED ORDER — AMPHETAMINE-DEXTROAMPHETAMINE 20 MG PO TABS: 20.0000 mg | ORAL_TABLET | Freq: Two times a day (BID) | ORAL | 0 refills | Status: DC

## 2023-09-26 NOTE — Progress Notes (Signed)
[facility]  Patient ID: Lauren [name],  MRN: [id number]  PCP: Delma Officer, PA  Date of Evaluation: 09/26/2023 Time spent:25 minutes  Chief Complaint:  Chief Complaint   Depression; ADD    HISTORY/CURRENT STATUS: HPI For routine f/u  Overall Lauren [name] is doing very well.  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  We have been decreasing the Effexor.  She has tolerated the slow wean very well.  At this point she feels like staying on the same dose would be the best plan although she does want to get off of it in the future if at all possible.  With that being the holidays and winter coming up she does not think it is wise to decrease further at this point.    Patient is able to enjoy things.  Energy and motivation are fair to good depending on the day.  No extreme sadness, tearfulness, or feelings of hopelessness.  Sleeps well most of the time. ADLs and personal hygiene are normal.  Appetite has not changed.  Weight is stable.  No anxiety.  Denies suicidal or homicidal thoughts.  States that attention is good without easy distractibility.  Able to focus on things and finish tasks to completion.   Patient denies increased energy with decreased need for sleep, increased talkativeness, racing thoughts, impulsivity or risky behaviors, increased spending, increased libido, grandiosity, increased irritability or anger, paranoia, or hallucinations.  Denies dizziness, syncope, seizures, numbness, tingling, tremor, tics (or they occur rarely, mostly licking her lips), unsteady gait, slurred speech, confusion. Denies muscle or joint pain, stiffness, or dystonia.  Individual Medical History/ Review of Systems: Changes? :No   Past medications for mental health diagnoses include: Spravato for about 3 months (made her worse, not better)  took it 1-2 times a week.  Effexor,  Lithium, [name], Xanax, elavil, and 'everything that's out there.' She can't name the meds.  Pristiq,  Prozac, lamictal caused rash, Latuda, Lithium, [name], [name],Trazodone, Austedo, Ingrezza didn't help.   Allergies: Lamictal [lamotrigine], Doxycycline, Effexor [venlafaxine], Savella [milnacipran hcl], and Prilosec [omeprazole]  Current Medications:  Current Outpatient Medications:    [START ON 11/10/2023] amphetamine-dextroamphetamine ([name]) 20 MG tablet, Take 1 tablet (20 mg total) by mouth 2 (two) times daily., Disp: 60 tablet, Rfl: 0   AUSTEDO XR 24 MG TB24, TAKE 2 TABLETS BY MOUTH 1 TIME A DAY, Disp: 180 tablet, Rfl: 1   Ca, Mg, K, and Na Oxybates (XYWAV) 500 MG/ML SOLN, Take 4.5 G twice nightly, Disp: 540 mL, Rfl: 5   Cholecalciferol (VITAMIN D3) 125 MCG (5000 UT) CAPS, Take 1 capsule by mouth daily., Disp: , Rfl:    FOLIC ACID PO, Take by mouth., Disp: , Rfl:    Iron-Vitamin C 65-125 MG TABS, Take by mouth daily., Disp: , Rfl:    latanoprost (XALATAN) 0.005 % ophthalmic solution, 1 drop at bedtime., Disp: , Rfl:    levothyroxine (SYNTHROID) 125 MCG tablet, Take 125 mcg by mouth every morning., Disp: , Rfl:    rosuvastatin (CRESTOR) 5 MG tablet, Take  1 tablet  Daily  for Cholesterol (Patient taking differently: Takes 1 tab on Monday, Wednesday and Friday), Disp: 90 tablet, Rfl: 3   venlafaxine XR (EFFEXOR-XR) 75 MG 24 hr capsule, Take 2 capsules (150 mg total) by mouth every evening. (Patient taking differently: Take 75 mg by mouth every evening.), Disp: 270 capsule,  Rfl: 1   vitamin E 100 UNIT capsule, Take by mouth daily. Takes 180mg , Disp: , Rfl:    [START ON 10/12/2023] amphetamine-dextroamphetamine ([name]) 20 MG tablet, Take 1 tablet (20 mg total) by mouth 2 (two) times daily., Disp: 60 tablet, Rfl: 0   dicyclomine (BENTYL) 20 MG tablet, Take 20 mg by mouth 3 (three) times daily. (Patient not taking: Reported on 09/26/2023), Disp: , Rfl:    UNABLE TO FIND, BiPAP  (Patient not taking: Reported on 07/05/2023), Disp: , Rfl:  Medication Side Effects: none  Family Medical/ Social History: Changes? Changed schools.  MENTAL HEALTH EXAM:  There were no vitals taken for this visit.There is no height or weight on file to calculate BMI.  General Appearance: Casual and Well Groomed  Eye Contact:  Good  Speech:  Clear and Coherent and Normal Rate  Volume:  Normal  Mood:  Euthymic  Affect:  Congruent  Thought Process:  Goal Directed and Descriptions of Associations: Circumstantial  Orientation:  Full (Time, Place, and Person)  Thought Content: Logical   Suicidal Thoughts:  No  Homicidal Thoughts:  No  Memory:  WNL  Judgement:  Good  Insight:  Good  Psychomotor Activity:  Normal and without any abnormal facial, truncal, or lower extremity movements  Concentration:  Concentration: Good and Attention Span: Fair  Recall:  Good  Fund of Knowledge: Good  Language: Good  Assets:  Communication Skills Desire for Improvement Financial Resources/Insurance Housing Resilience Transportation Vocational/Educational  ADL's:  Intact  Cognition: WNL  Prognosis:  Good   See GeneSight results  DIAGNOSES:    ICD-10-CM   1. Treatment-resistant depression  F32.9     2. MTHFR gene mutation  Z15.89     3. Tardive dyskinesia  G24.01     4. Attention deficit hyperactivity disorder (ADHD), predominantly inattentive type  F90.0     5. 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  [name] filled 09/12/2023.  Xywav filled 08/29/2023. I provided 25 minutes of face to face time during this encounter, including time spent before and after the visit in records review, medical decision making, counseling pertinent to today's visit, and charting.   I am glad to see her doing so well!  I agree with her, we should not try to continue weaning any of her medications because of typical increased stress around the  holidays and in the wintertime.  Continue [name] 20 mg, 1 p.o. twice daily.   Continue Austedo XR 24 mg, 2 qd.          Continue Effexor 75 mg daily. Continue Bipap machine. Continue therapy. Return in 2 months.  Melony Overly, PA-C

## 2023-10-06 ENCOUNTER — Ambulatory Visit: Payer: BC Managed Care – PPO | Admitting: Physician Assistant

## 2023-10-11 ENCOUNTER — Ambulatory Visit: Payer: BC Managed Care – PPO | Admitting: Adult Health

## 2023-10-11 ENCOUNTER — Encounter: Payer: Self-pay | Admitting: Adult Health

## 2023-10-11 VITALS — BP 132/80 | HR 90 | Ht 60.0 in | Wt 168.0 lb

## 2023-10-11 DIAGNOSIS — Z01419 Encounter for gynecological examination (general) (routine) without abnormal findings: Secondary | ICD-10-CM | POA: Diagnosis not present

## 2023-10-11 DIAGNOSIS — N951 Menopausal and female climacteric states: Secondary | ICD-10-CM | POA: Diagnosis not present

## 2023-10-11 NOTE — Progress Notes (Signed)
Patient ID: Lauren [name], female   DOB: [birth year], 42 y.o.   MRN: [id number] History of Present Illness: Lauren [name] is a 42 year old white female, married G2P2 in for a well woman gyn exam. Periods more regular now. May have occasional hot flash.     Component Value Date/Time   DIAGPAP  08/25/2021 0848    - Negative for intraepithelial lesion or malignancy (NILM)   DIAGPAP  12/28/2017 0000    NEGATIVE FOR INTRAEPITHELIAL LESIONS OR MALIGNANCY.   HPVHIGH Negative 08/25/2021 0848   ADEQPAP  08/25/2021 0848    Satisfactory for evaluation; transformation zone component PRESENT.   ADEQPAP  12/28/2017 0000    Satisfactory for evaluation  endocervical/transformation zone component PRESENT.    PCP is [employer] PA.   Current Medications, Allergies, Past Medical History, Past Surgical History, Family History and Social History were reviewed in [name] record.     Review of Systems: Patient denies any headaches, hearing loss, fatigue, blurred vision, shortness of breath, chest pain, abdominal pain, problems with bowel movements, urination, or intercourse. No joint pain or mood swings.  See HPI for positives   Physical Exam:BP 132/80 (BP Location: Left Arm, Patient Position: Sitting, Cuff Size: Normal)   Pulse 90   Ht 5' (1.524 m)   Wt 168 lb (76.2 kg)   LMP 10/10/2023   BMI 32.81 kg/m   General:  Well developed, well nourished, no acute distress Skin:  Warm and dry Neck:  Midline trachea, normal thyroid, good ROM, no lymphadenopathy Lungs; Clear to auscultation bilaterally Breast:  No dominant palpable mass, retraction, or nipple discharge Cardiovascular: Regular rate and rhythm Abdomen:  Soft, non tender, no hepatosplenomegaly Pelvic:  External genitalia is normal in appearance, no lesions.  The vagina is normal in appearance,+period blood. Urethra has no lesions or masses. The cervix is bulbous.  Uterus is felt to be normal size, shape, and  contour.  No adnexal masses or tenderness noted.Bladder is non tender, no masses felt. Rectal: deferred  Extremities/musculoskeletal:  No swelling or varicosities noted, no clubbing or cyanosis Psych:  No mood changes, alert and cooperative,seems happy AA is 0 Fall risk is low    10/11/2023    8:26 AM 12/21/2022    1:31 PM 08/25/2021    8:50 AM  Depression screen PHQ 2/9  Decreased Interest 0  0  Down, Depressed, Hopeless 0  0  PHQ - 2 Score 0  0  Altered sleeping 0  0  Tired, decreased energy 2  1  Change in appetite 1  1  Feeling bad or failure about yourself  0  0  Trouble concentrating 0  0  Moving slowly or fidgety/restless 0  0  Suicidal thoughts 0  0  PHQ-9 Score 3  2     Information is confidential and restricted. Go to Review Flowsheets to unlock data.   She is on meds     10/11/2023    8:26 AM 12/21/2022    1:33 PM 08/25/2021    8:50 AM  GAD 7 : Generalized Anxiety Score  Nervous, Anxious, on Edge 2  1  Control/stop worrying 1  1  Worry too much - different things 2  1  Trouble relaxing 3  2  Restless 1  2  Easily annoyed or irritable 1  1  Afraid - awful might happen 0  1  Total GAD 7 Score 10  9  Anxiety Difficulty        Information  is confidential and restricted. Go to Review Flowsheets to unlock data.      Upstream - 10/11/23 0826       Pregnancy Intention Screening   Does the patient want to become pregnant in the next year? No    Does the patient's partner want to become pregnant in the next year? No    Would the patient like to discuss contraceptive options today? No      Contraception Wrap Up   Current Method Vasectomy    End Method Vasectomy    Contraception Counseling Provided No            Examination chaperoned by Faith Rogue LPN   Impression and plan: 1. Encounter for well woman exam with routine gynecological exam Pap and physical in 1 year Mammogram scheduled for 10/23/23, was negative 10/17/22 Colonoscopy per GI Labs with  PCP Stay active  2. Perimenopause Discussed symptoms of perimenopause

## 2023-10-12 ENCOUNTER — Telehealth: Payer: Self-pay | Admitting: Physician Assistant

## 2023-10-12 NOTE — Telephone Encounter (Signed)
Please call [facility] back to give DEA for Dr. Jennelle Human for the [name] 20mg  RX on file. Please call  [phone] option #2  and use Ref# [id number]

## 2023-10-12 NOTE — Telephone Encounter (Signed)
[facility] and [facility].

## 2023-10-23 ENCOUNTER — Ambulatory Visit (HOSPITAL_COMMUNITY)
Admission: RE | Admit: 2023-10-23 | Discharge: 2023-10-23 | Disposition: A | Discharge status: Home / Self Care | Payer: BC Managed Care – PPO | Source: Ambulatory Visit | Attending: Adult Health | Admitting: Adult Health

## 2023-10-23 DIAGNOSIS — Z1231 Encounter for screening mammogram for malignant neoplasm of breast: Secondary | ICD-10-CM | POA: Insufficient documentation

## 2023-11-07 ENCOUNTER — Other Ambulatory Visit: Payer: Self-pay | Admitting: Neurology

## 2023-11-07 MED ORDER — XYWAV 500 MG/ML PO SOLN: ORAL | 5 refills | Status: DC

## 2023-11-27 ENCOUNTER — Telehealth: Payer: Self-pay

## 2023-11-27 NOTE — Telephone Encounter (Signed)
 Prior approval received effective with [facility] through 11/26/2024 for Austedo XR 24 mg

## 2023-11-27 NOTE — Telephone Encounter (Signed)
 Prior Authorization submitted for Austedo XR 24 mg #[id number] with [employer], renewal pending

## 2023-11-30 ENCOUNTER — Ambulatory Visit: Payer: 59 | Admitting: Physician Assistant

## 2023-11-30 ENCOUNTER — Encounter: Payer: Self-pay | Admitting: Physician Assistant

## 2023-11-30 DIAGNOSIS — G2401 Drug induced subacute dyskinesia: Secondary | ICD-10-CM | POA: Diagnosis not present

## 2023-11-30 DIAGNOSIS — F329 Major depressive disorder, single episode, unspecified: Secondary | ICD-10-CM

## 2023-11-30 DIAGNOSIS — F900 Attention-deficit hyperactivity disorder, predominantly inattentive type: Secondary | ICD-10-CM

## 2023-11-30 DIAGNOSIS — Z1589 Genetic susceptibility to other disease: Secondary | ICD-10-CM

## 2023-11-30 DIAGNOSIS — G4711 Idiopathic hypersomnia with long sleep time: Secondary | ICD-10-CM

## 2023-11-30 MED ORDER — AMPHETAMINE-DEXTROAMPHETAMINE 20 MG PO TABS: 20.0000 mg | ORAL_TABLET | Freq: Three times a day (TID) | ORAL | 0 refills | Status: DC

## 2023-11-30 NOTE — Progress Notes (Signed)
 [facility]  Patient ID: Lauren [name],  MRN: [id number]  PCP: Alys Schuyler HERO, PA  Date of Evaluation: 11/30/2023 Time spent:30 minutes  Chief Complaint:  Chief Complaint   Depression; ADD; Follow-up    HISTORY/CURRENT STATUS: HPI For routine f/u  She increased Effexor  6 weeks ago. Very tired but doesn't feel like she depressed. May be thyroid . Her Synthroid  dose has been changed. The [name] used to give her more energy and help her stay on task, but it hasn't been for the past month or so. Doesn't have the energy or motivation to do much.Some of it may be d/t the holidays though.  Not missing work and it is going well.   No extreme sadness, tearfulness, or feelings of hopelessness.  Sleeps a lot. ADLs and personal hygiene are normal.  Appetite has not changed.  Weight is stable. No c/o anxiety.  Denies suicidal or homicidal thoughts.  Reports she is sucking on her teeth again and also has the abnormal, involuntary, toe movements again. The Austedo  has helped those sx for several years until recently. Not sure why it's not working as well as it did. No other abnl movements.  Patient denies increased energy with decreased need for sleep, increased talkativeness, racing thoughts, impulsivity or risky behaviors, increased spending, increased libido, grandiosity, increased irritability or anger, paranoia, or hallucinations.  Denies dizziness, syncope, seizures, numbness, tingling, tremor, unsteady gait, slurred speech, confusion. Denies muscle or joint pain, stiffness, or dystonia. Denies unexplained weight loss, frequent infections, or sores that heal slowly.  No polyphagia, polydipsia, or polyuria. Denies visual changes or paresthesias.   Individual Medical History/ Review of Systems: Changes? :No   Past medications for mental health diagnoses include: Spravato for about 3 months (made her worse, not better)  took it 1-2 times a week.  Effexor , Lithium , [name],  Xanax , elavil , and 'everything that's out there.' She can't name the meds.  Pristiq ,  Prozac , lamictal  caused rash, Latuda , Lithium , [name] , [name] ,Trazodone , Austedo , Ingrezza didn't help.   Allergies: Lamictal  [lamotrigine ], Doxycycline, Effexor  [venlafaxine ], Savella [milnacipran hcl], and Prilosec [omeprazole ]  Current Medications:  Current Outpatient Medications:    Ca, Mg, K, and Na Oxybates (XYWAV ) 500 MG/ML SOLN, Take 4.5 G twice nightly, Disp: 540 mL, Rfl: 5   Cholecalciferol (VITAMIN D3) 125 MCG (5000 UT) CAPS, Take 1 capsule by mouth daily., Disp: , Rfl:    FOLIC ACID  PO, Take by mouth., Disp: , Rfl:    Iron-Vitamin C 65-125 MG TABS, Take by mouth daily., Disp: , Rfl:    latanoprost (XALATAN) 0.005 % ophthalmic solution, 1 drop at bedtime., Disp: , Rfl:    levothyroxine  (SYNTHROID ) 125 MCG tablet, Take 100 mcg by mouth every morning., Disp: , Rfl:    rosuvastatin  (CRESTOR ) 5 MG tablet, Take  1 tablet  Daily  for Cholesterol (Patient taking differently: Takes 1 tab on Monday, Wednesday and Friday), Disp: 90 tablet, Rfl: 3   UNABLE TO FIND, BiPAP, Disp: , Rfl:    venlafaxine  XR (EFFEXOR -XR) 75 MG 24 hr capsule, Take 2 capsules (150 mg total) by mouth every evening. (Patient taking differently: Take 112.5 mg by mouth every evening.), Disp: 270 capsule, Rfl: 1   vitamin E 100 UNIT capsule, Take by mouth daily. Takes 180mg , Disp: , Rfl:    amphetamine -dextroamphetamine  ([name]) 20 MG tablet, Take 1 tablet (20 mg total) by mouth 3 (three) times daily., Disp: 90 tablet, Rfl: 0   AUSTEDO  XR 24 MG TB24, Take 48 mg by mouth daily., Disp: ,  Rfl:    dicyclomine  (BENTYL ) 20 MG tablet, Take 20 mg by mouth 3 (three) times daily. (Patient not taking: Reported on 11/30/2023), Disp: , Rfl:  Medication Side Effects: none  Family Medical/ Social History: Changes? None.  MENTAL HEALTH EXAM:  There were no vitals taken for this visit.There is no height or weight on file to calculate BMI.  General  Appearance: Casual and Well Groomed  Eye Contact:  Good  Speech:  Clear and Coherent and Normal Rate  Volume:  Normal  Mood:  Euthymic  Affect:  Congruent  Thought Process:  Goal Directed and Descriptions of Associations: Circumstantial  Orientation:  Full (Time, Place, and Person)  Thought Content: Logical   Suicidal Thoughts:  No  Homicidal Thoughts:  No  Memory:  WNL  Judgement:  Good  Insight:  Good  Psychomotor Activity:   often purses her lips. No other abnormal movements   Concentration:  Concentration: Good and Attention Span: Fair  Recall:  Good  Fund of Knowledge: Good  Language: Good  Assets:  Communication Skills Desire for Improvement Financial Resources/Insurance Housing Resilience Transportation Vocational/Educational  ADL's:  Intact  Cognition: WNL  Prognosis:  Good   See GeneSight results  DIAGNOSES:    ICD-10-CM   1. Treatment-resistant depression  F32.9     2. Tardive dyskinesia  G24.01     3. Attention deficit hyperactivity disorder (ADHD), predominantly inattentive type  F90.0     4. 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  [name] filled 11/10/2023.  Xywav  filled 11/27/2023. I provided 30 minutes of face to face time during this encounter, including time spent before and after the visit in records review, medical decision making, counseling pertinent to today's visit, and charting.   Discussed the fatigue, her sleep disorder, lack of focus. I recommend increasing the [name].   For the movements, she would like to wait on changing any meds.  Thinks some of the sx worsening could be related to the stress of holidays.   Increase [name] 20 mg, to 1 p.o. tid. If she feels like she doesn't need the afternoon dose(s) she can skip them.  Continue Austedo  XR 24 mg, 2 qd.          Continue Effexor  XR 112.5 mg daily. Continue Bipap machine. Continue  therapy. Return in 4-6 weeks.    Verneita Cooks, PA-C

## 2023-12-03 MED ORDER — AUSTEDO XR 24 MG PO TB24: 48.0000 mg | ORAL_TABLET | Freq: Every day | ORAL | Status: DC

## 2024-01-10 ENCOUNTER — Other Ambulatory Visit: Payer: Self-pay | Admitting: Physician Assistant

## 2024-01-16 ENCOUNTER — Encounter: Payer: Self-pay | Admitting: Physician Assistant

## 2024-01-16 ENCOUNTER — Ambulatory Visit: Payer: 59 | Admitting: Physician Assistant

## 2024-01-16 DIAGNOSIS — G2401 Drug induced subacute dyskinesia: Secondary | ICD-10-CM | POA: Diagnosis not present

## 2024-01-16 DIAGNOSIS — G4711 Idiopathic hypersomnia with long sleep time: Secondary | ICD-10-CM

## 2024-01-16 DIAGNOSIS — F329 Major depressive disorder, single episode, unspecified: Secondary | ICD-10-CM | POA: Diagnosis not present

## 2024-01-16 DIAGNOSIS — F900 Attention-deficit hyperactivity disorder, predominantly inattentive type: Secondary | ICD-10-CM

## 2024-01-16 MED ORDER — VENLAFAXINE HCL ER 37.5 MG PO CP24: 37.5000 mg | ORAL_CAPSULE | Freq: Every day | ORAL | 1 refills | Status: DC

## 2024-01-16 MED ORDER — AMPHETAMINE-DEXTROAMPHETAMINE 20 MG PO TABS: 20.0000 mg | ORAL_TABLET | Freq: Three times a day (TID) | ORAL | 0 refills | Status: DC

## 2024-01-16 MED ORDER — VENLAFAXINE HCL ER 75 MG PO CP24: 75.0000 mg | ORAL_CAPSULE | Freq: Every evening | ORAL | 1 refills | Status: AC

## 2024-01-16 NOTE — Progress Notes (Signed)
 [facility]  Patient ID: Lauren [name],  MRN: [id number]  PCP: Delma Officer, PA  Date of Evaluation: 01/16/2024 Time spent:20 minutes  Chief Complaint:  Chief Complaint   Depression; ADD; Follow-up    HISTORY/CURRENT STATUS: HPI For routine f/u  At the last OV, we increased the [name].  It has been helpful. On the weekends, she sometimes doesn't take the afternoon doses. When she doesn't take it, she's very sleepy.   Feels pretty good right now. She's able to enjoy things.  Energy and motivation are good.  Work is going well.   No extreme sadness, tearfulness, or feelings of hopelessness.  Sleeps well most of the time. ADLs and personal hygiene are normal.  Appetite has not changed.  Weight is stable. No c/o anxiety.  Denies suicidal or homicidal thoughts.  Patient denies increased energy with decreased need for sleep, increased talkativeness, racing thoughts, impulsivity or risky behaviors, increased spending, increased libido, grandiosity, increased irritability or anger, paranoia, or hallucinations.  Denies dizziness, syncope, seizures, numbness, tingling, tremor, unsteady gait, slurred speech, confusion. Denies muscle or joint pain, stiffness, or dystonia. Denies unexplained weight loss, frequent infections, or sores that heal slowly.  No polyphagia, polydipsia, or polyuria. Denies visual changes or paresthesias.   Individual Medical History/ Review of Systems: Changes? :No   Past medications for mental health diagnoses include: Spravato for about 3 months (made her worse, not better)  took it 1-2 times a week.  Effexor, Lithium, [name], Xanax, elavil, and 'everything that's out there.' She can't name the meds.  Pristiq,  Prozac, lamictal caused rash, Latuda, Lithium, [name], [name],Trazodone, Austedo, Ingrezza didn't help.   Allergies: Lamictal [lamotrigine], Doxycycline, Effexor [venlafaxine], Savella [milnacipran hcl], and Prilosec  [omeprazole]  Current Medications:  Current Outpatient Medications:    [START ON 02/12/2024] amphetamine-dextroamphetamine ([name]) 20 MG tablet, Take 1 tablet (20 mg total) by mouth 3 (three) times daily., Disp: 90 tablet, Rfl: 0   amphetamine-dextroamphetamine ([name]) 20 MG tablet, Take 1 tablet (20 mg total) by mouth 3 (three) times daily., Disp: 90 tablet, Rfl: 0   AUSTEDO XR 24 MG TB24, TAKE 2 TABLETS BY MOUTH 1 TIME A DAY, Disp: 180 tablet, Rfl: 1   Ca, Mg, K, and Na Oxybates (XYWAV) 500 MG/ML SOLN, Take 4.5 G twice nightly, Disp: 540 mL, Rfl: 5   Cholecalciferol (VITAMIN D3) 125 MCG (5000 UT) CAPS, Take 1 capsule by mouth daily., Disp: , Rfl:    FOLIC ACID PO, Take by mouth., Disp: , Rfl:    Iron-Vitamin C 65-125 MG TABS, Take by mouth daily., Disp: , Rfl:    latanoprost (XALATAN) 0.005 % ophthalmic solution, 1 drop at bedtime., Disp: , Rfl:    levothyroxine (SYNTHROID) 125 MCG tablet, Take 100 mcg by mouth every morning., Disp: , Rfl:    rosuvastatin (CRESTOR) 5 MG tablet, Take  1 tablet  Daily  for Cholesterol (Patient taking differently: Takes 1 tab on Monday, Wednesday and Friday), Disp: 90 tablet, Rfl: 3   UNABLE TO FIND, BiPAP, Disp: , Rfl:    venlafaxine XR (EFFEXOR-XR) 37.5 MG 24 hr capsule, Take 1 capsule (37.5 mg total) by mouth daily with breakfast. With 75 mg=112.5 mg daily, Disp: 90 capsule, Rfl: 1   vitamin E 100 UNIT capsule, Take by mouth daily. Takes 180mg , Disp: , Rfl:    amphetamine-dextroamphetamine ([name]) 20 MG tablet, Take 1 tablet (20 mg total) by mouth 3 (three) times daily., Disp: 90 tablet, Rfl: 0   dicyclomine (BENTYL) 20 MG  tablet, Take 20 mg by mouth 3 (three) times daily. (Patient not taking: Reported on 09/26/2023), Disp: , Rfl:    venlafaxine XR (EFFEXOR-XR) 75 MG 24 hr capsule, Take 1 capsule (75 mg total) by mouth every evening. Take with 37.5 mg= 112.5 mg daily., Disp: 90 capsule, Rfl: 1 Medication Side Effects: none  Family Medical/ Social  History: Changes? None.  MENTAL HEALTH EXAM:  There were no vitals taken for this visit.There is no height or weight on file to calculate BMI.  General Appearance: Casual and Well Groomed  Eye Contact:  Good  Speech:  Clear and Coherent and Normal Rate  Volume:  Normal  Mood:  Euthymic  Affect:  Congruent  Thought Process:  Goal Directed and Descriptions of Associations: Circumstantial  Orientation:  Full (Time, Place, and Person)  Thought Content: Logical   Suicidal Thoughts:  No  Homicidal Thoughts:  No  Memory:  WNL  Judgement:  Good  Insight:  Good  Psychomotor Activity:   No abnl movements  Concentration:  Concentration: Good and Attention Span: Good  Recall:  Good  Fund of Knowledge: Good  Language: Good  Assets:  Communication Skills Desire for Improvement Financial Resources/Insurance Housing Resilience Transportation Vocational/Educational  ADL's:  Intact  Cognition: WNL  Prognosis:  Good   See GeneSight results on chart  AIMS    Flowsheet Row Office Visit from 01/16/2024 in [facility]  AIMS Total Score 2      GAD-7    Flowsheet Row [facility] Visit [facility]11/2023 in [facility] Office Visit from 12/21/2022 in [facility] Office Visit from 08/25/2021 in [facility]  Total GAD-7 Score 10 17 9       PHQ2-9    Flowsheet Row Office Visit from 10/11/2023 in [facility] Office Visit from 12/21/2022 in [facility] Office Visit from 08/25/2021 in [facility] Office Visit from 03/05/2019 in [facility] Office Visit from 12/31/2018 in [facility]  PHQ-2 Total Score 0 1 0 2 0  PHQ-9 Total Score 3 -- 2 6 --      Flowsheet Row ED from 05/15/2021 in [facility] Department at [facility]  C-SSRS RISK CATEGORY No Risk        DIAGNOSES:    ICD-10-CM   1. Treatment-resistant depression  F32.9     2. Attention deficit hyperactivity disorder (ADHD), predominantly inattentive type  F90.0     3. Tardive dyskinesia  G24.01     4. 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  [name] filled 11/30/2023.  Xywav filled 12/27/2023. I provided 20 minutes of face to face time during this encounter, including time spent before and after the visit in records review, medical decision making, counseling pertinent to today's visit, and charting.   She is doing well with current meds so no changes are needed.  Continue [name] 20 mg,1 p.o. tid. If she feels like she doesn't need the afternoon dose(s) she can skip them.  Continue Austedo XR 24 mg, 2 qd.          Continue Effexor XR 112.5 mg daily. Continue Bipap machine. Continue therapy. Return in 3 months.    Melony Overly, PA-C

## 2024-01-17 ENCOUNTER — Telehealth: Payer: Self-pay

## 2024-01-17 NOTE — Telephone Encounter (Addendum)
 Prior Authorization Amphetamine-Dextroamphetamine 20MG  tablets #90/30 [facility]  Approved Effective:  01/18/2024 - 01/17/2027

## 2024-02-05 ENCOUNTER — Telehealth: Payer: Self-pay | Admitting: Neurology

## 2024-02-05 NOTE — Telephone Encounter (Signed)
 PA completed through CMM/ [employer] KEY:[id number] Will await determination

## 2024-02-06 NOTE — Telephone Encounter (Signed)
 PA approved for the pt from 02/06/2024-02/05/2025 through [facility] plan.

## 2024-02-06 NOTE — Telephone Encounter (Signed)
 Received a fax stating additional information is needed to determine continued coverage. I have sent updated ov notes, and old SS and prior approval for the patient to [facility] via fax. Will await determination

## 2024-02-16 ENCOUNTER — Encounter: Payer: Self-pay | Admitting: Podiatry

## 2024-02-16 ENCOUNTER — Ambulatory Visit: Payer: Self-pay | Admitting: Podiatry

## 2024-02-16 DIAGNOSIS — L84 Corns and callosities: Secondary | ICD-10-CM | POA: Diagnosis not present

## 2024-02-16 DIAGNOSIS — L600 Ingrowing nail: Secondary | ICD-10-CM

## 2024-02-16 NOTE — Patient Instructions (Signed)

## 2024-02-16 NOTE — Progress Notes (Signed)
 Subjective:  Patient ID: Lauren [name], female    DOB: 02-Jun-1981,   MRN: [id number]  No chief complaint on file.   43 y.o. female presents for new concern of right ingrown toenail that has been present for a while. Relates the other side is now painful but does still get occasional pain on the lateral border. Also relates concern for right pinky toe as well. Has had ingrown procedure on her right lateral border in the past.  Denies any other pedal complaints. Denies n/v/f/c.   Past Medical History:  Diagnosis Date   Anemia    Anxiety    Constipation    Depression    Fibromyalgia    GERD (gastroesophageal reflux disease)    Glaucoma    Hemorrhoids    HSV-1 infection    Hypertension    Hypothyroidism    Idiopathic hypersomnia    Idiopathic hypersomnia    OSA on CPAP    Other abnormal glucose    Preeclampsia     Objective:  Physical Exam: Vascular: DP/PT pulses 2/4 bilateral. CFT <3 seconds. Normal hair growth on digits. No edema.  Skin. No lacerations or abrasions bilateral feet.  Incurvation of bilateral borders of right great toe with tenderness to palpation. No erythema edema or purulence noted.  Hypekeratotic cored lesion noted to lateral nail border of right fifth digit. Tender to palpation.  Musculoskeletal: MMT 5/5 bilateral lower extremities in DF, PF, Inversion and Eversion. Deceased ROM in DF of ankle joint.  Neurological: Sensation intact to light touch.   Assessment:   1. Ingrown right greater toenail      Plan:  Patient was evaluated and treated and all questions answered. .Discussed ingrown toenails etiology and treatment options including procedure for removal vs conservative care.  -Discussed fifth toe corn and debrided as courtesy today and toe caps dispensed.  Patient requesting removal of ingrown nail today. Procedure below.  Discussed procedure and post procedure care and patient expressed understanding.  Will follow-up in 2 weeks for nail  check or sooner if any problems arise.    Procedure:  Procedure: partial Nail Avulsion of right hallux bilateral nail border.  Surgeon: Louann Sjogren, DPM  Pre-op Dx: Ingrown toenail without infection Post-op: Same  Place of Surgery: Office exam room.  Indications for surgery: Painful and ingrown toenail.    The patient is requesting removal of nail with  chemical matrixectomy. Risks and complications were discussed with the patient for which they understand and written consent was obtained. Under sterile conditions a total of 3 mL of  1% lidocaine plain was infiltrated in a hallux block fashion. Once anesthetized, the skin was prepped in sterile fashion. A tourniquet was then applied. Next the bilateral aspect of hallux nail border was then sharply excised making sure to remove the entire offending nail border.  Next phenol was then applied under standard conditions to permanently destroy the matrix and copiously irrigated. Silvadene was applied. A dry sterile dressing was applied. After application of the dressing the tourniquet was removed and there is found to be an immediate capillary refill time to the digit. The patient tolerated the procedure well without any complications. Post procedure instructions were discussed the patient for which he verbally understood. Follow-up in two weeks for nail check or sooner if any problems are to arise. Discussed signs/symptoms of infection and directed to call the office immediately should any occur or go directly to the emergency room. In the meantime, encouraged to call the office with any  questions, concerns, changes symptoms.   Louann Sjogren, DPM

## 2024-02-20 ENCOUNTER — Ambulatory Visit: Payer: Self-pay | Admitting: Podiatry

## 2024-03-01 ENCOUNTER — Ambulatory Visit: Admitting: Podiatry

## 2024-03-04 ENCOUNTER — Encounter: Payer: Self-pay | Admitting: Podiatry

## 2024-03-04 ENCOUNTER — Ambulatory Visit: Admitting: Podiatry

## 2024-03-04 DIAGNOSIS — L600 Ingrowing nail: Secondary | ICD-10-CM

## 2024-03-04 NOTE — Progress Notes (Signed)
  Subjective:  Patient ID: Lauren [name], female    DOB: January 21, 1981,   MRN: [id number]  No chief complaint on file.   43 y.o. female presents for follow-up of ingrown nail procedure. Relates it is getting better but did struggle a lot with pain and issues with the nail. Has been soaking . Denies any other pedal complaints. Denies n/v/f/c.   Past Medical History:  Diagnosis Date   Anemia    Anxiety    Constipation    Depression    Fibromyalgia    GERD (gastroesophageal reflux disease)    Glaucoma    Hemorrhoids    HSV-1 infection    Hypertension    Hypothyroidism    Idiopathic hypersomnia    Idiopathic hypersomnia    OSA on CPAP    Other abnormal glucose    Preeclampsia     Objective:  Physical Exam: Vascular: DP/PT pulses 2/4 bilateral. CFT <3 seconds. Normal hair growth on digits. No edema.  Skin. No lacerations or abrasions bilateral feet. Right hallux nail healing well. Still tender to distal tip.  Musculoskeletal: MMT 5/5 bilateral lower extremities in DF, PF, Inversion and Eversion. Deceased ROM in DF of ankle joint.  Neurological: Sensation intact to light touch.   Assessment:   1. Ingrown right greater toenail      Plan:  Patient was evaluated and treated and all questions answered. Toe was evaluated and appears to be healing well.  May discontinue soaks and neosporin.  Patient to follow-up as needed.    Jennefer Moats, DPM

## 2024-03-25 ENCOUNTER — Ambulatory Visit: Payer: Self-pay

## 2024-04-08 ENCOUNTER — Telehealth: Payer: Self-pay | Admitting: Physician Assistant

## 2024-04-08 ENCOUNTER — Other Ambulatory Visit: Payer: Self-pay

## 2024-04-08 NOTE — Telephone Encounter (Signed)
 Pt called asking for a refill on her adderall 20 mg. Pharmacy is [facility]. Next appt 5/29

## 2024-04-08 NOTE — Telephone Encounter (Signed)
 Pended one RF of [name] 20 mg, #90, to [facility].

## 2024-04-09 MED ORDER — AMPHETAMINE-DEXTROAMPHETAMINE 20 MG PO TABS: 20.0000 mg | ORAL_TABLET | Freq: Three times a day (TID) | ORAL | 0 refills | Status: DC

## 2024-04-10 ENCOUNTER — Other Ambulatory Visit: Payer: Self-pay | Admitting: Neurology

## 2024-04-10 MED ORDER — XYWAV 500 MG/ML PO SOLN: ORAL | 5 refills | Status: AC

## 2024-04-18 ENCOUNTER — Encounter: Payer: Self-pay | Admitting: Physician Assistant

## 2024-04-18 ENCOUNTER — Ambulatory Visit: Payer: 59 | Admitting: Physician Assistant

## 2024-04-18 DIAGNOSIS — F900 Attention-deficit hyperactivity disorder, predominantly inattentive type: Secondary | ICD-10-CM | POA: Diagnosis not present

## 2024-04-18 DIAGNOSIS — F329 Major depressive disorder, single episode, unspecified: Secondary | ICD-10-CM | POA: Diagnosis not present

## 2024-04-18 DIAGNOSIS — G4711 Idiopathic hypersomnia with long sleep time: Secondary | ICD-10-CM

## 2024-04-18 DIAGNOSIS — Z1589 Genetic susceptibility to other disease: Secondary | ICD-10-CM

## 2024-04-18 DIAGNOSIS — G2401 Drug induced subacute dyskinesia: Secondary | ICD-10-CM | POA: Diagnosis not present

## 2024-04-18 MED ORDER — AMPHETAMINE-DEXTROAMPHETAMINE 20 MG PO TABS: 20.0000 mg | ORAL_TABLET | Freq: Three times a day (TID) | ORAL | 0 refills | Status: DC

## 2024-04-18 NOTE — Progress Notes (Signed)
 [facility]  Patient ID: Lauren [name],  MRN: [id number]  PCP: Karalee Oscar, PA  Date of Evaluation: 04/18/2024 Time spent:20 minutes  Chief Complaint:  Chief Complaint   Depression; ADD    HISTORY/CURRENT STATUS: HPI For routine f/u  Had a couple of episodes of 'brain zaps' since the LOV.  She is very daily judgment about taking her medications daily and on time so she is not sure why that happened.  As far as she knows the generic manufacturer has not changed  Patient is able to enjoy things.  Energy and motivation are good.  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   No extreme sadness, tearfulness, or feelings of hopelessness.  Sleeps well most of the time, uses BiPAP, sees Dr Albertina Hugger. ADLs and personal hygiene are normal.  Appetite has not changed.  Weight is stable.  No complaints of anxiety.  Denies suicidal or homicidal thoughts.  Patient denies increased energy with decreased need for sleep, increased talkativeness, racing thoughts, impulsivity or risky behaviors, increased spending, increased libido, grandiosity, increased irritability or anger, paranoia, or hallucinations.  Has known tardive dyskinesia treated well with Austedo . Denies dizziness, syncope, seizures, numbness, tingling, tremor, tics, unsteady gait, slurred speech, confusion. Denies muscle or joint pain, stiffness, or dystonia.  Individual Medical History/ Review of Systems: Changes? :No   Past medications for mental health diagnoses include: Spravato for about 3 months (made her worse, not better)  took it 1-2 times a week.  Effexor , Lithium , [name], Xanax , elavil , and 'everything that's out there.' She can't name the meds.  Pristiq ,  Prozac , lamictal  caused rash, Latuda , Lithium , [name] , [name] ,Trazodone , Austedo , Ingrezza didn't help.   Allergies: Lamictal  Ande.Baldy ], Doxycycline, Effexor   [venlafaxine ], Savella [milnacipran hcl], and Prilosec [omeprazole ]  Current Medications:  Current Outpatient Medications:    AUSTEDO  XR 24 MG TB24, TAKE 2 TABLETS BY MOUTH 1 TIME A DAY, Disp: 180 tablet, Rfl: 1   Ca, Mg, K, and Na Oxybates (XYWAV ) 500 MG/ML SOLN, Take 4.5 G twice nightly, Disp: 540 mL, Rfl: 5   Cholecalciferol (VITAMIN D3) 125 MCG (5000 UT) CAPS, Take 1 capsule by mouth daily., Disp: , Rfl:    FOLIC ACID  PO, Take by mouth., Disp: , Rfl:    Iron-Vitamin C 65-125 MG TABS, Take by mouth daily., Disp: , Rfl:    latanoprost (XALATAN) 0.005 % ophthalmic solution, 1 drop at bedtime., Disp: , Rfl:    levothyroxine  (SYNTHROID ) 125 MCG tablet, Take 100 mcg by mouth every morning., Disp: , Rfl:    UNABLE TO FIND, BiPAP, Disp: , Rfl:    venlafaxine  XR (EFFEXOR -XR) 37.5 MG 24 hr capsule, Take 1 capsule (37.5 mg total) by mouth daily with breakfast. With 75 mg=112.5 mg daily, Disp: 90 capsule, Rfl: 1   venlafaxine  XR (EFFEXOR -XR) 75 MG 24 hr capsule, Take 1 capsule (75 mg total) by mouth every evening. Take with 37.5 mg= 112.5 mg daily., Disp: 90 capsule, Rfl: 1   vitamin E 100 UNIT capsule, Take by mouth daily. Takes 180mg , Disp: , Rfl:    [START ON 05/10/2024] amphetamine -dextroamphetamine  ([name]) 20 MG tablet, Take 1 tablet (20 mg total) by mouth 3 (three) times daily., Disp: 90 tablet, Rfl: 0   [START ON 06/08/2024] amphetamine -dextroamphetamine  ([name]) 20 MG tablet, Take 1 tablet (20 mg total) by mouth 3 (three) times daily., Disp: 90 tablet, Rfl: 0   [START ON 07/08/2024]  amphetamine -dextroamphetamine  ([name]) 20 MG tablet, Take 1 tablet (20 mg total) by mouth 3 (three) times daily., Disp: 90 tablet, Rfl: 0   dicyclomine  (BENTYL ) 20 MG tablet, Take 20 mg by mouth 3 (three) times daily. (Patient not taking: Reported on 09/26/2023), Disp: , Rfl:    rosuvastatin  (CRESTOR ) 5 MG tablet, Take  1 tablet  Daily  for Cholesterol (Patient taking differently: Takes 1 tab on Monday, Wednesday  and Friday), Disp: 90 tablet, Rfl: 3 Medication Side Effects: none  Family Medical/ Social History: Changes? None.  MENTAL HEALTH EXAM:  There were no vitals taken for this visit.There is no height or weight on file to calculate BMI.  General Appearance: Casual and Well Groomed  Eye Contact:  Good  Speech:  Clear and Coherent and Normal Rate  Volume:  Normal  Mood:  Euthymic  Affect:  Congruent  Thought Process:  Goal Directed and Descriptions of Associations: Circumstantial  Orientation:  Full (Time, Place, and Person)  Thought Content: Logical   Suicidal Thoughts:  No  Homicidal Thoughts:  No  Memory:  WNL  Judgement:  Good  Insight:  Good  Psychomotor Activity:  No abnl movements  Concentration:  Concentration: Good and Attention Span: Good  Recall:  Good  Fund of Knowledge: Good  Language: Good  Assets:  Communication Skills Desire for Improvement Financial Resources/Insurance Housing Resilience Social Support Transportation Vocational/Educational  ADL's:  Intact  Cognition: WNL  Prognosis:  Good   See GeneSight results on chart  AIMS    Flowsheet Row Office Visit from [facility]/16/2024 in [facility]  AIMS Total Score 2      GAD-7    Flowsheet Row Office Visit from 10/11/2023 in [facility] Office Visit from 12/21/2022 in [facility] Office Visit from 08/25/2021 in [facility]  Total GAD-7 Score 10 17 9       PHQ2-9    Flowsheet Row Office Visit from 10/11/2023 in [facility] Office Visit from 12/21/2022 in [facility] Office Visit from 08/25/2021 in [facility] for [facility] Office Visit from 03/05/2019 in [facility] for [facility] Office Visit from 12/31/2018 in [facility]  PHQ-2  Total Score 0 1 0 2 0  PHQ-9 Total Score 3 -- 2 6 --      Flowsheet Row ED from 05/15/2021 in [facility] at [facility]  C-SSRS RISK CATEGORY No Risk      DIAGNOSES:    ICD-10-CM   1. Treatment-resistant depression  F32.9     2. Attention deficit hyperactivity disorder (ADHD), predominantly inattentive type  F90.0     3. 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  [name] filled 04/10/2024.  Xywav  filled 03/26/2024. I provided 20 minutes of face to face time during this encounter, including time spent before and after the visit in records review, medical decision making, counseling pertinent to today's visit, and charting.   We discussed to the "brain zaps."  We agree for her to observe for now.  She is aware that even taking the Effexor  an hour later than she normally does can cause brain  zaps in some people.  She asked about decreasing the Effexor  some but she feels good now and does not really want to chance having problems with medication withdrawal over the summer.  She asked my opinion, I recommend she stay on the current dose.  She agrees.  Continue [name] 20 mg,1 p.o. tid.  (Or 1.5 p.o. twice daily.)  continue Austedo  XR 24 mg, 2 qd.          Continue Effexor  XR 112.5 mg daily. Continue Bipap machine. Continue therapy. Return in 3 months.    Marvia Slocumb, PA-C

## 2024-05-07 ENCOUNTER — Other Ambulatory Visit: Payer: Self-pay

## 2024-05-07 ENCOUNTER — Telehealth: Payer: Self-pay | Admitting: Physician Assistant

## 2024-05-07 NOTE — Telephone Encounter (Signed)
 Confirm pharmacy. Message says [facility], but has been getting from [facility].

## 2024-05-07 NOTE — Telephone Encounter (Signed)
 Pt LVM @ 9:45a requesting the refill of [name] 20mg  for 3 times a day to [facility].  She said [facility] pharmacy can't promise they will have it in stock on Friday.  She thinks she has enough to get it from the [facility] pharmacy.  She asked for a call back when it's sent

## 2024-05-08 ENCOUNTER — Other Ambulatory Visit: Payer: Self-pay

## 2024-05-08 MED ORDER — AMPHETAMINE-DEXTROAMPHETAMINE 20 MG PO TABS: 20.0000 mg | ORAL_TABLET | Freq: Three times a day (TID) | ORAL | 0 refills | Status: DC

## 2024-05-08 NOTE — Telephone Encounter (Signed)
 Pended [name] to [facility], canceled at [facility].

## 2024-05-08 NOTE — Telephone Encounter (Signed)
 Confirmed that it should be [facility] and not [facility].

## 2024-05-13 ENCOUNTER — Telehealth: Payer: Self-pay | Admitting: Physician Assistant

## 2024-05-13 NOTE — Telephone Encounter (Signed)
 Pt called and would like the July and August scripts of adderall be cancelled at [facility] and sent to [facility]. They said that if you put dr. Geoffry dea # on comment section or on the instruction section. Then we don't have to call with the number

## 2024-05-13 NOTE — Telephone Encounter (Signed)
 Canceled all scripts at [facility]. Will pend to [facility] when appropriate.

## 2024-05-23 ENCOUNTER — Encounter: Payer: Self-pay | Admitting: Neurology

## 2024-06-04 ENCOUNTER — Other Ambulatory Visit: Payer: Self-pay

## 2024-06-04 MED ORDER — AMPHETAMINE-DEXTROAMPHETAMINE 20 MG PO TABS: 20.0000 mg | ORAL_TABLET | Freq: Three times a day (TID) | ORAL | 0 refills | Status: DC

## 2024-06-04 NOTE — Telephone Encounter (Signed)
 Pended RF to [facility] with DEA notation.

## 2024-06-18 ENCOUNTER — Encounter: Payer: Self-pay | Admitting: Adult Health

## 2024-06-24 ENCOUNTER — Ambulatory Visit: Payer: BC Managed Care – PPO | Admitting: Adult Health

## 2024-06-24 ENCOUNTER — Encounter: Payer: Self-pay | Admitting: Adult Health

## 2024-06-24 VITALS — BP 121/73 | HR 78 | Ht 60.0 in | Wt 142.0 lb

## 2024-06-24 DIAGNOSIS — G4710 Hypersomnia, unspecified: Secondary | ICD-10-CM | POA: Diagnosis not present

## 2024-06-24 DIAGNOSIS — G4733 Obstructive sleep apnea (adult) (pediatric): Secondary | ICD-10-CM | POA: Diagnosis not present

## 2024-06-24 NOTE — Progress Notes (Signed)
 Primary neurologist: Dr. Chalice Reason for visit: CPAP and hyperinsomnia f/u    Chief Complaint  Patient presents with   Obstructive Sleep Apnea    Rm 3 alone Pt is well and stable, reports no new OSA/involuntary movement concerns.      HISTORY OF PRESENT ILLNESS:  Update 06/24/2024 JM: Patient returns for yearly follow-up visit.  She continues to do well with BiPAP therapy.  Compliance report over the past 30 days shows 100% compliance with average usage 7 hours and 16 minutes.  Residual AHI 0.5 on pressure settings of 7/10 with RR 10.  Leaks in the 95th percentile 14.8.  Continues to tolerate BiPAP well. ESS 0/24. FSS 39/63. Follows with DME [facility] but supplies can be expensive, looking into ordering through [employer]. She also remains on Xywav , reduced dose to 4.25 BID which she has tolerated better than the 4.5 BID dosing, doesn't wake up feeling as groggy. Notes continued benefit with Xywav , usually takes second dose 3.5 hrs after initial dose. Continues on [name] IR 30mg  AM and 30mg  afternoon (rx'd by psych). Routinely follows with psych, remains on Austedo  with good management of tardive dyskinesia and venlafaxine  for mood.       History provided for reference purposes only Update 06/21/2023 Dr. Chalice:   she is a highly compliant BiPAP user: I originally met with Lorn Kuba in 2018 when she was referred for excessive daytime sleepiness.  Her complaint or concern of daytime sleepiness and fatigue had been followed by her long-term psychiatrist previous psychiatrist Dr. Kirt and she was treated with antidepressants but stated that she never felt that there was a relief or a decrease in fatigue.  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  Idiopathic hypersomnia is  characterized by fragmented sleep, nonrefreshing nonrestorative sleep often with prolonged sleep phases these are patients that may sleep 7 or 8 hours or even 9 hours at night but do not wake up refreshed from it and persist through the day to feel fatigued and sleepy.  She continued for a long time to be treated on antidepressants while we initiated CPAP and finally [name] and Xywav  which had recent indication for idiopathic hypersomnia from the FDA.   Today's Epworth Sleepiness Scale was endorsed at only 2 points out of 24 so she is no longer excessively sleepy and daytime but she remains still highly fatigued the fatigue severity scale was endorsed at 46 out of 63 points.      OSA :  Mrs. Hoogland is a compliant BiPAP user and she is on a BiPAP at the setting of 17 over 10 cm water with a backup respiratory rate of 10.  In the polysomnography Hedda she is on 17/10 with an AST of 10.  Her residual apnea index is very low 0.4/h and there is no high air leak either and she is highly compliant 100% of days and 97% of hours with an average use at time of 7-1/2 hours.  Again, it was not the start of BiPAP that made her necessarily feel less fatigued nor less sleepy.  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  For the last 24 to 18 months there have  been notes from her behavioral health specialist that she has more or increasing lip movements mouth movements oral movements also squeezing sensation of the right foot and a rolling of the right thumb.  2 medications were initiated both of them did not resolve the issue.  I would like to add that the patient has weaned off antidepressants and it may be the weaning process that has caused or exacerbated some of the orofacial dyskinesia and peripheral tardive dyskinesia.  It is not unknown that if a dopamine receptor binding medication is reduced the  tardive dyskinesia component can be exacerbated.   Her new psychiatrist asked her to wean off [name] GLENWOOD Zebedee Cooks, [place], [phone]   Update 12/06/2022 JM: Patient returns for 80-month follow-up unaccompanied.  Review of BiPAP compliance report over the past 90 days shows 90 out of 90 usage days with 89 days greater than 4 hours for 99% compliance.  Average usage 6 hours and 47 minutes.  Residual AHI 0.4 on pressure settings of 17/10.  Leaks in the 95 percentile 4.2.  Continues to tolerate BiPAP well.  No issues or concerns regarding BiPAP today.  She does report persistent fatigue symptoms. She continues on Xywav  4.5 G BID and [name] 30mg  AM and 20mg  afternoon for idiopathic hypersomnia.  Xywav  may not work as well with increased stress. [name] working well but at times can be fatigued still even after taking. She is currently considering looking for a different type of job, currently working as a Runner, broadcasting/film/video but causes much stress. 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  At prior visit, further evaluation of persistent fatigue with lab work completed which was unremarkable.  She continues to follow with Dr. Vincente psychiatry.  Epworth Sleepiness Scale 1/24 (prior 16/24).  Fatigue severity scale 54/63 (prior 61/63).   Update 06/02/2022 Dr. Chalice: Mrs. Peckman has followed here compliantly as a patient with a chief concern of hypersomnia.  She is also treated for sleep apnea.  Her last 3 visits were with nurse practitioners here in our office and she had still voiced a concern of hypersomnia being present while her sleep apnea was treated.  This persistent hypersomnia is not longer a concern- see EDS- more the fatigue severity-  expressed as a severe fatigue. Tremors have much improved after psychiatrist changed her from abilify . She had been over a year on [name]  when she developed tremors. Now on Spravato (?) , not entered into Epic.  SPRAVATO. LFTs have remained normal.   Fatigue is what is her chief concern , not sleepiness. FSS at 59/ 63 points- on Adderral and on xywav .  Epworth today on XyWAV  4.5 g bin at 3/ 24 points. Good results for apnea resolution on BiPAP.     Today's BiPAP shows 100% compliance for the last 30 days actually looking back the last 90 days and an average of 7-1/2 hours of BiPAP use each night.  The inspiratory pressure is set at 17 expiratory pressure at 10 and she has an ST rate so-called respiratory capture rate of 10/min so she is using a BiPAP S10 with a resolution of apnea to an AHI or apnea hypopnea index, of 0.5/h with minimal air leak.     So I cannot state that neither apnea nor idiopathic hypersomnia are at this time responsible for her fatigue and that there has to be another source.  Her apnea is optimally treated and her sleepiness has decreased on Xywav .  The patient permittit  me access to her [facility] through her smart phone ; dictated 06-02-2022:  The patient's basic metabolic profile at [facility]  obtained yesterday 01 June 2022   showed normal fasting glucose BUN of 15 creatinine 0.77 mg/dL.  Glomerular filtration rate 98 which is excellent.  Sodium 139 mmol potassium 4.5 mmol chloride 104 mmol CO2 27 mmol anion gap 12.6 mmol calcium  9.9 mg/dL albumin 4.7 g/dL liver function tests were not included.  He has a second a TSH thyroid -stimulating hormone wasat 2.34 international unit/mL this is also in normal range.  A CBC with differential did not show elevated white blood cell counts, no evidence of anemia.   For patient with a chronic fatigue component I will add a rheumatological panel, CK, we will also look at T3 and T4 with uptake, there has been no history of neoplasm in the past the patient has never suffered from a cancer or leukemia.    Update 01/31/2022 JM: 43 year old female who returns for initial BiPAP compliance visit and hyperinsomnia.  Continues on Xywav  4.5g at 8pm and 12am and [name] 30 mg daily. Main  complaint is continued daytime sleepiness, she will try not take her [name] on the weekends as she is hopeful this will help have more effect during the week days but she has great difficulty with fatigue if she doesn't take on the weekends and has not noticed improvement of afternoon fatigue. At times will wake up not feeling refreshed. She usually takes [name] between 5-6AM. 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 Fatigue severity scale 56/63 (prior 60 12/2020), Epworth Sleepiness Scale 0 (prior 5 10/2021).  Routinely followed by psychiatry who recently discontinued [name]  due to tremors and tardive dyskinesias and started on Ingrezza and amantadine with symptoms gradually improving.  Continues to work with PCP correcting hypothyroidism, most recent TSH improved although still slightly elevated.    She received new BiPAP machine back in January with compliance report over the past 30 days showing excellent compliance at 100% (total and >4hrs usage) and optimal residual AHI 0.2.  Leaks in the 95th percentile 6.9.  IPAP 17, EPAP 10. Prior AHI on CPAP at 12.6! Overall tolerating BiPAP well except more recently experiencing mask leak around bridge of her nose.   Update 11/11/2021 ALL:  Leilanee Righetti is a 43 y.o. female here today for follow up for idiopathic hypersomnia. She has been compliant on CPAP therapy but AHI not well managed. BiPAP has been ordered but she is waiting on DME to call with machine (currently backordered). She still has more fatigue in the evenings.   She continues Xywav  4.5g twice nightly. First dose when in bed at 8pm. Second dose usually around 12am. She usually wakes around 5am. She does not wake feeling refreshed. She is following closely with PCP. TSH was high and recent adjustments to synthroid  were made. She has not received new dose from pharmacy.   Dr Vincente continues to follow closely. She continues lithium . She feels mood is stable  although she has felt a little more irritable that she normally does.   HISTORY (copied from Dr Dohmeier's previous note)  Rv 01-12-2021; with husband, here with persistent hypersomnia and extreme fatigue, no cardiac disease, no diabetes, but elevated BP.    Has dizzyspells.lightheaded but positive romberg, staring spells, weakness spells. No loss of awareness.    HLA positive, MSLT negative- I would still consider her narcolepstic and like to change her from [name]  to [name].  She is not functioning at work and feels overwhelmed, and she is afraid she can't find another job. Thi sounds more like depression.        REVIEW OF SYSTEMS: Out of a complete 14 system review of symptoms, the patient complains only of the following symptoms, fatigue, depression irritability and all other reviewed systems are negative.    ALLERGIES: Allergies  Allergen Reactions   Lamictal  [Lamotrigine ] Rash   Doxycycline Other (See Comments)    GI UPSET   Effexor  [Venlafaxine ] Other (See Comments)    DYSPHORIA   Savella [Milnacipran Hcl] Other (See Comments)    NO RELIEF   Prilosec [Omeprazole ] Itching     HOME MEDICATIONS: Outpatient Medications Prior to Visit  Medication Sig Dispense Refill   amphetamine -dextroamphetamine  ([name]) 20 MG tablet Take 1 tablet (20 mg total) by mouth 3 (three) times daily. 90 tablet 0   [START ON 07/03/2024] amphetamine -dextroamphetamine  ([name]) 20 MG tablet Take 1 tablet (20 mg total) by mouth 3 (three) times daily. 90 tablet 0   amphetamine -dextroamphetamine  ([name]) 20 MG tablet Take 1 tablet (20 mg total) by mouth 3 (three) times daily. 90 tablet 0   [place]ED[place]  XR 24 MG TB24 TAKE 2 TABLETS BY MOUTH 1 TIME A DAY 180 tablet 1   Ca, Mg, K, and Na Oxybates (XYWAV ) 500 MG/ML SOLN Take 4.5 G twice nightly 540 mL 5   Cholecalciferol (VITAMIN D3) 125 MCG (5000 UT) CAPS Take 1 capsule by mouth daily.     ferrous sulfate 325 (65 FE) MG tablet Take 325 mg by mouth daily.      FOLIC ACID  PO Take by mouth.     latanoprost (XALATAN) 0.005 % ophthalmic solution 1 drop at bedtime.     levothyroxine  (SYNTHROID ) 125 MCG tablet Take 100 mcg by mouth every morning.     rosuvastatin  (CRESTOR ) 5 MG tablet Take  1 tablet  Daily  for Cholesterol (Patient taking differently: Takes 1 tab on Monday, Wednesday and Friday) 90 tablet 3   UNABLE TO FIND BiPAP     venlafaxine  XR (EFFEXOR -XR) 37.5 MG 24 hr capsule Take 1 capsule (37.5 mg total) by mouth daily with breakfast. With 75 mg=112.5 mg daily 90 capsule 1   venlafaxine  XR (EFFEXOR -XR) 75 MG 24 hr capsule Take 1 capsule (75 mg total) by mouth every evening. Take with 37.5 mg= 112.5 mg daily. 90 capsule 1   vitamin E 100 UNIT capsule Take by mouth daily. Takes 180mg      dicyclomine  (BENTYL ) 20 MG tablet Take 20 mg by mouth 3 (three) times daily. (Patient not taking: Reported on 09/26/2023)     Iron-Vitamin C 65-125 MG TABS Take by mouth daily.     No facility-administered medications prior to visit.     PAST MEDICAL HISTORY: Past Medical History:  Diagnosis Date   Anemia    Anxiety    Constipation    Depression    Fibromyalgia    GERD (gastroesophageal reflux disease)    Glaucoma    Hemorrhoids    HSV-1 infection    Hypertension    Hypothyroidism    Idiopathic hypersomnia    Idiopathic hypersomnia    OSA on CPAP    Other abnormal glucose    Preeclampsia      PAST SURGICAL HISTORY: Past Surgical History:  Procedure Laterality Date   ANAL RECTAL MANOMETRY N/A 06/21/2021   Procedure: ANO RECTAL MANOMETRY;  Surgeon: Teresa Lonni HERO, MD;  Location: [facility];  Service: General;  Laterality: N/A;  CARPAL TUNNEL RELEASE Right 02/2020   CHOLECYSTECTOMY  2006   thumb surgery Bilateral    trigger thumb   TONSILLECTOMY     WISDOM TOOTH EXTRACTION       FAMILY HISTORY: Family History  Problem Relation Age of Onset   Depression Mother    Hypothyroidism Mother    Cervical cancer Mother    Bipolar  disorder Mother    Diabetes Mother    Colon polyps Mother    Bipolar disorder Brother    Kidney disease Brother    Alcohol abuse Brother    Heart disease Maternal Aunt    Diabetes Maternal Aunt    Lung cancer Maternal Grandfather    Heart disease Maternal Grandmother    Diabetes Maternal Grandmother    Cancer Maternal Grandmother        SKIN   Hyperlipidemia Maternal Grandmother    Hypertension Maternal Grandmother    Hypothyroidism Maternal Grandmother    Diabetes Cousin    Hypothyroidism Daughter    Colon cancer Neg Hx      SOCIAL HISTORY: Social History   Socioeconomic History   Marital status: Married    Spouse name: Not on file   Number of children: 2   Years of education: Not on file   Highest education level: Not on file  Occupational History   Occupation: TEACHER    Employer: [employer]  Tobacco Use   Smoking status: Never   Smokeless tobacco: Never  Vaping Use   Vaping status: Never Used  Substance and Sexual Activity   Alcohol use: Not Currently    Comment: occ   Drug use: No   Sexual activity: Yes    Partners: Male    Birth control/protection: Surgical    Comment: husband has had a vasectomy  Other Topics Concern   Not on file  Social History Narrative   Parents divorced when she was 1. Never abused. Parents didn't get along.    Teacher, special ed for 17 years.    Married, 2 daughters still live at home.       Caffeine-0-1   Religion-[employer]ist not going to church   Legal none   Social Drivers of Health   Financial Resource Strain: Low Risk  (10/11/2023)   Overall Financial Resource Strain (CARDIA)    Difficulty of Paying Living Expenses: Not hard at all  Food Insecurity: No Food Insecurity (10/11/2023)   Hunger Vital Sign    Worried About Running Out of Food in the Last Year: Never true    Ran Out of Food in the Last Year: Never true  Transportation Needs: No Transportation Needs (10/11/2023)   PRAPARE - Therapist, art (Medical): No    Lack of Transportation (Non-Medical): No  Physical Activity: Insufficiently Active (10/11/2023)   Exercise Vital Sign    Days of Exercise per Week: 2 days    Minutes of Exercise per Session: 30 min  Stress: Stress Concern Present (10/11/2023)   [employer] of Occupational Health - Occupational Stress Questionnaire    Feeling of Stress : To some extent  Social Connections: Moderately Integrated (10/11/2023)   Social Connection and Isolation Panel    Frequency of Communication with Friends and Family: Twice a week    Frequency of Social Gatherings with Friends and Family: Once a week    Attends Religious Services: 1 to 4 times per year    Active Member of [employer] or Organizations: No    Attends [employer]  or Organization Meetings: Never    Marital Status: Married  Catering manager Violence: Not At Risk (10/11/2023)   Humiliation, Afraid, Rape, and Kick questionnaire    Fear of Current or Ex-Partner: No    Emotionally Abused: No    Physically Abused: No    Sexually Abused: No     PHYSICAL EXAM  Vitals:   06/24/24 1339  BP: 121/73  Pulse: 78  Weight: 142 lb (64.4 kg)  Height: 5' (1.524 m)   Body mass index is 27.73 kg/m.  Generalized: Well developed, pleasant middle-age Caucasian female, in no acute distress Cardiology: normal rate and rhythm, no murmur auscultated  Respiratory: clear to auscultation bilaterally    Neurological examination  Mentation: Alert oriented to time, place, history taking. Follows all commands speech and language fluent, flat affect and depressed mood Cranial nerve II-XII: Pupils were equal round reactive to light. Extraocular movements were full, visual field were full on confrontational test. Facial sensation and strength were normal. Head turning and shoulder shrug  were normal and symmetric. Motor: The motor testing reveals 5 over 5 strength of all 4 extremities. Good symmetric motor tone is noted throughout.   Gait and station: Gait is normal.     DIAGNOSTIC DATA (LABS, IMAGING, TESTING) - I reviewed patient records, labs, notes, testing and imaging myself where available.  Lab Results  Component Value Date   WBC 8.8 11/11/2021   HGB 14.6 11/11/2021   HCT 43.7 11/11/2021   MCV 89 11/11/2021   PLT 171 11/11/2021      Component Value Date/Time   NA 141 06/02/2022 1208   K 4.7 06/02/2022 1208   CL 102 06/02/2022 1208   CO2 22 06/02/2022 1208   GLUCOSE 88 06/02/2022 1208   GLUCOSE 95 05/27/2021 1240   BUN 15 06/02/2022 1208   CREATININE 0.68 06/02/2022 1208   CREATININE 0.69 01/27/2021 1700   CALCIUM  9.7 06/02/2022 1208   PROT 7.3 06/02/2022 1208   ALBUMIN 4.9 06/02/2022 1208   AST 12 06/02/2022 1208   ALT 7 06/02/2022 1208   ALKPHOS 55 06/02/2022 1208   BILITOT 0.6 06/02/2022 1208   GFRNONAA >60 05/27/2021 1240   GFRNONAA 107 01/27/2021 1700   GFRAA 124 01/27/2021 1700   Lab Results  Component Value Date   CHOL 170 10/29/2020   HDL 58 10/29/2020   LDLCALC 88 10/29/2020   LDLDIRECT 114.9 04/30/2012   TRIG 139 10/29/2020   CHOLHDL 2.9 10/29/2020   Lab Results  Component Value Date   HGBA1C 5.9 (H) 01/27/2021   Lab Results  Component Value Date   VITAMINB12 687 06/02/2022   Lab Results  Component Value Date   TSH 2.610 06/02/2022         No data to display               No data to display           ASSESSMENT AND PLAN  43 y.o. year old female with     Obstructive sleep apnea treated with bilevel positive airway pressure (BiPAP) - Plan: For home use only DME continuous positive airway pressure (CPAP)  Hypersomnia, persistent   OSA on BiPAP Compliance report shows satisfactory usage with optimal residual AHI.   Continue pressure settings of 17/10 with RR 10 Discussed continued nightly usage with ensuring greater than 4 hours nightly for optimal benefit and per insurance purposes.   Continue to follow with DME company for any needed  supplies or CPAP related concerns CPAP  set up 11/2021   Hypersomnia -Continue Xywav  4.25 mg twice nightly - refill up to date -Continue [name] 30 mg AM and 30mg  afternoon - managed by psych  -Tried/failed: [name] , modafinil, armodafinil       Orders Placed This Encounter  Procedures   For home use only DME continuous positive airway pressure (CPAP)    DME [facility]  Continue current pressure settings of 17/10 with RR 10  Continue to provide supplies as indicated    Length of Need:   Lifetime    Patient has OSA or probable OSA:   Yes    Is the patient currently using CPAP in the home:   Yes    If yes (to question two):   Determine DME provider and inform them of any new orders/settings    Date of face to face encounter:   06/24/2024    Settings:   Autotitration    CPAP supplies needed:   Mask, headgear, cushions, filters, heated tubing and water chamber    Additional equipment included:   Heated humification and supplies     No orders of the defined types were placed in this encounter.    I personally spent a total of 30 minutes in the care of the patient today including preparing to see the patient, performing a medically appropriate exam/evaluation, counseling and educating, placing orders, and documenting clinical information in the EHR.   Harlene Bogaert, AGNP-BC  [facility] [address] [place], [place] [address]  Phone [phone] Fax [phone] Note: This document was prepared with digital dictation and possible smart phrase technology. Any transcriptional errors that result from this process are unintentional.

## 2024-06-24 NOTE — Patient Instructions (Addendum)
 Your Plan:  Continue nightly use of BiPAP for adequate sleep apnea management   Continue Xywav  at current dosage  Continue close follow up with psychiatry as scheduled       Follow up in 1 year or call earlier if needed      Thank you for coming to see us  at [facility]. I hope we have been able to provide you high quality care today.  You may receive a patient satisfaction survey over the next few weeks. We would appreciate your feedback and comments so that we may continue to improve ourselves and the health of our patients.

## 2024-06-24 NOTE — Progress Notes (Signed)
 Laqueisha Catalina D, CMA  Joylene Bradley; Cain, Mitchell; Garcia, Patricia; Ziegler, Melissa; Tucker, Dolanda New orders have been placed for the above pt, DOB: 04-23-1981 Thanks

## 2024-07-03 ENCOUNTER — Telehealth: Payer: Self-pay | Admitting: Physician Assistant

## 2024-07-03 ENCOUNTER — Other Ambulatory Visit: Payer: Self-pay | Admitting: Physician Assistant

## 2024-07-03 NOTE — Telephone Encounter (Signed)
 [facility] called regarding Lauren [name]. Supervising provider is Dr. Lorene Macintosh. She has an RX for [name] 20 mg which is a controlled substance and the DEA # is required on the prescription. Please call them at [phone] then option 2.

## 2024-07-03 NOTE — Telephone Encounter (Signed)
 Information provided.

## 2024-07-15 ENCOUNTER — Encounter: Payer: Self-pay | Admitting: Physician Assistant

## 2024-07-15 ENCOUNTER — Ambulatory Visit: Admitting: Physician Assistant

## 2024-07-15 DIAGNOSIS — F329 Major depressive disorder, single episode, unspecified: Secondary | ICD-10-CM | POA: Diagnosis not present

## 2024-07-15 DIAGNOSIS — Z1589 Genetic susceptibility to other disease: Secondary | ICD-10-CM

## 2024-07-15 DIAGNOSIS — G4711 Idiopathic hypersomnia with long sleep time: Secondary | ICD-10-CM | POA: Diagnosis not present

## 2024-07-15 DIAGNOSIS — G2401 Drug induced subacute dyskinesia: Secondary | ICD-10-CM | POA: Diagnosis not present

## 2024-07-15 DIAGNOSIS — F900 Attention-deficit hyperactivity disorder, predominantly inattentive type: Secondary | ICD-10-CM

## 2024-07-15 MED ORDER — VENLAFAXINE HCL ER 37.5 MG PO CP24: 37.5000 mg | ORAL_CAPSULE | Freq: Every day | ORAL | 1 refills | Status: AC

## 2024-07-15 MED ORDER — AMPHETAMINE-DEXTROAMPHETAMINE 20 MG PO TABS: 20.0000 mg | ORAL_TABLET | Freq: Three times a day (TID) | ORAL | 0 refills | Status: DC

## 2024-07-15 MED ORDER — CLONAZEPAM 0.5 MG PO TABS: 0.2500 mg | ORAL_TABLET | Freq: Every evening | ORAL | 1 refills | Status: DC | PRN

## 2024-07-15 NOTE — Progress Notes (Unsigned)
 [facility]  Patient ID: Lauren [name],  MRN: [id number]  PCP: Alys Schuyler HERO, PA  Date of Evaluation: 07/15/2024 Time spent:20 minutes  Chief Complaint:  Chief Complaint   Anxiety; ADD; Depression; Follow-up    HISTORY/CURRENT STATUS: HPI For routine f/u  She had a great summer.  'The best in a long time.' States she felt good physically and that helped her mental health. School started back today and she had a really good day and hopes that continues throughout the school year. Doesn't feel overly anxious.   Patient is able to enjoy things.  Energy and motivation are good.  No extreme sadness, tearfulness, or feelings of hopelessness.  Sleeps well most of the time. ADLs and personal hygiene are normal.   Appetite has not changed.  Weight is stable.  No mania, delirium, AH/VH.  No SI/HI.  [name] is still effective. States that attention is good without easy distractibility.  Able to focus on things and finish tasks to completion.   Austedo  is still helping the TD, however over the past few days, she's had cramping of her right foot, which was one of the sx of TD in the first place.  Not having abnl truncal or mouth movements. No tremor.  Denies dizziness, syncope, seizures, numbness, tingling, tics, unsteady gait, slurred speech, confusion.   Individual Medical History/ Review of Systems: Changes? :No   Past medications for mental health diagnoses include: Spravato for about 3 months (made her worse, not better)  took it 1-2 times a week.  Effexor , Lithium , [name], Xanax , elavil , and 'everything that's out there.' She can't name the meds.  Pristiq ,  Prozac , lamictal  caused rash, Latuda , Lithium , [name] , [name] ,Trazodone , Austedo , Ingrezza didn't help.   Allergies: Lamictal  [lamotrigine ], Doxycycline, Effexor  [venlafaxine ], Savella [milnacipran hcl], and Prilosec [omeprazole ]  Current Medications:  Current Outpatient Medications:    AUSTEDO  XR 24 MG  TB24, TAKE 2 TABLETS BY MOUTH 1 TIME A DAY, Disp: 180 tablet, Rfl: 1   Ca, Mg, K, and Na Oxybates (XYWAV ) 500 MG/ML SOLN, Take 4.5 G twice nightly, Disp: 540 mL, Rfl: 5   Cholecalciferol (VITAMIN D3) 125 MCG (5000 UT) CAPS, Take 1 capsule by mouth daily., Disp: , Rfl:    clonazePAM  (KLONOPIN ) 0.5 MG tablet, Take 0.5-1 tablets (0.25-0.5 mg total) by mouth at bedtime as needed for anxiety., Disp: 30 tablet, Rfl: 1   ferrous sulfate 325 (65 FE) MG tablet, Take 325 mg by mouth daily., Disp: , Rfl:    FOLIC ACID  PO, Take by mouth., Disp: , Rfl:    latanoprost (XALATAN) 0.005 % ophthalmic solution, 1 drop at bedtime., Disp: , Rfl:    levothyroxine  (SYNTHROID ) 125 MCG tablet, Take 100 mcg by mouth every morning., Disp: , Rfl:    rosuvastatin  (CRESTOR ) 5 MG tablet, Take  1 tablet  Daily  for Cholesterol, Disp: 90 tablet, Rfl: 3   UNABLE TO FIND, BiPAP, Disp: , Rfl:    venlafaxine  XR (EFFEXOR -XR) 75 MG 24 hr capsule, Take 1 capsule (75 mg total) by mouth every evening. Take with 37.5 mg= 112.5 mg daily., Disp: 90 capsule, Rfl: 1   vitamin E 100 UNIT capsule, Take by mouth daily. Takes 180mg , Disp: , Rfl:    [START ON 08/05/2024] amphetamine -dextroamphetamine  ([name]) 20 MG tablet, Take 1 tablet (20 mg total) by mouth 3 (three) times daily., Disp: 90 tablet, Rfl: 0   [START ON 09/03/2024] amphetamine -dextroamphetamine  ([name]) 20 MG tablet, Take 1 tablet (20 mg total) by mouth 3 (three) times  daily., Disp: 90 tablet, Rfl: 0   [START ON 10/04/2024] amphetamine -dextroamphetamine  ([name]) 20 MG tablet, Take 1 tablet (20 mg total) by mouth 3 (three) times daily., Disp: 90 tablet, Rfl: 0   venlafaxine  XR (EFFEXOR -XR) 37.5 MG 24 hr capsule, Take 1 capsule (37.5 mg total) by mouth daily with breakfast. With 75 mg=112.5 mg daily, Disp: 90 capsule, Rfl: 1 Medication Side Effects: none  Family Medical/ Social History: Changes? None.  MENTAL HEALTH EXAM:  There were no vitals taken for this visit.There is no  height or weight on file to calculate BMI.  General Appearance: Casual and Well Groomed  Eye Contact:  Good  Speech:  Clear and Coherent and Normal Rate  Volume:  Normal  Mood:  Euthymic  Affect:  Congruent  Thought Process:  Goal Directed and Descriptions of Associations: Circumstantial  Orientation:  Full (Time, Place, and Person)  Thought Content: Logical   Suicidal Thoughts:  No  Homicidal Thoughts:  No  Memory:  WNL  Judgement:  Good  Insight:  Good  Psychomotor Activity:  No abnl movements  Concentration:  Concentration: Good and Attention Span: Good  Recall:  Good  Fund of Knowledge: Good  Language: Good  Assets:  Communication Skills Desire for Improvement Financial Resources/Insurance Housing Leisure Time Resilience Social Support Transportation Vocational/Educational  ADL's:  Intact  Cognition: WNL  Prognosis:  Good   See GeneSight results on chart  AIMS    Flowsheet Row Office Visit from 01/16/2024 in [facility]  AIMS Total Score 2   GAD-7    Flowsheet Row Office Visit from 10/11/2023 in [facility] Office Visit from 12/21/2022 in [facility] Office Visit from 08/25/2021 in [facility]  Total GAD-7 Score 10 17 9    PHQ2-9    Flowsheet Row Office Visit from 10/11/2023 in [facility] Office Visit from 12/21/2022 in [facility] Office Visit from 08/25/2021 in [facility] Office Visit from 03/05/2019 in [facility] Office Visit from 12/31/2018 in [facility]  PHQ-2 Total Score 0 1 0 2 0  PHQ-9 Total Score 3 -- 2 6 --   Flowsheet Row ED from 05/15/2021 in [facility]  C-SSRS RISK CATEGORY No Risk    DIAGNOSES:    ICD-10-CM   1. Treatment-resistant depression  F32.9     2. Tardive dyskinesia  G24.01     3. Attention deficit hyperactivity disorder (ADHD), predominantly inattentive type  F90.0     4. 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  [name] filled 07/06/2024.  Xywav  filled 06/24/2024. I provided approximately 20 minutes of face to face time during this encounter, including time spent before and after the visit in records review, medical decision making, counseling pertinent to today's visit, and charting.   We discussed the muscle cramping in her right foot.  It is hard to say if that is from tardive dyskinesia or not.  She is not having any other signs of that at this time.  She is on the maximum dose of Austedo .  She tried Ingrezza in the past which was not effective at all.  Other options include adding a benzodiazepine, ginkgo biloba to name a few.  I recommend adding Klonopin  initially.  Simply because of the long half-life.  She may need it at night only.  She understands that it is a downer and [name] is an upper.  She should not take them within 4 hours of each other or else the positive benefits for both are negated.  Continue [name] 20 mg,1 p.o. tid.  (Or 1.5 p.o. twice daily.)  Continue Austedo  XR 24 mg, 2 qd.       Start Klonopin  0.5 mg, 1/2-1 p.o. nightly as needed.    Continue Effexor  XR 112.5 mg daily. Continue Bipap machine. Continue therapy. Return in 3 months.  Verneita Cooks, PA-C

## 2024-08-05 ENCOUNTER — Telehealth: Payer: Self-pay | Admitting: Physician Assistant

## 2024-08-05 NOTE — Telephone Encounter (Signed)
Information requested provided.

## 2024-08-05 NOTE — Telephone Encounter (Signed)
 Pharm called about recent script sent in. Order #[id number]. Callback # [phone]

## 2024-08-07 ENCOUNTER — Other Ambulatory Visit: Payer: Self-pay | Admitting: Adult Health

## 2024-08-07 DIAGNOSIS — Z1231 Encounter for screening mammogram for malignant neoplasm of breast: Secondary | ICD-10-CM

## 2024-08-15 ENCOUNTER — Encounter: Payer: Self-pay | Admitting: Neurology

## 2024-08-15 ENCOUNTER — Ambulatory Visit: Admitting: Neurology

## 2024-08-15 ENCOUNTER — Telehealth: Payer: Self-pay | Admitting: Neurology

## 2024-08-15 VITALS — BP 133/86 | HR 84 | Ht 60.0 in | Wt 135.0 lb

## 2024-08-15 DIAGNOSIS — G4710 Hypersomnia, unspecified: Secondary | ICD-10-CM

## 2024-08-15 DIAGNOSIS — H9325 Central auditory processing disorder: Secondary | ICD-10-CM | POA: Diagnosis not present

## 2024-08-15 DIAGNOSIS — R4189 Other symptoms and signs involving cognitive functions and awareness: Secondary | ICD-10-CM

## 2024-08-15 DIAGNOSIS — G4733 Obstructive sleep apnea (adult) (pediatric): Secondary | ICD-10-CM | POA: Diagnosis not present

## 2024-08-15 DIAGNOSIS — R259 Unspecified abnormal involuntary movements: Secondary | ICD-10-CM

## 2024-08-15 NOTE — Telephone Encounter (Signed)
 no auth required sent to GI per patient request she wants a Thursday and she will call me back to schedule here if its too far out. [phone]

## 2024-08-15 NOTE — Patient Instructions (Signed)
 ASSESSMENT AND PLAN :   43 y.o. year old female  here with:  Concern about memory, amnestic for conversations, movies, chapters of books and mostly verbal / auditory information.  But also difficulties on some days to find her way to a familiar place , remember a PIN number.     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   There is also underlying depression,, sometimes feeling overwhelmed or incompetent.    There is complete AMNESIA for the questioned events.   PLAN :  EEG, at [facility]  MRI brain c/ s [facility] panel for encephalopathy markers (  dementia panel ').  Continue depression and anxiety treatment.   Continue sleep medications.  Referral to neuropsychology. - bring your notes with you!   RV in 6 months    I would like to thank Clelland, Schuyler HERO, PA  and [employer], [place]  E, [place] [address] [place],  [place] [address] for allowing me to meet with this pleasant patient.

## 2024-08-15 NOTE — Progress Notes (Signed)
 Provider:  Dedra Gores, MD  Primary Care Physician:  Alys Schuyler HERO, PA [address] [place] [address]     Referring Provider: Cleotilde, [place]  E, Pa [address] [place],  [place] [address]          Chief Complaint according to patient   Patient presents with:          Persistent hypersomnia,  central apnea on CPAP,  involuntary movements  and now seen here for subjective memory loss.  Had memory problems in HS age, but worsening now. On Xywav  for  narcolepsy/ hypersomnia.   Special ed teacher       HISTORY OF PRESENT ILLNESS:  Lauren [name] is a 43 y.o. female patient who is here for a memory concern on  08/15/2024 .    Chief concern according to patient :   my colleagues tell me about a work assignment I have forgotten about.  I have forgotten to pick up food I had ordered, I have asked my daughter if she had permission to leave for a trip and she answered that I had given the permission- I am reading and forget the content.   If a pupils parent told me something about their child; allergies or seizure disorder etc, I will forget it if not  immediately putting it on paper.'   I try my hardest to remember these conversations , situations and connect my memory- but I can't.   I have trouble finding a name or how to spell a word- and I am working as a Runner, broadcasting/film/video    I have a to-do list now on my phone- this helps me   At times , I have trouble to remember my PIN number or passwords.    [name] brought me a psychoeducational evaluation report from her sophomore years in college.  At the time she was 43 years old and she was referred for difficulties in college classes and suspected learning disabilities.  She graduated high school she went to [employer] college and during the 19 07/1999 academic year she attended [employer] and enrolled finally at [employer] college for fall 2000.  She had a  significant history for speech and language delay initially seen when she was just 43 years old at [facility] the presenting problem involved articulation difficulties her speech was characterized by articulation errors.  Language was marked delayed and she received speech and language therapy for a number of years and made excellent progress.  She was then referred by Dr. Marcos Sanders.  There was a relative strengths in visual learning but significant problems with auditory memory and this memory problem was not related to distractibility or attention.  Her academic achievement were consistent with her abilities.  And so she was not found to be eligible for a learning disability program also she was found to have some of the characteristics and comment.  She has no significant physical health problems.  Her work style was reflective and persistent and she displayed no signs of attentional problems.  However this report would really explain why she does not always process auditory information easily.  It is helping her to see and have visual input so writing something down is much more helpful in organizing her memory than putting on a verbal cue.     08/15/2024   10:20 AM  Montreal Cognitive Assessment   Visuospatial/ Executive (0/5) 5  Naming (0/3) 3  Attention: Read list of digits (0/2) 2  Attention: Read list of letters (0/1) 1  Attention: Serial 7 subtraction starting at 100 (0/3) 2  Language: Repeat phrase (0/2) 0  Language : Fluency (0/1) 1  Abstraction (0/2) 2  Delayed Recall (0/5) 4  Orientation (0/6) 6  Total 26         Fam Hx : see previous note  Social HX; see previous note       Review of Systems: Out of a complete 14 system review, the patient complains of only the following symptoms, and all other reviewed systems are negative.:         Social History   Socioeconomic History   Marital status: Married    Spouse name: Not on file   Number of children: 2    Years of education: Not on file   Highest education level: Not on file  Occupational History   Occupation: TEACHER    Employer: [employer]  Tobacco Use   Smoking status: Never   Smokeless tobacco: Never  Vaping Use   Vaping status: Never Used  Substance and Sexual Activity   Alcohol use: Not Currently    Comment: occ   Drug use: No   Sexual activity: Yes    Partners: Male    Birth control/protection: Surgical    Comment: husband has had a vasectomy  Other Topics Concern   Not on file  Social History Narrative   Parents divorced when she was 1. Never abused. Parents didn't get along.    Teacher, special ed for 17 years.    Married, 2 daughters still live at home.       Caffeine-0-1   Religion-Methodist not going to church   Legal none   Social Drivers of Health   Financial Resource Strain: Low Risk  (10/11/2023)   Overall Financial Resource Strain (CARDIA)    Difficulty of Paying Living Expenses: Not hard at all  Food Insecurity: No Food Insecurity (10/11/2023)   Hunger Vital Sign    Worried About Running Out of Food in the Last Year: Never true    Ran Out of Food in the Last Year: Never true  Transportation Needs: No Transportation Needs (10/11/2023)   PRAPARE - Administrator, Civil Service (Medical): No    Lack of Transportation (Non-Medical): No  Physical Activity: Insufficiently Active (10/11/2023)   Exercise Vital Sign    Days of Exercise per Week: 2 days    Minutes of Exercise per Session: 30 min  Stress: Stress Concern Present (10/11/2023)   [employer] of Occupational Health - Occupational Stress Questionnaire    Feeling of Stress : To some extent  Social Connections: Moderately Integrated (10/11/2023)   Social Connection and Isolation Panel    Frequency of Communication with Friends and Family: Twice a week    Frequency of Social Gatherings with Friends and Family: Once a week    Attends Religious Services: 1 to 4 times per  year    Active Member of [employer] or Organizations: No    Attends Banker Meetings: Never    Marital Status: Married    Family History  Problem Relation Age of Onset   Depression Mother    Hypothyroidism Mother    Cervical cancer Mother    Bipolar disorder Mother    Diabetes Mother    Colon polyps Mother    Bipolar disorder Brother    Kidney disease Brother  Alcohol abuse Brother    Heart disease Maternal Aunt    Diabetes Maternal Aunt    Lung cancer Maternal Grandfather    Heart disease Maternal Grandmother    Diabetes Maternal Grandmother    Cancer Maternal Grandmother        SKIN   Hyperlipidemia Maternal Grandmother    Hypertension Maternal Grandmother    Hypothyroidism Maternal Grandmother    Diabetes Cousin    Hypothyroidism Daughter    Colon cancer Neg Hx     Past Medical History:  Diagnosis Date   Anemia    Anxiety    Constipation    Depression    Fibromyalgia    GERD (gastroesophageal reflux disease)    Glaucoma    Hemorrhoids    HSV-1 infection    Hypertension    Hypothyroidism    Idiopathic hypersomnia    Idiopathic hypersomnia    OSA on CPAP    Other abnormal glucose    Preeclampsia     Past Surgical History:  Procedure Laterality Date   ANAL RECTAL MANOMETRY N/A 06/21/2021   Procedure: ANO RECTAL MANOMETRY;  Surgeon: Teresa Lonni HERO, MD;  Location: [facility];  Service: General;  Laterality: N/A;   CARPAL TUNNEL RELEASE Right 02/2020   CHOLECYSTECTOMY  2006   thumb surgery Bilateral    trigger thumb   TONSILLECTOMY     WISDOM TOOTH EXTRACTION       Current Outpatient Medications on File Prior to Visit  Medication Sig Dispense Refill   amphetamine -dextroamphetamine  ([name]) 20 MG tablet Take 1 tablet (20 mg total) by mouth 3 (three) times daily. 90 tablet 0   [START ON 09/03/2024] amphetamine -dextroamphetamine  ([name]) 20 MG tablet Take 1 tablet (20 mg total) by mouth 3 (three) times daily. 90 tablet 0   [START ON  10/04/2024] amphetamine -dextroamphetamine  ([name]) 20 MG tablet Take 1 tablet (20 mg total) by mouth 3 (three) times daily. 90 tablet 0   AUSTEDO  XR 24 MG TB24 TAKE 2 TABLETS BY MOUTH 1 TIME A DAY 180 tablet 1   Ca, Mg, K, and Na Oxybates (XYWAV ) 500 MG/ML SOLN Take 4.5 G twice nightly 540 mL 5   Cholecalciferol (VITAMIN D3) 125 MCG (5000 UT) CAPS Take 1 capsule by mouth daily.     ferrous sulfate 325 (65 FE) MG tablet Take 325 mg by mouth daily.     FOLIC ACID  PO Take by mouth.     latanoprost (XALATAN) 0.005 % ophthalmic solution 1 drop at bedtime.     levothyroxine  (SYNTHROID ) 125 MCG tablet Take 100 mcg by mouth every morning.     rosuvastatin  (CRESTOR ) 5 MG tablet Take  1 tablet  Daily  for Cholesterol 90 tablet 3   UNABLE TO FIND BiPAP     venlafaxine  XR (EFFEXOR -XR) 37.5 MG 24 hr capsule Take 1 capsule (37.5 mg total) by mouth daily with breakfast. With 75 mg=112.5 mg daily 90 capsule 1   venlafaxine  XR (EFFEXOR -XR) 75 MG 24 hr capsule Take 1 capsule (75 mg total) by mouth every evening. Take with 37.5 mg= 112.5 mg daily. 90 capsule 1   vitamin E 100 UNIT capsule Take by mouth daily. Takes 180mg      No current facility-administered medications on file prior to visit.    Allergies  Allergen Reactions   Lamictal  [Lamotrigine ] Rash   Doxycycline Other (See Comments)    GI UPSET   Effexor  [Venlafaxine ] Other (See Comments)    DYSPHORIA   Savella [Milnacipran Hcl] Other (See Comments)  NO RELIEF   Prilosec [Omeprazole ] Itching     DIAGNOSTIC DATA (LABS, IMAGING, TESTING) - I reviewed patient records, labs, notes, testing and imaging myself where available.  Lab Results  Component Value Date   WBC 8.8 11/11/2021   HGB 14.6 11/11/2021   HCT 43.7 11/11/2021   MCV 89 11/11/2021   PLT 171 11/11/2021      Component Value Date/Time   NA 141 06/02/2022 1208   K 4.7 06/02/2022 1208   CL 102 06/02/2022 1208   CO2 22 06/02/2022 1208   GLUCOSE 88 06/02/2022 1208   GLUCOSE 95  05/27/2021 1240   BUN 15 06/02/2022 1208   CREATININE 0.68 06/02/2022 1208   CREATININE 0.69 01/27/2021 1700   CALCIUM  9.7 06/02/2022 1208   PROT 7.3 06/02/2022 1208   ALBUMIN 4.9 06/02/2022 1208   AST 12 06/02/2022 1208   ALT 7 06/02/2022 1208   ALKPHOS 55 06/02/2022 1208   BILITOT 0.6 06/02/2022 1208   GFRNONAA >60 05/27/2021 1240   GFRNONAA 107 01/27/2021 1700   GFRAA 124 01/27/2021 1700   Lab Results  Component Value Date   CHOL 170 10/29/2020   HDL 58 10/29/2020   LDLCALC 88 10/29/2020   LDLDIRECT 114.9 04/30/2012   TRIG 139 10/29/2020   CHOLHDL 2.9 10/29/2020   Lab Results  Component Value Date   HGBA1C 5.9 (H) 01/27/2021   Lab Results  Component Value Date   VITAMINB12 687 06/02/2022   Lab Results  Component Value Date   TSH 2.610 06/02/2022    PHYSICAL EXAM:  Vitals:   08/15/24 1013  BP: 133/86  Pulse: 84   No data found. Body mass index is 26.37 kg/m.   Wt Readings from Last 3 Encounters:  08/15/24 135 lb (61.2 kg)  06/24/24 142 lb (64.4 kg)  10/11/23 168 lb (76.2 kg)     Ht Readings from Last 3 Encounters:  08/15/24 5' (1.524 m)  06/24/24 5' (1.524 m)  10/11/23 5' (1.524 m)      General: The patient is awake, alert and appears not in acute distress and groomed. Head: Normocephalic, atraumatic.  Neck is supple.  Mallampati 3,     Nasal airflow  patent.   Wears mouth guard , no bite b marks.  Dental status:  biological  Cardiovascular:  Regular rate and cardiac rhythm by pulse, without distended neck veins. Respiratory: no shortness of breath  Skin:  Without evidence of ankle edema, or rash. Trunk: BMI is 26.4    NEUROLOGIC EXAM: The patient is awake and alert, oriented to place and time.   Memory subjective described as impaired, episodic memmory impaired,  auditory  memory impaired, but also loss  of memory for information she read .    Attention span & concentration ability appears normal.   Speech is fluent,  without   dysarthria, dysphonia or aphasia.  Mood and affect are worried-    Neurological Examination: Mental Status: Intact. Language and speech are normal. No cognitive deficits. Cranial Nerves II-XII: Intact. PERL. EOMI. VFF. No nystagmus.  No facial droop.  No ptosis.  Hearing is grossly intact bilaterally.  The tongue is normal and midline. Motor: Strengths are 5/5 throughout. Muscle bulk and tone are normal. No tremors.  Coordination: No ataxia or dysmetria.  Sensory: Grossly intact throughout to all modalities. Reflexes: Normal and symmetric throughout. No ankle clonus. Babinski's sign is absent bilaterally. Hoffman's sign is absent bilaterally. Gait and Station: Normal. Romberg's sign is absent.   ASSESSMENT AND PLAN :  43 y.o. year old female  here with:  Concern about memory, amnestic for conversations, movies, chapters of books and mostly verbal / auditory information.  But also difficulties on some days to find her way to a familiar place , remember a PIN number.     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   There is also underlying depression,, sometimes feeling overwhelmed or incompetent.   PLAN :  EEG, MRI brain [facility] panel for encephalopathy markers (  dementia panel ').  Continue depression and anxiety treatment.   Continue sleep medications.  Referral to neuropsychology.    RV in 6 months    I would like to thank [place]lland, Schuyler HERO, PA  and [employer], [place]  E, [place] [address] [place],  [place] [address] for allowing me to meet with this pleasant patient.     The patient will be seen in follow-up in the sleep clinic at [facility] for discussion of test results,  related symptoms and treatment compliance review, further management strategies, etc.   The referring provider will be notified of the test results.   The patient's condition requires frequent monitoring and adjustments in the  treatment plan, reflecting the ongoing complexity of care.  This provider is the continuing focal point for all needed services for this condition.  After spending a total time of  45  minutes face to face and time for  history taking, physical and neurologic examination, review of laboratory studies,  personal review of imaging studies, reports and results of other testing and review of referral information / records as far as provided in visit,   Electronically signed by: Dedra Gores, MD 08/15/2024 11:00 AM  [facility] Board certified by The [employer] of Sleep Medicine and Diplomate of the [employer] of Sleep Medicine. Board certified In Neurology through the ABPN, Fellow of the [employer] of Neurology.

## 2024-08-16 ENCOUNTER — Ambulatory Visit: Payer: Self-pay | Admitting: Neurology

## 2024-08-17 LAB — COMPREHENSIVE METABOLIC PANEL WITH GFR
ALT: 15 IU/L (ref 0–32)
AST: 14 IU/L (ref 0–40)
Albumin: 4.8 g/dL (ref 3.9–4.9)
Alkaline Phosphatase: 57 IU/L (ref 41–116)
BUN/Creatinine Ratio: 20 (ref 9–23)
BUN: 16 mg/dL (ref 6–24)
Bilirubin Total: 0.6 mg/dL (ref 0.0–1.2)
CO2: 23 mmol/L (ref 20–29)
Calcium: 10 mg/dL (ref 8.7–10.2)
Chloride: 100 mmol/L (ref 96–106)
Creatinine, Ser: 0.8 mg/dL (ref 0.57–1.00)
Globulin, Total: 2.9 g/dL (ref 1.5–4.5)
Glucose: 75 mg/dL (ref 70–99)
Potassium: 4.5 mmol/L (ref 3.5–5.2)
Sodium: 138 mmol/L (ref 134–144)
Total Protein: 7.7 g/dL (ref 6.0–8.5)
eGFR: 93 mL/min/1.73 (ref >59)

## 2024-08-17 LAB — CBC WITH DIFFERENTIAL/PLATELET
Basophils Absolute: 0 x10E3/uL (ref 0.0–0.2)
Basos: 0 %
EOS (ABSOLUTE): 0 x10E3/uL (ref 0.0–0.4)
Eos: 0 %
Hematocrit: 47.3 % — ABNORMAL HIGH (ref 34.0–46.6)
Hemoglobin: 15 g/dL (ref 11.1–15.9)
Immature Grans (Abs): 0 x10E3/uL (ref 0.0–0.1)
Immature Granulocytes: 0 %
Lymphocytes Absolute: 2.1 x10E3/uL (ref 0.7–3.1)
Lymphs: 32 %
MCH: 30.3 pg (ref 26.6–33.0)
MCHC: 31.7 g/dL (ref 31.5–35.7)
MCV: 96 fL (ref 79–97)
Monocytes Absolute: 0.4 x10E3/uL (ref 0.1–0.9)
Monocytes: 6 %
Neutrophils Absolute: 4 x10E3/uL (ref 1.4–7.0)
Neutrophils: 62 %
Platelets: 138 x10E3/uL — ABNORMAL LOW (ref 150–450)
RBC: 4.95 x10E6/uL (ref 3.77–5.28)
RDW: 12.1 % (ref 11.7–15.4)
WBC: 6.7 x10E3/uL (ref 3.4–10.8)

## 2024-08-17 LAB — PROTEIN ELECTROPHORESIS, SERUM
A/G Ratio: 1.3 (ref 0.7–1.7)
Albumin ELP: 4.3 g/dL (ref 2.9–4.4)
Alpha 1: 0.3 g/dL (ref 0.0–0.4)
Alpha 2: 0.8 g/dL (ref 0.4–1.0)
Beta: 1 g/dL (ref 0.7–1.3)
Gamma Globulin: 1.3 g/dL (ref 0.4–1.8)
Globulin, Total: 3.4 g/dL (ref 2.2–3.9)

## 2024-08-17 LAB — ANA W/REFLEX: Anti Nuclear Antibody (ANA): NEGATIVE

## 2024-08-17 LAB — HEMOGLOBIN A1C
Est. average glucose Bld gHb Est-mCnc: 100 mg/dL
Hgb A1c MFr Bld: 5.1 % (ref 4.8–5.6)

## 2024-08-17 LAB — VITAMIN B12: Vitamin B-12: 784 pg/mL (ref 232–1245)

## 2024-08-17 LAB — TSH+FREE T4
Free T4: 1.58 ng/dL (ref 0.82–1.77)
TSH: 1.59 u[IU]/mL (ref 0.450–4.500)

## 2024-08-17 LAB — SEDIMENTATION RATE: Sed Rate: 4 mm/h (ref 0–32)

## 2024-08-17 LAB — HOMOCYSTEINE: Homocysteine: 8.2 umol/L (ref 0.0–14.5)

## 2024-08-22 ENCOUNTER — Ambulatory Visit: Admitting: Neurology

## 2024-08-22 DIAGNOSIS — R4182 Altered mental status, unspecified: Secondary | ICD-10-CM | POA: Diagnosis not present

## 2024-08-22 DIAGNOSIS — H9325 Central auditory processing disorder: Secondary | ICD-10-CM

## 2024-08-22 DIAGNOSIS — G4710 Hypersomnia, unspecified: Secondary | ICD-10-CM

## 2024-08-22 DIAGNOSIS — G4733 Obstructive sleep apnea (adult) (pediatric): Secondary | ICD-10-CM

## 2024-08-22 DIAGNOSIS — R4189 Other symptoms and signs involving cognitive functions and awareness: Secondary | ICD-10-CM

## 2024-08-22 DIAGNOSIS — R259 Unspecified abnormal involuntary movements: Secondary | ICD-10-CM

## 2024-08-23 NOTE — Procedures (Signed)
 [facility]  EEG (ELECTROENCEPHALOGRAM) REPORT  [name], Lauren Dire    ORDERING CLINICIAN: Dedra Gores, M.D.  TECHNOLOGIST: ,Burnard Plummer REEGT TECHNIQUE:  This EEG study was done with scalp electrodes positioned according to the 10-20 International system of electrode placement. Electrical activity was reviewed with band pass filter of 1-70Hz , sensitivity of 7 uV/mm, display speed of 63mm/sec with a 60Hz  notched filter applied as appropriate. EEG data were recorded continuously and digitally stored.    Recoding duration : 27 m and 55 sec Activation included:  Photic stimulation and Hyperventilation .    Description: The EEG's posterior dominant background rhythm of 9 hertz was symmetrically displayed while the patient's eyes were closed  and promptly attenuated with eye opening. At baseline, the recording showed a moderately low amplitude in symmetric fashion.  Photic stimulation was initiated at frequencies from 3- through 21 hertz, resulting in photic entrainment at  7 hz and up to 21 hz  ,  without periodic or rhythmic discharges, or epileptiform activity.  Hyperventilation maneuver was initiated and the patient participated with good effort . The maneuver did not lead to amplitude build-up nor slowing .  Following hyperventilation, the patient's EEG was reviewed for a period of 1 and 2 minutes post maneuver. No sleep was recorded. .    The patient was recorded while awake.   ECG: heart rate at  62  BPM in regular rhythm.    IMPRESSION:  This EEG  is normal.   Dr. Dedra Gores, M.D. Accredited by the ABPN, ABSM.

## 2024-08-30 ENCOUNTER — Other Ambulatory Visit: Payer: Self-pay

## 2024-09-10 ENCOUNTER — Encounter: Payer: Self-pay | Admitting: Adult Health

## 2024-09-11 ENCOUNTER — Telehealth: Payer: Self-pay | Admitting: *Deleted

## 2024-09-11 NOTE — Telephone Encounter (Signed)
 [facility]  refill signed and faxed to [facility] . Received a receipt of confirmation.

## 2024-10-07 ENCOUNTER — Telehealth: Payer: Self-pay | Admitting: Physician Assistant

## 2024-10-07 NOTE — Telephone Encounter (Signed)
 Called and provided Dr. Calhoun DEA #.

## 2024-10-07 NOTE — Telephone Encounter (Signed)
 Pharmacy lvm at 10:48 stating that CC's DEA number is missing for controlled substance [name] 20mg . Pls rtc to give this information [phone] option 2 ref number [id number]

## 2024-10-07 NOTE — Telephone Encounter (Signed)
 Encounter not needed

## 2024-10-10 ENCOUNTER — Encounter: Payer: Self-pay | Admitting: Psychology

## 2024-10-15 ENCOUNTER — Encounter: Payer: Self-pay | Admitting: Adult Health

## 2024-10-15 ENCOUNTER — Ambulatory Visit: Payer: Self-pay | Admitting: Adult Health

## 2024-10-15 ENCOUNTER — Other Ambulatory Visit (HOSPITAL_COMMUNITY)
Admission: RE | Admit: 2024-10-15 | Discharge: 2024-10-15 | Disposition: A | Source: Ambulatory Visit | Attending: Adult Health | Admitting: Adult Health

## 2024-10-15 VITALS — BP 122/79 | HR 87 | Ht 60.0 in | Wt 141.0 lb

## 2024-10-15 DIAGNOSIS — Z01419 Encounter for gynecological examination (general) (routine) without abnormal findings: Secondary | ICD-10-CM | POA: Insufficient documentation

## 2024-10-15 NOTE — Progress Notes (Signed)
 Patient ID: Lauren [name], female   DOB: 06-30-1981, 43 y.o.   MRN: [id number] History of Present Illness: Lauren [name] is a 43 year old white female, married, G2P2, in for a well woman gyn exam and pap. She has lost 27 lbs since last visit going to [employer].  PCP is LULLA Pinal PA.   Current Medications, Allergies, Past Medical History, Past Surgical History, Family History and Social History were reviewed in [name] record.     Review of Systems: Patient denies any headaches, hearing loss, fatigue, blurred vision, shortness of breath, chest pain, abdominal pain, problems with bowel movements, urination, or intercourse. No joint pain or mood swings.  Gets bumps in vaginal area after sex and they resolve   Physical Exam:BP 122/79 (BP Location: Right Arm, Patient Position: Sitting, Cuff Size: Normal)   Pulse 87   Ht 5' (1.524 m)   Wt 141 lb (64 kg)   LMP 09/20/2024 (Exact Date)   BMI 27.54 kg/m   General:  Well developed, well nourished, no acute distress Skin:  Warm and dry Neck:  Midline trachea, normal thyroid , good ROM, no lymphadenopathy Lungs; Clear to auscultation bilaterally Breast:  No dominant palpable mass, retraction, or nipple discharge Cardiovascular: Regular rate and rhythm Abdomen:  Soft, non tender, no hepatosplenomegaly Pelvic:  External genitalia is normal in appearance, has tiny firm nodule top left labia, no tender, no induration.  The vagina is normal in appearance. Urethra has no lesions or masses. The cervix is bulbous,pap with HR HPV genotyping performed.  Uterus is felt to be normal size, shape, and contour.  No adnexal masses or tenderness noted.Bladder is non tender, no masses felt. Rectal: deferred Extremities/musculoskeletal:  No swelling or varicosities noted, no clubbing or cyanosis Psych:  No mood changes, alert and cooperative,seems happy AA is 0 Fall risk is low    10/15/2024    1:45 PM 10/11/2023    8:26 AM 12/21/2022    1:31 PM   Depression screen PHQ 2/9  Decreased Interest 0 0   Down, Depressed, Hopeless 0 0   PHQ - 2 Score 0 0   Altered sleeping 0 0   Tired, decreased energy 0 2   Change in appetite 0 1   Feeling bad or failure about yourself  0 0   Trouble concentrating 0 0   Moving slowly or fidgety/restless 0 0   Suicidal thoughts 0 0   PHQ-9 Score 0 3       Information is confidential and restricted. Go to Review Flowsheets to unlock data.   Data saved with a previous flowsheet row definition       10/15/2024    1:45 PM 10/11/2023    8:26 AM 12/21/2022    1:33 PM 08/25/2021    8:50 AM  GAD 7 : Generalized Anxiety Score  Nervous, Anxious, on Edge 0 2  1  Control/stop worrying 0 1  1  Worry too much - different things 0 2  1  Trouble relaxing 0 3  2  Restless 0 1  2  Easily annoyed or irritable 0 1  1  Afraid - awful might happen 0 0  1  Total GAD 7 Score 0 10  9  Anxiety Difficulty         Information is confidential and restricted. Go to Review Flowsheets to unlock data.      Upstream - 10/15/24 1341       Pregnancy Intention Screening   Does the patient  want to become pregnant in the next year? N/A    Does the patient's partner want to become pregnant in the next year? N/A    Would the patient like to discuss contraceptive options today? N/A      Contraception Wrap Up   Current Method Vasectomy    End Method Vasectomy    Contraception Counseling Provided No         Examination chaperoned by Clarita Salt LPN  Impression and plan: 1. Encounter for gynecological examination with Papanicolaou smear of cervix (Primary) Pap sent Pap in 3 years if normal Physical in 1 year Labs with PCP Mammogram was negative 10/23/23 and scheduled for 10/23/24 Colonoscopy was 03/15/23 due in 2029 Praised over weight loss with [employer] - Cytology - PAP( [place])

## 2024-10-16 ENCOUNTER — Ambulatory Visit: Admitting: Physician Assistant

## 2024-10-21 ENCOUNTER — Ambulatory Visit: Payer: Self-pay | Admitting: Adult Health

## 2024-10-21 ENCOUNTER — Ambulatory Visit: Admitting: Neurology

## 2024-10-21 LAB — CYTOLOGY - PAP
Comment: NEGATIVE
Diagnosis: NEGATIVE
High risk HPV: NEGATIVE

## 2024-10-23 ENCOUNTER — Ambulatory Visit
Admission: RE | Admit: 2024-10-23 | Discharge: 2024-10-23 | Disposition: A | Payer: Self-pay | Source: Ambulatory Visit | Attending: Adult Health | Admitting: Adult Health

## 2024-10-23 ENCOUNTER — Ambulatory Visit: Admitting: Physician Assistant

## 2024-10-23 ENCOUNTER — Encounter: Payer: Self-pay | Admitting: Physician Assistant

## 2024-10-23 DIAGNOSIS — F900 Attention-deficit hyperactivity disorder, predominantly inattentive type: Secondary | ICD-10-CM

## 2024-10-23 DIAGNOSIS — F329 Major depressive disorder, single episode, unspecified: Secondary | ICD-10-CM | POA: Diagnosis not present

## 2024-10-23 DIAGNOSIS — G2401 Drug induced subacute dyskinesia: Secondary | ICD-10-CM

## 2024-10-23 DIAGNOSIS — G4711 Idiopathic hypersomnia with long sleep time: Secondary | ICD-10-CM

## 2024-10-23 DIAGNOSIS — Z1231 Encounter for screening mammogram for malignant neoplasm of breast: Secondary | ICD-10-CM

## 2024-10-23 MED ORDER — AMPHETAMINE-DEXTROAMPHETAMINE 20 MG PO TABS: 20.0000 mg | ORAL_TABLET | Freq: Three times a day (TID) | ORAL | 0 refills | Status: DC

## 2024-10-23 NOTE — Progress Notes (Signed)
 [facility]  Patient ID: Lauren [name],  MRN: [id number]  PCP: Cleotilde, [place]  E, PA  Date of Evaluation: 10/23/2024 Time spent:25 minutes  Chief Complaint:  Chief Complaint   Depression; Anxiety; ADD; Follow-up    HISTORY/CURRENT STATUS: HPI For routine f/u  Klonopin  was added at LOV to help her relax to go to sleep.  But she wasn't able to get it b/c she's on Xywav .  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  She's able to work like normal. 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  Patient is able to enjoy things.  Energy and motivation are good.  No extreme sadness, tearfulness, or feelings of hopelessness. ADLs and personal hygiene are normal.  Appetite has not changed.  Weight is stable.  [name] is still beneficial, attention is good without easy distractibility.  Able to focus on things and finish tasks to completion.  No mania, delirium, AH/VH.  No SI/HI.  She asks about the abnl movements. Austedo  is still helpful, not having uncontrollable cramping in her feet which was one of the signs of TD leading to the Austedi Rx.  Occas has mouth movements, like running her tongue around her lips or pursing her lips.  These movements aren't causing any distress.  She tried Ingrezza in the past, no benefit.  Her Mom is going to see a movement disorder specialist and she asks about that.   Individual Medical History/ Review of Systems: Changes? :No   Past medications for mental health diagnoses include: Spravato for about 3 months (made her worse, not better)  took it 1-2 times a week.  Effexor , Lithium , [name], Xanax , elavil , and 'everything that's out there.' She can't name the meds.  Pristiq ,  Prozac , lamictal  caused rash, Latuda , Lithium , [name] , [name] ,Trazodone , Austedo , Ingrezza didn't help.   Allergies: Lamictal  [lamotrigine ], Doxycycline, Effexor  [venlafaxine ], Savella  [milnacipran hcl], and Prilosec [omeprazole ]  Current Medications:  Current Outpatient Medications:    AUSTEDO  XR 24 MG TB24, TAKE 2 TABLETS BY MOUTH 1 TIME A DAY, Disp: 180 tablet, Rfl: 1   Ca, Mg, K, and Na Oxybates (XYWAV ) 500 MG/ML SOLN, Take 4.5 G twice nightly, Disp: 540 mL, Rfl: 5   Cholecalciferol (VITAMIN D3) 125 MCG (5000 UT) CAPS, Take 1 capsule by mouth daily., Disp: , Rfl:    ferrous sulfate 325 (65 FE) MG tablet, Take 325 mg by mouth daily., Disp: , Rfl:    FOLIC ACID  PO, Take by mouth., Disp: , Rfl:    latanoprost (XALATAN) 0.005 % ophthalmic solution, 1 drop at bedtime., Disp: , Rfl:    levothyroxine  (SYNTHROID ) 125 MCG tablet, Take 100 mcg by mouth every morning., Disp: , Rfl:    metroNIDAZOLE -Cleanser 0.75 % CREAM KIT, Apply topically every morning., Disp: , Rfl:    polyethylene glycol powder (GLYCOLAX/MIRALAX) 17 GM/SCOOP powder, Take 17 g by mouth daily. Dissolve 1 capful (17g) in 4-8 ounces of liquid and take by mouth daily., Disp: , Rfl:    rosuvastatin  (CRESTOR ) 5 MG tablet, Take  1 tablet  Daily  for Cholesterol, Disp: 90 tablet, Rfl: 3   UNABLE TO FIND, BiPAP, Disp: , Rfl:    venlafaxine  XR (EFFEXOR -XR) 37.5 MG 24 hr capsule, Take 1 capsule (37.5 mg total) by mouth daily with breakfast. With 75 mg=112.5 mg daily, Disp: 90 capsule, Rfl: 1   venlafaxine  XR (EFFEXOR -XR) 75 MG 24 hr capsule, Take 1 capsule (75 mg total) by mouth every evening.  Take with 37.5 mg= 112.5 mg daily., Disp: 90 capsule, Rfl: 1   vitamin E 100 UNIT capsule, Take by mouth daily. Takes 180mg , Disp: , Rfl:    [START ON 01/04/2025] amphetamine -dextroamphetamine  ([name]) 20 MG tablet, Take 1 tablet (20 mg total) by mouth 3 (three) times daily., Disp: 90 tablet, Rfl: 0   [START ON 11/05/2024] amphetamine -dextroamphetamine  ([name]) 20 MG tablet, Take 1 tablet (20 mg total) by mouth 3 (three) times daily., Disp: 90 tablet, Rfl: 0   [START ON 12/05/2024] amphetamine -dextroamphetamine  ([name]) 20 MG  tablet, Take 1 tablet (20 mg total) by mouth 3 (three) times daily., Disp: 90 tablet, Rfl: 0 Medication Side Effects: none  Family Medical/ Social History: Changes? None.  MENTAL HEALTH EXAM:  Last menstrual period 09/20/2024.There is no height or weight on file to calculate BMI.  General Appearance: Casual and Well Groomed  Eye Contact:  Good  Speech:  Clear and Coherent and Normal Rate  Volume:  Normal  Mood:  Euthymic  Affect:  Congruent  Thought Process:  Goal Directed and Descriptions of Associations: Circumstantial  Orientation:  Full (Time, Place, and Person)  Thought Content: Logical   Suicidal Thoughts:  No  Homicidal Thoughts:  No  Memory:  WNL  Judgement:  Good  Insight:  Good  Psychomotor Activity:  No abnl movements  Concentration:  Concentration: Good and Attention Span: Good  Recall:  Good  Fund of Knowledge: Good  Language: Good  Assets:  Communication Skills Desire for Improvement Financial Resources/Insurance Housing Leisure Time Resilience Transportation Vocational/Educational  ADL's:  Intact  Cognition: WNL  Prognosis:  Good   See GeneSight results on chart  AIMS    Flowsheet Row Office Visit from 01/16/2024 in [facility]  AIMS Total Score 2   GAD-7    Flowsheet Row Office Visit from 10/15/2024 in [facility] at [facility] Office Visit from 10[facility]11/2023 in [facility] at [facility] Office Visit from 12/21/2022 in [facility] Office Visit from 08/25/2021 in [facility] at [facility]  Total GAD-7 Score 0 10 17 9    PHQ2-9    Flowsheet Row Office Visit from 10/15/2024 in [place] [facility] at [facility] Office Visit from 10/11/2023 in [facility] for [facility] at [facility] Office Visit from 12/21/2022 in [facility] Office Visit from  08/25/2021 in [facility] for [facility] at [facility] Office Visit from 03/05/2019 in [facility] at [facility]  PHQ-2 Total Score 0 0 1 0 2  PHQ-9 Total Score 0 3 -- 2 6   Flowsheet Row ED from 05/15/2021 in [facility] Emergency Department at [facility]  C-SSRS RISK CATEGORY No Risk   DIAGNOSES:    ICD-10-CM   1. Treatment-resistant depression  F32.9     2. Attention deficit hyperactivity disorder (ADHD), predominantly inattentive type  F90.0     3. Tardive dyskinesia  G24.01     4. 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  [name] filled 10/07/2024.  Xywav  filled 10/18/2024. I provided approximately 25 minutes of face to face time during this encounter, including time spent before and after the visit in records review, medical decision making, counseling pertinent to today's visit, and charting.   As far as her mental health meds  are concerned, she's doing well.  No changes are needed.  She asks about seeing a movement specialist.  She's already under the care of Neurology, Dr. Dedra Dohmeier and Harlene Bogaert, NP, for the Idiopathic Hypersomnia, but I'd like to get their opinion on the TD/Abnormal movements.  When I first met her in Jan 2024, she was already on Austedo  and had tried Ingrezza before that. She had been on Lithium  and Latuda  at some point which presumably led to the abnl movements, but there may have been other meds as well.  She's aware there aren't any other specific treatments for TD.  Cay is wonders if seeing a movement disorder specialist would be appropriate.   Continue [name] 20 mg,1 p.o. tid.  (Or 1.5 p.o. twice daily.)  Continue Austedo  XR 24 mg, 2 qd.       Continue Xywav  at bedtime.  Continue Effexor  XR 112.5 mg daily. Continue Bipap machine. Continue therapy. Return in 3 months.  Verneita Cooks, PA-C

## 2024-10-29 ENCOUNTER — Ambulatory Visit: Payer: Self-pay | Admitting: Adult Health

## 2024-10-30 ENCOUNTER — Telehealth: Payer: Self-pay

## 2024-10-30 ENCOUNTER — Other Ambulatory Visit: Payer: Self-pay | Admitting: Adult Health

## 2024-10-30 DIAGNOSIS — R928 Other abnormal and inconclusive findings on diagnostic imaging of breast: Secondary | ICD-10-CM

## 2024-10-30 NOTE — Telephone Encounter (Signed)
 PA approved Austedo  XR 24 mg 10/30/24-10/30/25 [employer]

## 2024-11-02 ENCOUNTER — Inpatient Hospital Stay: Admission: RE | Admit: 2024-11-02 | Discharge: 2024-11-02 | Attending: Adult Health | Admitting: Adult Health

## 2024-11-02 DIAGNOSIS — R928 Other abnormal and inconclusive findings on diagnostic imaging of breast: Secondary | ICD-10-CM

## 2024-11-04 ENCOUNTER — Ambulatory Visit: Payer: Self-pay | Admitting: Adult Health

## 2024-11-05 ENCOUNTER — Telehealth: Payer: Self-pay | Admitting: Physician Assistant

## 2024-11-05 ENCOUNTER — Other Ambulatory Visit: Payer: Self-pay

## 2024-11-05 DIAGNOSIS — F900 Attention-deficit hyperactivity disorder, predominantly inattentive type: Secondary | ICD-10-CM

## 2024-11-05 MED ORDER — AMPHETAMINE-DEXTROAMPHETAMINE 20 MG PO TABS: 20.0000 mg | ORAL_TABLET | Freq: Three times a day (TID) | ORAL | 0 refills | Status: AC

## 2024-11-05 NOTE — Telephone Encounter (Signed)
 [facility] called and said that on her adderall scripts to [facility] they need the medical code on all three of them to be filled

## 2024-11-05 NOTE — Telephone Encounter (Signed)
 Added Dx code and repended.

## 2024-11-06 ENCOUNTER — Ambulatory Visit: Payer: Self-pay | Admitting: Adult Health

## 2024-11-13 ENCOUNTER — Encounter: Admitting: Psychology

## 2024-11-13 ENCOUNTER — Encounter: Payer: Self-pay | Admitting: Psychology

## 2024-11-13 DIAGNOSIS — R4189 Other symptoms and signs involving cognitive functions and awareness: Secondary | ICD-10-CM | POA: Insufficient documentation

## 2024-11-13 NOTE — Progress Notes (Signed)
 "  NEUROPSYCHOLOGICAL EVALUATION [place]  Physical Medicine and Rehabilitation     Patient: Lauren [name]  DOB: September 12, 1981  Age: 43 y.o. Sex: female  Race/Ethnicity: White or Caucasian  Years of Ed.: 16  Referring Provider: Chalice Saunas, MD  Provider / Neuropsychologist: Evalene DOROTHA Riff, PsyD Date of Service: 11/13/24 Start: 8:00 AM End: 10:00 AM Location of Service:  [place]. [facility] Physical Medicine & Rehabilitation Department [address], [place], [place] [address] Individuals Present: Patient was seen unaccompanied, in-person, by the provider. 1 hour and 15 minutes spent in face-to-face clinical interview and remaining 45 minutes was spent in record review, documentation, and testing protocol construction.   Billing Code/Service: [id number] (1 Unit), [phone] (1 Unit)  PATIENT CONSENT AND CONFIDENTIALITY The patient's understanding of the reason for referral was intact. Discussed limits of confidentiality including, but not limited to, posting of final evaluation report in the patient's electronic medical record for both the patient and for the referring provider and appropriate medical professionals. Patient was given the opportunity to have their questions answered. The neuropsychological evaluation process was discussed with the patient and they consented to proceed with the evaluation.  Consent for Evaluation and Treatment: Signed: Yes Explanation of Privacy Policies: Signed: Yes Discussion of Confidentiality Limits: Yes  REASON FOR REFERRAL & RECORD REVIEW The patient was referred for neuropsychological evaluation by her neurologist, Dr. Chalice, due to concerns for declines in short term memory per 08/15/24 visit notes. Records indicate the patient described difficulties with memory in work and at home. Word finding was also endorsed. During her visit with Dr. Chalice, the patient presented the report of a  psychoeduational evaluation she underwent around 2000. In today's visit, she indicated she is trying to find the report, having forgotten where she placed it for safekeeping. She did provide the 504 plan that presumably resulted from the report. The patient will try and locate the original report and provide a copy to the writer when possible. The below information was obtained via records provided by the patient along with information obtained by Dr. Chalice during her recent follow-up visit.   Patient developmental history is notable for a speech and language delay. She was seen at [facility] at 43 years of age. Speech articulate errors were noted. She benefited from several years of speech and language therapy. The summary and impressions section of a 1988 neurodevelopmental evaluation with Dr. Marshall Forward was reviewed. Dr. Forward indicated absence of distraction/inattention behaviors, difficulties remembering 1-2 step instructions. She had difficulties with auditory verbal memory, and relative strengths in visual memory. Difficulties with speech articulation problems were noted. Motor planning and motor memory problems were present in the absence of fine motor skill deficits, accounting for her difficulties with writing speed. The report offered that problems with memory, difficulty following directions, and auditory-verbal comprehension was driven by auditory processing deficits rather than an attention deficit disorder. School accommodations were recommended to written output bypass strategies, visual cueing, preferential seating, extended time due to written output problems etc. Records indicated plan for her to return for psychoeducational testing. Summary and recommendations of that later testing stated cognitive functioning was within average/low average range, language naming deficits were seen, motor functioning were age-appropriate overall. She was reported to not meet criteria for formal placement in a  program for children with learning disability, but tutoring and speech and language therapy were recommended. 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 Difficulties with writing, analysis and synthesis, and oral response in timed condition in the class were reported areas of difficulty. Processing speed was reportedly reduced. Recommendations involved presentation of information in combination oral and written format. Increased or un-timed testing length, ability to audio record lectures, utilizing typing over written format for papers, and guidance/support for long term projects were also recommended.  Current diagnosis listed in the patient's medical chart include, but are not limited to, auditory processing disorder, OSA (biPAP compliant), idiopathic hypersomnia, fibromyalgia, major depressive disorder, hypertension, hyperlipidemia, and perimenopause.   Upon interview, the patient indicated she was interested in undergoing neuropsychological evaluation due to concerns for progressive worsening of premorbid cognitive deficits. She was particularly troubled by short-term memory problems and cited instances in which she failed to recall events such as conversations in which she took part.   HISTORY OF PRESENTING CONCERNS:  Cognitive Symptom Onset & Course:  Auditory processing difficulties and multiple other cognitive challenges have been life long. She reported a worsening of these issues starting around five years go. Declines were reportedly gradual in onset. Trajectory has been progressive, but with possible periods of stability (but without resolution). She could identify no clear precipitating event or cause. She did not some slight improvement in attention and concentration over the last year, correlating with improvements in concentration, but this did not improve other areas.   She described improvement  in her somnolence around five years ago related to treatment via neurology. She reported reduced intensity of sleepiness, but continued pattern in which at some point in the day she will suddenly feel very tired and this does not resolve until the next day. She endorsed declines in cognition during these periods. Cognitive problems were not limited to those periods, globally.   Current Cognitive Complaints:  Memory:  Endorsed declines and difficulties involving forgetfulness, working-memory/losing train of thought, difficulties remembering events such as conversations she reportedly had. Forgetting of events occur around once or twice per week. She also has difficulties with remembering details of conversations. She utilizes calendars and other compensatory strategies (wall calendar, phone reminders, pill organizer, written notes, to-do lists). No difficulties getting lost while driving.  Premorbid: Lifelong. Always a bit of struggle to remember details of conversations, but forgetting conversations themselves is relatively new. Processing Speed:  Denied noticing a worsening in last five years.  Premorbid: Lifelong Attention & Concentration: [name] has helped (started five years ago) I feel like a human.  Premorbid: Lifelong but milder and possibly fully accounted for by auditory processing.  Language:  Endorsed declines in word-finding, worse than premorbid difficulties. Struggle with understanding others. Sometimes I think I tune them out or lose track of what they are saying so I give up. She feels like this has worsened. Talking quickly makes it worse, multitasking while someone is talking is impossible, almost. No difference in volume or pitch.  Premorbid: Reading and writing LD. Auditory processing disorder potentially accounting for many language deficits. Always better with visual and combined visual and hearing it.    Visual-Spatial:  Patient: No problems with visual spatial  abilities.   Executive Functioning:  Organization and planning abilities have declined. Some difficulty with problem solving/decision making, although part of this is indecision secondary to anxiety. No indications of marked impulsivity. No recent personality or notable behavioral changes. Judgment / reasoning intact.  Premorbid: Organization, planning, problem solving efficiency.     Motor/Sensory Complaints:  Sensory changes: No changes sense smell or  taste. Gradual age changes in vision and risk for glaucoma. Hearing processing issues are present lifelong. Hearing sensitivity itself is fine.  Balance/coordination difficulties: Infrequent difficulty with balance. No frequent falls. Frequent instances of dizziness/vertigo: Brief episodes of dizziness every other week on average.  Other motor difficulties: No tremors. No other issues.   Emotional and Behavioral Functioning:  Depression Symptomatology: History of depression, started taking medications when in college. Currently; Not down/blue often, no anhedonia. No current SI. Passive SI in the 9th grade. Had some depression younger to. Has had episode of 2 weeks or more of low mood/sadness about 3 years ago. She is currently working with psychiatry and engaged in individual counseling. She has worked with mental health treatment previously and started on antidepressant medication in her early 43s. There was a period in which she may have been overmedication for depression. Saw many doctors, nothing would work. With one particular psychiatrist, she was on up to 5 medications of depression and ultimately prescribed ketamine. This episode was prior to diagnosis of idiopathic hypersomnia, and she indicated providers were viewing the symptoms as reflective of a depression.  She was then prescribed Ketamine, self administered, and inadvertently been taking too much regularly for several months. She discovered this only when an acquaintance, who used the  substance recreationally, learned of the amount that she was taken and explained that she was experiencing symptoms of intoxication. The patient stopped the medication and transferred to a new psychiatrist who she sees every six months for medication management. She sees her therapist every other week. She has been with her current therapist for around one year, who she transferred to after her previous therapist of 3 years moved.  Anxiety Symptomatology: Generalized anxiety symptoms are present, but reduced this year relative to severity levels over the past several. She has experienced panic attacks, but has had none since last year.  Other Symptomatology: No history of trauma, hallucination, paranoia, mania, no inpatient stays. No homicidal ideation. No recent suicidal ideation and no history of attempts.   Sleep: 7-7.5 hours per night. Sometimes will have trouble felling asleep (often but not most of the time). Wakes up during the night to take the second dose. Most of the time has a bit of trouble getting up in the morning. No unusual behavior during sleep.  Appetite: Struggles with weight. No disordered level difficulties.  Caffeine: Minimal.   Alcohol Use: No Tobacco Use: No Recreational Substance Use: No   Level of Functional Independence: The patient is intact with basic and instrumental activities of daily living.   Medical History/Record Review: Per records and patient report, History of traumatic brain injury/concussion: Maybe mild concussion at most 3 years ago.    History of stroke: No   History of heart attack: No History of cancer/chemotherapy: No   History of seizure activity: No   Symptoms of chronic pain: Occasional pain. Not severe when it happens.    Experience of frequent headaches/migraines: None    Imaging/Lab Results: (MRI Ordered but not completed)  Past Medical History:  Diagnosis Date   Anemia    Anxiety    Constipation    Depression    Fibromyalgia    GERD  (gastroesophageal reflux disease)    Glaucoma    Hemorrhoids    HSV-1 infection    Hypertension    Hypothyroidism    Idiopathic hypersomnia    Idiopathic hypersomnia    OSA on CPAP    Other abnormal glucose    Preeclampsia  Patient Active Problem List   Diagnosis Date Noted   Encounter for well woman exam with routine gynecological exam 10/11/2023   Perimenopause 10/11/2023   Involuntary movements 06/21/2023   Normal genital exam 04/06/2023   Idiopathic hypersomnia 12/21/2022   Treatment-emergent central sleep apnea 04/20/2021   Mild obstructive sleep apnea 05/21/2020   Hypersomnia, persistent 05/21/2020   Menorrhagia with irregular cycle 01/22/2020   Urine pregnancy test negative 01/22/2020   Uterine enlargement 01/22/2020   Major depressive disorder, recurrent episode, moderate (HCC) 12/03/2019   Carpal tunnel syndrome, bilateral 06/21/2019   Narcolepsy 04/10/2019   Liver lesion, left lobe 10/04/2018   Fibromyalgia 06/04/2018   Obstructive sleep apnea treated with continuous positive airway pressure (CPAP) 05/23/2018   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  Family History  Problem Relation Age of Onset   Depression Mother    Hypothyroidism Mother    Cervical cancer Mother    Bipolar disorder Mother    Diabetes Mother    Colon polyps Mother    Bipolar disorder Brother    Kidney disease Brother    Alcohol abuse Brother    Heart disease Maternal Aunt    Diabetes Maternal Aunt    Lung  cancer Maternal Grandfather    Heart disease Maternal Grandmother    Diabetes Maternal Grandmother    Cancer Maternal Grandmother        SKIN   Hyperlipidemia Maternal Grandmother    Hypertension Maternal Grandmother    Hypothyroidism Maternal Grandmother    Diabetes Cousin    Hypothyroidism Daughter    Colon cancer Neg Hx    Medications:  amphetamine -dextroamphetamine  ([name]) 20 MG tablet amphetamine -dextroamphetamine  ([name]) 20 MG tablet amphetamine -dextroamphetamine  ([name]) 20 MG tablet AUSTEDO  XR 24 MG TB24 Ca, Mg, K, and Na Oxybates (XYWAV ) 500 MG/ML SOLN Cholecalciferol (VITAMIN D3) 125 MCG (5000 UT) CAPS ferrous sulfate 325 (65 FE) MG tablet FOLIC ACID  PO latanoprost (XALATAN) 0.005 % ophthalmic solution levothyroxine  (SYNTHROID ) 125 MCG tablet metroNIDAZOLE -Cleanser 0.75 % CREAM KIT polyethylene glycol powder (GLYCOLAX/MIRALAX) 17 GM/SCOOP powder rosuvastatin  (CRESTOR ) 5 MG tablet UNABLE TO FIND venlafaxine  XR (EFFEXOR -XR) 37.5 MG 24 hr capsule venlafaxine  XR (EFFEXOR -XR) 75 MG 24 hr capsule vitamin E 100 UNIT capsule    Academic/Vocational History:  Mostly As Bs and some Cs growing up. I had to work very hard to make that happen. Bachelors degree.   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 She has worked in this field for around 20 years at various locations. She is pleased with her current employer, a school that is fully tailored for special ED. She described some difficulties in communication at work, but is performing well overall. Communication involves instructions provided to TA's. She indicated I think when I give instructions to people it must not come out right. This type of difficulty is not location or person specific.    Psychosocial: Marital Status: 20 years married Children/Grandchildren: 2 daughters. 15y, 19y, No grandkids.    Living Situation: 3 in the home.  Daily Activities/Hobbies:  Play games with the family.     Mental Status/Behavioral Observations: The patient was seen on an outpatient basis in the [facility] office for the clinical interview unaccompanied. Sensorium/Arousal: Alert. Vision is adequate for the purpose of planned cognitive evaluation. Hearing acuity is intact but auditory processing difficulties are present. She indicated that rapid speech is particularly problematic for her to process. Background noise can also be problematic.    Orientation: Intact    Appearance: Appropriate dress and hygiene.    Behavior: WNL   Speech/Language: Conversational speech was prosodic, fluent, and well-articulated.    Motor: Ambulated independently and without issue. No tremor.    Social Comportment: Appropriate for the setting.    Mood: Neutral to slightly anxious.    Affect: Congruent.    Thought Process/Content: Coherent, linear, goal directed.    Ability to Participate in Interview: Readily answered all questions posed during the clinical interview with adequate detail regarding personal history.    Insight: Good.     SUMMARY / CLINICAL IMPRESSIONS The patient was referred for neuropsychological evaluation by her neurologist, Dr. Chalice, due to concerns for declines in short term memory per 08/15/24 visit notes. Records indicate the patient described difficulties with memory in work and at home. Word finding was also endorsed. During her visit with Dr. Chalice, the patient presented the report of a psychoeduational evaluation she underwent around 2000. In today's visit, she indicated she is trying to find the report, having forgotten where she placed it for safekeeping. She did provide the 504 plan that presumably resulted from the report. The patient will try and locate the original report and provide a copy to the writer when possible. The below information was obtained via records provided by the patient along with information obtained by Dr. Chalice during her  recent follow-up visit.   Patient developmental history is notable for a speech and language delay. She was seen at [facility] at 43 years of age. Speech articulate errors were noted. She benefited from several years of speech and language therapy. The summary and impressions section of a 1988 neurodevelopmental evaluation with Dr. Marshall Forward was reviewed. Dr. Forward indicated absence of distraction/inattention behaviors, difficulties remembering 1-2 step instructions. She had difficulties with auditory verbal memory, and relative strengths in visual memory. Difficulties with speech articulation problems were noted. Motor planning and motor memory problems were present in the absence of fine motor skill deficits, accounting for her difficulties with writing speed. The report offered that problems with memory, difficulty following directions, and auditory-verbal comprehension was driven by auditory processing deficits rather than an attention deficit disorder. School accommodations were recommended to written output bypass strategies, visual cueing, preferential seating, extended time due to written output problems etc. Records indicated plan for her to return for psychoeducational testing. Summary and recommendations of that later testing stated cognitive functioning was within average/low average range, language naming deficits were seen, motor functioning were age-appropriate overall. She was reported to not meet criteria for formal placement in a program for children with learning disability, but tutoring and speech and language therapy were recommended. 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 Difficulties with writing, analysis and synthesis, and oral response in timed condition in the class were reported areas of difficulty. Processing speed was reportedly reduced. Recommendations involved presentation of  information in combination oral and written format. Increased or un-timed testing length, ability to audio record lectures,  utilizing typing over written format for papers, and guidance/support for long term projects were also recommended.  Current diagnosis listed in the patient's medical chart include, but are not limited to, auditory processing disorder, OSA (biPAP compliant), idiopathic hypersomnia, fibromyalgia, major depressive disorder, hypertension, hyperlipidemia, and perimenopause.   Upon interview, the patient indicated she was interested in undergoing neuropsychological evaluation due to concerns for progressive worsening of premorbid cognitive deficits. She was particularly troubled by short-term memory problems and cited instances in which she failed to recall events such as conversations in which she took part. Formal cognitive evaluation is indicated given the patient's neurological history and concerns for cognitive decline. Results will guide differential diagnosis and treatment recommendations. Cognitive strategies and recommendations will be tailored to the patient's cognitive profile.   If full copy of the 2000 psychoeducation evaluation is located by the patient, comparison of performances will be conducted. Timing of the testing itself was discussed with the patient and a plan made involving flexibility in testing session days to reduce potential interference from hypersomnia.   DISPOSITION / PLAN The patient has been set up for a formal neuropsychological assessment to objectively assess her cognitive functioning across domains to establish the patient's cognitive profile. This data, in conjunction with information obtained via clinical interview and medical record review, will help clarify likely etiology and guide treatment recommendations. Once data collection and interpretation have been completed, the findings / diagnosis and recommendations will be reviewed and discussed with  the patient during a feedback appointment with the neuropsychologist. Based on the collaborative dialogue with the patient during the feedback, recommendations may be adjusted / tailored as needed. A formal report will be produced and provided to the patient and the referring provider.   Diagnosis: Cognitive Changes  Per Hx: Hypersomnia, persistent [G47.10 (ICD-10-CM)] Auditory processing disorder [Y06.74 (ICD-10-CM)]  FULL REPORT TO FOLLOW   Evalene DOROTHA Riff, PsyD [facility] [address] [place], [place] [address] Main: [phone] Fax: [phone] [place] License # [id number]  This report was generated using voice recognition software. While this document has been carefully reviewed, transcription errors may be present. I apologize in advance for any inconvenience. Please contact me if further clarification is needed.  "

## 2024-11-15 ENCOUNTER — Encounter: Payer: Self-pay | Admitting: Adult Health

## 2024-11-19 ENCOUNTER — Encounter

## 2024-11-19 DIAGNOSIS — R4189 Other symptoms and signs involving cognitive functions and awareness: Secondary | ICD-10-CM

## 2024-11-19 NOTE — Progress Notes (Signed)
" ° °  Mental Status/Behavioral Observations (11/19/2024):  Orientation: The patient was oriented to self and place. She was oriented to time with the exception of the exact day of the month (off by 1 day). Sensory/Arousal: Hearing and vision were adequate for the demands of testing. The patient was alert. Appearance: Dress and hygiene were appropriate for the setting.  Speech/Language: In conversation, the patient's speech was prosodic, fluent, and well-articulated. The patient displayed no indications of word finding difficulties and no word substitution errors were observed.  Motor: The patient ambulated independently and without issue. No tremors were observed.  Social Comportment: Social behavior was appropriate for the setting. Mood/Affect: Mood was largely neutral to potentially anxious at times. Affect was congruent with mood.  Attention/Concentration:  The patient appeared to maintain consistent engagement throughout the testing session. No frank attentional lapses were observed.  Thought Process/Content: The patient's thought process was coherent, linear, goal directed. There were no indications of psychosis.  Additional Observations: The patient showed no difficulties with understanding task instructions. Minimal difficulties with frustration tolerance were noted.  Neuropsychology Note Aalyssa Elderkin Seehafer completed 105 minutes of neuropsychological testing with technician, Josue Ned, BA, under the supervision of Evalene Riff, PsyD., Clinical Neuropsychologist. The patient did not appear overtly distressed by the testing session, per behavioral observation or via self-report to the technician. Rest breaks were offered.   Clinical Decision Making: In considering the patient's current level of functioning, level of presumed impairment, nature of symptoms, emotional and behavioral responses during clinical interview, level of literacy, and observed level of motivation/effort, a battery of  tests was selected by Dr. Riff during initial consultation on 11/13/2024. This was communicated to the technician. Communication between the neuropsychologist and technician was ongoing throughout the testing session and changes were made as deemed necessary based on patient performance on testing, technician observations and additional pertinent factors such as those listed above.  Tests Administered: Brief Visuospatial Memory Test-Revised (BVMT-R) [place]  Verbal Learning Test-Third Edition (CVLT-3) Conners Continuous Performance Test 3rd Edition (CPT-3) Controlled Oral Word Association Test (FAS & Animals) Delis-Kaplan Executive Function System (D-KEFS), select subtests Wechsler Adult Intelligence Scale-Fourth Edition (WAIS-IV), select subtests Wechsler Abbreviated Scale of Intelligence (WASI-II) Wechsler Memory Scale-Third Edition (WMS-III), select subtests  [place]  Card Sorting Test [phone]) The Beck Depression Inventory-II (BDI-II) Beck Anxiety Inventory (BAI)   Results: Note: This summary of test scores accompanies the interpretive report and should not be interpreted by unqualified individuals or in isolation without reference to the report. Test scores are relative to age, gender, and educational history as available and appropriate. Measurement properties of test scores: IQ, Index, and Standard Scores (SS): Mean = 100; Standard Deviation = 15; Scaled Scores (ss): Mean = 10; Standard Deviation = 3; Z scores (Z): Mean = 0; Standard Deviation = 1; T scores (T); Mean = 50; Standard Deviation = 10  *Results will be included in note from 2nd testing session on 12/02/2024.   Feedback to Patient: Freyja Govea will return on 12/09/2024 for an interactive feedback session with Dr. Riff at which time her test performances, clinical impressions and treatment recommendations will be reviewed in detail. The patient understands she can contact our office should she require our  assistance before this time.  105 minutes spent face-to-face with patient administering standardized tests, 15 minutes spent scoring radiographer, therapeutic). [CPT [id number], [id number]]  Full report to follow. "

## 2024-11-28 ENCOUNTER — Telehealth: Payer: Self-pay

## 2024-12-02 ENCOUNTER — Encounter: Attending: Psychology

## 2024-12-02 DIAGNOSIS — R4189 Other symptoms and signs involving cognitive functions and awareness: Secondary | ICD-10-CM | POA: Insufficient documentation

## 2024-12-02 NOTE — Progress Notes (Signed)
 "  Testing was completed over the course of two visits due to test length and validity considerations. Below results / report includes results from the two visits. CPT Codes/Units combined, applied to, and reported within Day 1 encounter (addendum made), and [place] code applied here.  TESTING DAY 1 (11-19-24) 105 minutes spent face-to-face with patient administering standardized tests, 15 minutes spent scoring (technician). [CPT [id number], [id number]  TESTING DAY 2 (12-02-24)120 minutes spent face-to-face with patient administering standardized tests, 60 minutes spent scoring (technician). [[id number]]   Mental Status/Behavioral Observations (12/02/2024):  Orientation: The patient was oriented to self, place, and time. Sensory/Arousal: Hearing and vision were adequate for the demands of testing. The patient was alert. Appearance: Dress and hygiene were appropriate for the setting.  Speech/Language: In conversation, the patient's speech was prosodic, fluent, and well-articulated. The patient displayed no indications of word finding difficulties and no word substitution errors were observed.  Motor: The patient ambulated independently and without issue. No tremors were observed.  Social Comportment: Social behavior was appropriate for the setting. Mood/Affect: Mood was largely neutral to potentially anxious at times. Affect was congruent with mood.  Attention/Concentration:  The patient appeared to maintain consistent engagement throughout the testing session. No frank attentional lapses were observed.  Thought Process/Content: The patient's thought process was coherent, linear, goal directed. There were no indications of psychosis.  Additional Observations: The patient showed no difficulties with understanding task instructions. Minimal difficulties with frustration tolerance were noted.  Neuropsychology Note Ettamae Barkett Wanzer completed 120 minutes of neuropsychological testing with technician, Josue Ned, BA, under the supervision of Evalene Riff, PsyD., Clinical Neuropsychologist. The patient did not appear overtly distressed by the testing session, per behavioral observation or via self-report to the technician. Rest breaks were offered.   Clinical Decision Making: In considering the patient's current level of functioning, level of presumed impairment, nature of symptoms, emotional and behavioral responses during clinical interview, level of literacy, and observed level of motivation/effort, a battery of tests was selected by Dr. Riff during initial consultation on 11/13/2024. This was communicated to the technician. Communication between the neuropsychologist and technician was ongoing throughout the testing session and changes were made as deemed necessary based on patient performance on testing, technician observations and additional pertinent factors such as those listed above.  Tests Administered: [place] Judgment of Line Orientation (JOLO) Clock Drawing Test Neuropsychological Assessment Battery (NAB), select subtest Rey Complex Figure Test (RCFT), select subtests Wechsler Adult Intelligence Scale-Fourth Edition (WAIS-IV), select subtests Wechsler Abbreviated Scale of Intelligence (WASI-II) Wechsler Memory Scale-Fourth Edition (WMS-IV) , select subtests The Beck Depression Inventory-II (BDI-II) Beck Anxiety Inventory (BAI)   NEUROPSYCHOLOGICAL TEST RESULTS: Note: This summary of test scores accompanies the interpretive report and should not be interpreted by unqualified individuals or in isolation without reference to the report.   Test scores are relative to age and further adjusted for educational history and demographics as available when appropriate.  Measurement properties of test scores: IQ, Index, and Standard Scores (SS): Mean = 100; Standard Deviation = 15; Scaled Scores (ss): Mean = 10; Standard Deviation = 3; Z scores (Z): Mean = 0; Standard Deviation = 1; T scores (T);  Mean = 50; Standard Deviation = 10  *=Administered on 12/30   Intellectual/Premorbid Functioning Estimate   Norm Score Percentile  Range  WASI-II FSIQ-4  SS = 97 42 %ile Average   Vocabulary   T = 51 53 %ile Average  * Matrix Reasoning   T = 40 16 %ile Low Average  Block Design  T = 60 84 %ile High Average   Similarities  T = 48 42 %ile Average   ATTENTION AND WORKING MEMORY    Norm Score Percentile  Range  *WAIS-IV          Digit Span  ss = 7 16 %ile Low Average   DSF  ss = 6 9 %ile Low Average   Span:    5      DSB  ss = 9 37 %ile Average   Span:    4      DSS  ss = 7 16 %ile Low Average   Span:    4     *WMS-III          Spatial Span  ss = 11 63 %ile Average   SSF  ss = 13 84 %ile High Average   Span:    6      SSB  ss = 9 37 %ile Average   Span:    4      COMPLEX ATTENTION    Norm Score Percentile  Descriptor  *CPT-3          Response Style          Detectability (reverse scored)  T = 42 24 %ile    Omission Errors  T = 46 39 %ile    Commission Errors  T = 41 14 %ile    Perseverations  T = 46 38 %ile    HRT (reaction time)  T = 47 42 %ile    HRT SD (reaction time consistency) T = 39 6 %ile    Variability (in RT consistency)  T = 40 8 %ile    HRT Block Change (over test)  T = 38 9 %ile    HRT ISI Change (by stimuli interval) T = 51 55 %ile    PROCESSING SPEED    Norm Score Percentile  Range  WAIS-IV          Coding  ss = 10 50 %ile Average   Symbol Search  ss = 11 63 %ile Average   LANGUAGE    Norm Score Percentile  Range  NAB Naming  T = 52 58 %ile Average  COWAT          FAS  T = 54 66 %ile Average   Animals  T = 47 37 %ile Average   EXECUTIVE FUNCTIONING    Norm Score Percentile  Range  *DKEFS - Color-Word Interference          Color Naming  ss = 6 9 %ile Low Average   Word Reading  ss = 6 9 %ile Low Average   Inhibition  ss = 11 63 %ile Average   Errors  ss = 12 75 %ile High Average   Inhibition Switching  ss = 10 50 %ile Average   Errors  ss = 12 75  %ile High Average  DKEFS - Trails          Condition 1  ss = 7 16 %ile Low Average   Condition II  ss = 10 50 %ile Average   Condition III  ss = 10 50 %ile Average   Condition IV  ss = 10 50 %ile Average   Condition V  ss = 12 75 %ile High Average  *[place]  Card Sorting Test (WCST)          Total Errors  T = 53 61 %ile Average   Perseverative Errors  T = 50 50 %ile Average   Non-Perseverative Errors  T = 53 63 %ile Average   % Conceptual Responses  T = 54 66 %ile Average   Categories Completed     >16 %ile WNL   Trials to First Category Complete     >16 %ile WNL   Failure to Maintain Set     >16 %ile WNL   MEMORY    Norm Score Percentile  Range  *BVMT-R          Trial 1  T = 34.0 5 %ile Below Average   Trial 2  T = 41 18 %ile Low Average   Trial 3  T = 51.0 53 %ile Average   Total Recall  T = 40.0 16 %ile Low Average   Learning  T = 69.0 97 %ile Above Average   Delayed Recall  T = 53.0 61 %ile Average   % Retained    100 >16 %ile WNL   Hits     6-10 %ile Low to Below Average   False Alarms     >16 %ile WNL   Recognition Discriminability     11-16 %ile Low Average  *CVLT-III          Trial 1  ss = 7.0 16 %ile Low Average   Trial 2  ss = 8.0 25 %ile Average   Trial 3  ss = 11.0 63 %ile Average   Trial 4  ss = 10.0 50 %ile Average   Trial 5  ss = 8.0 25 %ile Average   Trial B  ss = 10.0 50 %ile Average   Short Delay Free Recall  ss = 11.0 63 %ile Average   Short Delay Cued Recall  ss = 10.0 50 %ile Average   Long Delay Free Recall  ss = 11.0 63 %ile Average   Long Delay Cued Recall  ss = 10.0 50 %ile Average   Total Hits  ss = 13.0 84 %ile High Average   Total False Positives  ss = 8.0 25 %ile Average   Recognition Discriminability  ss = 10.0 50 %ile Average   Total Intrusions  ss = 6.0 9 %ile Low Average   Trials 1-5 Total Correct  SS = 93 32 %ile Average   Total Repetitions  ss = 13.0 84 %ile High Average   List B vs. Trial 1  ss = 12.0 75 %ile High Average   SD (FR)  vs. Trial 5 Correct  ss = 13.0 84 %ile High Average   LD (FR)vs. SD (FR)  ss = 11.0 63 %ile Average  Wechsler Memory Scale, 4th Edition (WMS-4)         Log. Mem. Immediate Recall  ss = 9 37 %ile Average   Logical Memory Delayed Recall  ss = 10 50 %ile Average   Logical Recognition    17th-25th  %ile Average to Low Average   VISUAL-SPATIAL    Norm Score Percentile  Range  WASI-II          Block Design  T = 60 84 %ile High Average   *Matrix Reasoning  T = 40 16 %ile Low Average  [place] JOLO     72 %ile Average  Rey Complex Figure Copy       >16 %ile WNL  Clock          PERSONALITY AND BEHAVIORAL FUNCTIONING      Score/Interpretation  *BDI Raw- 12/30  2  BDI Severity       Minimal.  *BAI Raw- 12/30        9  BAI Severity       Mild.  BDI Raw- 1/12        7  BDI Severity       Minimal.  BAI Raw- 1/12        8  BAI Severity       Mild.    Feedback to Patient: Tracey Hermance will return on 12/09/2024 for an interactive feedback session with Dr. Hayden at which time her test performances, clinical impressions and treatment recommendations will be reviewed in detail. The patient understands she can contact our office should she require our assistance before this time.    Full report to follow. "

## 2024-12-05 ENCOUNTER — Encounter: Admitting: Psychology

## 2024-12-09 ENCOUNTER — Encounter: Payer: Self-pay | Attending: Psychology | Admitting: Psychology

## 2024-12-09 NOTE — Progress Notes (Incomplete)
" ° °  NEUROPSYCHOLOGICAL EVALUATION [place]. [facility]  Physical Medicine and Rehabilitation     Patient: Lauren [name]  MRN: [id number] DOB: [birth year]   Service Provider/Clinical Neuropsychologist: Evalene DOROTHA Riff, PsyD  Date of Service: 12/09/24 Start Time: 10 AM End Time: 11 PM  Location of Service:  [facility] Physical Medicine & Rehabilitation Department [place]. [facility] [address] [place] [place], [place] [address] Phone: [phone]   Billing Code/Service: -     Individuals present: Patient, ***, Provider Laurier DOROTHA Riff, PsyD)  Provider conducted the 13-minute interactive feedback appointment in-person with the patient ***.  The provider reviewed and discussed the results of neuropsychological evaluation. Follow-up interviewing was conducted as needed to refine interpretation of findings as needed. Review of results included overall findings, diagnosis, and treatment planning/recommendations that were derived from integration of patient data, interpretation of standardized rest results and clinical data, and clinical decision making, which are documented in the patient's electronic medical record with the full report (date listed below). A copy of the full report will also be mailed to the patient.   The patient expressed understanding of the information reviewed. The patient was provided opportunity to ask questions which were then answered by the provider. The provider worked collaboratively to tailor treatment recommendations to the patient when possible. The patient was informed they could reach out to the provider should additional questions related to the evaluation arise.    The final neuropsychological evaluation report, documented in the patient's chart on ***, was amended to reflect any additional information obtained during the feedback appointment including treatment planning collaboration.    Evalene DOROTHA Riff, PsyD [facility]  PM[facility]-Clinical Neuropsychology [address], [address] [place], [place] [address] Main: [phone] Fax: [phone] [place] License # [id number]  This report was generated using voice recognition software. While this document has been carefully reviewed, transcription errors may be present. I apologize in advance for any inconvenience. Please contact me if further clarification is needed.  "

## 2024-12-09 NOTE — Progress Notes (Incomplete)
 "  NEUROPSYCHOLOGICAL EVALUATION [place]  Physical Medicine and Rehabilitation     Patient: Lauren [name]  DOB: Dec 26, 1980  Age: 44 y.o. Sex: female  Race/Ethnicity: White or Caucasian  Years of Ed.: 16  Referring Provider: Cleotilde Johnna BRAVO, PA  Provider / Neuropsychologist: Evalene DOROTHA Riff, PsyD Date of Service: 12/05/24 Start: 8:00 AM End: 9:00 AM Location of Service:  [place]. [facility] Physical Medicine & Rehabilitation Department [address], [address], [place], [place] [address] Billing Code/Service: [phone]  Individuals Present: Evalene Riff, PsyD 1 hour was spent on interpretation of patient data, interpretation of standardized test results and clinical data, clinical decision making, initial treatment planning/recommendations, and report writing. The report will be amended as needed based on any additional information collected during interactive feedback session.   REASON FOR REFERRAL & RECORD REVIEW The patient was referred for neuropsychological evaluation by her neurologist, Dr. Chalice, due to concerns for declines in short term memory per 08/15/24 visit notes. Records indicate the patient described difficulties with memory in work and at home. Word finding was also endorsed. During her visit with Dr. Chalice, the patient presented the report of a psychoeduational evaluation she underwent around 2000. In today's visit, she indicated she is trying to find the report, having forgotten where she placed it for safekeeping. She did provide the 504 plan that presumably resulted from the report. The patient will try and locate the original report and provide a copy to the writer when possible. The below information was obtained via records provided by the patient along with information obtained by Dr. Chalice during her recent follow-up visit.   Patient developmental history is notable for a speech and language delay. She was  seen at [facility] at 44 years of age. Speech articulate errors were noted. She benefited from several years of speech and language therapy. The summary and impressions section of a 1988 neurodevelopmental evaluation with Dr. Marshall Forward was reviewed. Dr. Forward indicated absence of distraction/inattention behaviors, difficulties remembering 1-2 step instructions. She had difficulties with auditory verbal memory, and relative strengths in visual memory. Difficulties with speech articulation problems were noted. Motor planning and motor memory problems were present in the absence of fine motor skill deficits, accounting for her difficulties with writing speed. The report offered that problems with memory, difficulty following directions, and auditory-verbal comprehension was driven by auditory processing deficits rather than an attention deficit disorder. School accommodations were recommended to written output bypass strategies, visual cueing, preferential seating, extended time due to written output problems etc. Records indicated plan for her to return for psychoeducational testing. Summary and recommendations of that later testing stated cognitive functioning was within average/low average range, language naming deficits were seen, motor functioning were age-appropriate overall. She was reported to not meet criteria for formal placement in a program for children with learning disability, but tutoring and speech and language therapy were recommended. 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 Difficulties with writing, analysis and synthesis, and oral response in timed condition in the class were reported areas of difficulty. Processing speed was reportedly reduced. Recommendations involved presentation of information in combination oral and written format. Increased or un-timed testing length, ability to audio record  lectures, utilizing typing over written format for papers, and guidance/support for long term projects were also recommended.  Current diagnosis listed in the patient's medical chart include, but are not limited  to, auditory processing disorder, OSA (biPAP compliant), idiopathic hypersomnia, fibromyalgia, major depressive disorder, hypertension, hyperlipidemia, and perimenopause.   Upon interview, the patient indicated she was interested in undergoing neuropsychological evaluation due to concerns for progressive worsening of premorbid cognitive deficits. She was particularly troubled by short-term memory problems and cited instances in which she failed to recall events such as conversations in which she took part.   HISTORY OF PRESENTING CONCERNS:  Cognitive Symptom Onset & Course:  Auditory processing difficulties and multiple other cognitive challenges have been life long. She reported a worsening of these issues starting around five years go. Declines were reportedly gradual in onset. Trajectory has been progressive, but with possible periods of stability (but without resolution). She could identify no clear precipitating event or cause. She did not some slight improvement in attention and concentration over the last year, correlating with improvements in concentration, but this did not improve other areas.   She described improvement in her somnolence around five years ago related to treatment via neurology. She reported reduced intensity of sleepiness, but continued pattern in which at some point in the day she will suddenly feel very tired and this does not resolve until the next day. She endorsed declines in cognition during these periods. Cognitive problems were not limited to those periods, globally.   Current Cognitive Complaints:  Memory:  Endorsed declines and difficulties involving forgetfulness, working-memory/losing train of thought, difficulties remembering events such as conversations  she reportedly had. Forgetting of events occur around once or twice per week. She also has difficulties with remembering details of conversations. She utilizes calendars and other compensatory strategies (wall calendar, phone reminders, pill organizer, written notes, to-do lists). No difficulties getting lost while driving.  Premorbid: Lifelong. Always a bit of struggle to remember details of conversations, but forgetting conversations themselves is relatively new. Processing Speed:  Denied noticing a worsening in last five years.  Premorbid: Lifelong Attention & Concentration: [name] has helped (started five years ago) I feel like a human.  Premorbid: Lifelong but milder and possibly fully accounted for by auditory processing.  Language:  Endorsed declines in word-finding, worse than premorbid difficulties. Struggle with understanding others. Sometimes I think I tune them out or lose track of what they are saying so I give up. She feels like this has worsened. Talking quickly makes it worse, multitasking while someone is talking is impossible, almost. No difference in volume or pitch.  Premorbid: Reading and writing LD. Auditory processing disorder potentially accounting for many language deficits. Always better with visual and combined visual and hearing it.    Visual-Spatial:  Patient: No problems with visual spatial abilities.   Executive Functioning:  Organization and planning abilities have declined. Some difficulty with problem solving/decision making, although part of this is indecision secondary to anxiety. No indications of marked impulsivity. No recent personality or notable behavioral changes. Judgment / reasoning intact.  Premorbid: Organization, planning, problem solving efficiency.     Motor/Sensory Complaints:  Sensory changes: No changes sense smell or taste. Gradual age changes in vision and risk for glaucoma. Hearing processing issues are present lifelong. Hearing  sensitivity itself is fine.  Balance/coordination difficulties: Infrequent difficulty with balance. No frequent falls. Frequent instances of dizziness/vertigo: Brief episodes of dizziness every other week on average.  Other motor difficulties: No tremors. No other issues.   Emotional and Behavioral Functioning:  Depression Symptomatology: History of depression, started taking medications when in college. Currently; Not down/blue often, no anhedonia. No current SI. Passive SI in the  9th grade. Had some depression younger to. Has had episode of 2 weeks or more of low mood/sadness about 3 years ago. She is currently working with psychiatry and engaged in individual counseling. She has worked with mental health treatment previously and started on antidepressant medication in her early 85s. There was a period in which she may have been overmedication for depression. Saw many doctors, nothing would work. With one particular psychiatrist, she was on up to 5 medications of depression and ultimately prescribed ketamine. This episode was prior to diagnosis of idiopathic hypersomnia, and she indicated providers were viewing the symptoms as reflective of a depression.  She was then prescribed Ketamine, self administered, and inadvertently been taking too much regularly for several months. She discovered this only when an acquaintance, who used the substance recreationally, learned of the amount that she was taken and explained that she was experiencing symptoms of intoxication. The patient stopped the medication and transferred to a new psychiatrist who she sees every six months for medication management. She sees her therapist every other week. She has been with her current therapist for around one year, who she transferred to after her previous therapist of 3 years moved.  Anxiety Symptomatology: Generalized anxiety symptoms are present, but reduced this year relative to severity levels over the past several. She has  experienced panic attacks, but has had none since last year.  Other Symptomatology: No history of trauma, hallucination, paranoia, mania, no inpatient stays. No homicidal ideation. No recent suicidal ideation and no history of attempts.   Sleep: 7-7.5 hours per night. Sometimes will have trouble felling asleep (often but not most of the time). Wakes up during the night to take the second dose. Most of the time has a bit of trouble getting up in the morning. No unusual behavior during sleep.  Appetite: Struggles with weight. No disordered level difficulties.  Caffeine: Minimal.   Alcohol Use: No Tobacco Use: No Recreational Substance Use: No   Level of Functional Independence: The patient is intact with basic and instrumental activities of daily living.   Medical History/Record Review: Per records and patient report, History of traumatic brain injury/concussion: Maybe mild concussion at most 3 years ago.    History of stroke: No   History of heart attack: No History of cancer/chemotherapy: No   History of seizure activity: No   Symptoms of chronic pain: Occasional pain. Not severe when it happens.    Experience of frequent headaches/migraines: None    Imaging/Lab Results: (MRI Ordered but not completed)  Past Medical History:  Diagnosis Date   Anemia    Anxiety    Constipation    Depression    Fibromyalgia    GERD (gastroesophageal reflux disease)    Glaucoma    Hemorrhoids    HSV-1 infection    Hypertension    Hypothyroidism    Idiopathic hypersomnia    Idiopathic hypersomnia    OSA on CPAP    Other abnormal glucose    Preeclampsia    Patient Active Problem List   Diagnosis Date Noted   Encounter for well woman exam with routine gynecological exam 10/11/2023   Perimenopause 10/11/2023   Involuntary movements 06/21/2023   Normal genital exam 04/06/2023   Idiopathic hypersomnia 12/21/2022   Treatment-emergent central sleep apnea 04/20/2021   Mild obstructive sleep apnea  05/21/2020   Hypersomnia, persistent 05/21/2020   Menorrhagia with irregular cycle 01/22/2020   Urine pregnancy test negative 01/22/2020   Uterine enlargement 01/22/2020   Major depressive  disorder, recurrent episode, moderate (HCC) 12/03/2019   Carpal tunnel syndrome, bilateral 06/21/2019   Narcolepsy 04/10/2019   Liver lesion, left lobe 10/04/2018   Fibromyalgia 06/04/2018   Obstructive sleep apnea treated with continuous positive airway pressure (CPAP) 05/23/2018   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  Family History  Problem Relation Age of Onset   Depression Mother    Hypothyroidism Mother    Cervical cancer Mother    Bipolar disorder Mother    Diabetes Mother    Colon polyps Mother    Bipolar disorder Brother    Kidney disease Brother    Alcohol abuse Brother    Heart disease Maternal Aunt    Diabetes Maternal Aunt    Lung cancer Maternal Grandfather    Heart disease Maternal Grandmother    Diabetes Maternal Grandmother    Cancer Maternal Grandmother        SKIN   Hyperlipidemia Maternal Grandmother    Hypertension Maternal Grandmother    Hypothyroidism Maternal Grandmother    Diabetes Cousin    Hypothyroidism Daughter    Colon cancer Neg Hx    Medications:  amphetamine -dextroamphetamine  ([name]) 20 MG tablet amphetamine -dextroamphetamine  ([name]) 20 MG tablet amphetamine -dextroamphetamine  ([name]) 20 MG tablet AUSTEDO  XR 24 MG TB24 Ca, Mg, K, and Na Oxybates (XYWAV ) 500  MG/ML SOLN Cholecalciferol (VITAMIN D3) 125 MCG (5000 UT) CAPS ferrous sulfate 325 (65 FE) MG tablet FOLIC ACID  PO latanoprost (XALATAN) 0.005 % ophthalmic solution levothyroxine  (SYNTHROID ) 125 MCG tablet metroNIDAZOLE -Cleanser 0.75 % CREAM KIT polyethylene glycol powder (GLYCOLAX/MIRALAX) 17 GM/SCOOP powder rosuvastatin  (CRESTOR ) 5 MG tablet UNABLE TO FIND venlafaxine  XR (EFFEXOR -XR) 37.5 MG 24 hr capsule venlafaxine  XR (EFFEXOR -XR) 75 MG 24 hr capsule vitamin E 100 UNIT capsule    Academic/Vocational History:  Mostly As Bs and some Cs growing up. I had to work very hard to make that happen. Bachelors degree.   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 She has worked in this field for around 20 years at various locations. She is pleased with her current employer, a school that is fully tailored for special ED. She described some difficulties in communication at work, but is performing well overall. Communication involves instructions provided to TA's. She indicated I think when I give instructions to people it must not come out right. This type of difficulty is not location or person specific.    Psychosocial: Marital Status: 20 years married Children/Grandchildren: 2 daughters. 15y, 19y, No grandkids.    Living Situation: 3 in the home.    Daily Activities/Hobbies: Play games with the family.      SUMMARY / CLINICAL IMPRESSIONS The patient was referred for neuropsychological evaluation by her neurologist, Dr. Chalice, due to concerns for declines in short term memory per 08/15/24 visit notes. Records indicate the patient described difficulties with memory in work and at home. Word finding was also endorsed. During her visit with Dr. Chalice, the patient presented the report of a psychoeduational evaluation she underwent around 2000. In today's visit, she indicated  she is trying to find the report, having forgotten where she placed it  for safekeeping. She did provide the 504 plan that presumably resulted from the report. The patient will try and locate the original report and provide a copy to the writer when possible. The below information was obtained via records provided by the patient along with information obtained by Dr. Chalice during her recent follow-up visit.   Patient developmental history is notable for a speech and language delay. She was seen at [facility] at 44 years of age. Speech articulate errors were noted. She benefited from several years of speech and language therapy. The summary and impressions section of a 1988 neurodevelopmental evaluation with Dr. Marshall Forward was reviewed. Dr. Forward indicated absence of distraction/inattention behaviors, difficulties remembering 1-2 step instructions. She had difficulties with auditory verbal memory, and relative strengths in visual memory. Difficulties with speech articulation problems were noted. Motor planning and motor memory problems were present in the absence of fine motor skill deficits, accounting for her difficulties with writing speed. The report offered that problems with memory, difficulty following directions, and auditory-verbal comprehension was driven by auditory processing deficits rather than an attention deficit disorder. School accommodations were recommended to written output bypass strategies, visual cueing, preferential seating, extended time due to written output problems etc. Records indicated plan for her to return for psychoeducational testing. Summary and recommendations of that later testing stated cognitive functioning was within average/low average range, language naming deficits were seen, motor functioning were age-appropriate overall. She was reported to not meet criteria for formal placement in a program for children with learning disability, but tutoring and speech and language therapy were recommended. 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 Difficulties with writing, analysis and synthesis, and oral response in timed condition in the class were reported areas of difficulty. Processing speed was reportedly reduced. Recommendations involved presentation of information in combination oral and written format. Increased or un-timed testing length, ability to audio record lectures, utilizing typing over written format for papers, and guidance/support for long term projects were also recommended.   Current diagnosis listed in the patient's medical chart include, but are not limited to, auditory processing disorder, OSA (biPAP compliant), idiopathic hypersomnia, fibromyalgia, major depressive disorder, hypertension, hyperlipidemia, and perimenopause.   Upon interview, the patient indicated she was interested in undergoing neuropsychological evaluation due to concerns for progressive worsening of premorbid cognitive deficits. She was particularly troubled by short-term memory problems and cited instances in which she failed to recall events such as conversations in which she took part. Formal cognitive evaluation is indicated given the patient's neurological history and concerns for cognitive decline. Results will guide differential diagnosis and treatment recommendations. Cognitive strategies and recommendations will be tailored to the patient's cognitive profile.   If full copy of the 2000 psychoeducation evaluation is located by the patient, comparison of performances will be conducted. Timing of the testing itself was discussed with the patient and a plan made involving flexibility in testing session days to reduce potential interference from hypersomnia.   DISPOSITION / PLAN The patient has been set up for a formal neuropsychological assessment to objectively assess her cognitive functioning across domains to establish the patient's cognitive profile. This  data, in conjunction with information obtained via clinical interview and medical record review, will help clarify likely etiology and guide treatment recommendations. Once data collection and interpretation have been completed, the findings / diagnosis and  recommendations will be reviewed and discussed with the patient during a feedback appointment with the neuropsychologist. Based on the collaborative dialogue with the patient during the feedback, recommendations may be adjusted / tailored as needed. A formal report will be produced and provided to the patient and the referring provider.   Diagnosis: Cognitive Changes  Per Hx: Hypersomnia, persistent [G47.10 (ICD-10-CM)] Auditory processing disorder [Y06.74 (ICD-10-CM)]  FULL REPORT TO FOLLOW   Evalene DOROTHA Riff, PsyD [facility] [address] [place], [place] [address] Main: [phone] Fax: [phone] [facility] License # [id number]  This report was generated using voice recognition software. While this document has been carefully reviewed, transcription errors may be present. I apologize in advance for any inconvenience. Please contact me if further clarification is needed.    "

## 2024-12-09 NOTE — Addendum Note (Signed)
 Addended byBETHA HAYDEN LYE on: 12/09/2024 09:39 AM   Modules accepted: Level of Service

## 2024-12-13 NOTE — Addendum Note (Signed)
 Addended byBETHA HAYDEN LYE on: 12/13/2024 09:53 AM   Modules accepted: Level of Service

## 2024-12-18 ENCOUNTER — Other Ambulatory Visit: Payer: Self-pay | Admitting: Physician Assistant

## 2025-01-22 ENCOUNTER — Ambulatory Visit: Admitting: Physician Assistant

## 2025-02-20 ENCOUNTER — Ambulatory Visit: Admitting: Adult Health

## 2025-07-02 ENCOUNTER — Ambulatory Visit: Admitting: Adult Health
